# Patient Record
Sex: Female | Born: 1945 | Race: White | Hispanic: No | Marital: Married | Smoking: Former smoker
Health system: Southern US, Community
[De-identification: ages and names within clinical notes are randomized; demographics above are authoritative.]

## PROBLEM LIST (undated history)

## (undated) DIAGNOSIS — N83209 Unspecified ovarian cyst, unspecified side: Secondary | ICD-10-CM

## (undated) DIAGNOSIS — F419 Anxiety disorder, unspecified: Secondary | ICD-10-CM

## (undated) DIAGNOSIS — G919 Hydrocephalus, unspecified: Secondary | ICD-10-CM

## (undated) DIAGNOSIS — J45909 Unspecified asthma, uncomplicated: Secondary | ICD-10-CM

## (undated) DIAGNOSIS — F329 Major depressive disorder, single episode, unspecified: Secondary | ICD-10-CM

## (undated) DIAGNOSIS — K219 Gastro-esophageal reflux disease without esophagitis: Secondary | ICD-10-CM

## (undated) DIAGNOSIS — E079 Disorder of thyroid, unspecified: Secondary | ICD-10-CM

## (undated) DIAGNOSIS — M810 Age-related osteoporosis without current pathological fracture: Secondary | ICD-10-CM

## (undated) DIAGNOSIS — T7840XA Allergy, unspecified, initial encounter: Secondary | ICD-10-CM

## (undated) DIAGNOSIS — B009 Herpesviral infection, unspecified: Secondary | ICD-10-CM

## (undated) DIAGNOSIS — F32A Depression, unspecified: Secondary | ICD-10-CM

## (undated) DIAGNOSIS — R7989 Other specified abnormal findings of blood chemistry: Secondary | ICD-10-CM

## (undated) DIAGNOSIS — M1990 Unspecified osteoarthritis, unspecified site: Secondary | ICD-10-CM

## (undated) DIAGNOSIS — Z9889 Other specified postprocedural states: Secondary | ICD-10-CM

## (undated) DIAGNOSIS — L400 Psoriasis vulgaris: Secondary | ICD-10-CM

## (undated) DIAGNOSIS — B029 Zoster without complications: Secondary | ICD-10-CM

## (undated) DIAGNOSIS — D802 Selective deficiency of immunoglobulin A [IgA]: Secondary | ICD-10-CM

## (undated) DIAGNOSIS — E538 Deficiency of other specified B group vitamins: Secondary | ICD-10-CM

## (undated) HISTORY — DX: Herpesviral infection, unspecified: B00.9

## (undated) HISTORY — DX: Selective deficiency of immunoglobulin a (iga): D80.2

## (undated) HISTORY — DX: Other specified abnormal findings of blood chemistry: R79.89

## (undated) HISTORY — PX: BRAIN SURGERY: SHX531

## (undated) HISTORY — DX: Zoster without complications: B02.9

## (undated) HISTORY — DX: Unspecified osteoarthritis, unspecified site: M19.90

## (undated) HISTORY — DX: Anxiety disorder, unspecified: F41.9

## (undated) HISTORY — DX: Age-related osteoporosis without current pathological fracture: M81.0

## (undated) HISTORY — PX: OTHER SURGICAL HISTORY: SHX169

## (undated) HISTORY — PX: UPPER GASTROINTESTINAL ENDOSCOPY: SHX188

## (undated) HISTORY — DX: Depression, unspecified: F32.A

## (undated) HISTORY — PX: LUMBAR DISC SURGERY: SHX700

## (undated) HISTORY — DX: Gastro-esophageal reflux disease without esophagitis: K21.9

## (undated) HISTORY — DX: Hydrocephalus, unspecified: G91.9

## (undated) HISTORY — DX: Allergy, unspecified, initial encounter: T78.40XA

## (undated) HISTORY — DX: Disorder of thyroid, unspecified: E07.9

## (undated) HISTORY — PX: COLONOSCOPY: SHX174

## (undated) HISTORY — DX: Unspecified ovarian cyst, unspecified side: N83.209

## (undated) HISTORY — DX: Psoriasis vulgaris: L40.0

## (undated) HISTORY — DX: Other specified postprocedural states: Z98.890

## (undated) HISTORY — DX: Unspecified asthma, uncomplicated: J45.909

## (undated) HISTORY — DX: Major depressive disorder, single episode, unspecified: F32.9

## (undated) HISTORY — DX: Deficiency of other specified B group vitamins: E53.8

## (undated) MED FILL — Medication: Fill #0 | Status: CN

---

## 1997-08-24 HISTORY — PX: TOTAL ABDOMINAL HYSTERECTOMY W/ BILATERAL SALPINGOOPHORECTOMY: SHX83

## 1997-11-27 ENCOUNTER — Encounter: Admission: RE | Admit: 1997-11-27 | Discharge: 1997-11-27 | Payer: Self-pay | Admitting: Sports Medicine

## 1998-02-12 ENCOUNTER — Encounter: Admission: RE | Admit: 1998-02-12 | Discharge: 1998-02-12 | Payer: Self-pay | Admitting: Family Medicine

## 1998-04-24 ENCOUNTER — Encounter: Admission: RE | Admit: 1998-04-24 | Discharge: 1998-04-24 | Payer: Self-pay | Admitting: Family Medicine

## 1998-08-24 HISTORY — PX: THYROIDECTOMY: SHX17

## 1998-10-08 ENCOUNTER — Other Ambulatory Visit: Admission: RE | Admit: 1998-10-08 | Discharge: 1998-10-08 | Payer: Self-pay | Admitting: Obstetrics and Gynecology

## 1998-10-16 ENCOUNTER — Ambulatory Visit: Admission: RE | Admit: 1998-10-16 | Discharge: 1998-10-16 | Payer: Self-pay | Admitting: Gynecology

## 1999-04-03 ENCOUNTER — Encounter: Payer: Self-pay | Admitting: Gastroenterology

## 1999-05-02 ENCOUNTER — Encounter: Payer: Self-pay | Admitting: Gastroenterology

## 1999-05-02 ENCOUNTER — Ambulatory Visit (HOSPITAL_COMMUNITY): Admission: RE | Admit: 1999-05-02 | Discharge: 1999-05-02 | Payer: Self-pay | Admitting: Gastroenterology

## 1999-08-05 ENCOUNTER — Ambulatory Visit (HOSPITAL_COMMUNITY): Admission: RE | Admit: 1999-08-05 | Discharge: 1999-08-05 | Payer: Self-pay | Admitting: Surgery

## 1999-08-05 ENCOUNTER — Encounter: Payer: Self-pay | Admitting: Surgery

## 1999-08-05 ENCOUNTER — Encounter (INDEPENDENT_AMBULATORY_CARE_PROVIDER_SITE_OTHER): Payer: Self-pay | Admitting: Specialist

## 1999-08-19 ENCOUNTER — Encounter (INDEPENDENT_AMBULATORY_CARE_PROVIDER_SITE_OTHER): Payer: Self-pay | Admitting: *Deleted

## 1999-08-19 ENCOUNTER — Ambulatory Visit (HOSPITAL_COMMUNITY): Admission: RE | Admit: 1999-08-19 | Discharge: 1999-08-20 | Payer: Self-pay | Admitting: *Deleted

## 1999-09-15 ENCOUNTER — Encounter: Payer: Self-pay | Admitting: Family Medicine

## 1999-09-15 ENCOUNTER — Encounter: Admission: RE | Admit: 1999-09-15 | Discharge: 1999-09-15 | Payer: Self-pay | Admitting: Family Medicine

## 1999-11-10 ENCOUNTER — Other Ambulatory Visit: Admission: RE | Admit: 1999-11-10 | Discharge: 1999-11-10 | Payer: Self-pay | Admitting: Obstetrics and Gynecology

## 2000-02-18 ENCOUNTER — Encounter: Admission: RE | Admit: 2000-02-18 | Discharge: 2000-02-18 | Payer: Self-pay | Admitting: Family Medicine

## 2000-07-30 ENCOUNTER — Encounter: Admission: RE | Admit: 2000-07-30 | Discharge: 2000-07-30 | Payer: Self-pay | Admitting: Obstetrics and Gynecology

## 2000-07-30 ENCOUNTER — Encounter: Payer: Self-pay | Admitting: Obstetrics and Gynecology

## 2000-09-13 ENCOUNTER — Encounter: Admission: RE | Admit: 2000-09-13 | Discharge: 2000-09-13 | Payer: Self-pay | Admitting: Family Medicine

## 2000-09-20 ENCOUNTER — Encounter: Admission: RE | Admit: 2000-09-20 | Discharge: 2000-09-20 | Payer: Self-pay | Admitting: Family Medicine

## 2000-09-20 ENCOUNTER — Encounter: Payer: Self-pay | Admitting: Family Medicine

## 2000-10-21 ENCOUNTER — Encounter: Admission: RE | Admit: 2000-10-21 | Discharge: 2000-10-21 | Payer: Self-pay | Admitting: Family Medicine

## 2001-01-13 ENCOUNTER — Encounter: Admission: RE | Admit: 2001-01-13 | Discharge: 2001-01-13 | Payer: Self-pay | Admitting: Family Medicine

## 2001-04-20 ENCOUNTER — Ambulatory Visit (HOSPITAL_BASED_OUTPATIENT_CLINIC_OR_DEPARTMENT_OTHER): Admission: RE | Admit: 2001-04-20 | Discharge: 2001-04-21 | Payer: Self-pay | Admitting: *Deleted

## 2001-04-20 ENCOUNTER — Encounter: Payer: Self-pay | Admitting: *Deleted

## 2001-09-22 ENCOUNTER — Encounter: Payer: Self-pay | Admitting: Obstetrics and Gynecology

## 2001-09-22 ENCOUNTER — Encounter: Admission: RE | Admit: 2001-09-22 | Discharge: 2001-09-22 | Payer: Self-pay | Admitting: Obstetrics and Gynecology

## 2001-10-07 ENCOUNTER — Encounter: Payer: Self-pay | Admitting: Gastroenterology

## 2001-10-31 ENCOUNTER — Encounter: Payer: Self-pay | Admitting: Gastroenterology

## 2001-10-31 ENCOUNTER — Ambulatory Visit (HOSPITAL_COMMUNITY): Admission: RE | Admit: 2001-10-31 | Discharge: 2001-10-31 | Payer: Self-pay | Admitting: Gastroenterology

## 2002-02-10 ENCOUNTER — Encounter: Admission: RE | Admit: 2002-02-10 | Discharge: 2002-02-10 | Payer: Self-pay | Admitting: Family Medicine

## 2002-02-21 ENCOUNTER — Encounter: Payer: Self-pay | Admitting: Obstetrics and Gynecology

## 2002-02-21 ENCOUNTER — Encounter: Admission: RE | Admit: 2002-02-21 | Discharge: 2002-02-21 | Payer: Self-pay | Admitting: Obstetrics and Gynecology

## 2003-02-01 ENCOUNTER — Encounter: Admission: RE | Admit: 2003-02-01 | Discharge: 2003-02-01 | Payer: Self-pay | Admitting: Obstetrics and Gynecology

## 2003-02-01 ENCOUNTER — Encounter: Payer: Self-pay | Admitting: Obstetrics and Gynecology

## 2003-02-13 ENCOUNTER — Encounter: Admission: RE | Admit: 2003-02-13 | Discharge: 2003-02-13 | Payer: Self-pay | Admitting: Family Medicine

## 2004-02-28 ENCOUNTER — Encounter: Admission: RE | Admit: 2004-02-28 | Discharge: 2004-02-28 | Payer: Self-pay | Admitting: Family Medicine

## 2004-03-11 ENCOUNTER — Encounter: Admission: RE | Admit: 2004-03-11 | Discharge: 2004-03-11 | Payer: Self-pay | Admitting: Family Medicine

## 2004-03-12 ENCOUNTER — Encounter: Admission: RE | Admit: 2004-03-12 | Discharge: 2004-03-12 | Payer: Self-pay | Admitting: Obstetrics and Gynecology

## 2004-09-12 ENCOUNTER — Ambulatory Visit (HOSPITAL_COMMUNITY): Admission: RE | Admit: 2004-09-12 | Discharge: 2004-09-12 | Payer: Self-pay | Admitting: Gastroenterology

## 2004-09-12 ENCOUNTER — Encounter (INDEPENDENT_AMBULATORY_CARE_PROVIDER_SITE_OTHER): Payer: Self-pay | Admitting: *Deleted

## 2004-09-12 ENCOUNTER — Encounter: Payer: Self-pay | Admitting: Gastroenterology

## 2004-09-18 ENCOUNTER — Encounter: Admission: RE | Admit: 2004-09-18 | Discharge: 2004-09-18 | Payer: Self-pay | Admitting: Obstetrics and Gynecology

## 2005-04-03 ENCOUNTER — Encounter: Admission: RE | Admit: 2005-04-03 | Discharge: 2005-04-03 | Payer: Self-pay | Admitting: Obstetrics and Gynecology

## 2005-04-15 ENCOUNTER — Ambulatory Visit: Payer: Self-pay | Admitting: Family Medicine

## 2005-09-15 ENCOUNTER — Ambulatory Visit: Payer: Self-pay | Admitting: Family Medicine

## 2005-09-29 ENCOUNTER — Ambulatory Visit: Payer: Self-pay | Admitting: Family Medicine

## 2005-10-09 ENCOUNTER — Ambulatory Visit: Payer: Self-pay | Admitting: Internal Medicine

## 2005-10-28 ENCOUNTER — Ambulatory Visit: Payer: Self-pay | Admitting: Family Medicine

## 2005-12-21 ENCOUNTER — Ambulatory Visit: Payer: Self-pay | Admitting: Family Medicine

## 2006-03-02 ENCOUNTER — Ambulatory Visit: Payer: Self-pay | Admitting: Family Medicine

## 2006-03-25 ENCOUNTER — Ambulatory Visit: Payer: Self-pay | Admitting: Family Medicine

## 2006-03-31 ENCOUNTER — Ambulatory Visit: Payer: Self-pay | Admitting: Family Medicine

## 2006-04-05 ENCOUNTER — Encounter: Admission: RE | Admit: 2006-04-05 | Discharge: 2006-04-05 | Payer: Self-pay | Admitting: Family Medicine

## 2006-10-21 DIAGNOSIS — J45909 Unspecified asthma, uncomplicated: Secondary | ICD-10-CM | POA: Insufficient documentation

## 2006-10-21 DIAGNOSIS — M810 Age-related osteoporosis without current pathological fracture: Secondary | ICD-10-CM | POA: Insufficient documentation

## 2006-10-21 DIAGNOSIS — F32A Depression, unspecified: Secondary | ICD-10-CM | POA: Insufficient documentation

## 2006-10-21 DIAGNOSIS — M8580 Other specified disorders of bone density and structure, unspecified site: Secondary | ICD-10-CM | POA: Insufficient documentation

## 2006-10-21 DIAGNOSIS — K649 Unspecified hemorrhoids: Secondary | ICD-10-CM | POA: Insufficient documentation

## 2006-10-21 DIAGNOSIS — F329 Major depressive disorder, single episode, unspecified: Secondary | ICD-10-CM

## 2007-01-05 ENCOUNTER — Ambulatory Visit: Payer: Self-pay | Admitting: Internal Medicine

## 2007-01-24 ENCOUNTER — Ambulatory Visit: Payer: Self-pay | Admitting: Family Medicine

## 2007-02-04 ENCOUNTER — Ambulatory Visit: Payer: Self-pay | Admitting: Family Medicine

## 2007-03-18 ENCOUNTER — Ambulatory Visit: Payer: Self-pay | Admitting: Family Medicine

## 2007-03-18 LAB — CONVERTED CEMR LAB
ALT: 21 units/L (ref 0–35)
AST: 21 units/L (ref 0–37)
Albumin: 3.7 g/dL (ref 3.5–5.2)
Alkaline Phosphatase: 54 units/L (ref 39–117)
BUN: 10 mg/dL (ref 6–23)
Basophils Absolute: 0 10*3/uL (ref 0.0–0.1)
Basophils Relative: 0.7 % (ref 0.0–1.0)
Bilirubin Urine: NEGATIVE
Bilirubin, Direct: 0.1 mg/dL (ref 0.0–0.3)
Blood in Urine, dipstick: NEGATIVE
CO2: 33 meq/L — ABNORMAL HIGH (ref 19–32)
Calcium: 8.8 mg/dL (ref 8.4–10.5)
Chloride: 103 meq/L (ref 96–112)
Cholesterol: 172 mg/dL (ref 0–200)
Creatinine, Ser: 0.8 mg/dL (ref 0.4–1.2)
Eosinophils Absolute: 0.2 10*3/uL (ref 0.0–0.6)
Eosinophils Relative: 4.3 % (ref 0.0–5.0)
GFR calc Af Amer: 94 mL/min
GFR calc non Af Amer: 78 mL/min
Glucose, Bld: 89 mg/dL (ref 70–99)
Glucose, Urine, Semiquant: NEGATIVE
HCT: 38 % (ref 36.0–46.0)
HDL: 46.5 mg/dL (ref >39.0)
Hemoglobin: 13.3 g/dL (ref 12.0–15.0)
Ketones, urine, test strip: NEGATIVE
LDL Cholesterol: 95 mg/dL (ref 0–99)
Lymphocytes Relative: 26.6 % (ref 12.0–46.0)
MCHC: 35.1 g/dL (ref 30.0–36.0)
MCV: 95.3 fL (ref 78.0–100.0)
Monocytes Absolute: 0.8 10*3/uL — ABNORMAL HIGH (ref 0.2–0.7)
Monocytes Relative: 15.6 % — ABNORMAL HIGH (ref 3.0–11.0)
Neutro Abs: 2.7 10*3/uL (ref 1.4–7.7)
Neutrophils Relative %: 52.8 % (ref 43.0–77.0)
Nitrite: NEGATIVE
Platelets: 264 10*3/uL (ref 150–400)
Potassium: 4.2 meq/L (ref 3.5–5.1)
Protein, U semiquant: NEGATIVE
RBC: 3.98 M/uL (ref 3.87–5.11)
RDW: 12.7 % (ref 11.5–14.6)
Sodium: 140 meq/L (ref 135–145)
Specific Gravity, Urine: 1.015
TSH: 3.15 microintl units/mL (ref 0.35–5.50)
Total Bilirubin: 0.7 mg/dL (ref 0.3–1.2)
Total CHOL/HDL Ratio: 3.7
Total Protein: 6.2 g/dL (ref 6.0–8.3)
Triglycerides: 153 mg/dL — ABNORMAL HIGH (ref 0–149)
Urobilinogen, UA: NEGATIVE
VLDL: 31 mg/dL (ref 0–40)
WBC: 5.1 10*3/uL (ref 4.5–10.5)
pH: 7.5

## 2007-03-25 ENCOUNTER — Ambulatory Visit: Payer: Self-pay | Admitting: Family Medicine

## 2007-03-30 ENCOUNTER — Ambulatory Visit (HOSPITAL_BASED_OUTPATIENT_CLINIC_OR_DEPARTMENT_OTHER): Admission: RE | Admit: 2007-03-30 | Discharge: 2007-03-30 | Payer: Self-pay | Admitting: *Deleted

## 2007-04-11 ENCOUNTER — Encounter: Admission: RE | Admit: 2007-04-11 | Discharge: 2007-04-11 | Payer: Self-pay | Admitting: Obstetrics & Gynecology

## 2007-04-11 ENCOUNTER — Encounter: Payer: Self-pay | Admitting: Family Medicine

## 2007-08-24 ENCOUNTER — Ambulatory Visit: Payer: Self-pay | Admitting: Family Medicine

## 2007-09-05 ENCOUNTER — Ambulatory Visit: Payer: Self-pay | Admitting: Family Medicine

## 2007-09-05 DIAGNOSIS — J309 Allergic rhinitis, unspecified: Secondary | ICD-10-CM | POA: Insufficient documentation

## 2007-09-05 DIAGNOSIS — E039 Hypothyroidism, unspecified: Secondary | ICD-10-CM | POA: Insufficient documentation

## 2007-09-23 ENCOUNTER — Ambulatory Visit: Payer: Self-pay | Admitting: Family Medicine

## 2007-09-23 LAB — CONVERTED CEMR LAB
ALT: 29 units/L (ref 0–35)
AST: 24 units/L (ref 0–37)
Albumin: 3.7 g/dL (ref 3.5–5.2)
Alkaline Phosphatase: 53 units/L (ref 39–117)
Anti Nuclear Antibody(ANA): NEGATIVE
BUN: 12 mg/dL (ref 6–23)
Basophils Absolute: 0 10*3/uL (ref 0.0–0.1)
Basophils Relative: 0 % (ref 0.0–1.0)
Bilirubin, Direct: 0.2 mg/dL (ref 0.0–0.3)
CO2: 31 meq/L (ref 19–32)
Calcium: 9.2 mg/dL (ref 8.4–10.5)
Chloride: 101 meq/L (ref 96–112)
Creatinine, Ser: 0.7 mg/dL (ref 0.4–1.2)
Eosinophils Absolute: 0.3 10*3/uL (ref 0.0–0.6)
Eosinophils Relative: 2.3 % (ref 0.0–5.0)
GFR calc Af Amer: 110 mL/min
GFR calc non Af Amer: 91 mL/min
Glucose, Bld: 82 mg/dL (ref 70–99)
HCT: 37.7 % (ref 36.0–46.0)
Hemoglobin: 12.9 g/dL (ref 12.0–15.0)
Lymphocytes Relative: 8.3 % — ABNORMAL LOW (ref 12.0–46.0)
MCHC: 34.1 g/dL (ref 30.0–36.0)
MCV: 91.1 fL (ref 78.0–100.0)
Monocytes Absolute: 0.9 10*3/uL — ABNORMAL HIGH (ref 0.2–0.7)
Monocytes Relative: 7.9 % (ref 3.0–11.0)
Neutro Abs: 9.2 10*3/uL — ABNORMAL HIGH (ref 1.4–7.7)
Neutrophils Relative %: 81.5 % — ABNORMAL HIGH (ref 43.0–77.0)
Platelets: 261 10*3/uL (ref 150–400)
Potassium: 4.4 meq/L (ref 3.5–5.1)
RBC: 4.14 M/uL (ref 3.87–5.11)
RDW: 12.9 % (ref 11.5–14.6)
Rhuematoid fact SerPl-aCnc: <20 intl units/mL — ABNORMAL LOW (ref 0.0–20.0)
Sodium: 139 meq/L (ref 135–145)
TSH: 0.08 microintl units/mL — ABNORMAL LOW (ref 0.35–5.50)
Total Bilirubin: 0.8 mg/dL (ref 0.3–1.2)
Total CK: 79 units/L (ref 7–177)
Total Protein: 6 g/dL (ref 6.0–8.3)
WBC: 11.3 10*3/uL — ABNORMAL HIGH (ref 4.5–10.5)

## 2007-09-30 ENCOUNTER — Ambulatory Visit: Payer: Self-pay | Admitting: Family Medicine

## 2007-10-03 ENCOUNTER — Encounter: Payer: Self-pay | Admitting: Family Medicine

## 2007-11-21 ENCOUNTER — Ambulatory Visit: Payer: Self-pay | Admitting: Family Medicine

## 2007-11-28 ENCOUNTER — Ambulatory Visit: Payer: Self-pay | Admitting: Family Medicine

## 2007-11-30 LAB — CONVERTED CEMR LAB: TSH: 0.85 microintl units/mL (ref 0.35–5.50)

## 2008-03-26 ENCOUNTER — Ambulatory Visit: Payer: Self-pay | Admitting: Family Medicine

## 2008-03-26 LAB — CONVERTED CEMR LAB
ALT: 18 units/L (ref 0–35)
AST: 22 units/L (ref 0–37)
Albumin: 4.2 g/dL (ref 3.5–5.2)
Alkaline Phosphatase: 74 units/L (ref 39–117)
BUN: 11 mg/dL (ref 6–23)
Basophils Absolute: 0 10*3/uL (ref 0.0–0.1)
Basophils Relative: 0.9 % (ref 0.0–3.0)
Bilirubin Urine: NEGATIVE
Bilirubin, Direct: 0.1 mg/dL (ref 0.0–0.3)
CO2: 32 meq/L (ref 19–32)
Calcium: 9.3 mg/dL (ref 8.4–10.5)
Chloride: 102 meq/L (ref 96–112)
Cholesterol: 202 mg/dL (ref 0–200)
Creatinine, Ser: 0.9 mg/dL (ref 0.4–1.2)
Direct LDL: 110.8 mg/dL
Eosinophils Absolute: 0.3 10*3/uL (ref 0.0–0.7)
Eosinophils Relative: 6.2 % — ABNORMAL HIGH (ref 0.0–5.0)
GFR calc Af Amer: 82 mL/min
GFR calc non Af Amer: 68 mL/min
Glucose, Bld: 94 mg/dL (ref 70–99)
Glucose, Urine, Semiquant: NEGATIVE
HCT: 40.5 % (ref 36.0–46.0)
HDL: 71.2 mg/dL (ref >39.0)
Hemoglobin: 14.2 g/dL (ref 12.0–15.0)
Ketones, urine, test strip: NEGATIVE
Lymphocytes Relative: 20.4 % (ref 12.0–46.0)
MCHC: 35.2 g/dL (ref 30.0–36.0)
MCV: 93.5 fL (ref 78.0–100.0)
Monocytes Absolute: 0.5 10*3/uL (ref 0.1–1.0)
Monocytes Relative: 10.6 % (ref 3.0–12.0)
Neutro Abs: 2.9 10*3/uL (ref 1.4–7.7)
Neutrophils Relative %: 61.9 % (ref 43.0–77.0)
Nitrite: NEGATIVE
Platelets: 280 10*3/uL (ref 150–400)
Potassium: 4.2 meq/L (ref 3.5–5.1)
Protein, U semiquant: NEGATIVE
RBC: 4.33 M/uL (ref 3.87–5.11)
RDW: 12.7 % (ref 11.5–14.6)
Sodium: 138 meq/L (ref 135–145)
Specific Gravity, Urine: 1.015
TSH: 0.74 microintl units/mL (ref 0.35–5.50)
Total Bilirubin: 1 mg/dL (ref 0.3–1.2)
Total CHOL/HDL Ratio: 2.8
Total Protein: 7.1 g/dL (ref 6.0–8.3)
Triglycerides: 60 mg/dL (ref 0–149)
Urobilinogen, UA: 0.2
VLDL: 12 mg/dL (ref 0–40)
WBC: 4.6 10*3/uL (ref 4.5–10.5)
pH: 6.5

## 2008-04-02 ENCOUNTER — Encounter: Payer: Self-pay | Admitting: Family Medicine

## 2008-04-03 ENCOUNTER — Ambulatory Visit: Payer: Self-pay | Admitting: Family Medicine

## 2008-04-18 ENCOUNTER — Encounter: Admission: RE | Admit: 2008-04-18 | Discharge: 2008-04-18 | Payer: Self-pay | Admitting: Obstetrics and Gynecology

## 2008-08-13 ENCOUNTER — Ambulatory Visit: Payer: Self-pay | Admitting: Family Medicine

## 2008-10-26 ENCOUNTER — Telehealth: Payer: Self-pay | Admitting: Internal Medicine

## 2008-11-09 ENCOUNTER — Telehealth: Payer: Self-pay | Admitting: Family Medicine

## 2008-11-13 ENCOUNTER — Ambulatory Visit: Payer: Self-pay | Admitting: Family Medicine

## 2008-11-13 LAB — CONVERTED CEMR LAB
ALT: 16 units/L (ref 0–35)
ANA Titer 1: NEGATIVE
AST: 21 units/L (ref 0–37)
Albumin: 3.9 g/dL (ref 3.5–5.2)
Alkaline Phosphatase: 69 units/L (ref 39–117)
Anti Nuclear Antibody(ANA): POSITIVE — AB
BUN: 13 mg/dL (ref 6–23)
Basophils Absolute: 0 10*3/uL (ref 0.0–0.1)
Basophils Relative: 0.6 % (ref 0.0–3.0)
Bilirubin, Direct: 0 mg/dL (ref 0.0–0.3)
CO2: 30 meq/L (ref 19–32)
Calcium: 8.8 mg/dL (ref 8.4–10.5)
Chloride: 108 meq/L (ref 96–112)
Creatinine, Ser: 0.8 mg/dL (ref 0.4–1.2)
Cyclic Citrullin Peptide Ab: 0.5 units (ref <7)
Eosinophils Absolute: 0.3 10*3/uL (ref 0.0–0.7)
Eosinophils Relative: 4.1 % (ref 0.0–5.0)
GFR calc non Af Amer: 77.58 mL/min (ref >60)
Glucose, Bld: 76 mg/dL (ref 70–99)
HCT: 39.1 % (ref 36.0–46.0)
Hemoglobin: 13.5 g/dL (ref 12.0–15.0)
Lymphocytes Relative: 17 % (ref 12.0–46.0)
Lymphs Abs: 1.1 10*3/uL (ref 0.7–4.0)
MCHC: 34.6 g/dL (ref 30.0–36.0)
MCV: 94.4 fL (ref 78.0–100.0)
Monocytes Absolute: 0.6 10*3/uL (ref 0.1–1.0)
Monocytes Relative: 8.9 % (ref 3.0–12.0)
Neutro Abs: 4.6 10*3/uL (ref 1.4–7.7)
Neutrophils Relative %: 69.4 % (ref 43.0–77.0)
Platelets: 257 10*3/uL (ref 150.0–400.0)
Potassium: 4.1 meq/L (ref 3.5–5.1)
RBC: 4.14 M/uL (ref 3.87–5.11)
RDW: 12.2 % (ref 11.5–14.6)
Rhuematoid fact SerPl-aCnc: <20 intl units/mL (ref 0.0–20.0)
Sed Rate: 13 mm/hr (ref 0–22)
Sodium: 142 meq/L (ref 135–145)
TSH: 0.12 microintl units/mL — ABNORMAL LOW (ref 0.35–5.50)
Total Bilirubin: 0.6 mg/dL (ref 0.3–1.2)
Total Protein: 6.5 g/dL (ref 6.0–8.3)
Uric Acid, Serum: 3.6 mg/dL (ref 2.4–7.0)
WBC: 6.6 10*3/uL (ref 4.5–10.5)

## 2008-11-16 ENCOUNTER — Ambulatory Visit: Payer: Self-pay | Admitting: Family Medicine

## 2008-12-27 ENCOUNTER — Ambulatory Visit: Payer: Self-pay | Admitting: Family Medicine

## 2008-12-27 LAB — CONVERTED CEMR LAB: TSH: 1.92 microintl units/mL (ref 0.35–5.50)

## 2009-01-16 ENCOUNTER — Encounter: Payer: Self-pay | Admitting: Family Medicine

## 2009-03-28 ENCOUNTER — Ambulatory Visit: Payer: Self-pay | Admitting: Family Medicine

## 2009-03-28 ENCOUNTER — Encounter: Payer: Self-pay | Admitting: Family Medicine

## 2009-03-28 ENCOUNTER — Telehealth: Payer: Self-pay | Admitting: *Deleted

## 2009-03-28 DIAGNOSIS — E049 Nontoxic goiter, unspecified: Secondary | ICD-10-CM | POA: Insufficient documentation

## 2009-03-29 LAB — CONVERTED CEMR LAB
ALT: 14 units/L (ref 0–35)
AST: 19 units/L (ref 0–37)
Albumin: 4.1 g/dL (ref 3.5–5.2)
Alkaline Phosphatase: 58 units/L (ref 39–117)
BUN: 15 mg/dL (ref 6–23)
Basophils Absolute: 0 10*3/uL (ref 0.0–0.1)
Basophils Relative: 0.4 % (ref 0.0–3.0)
Bilirubin, Direct: 0 mg/dL (ref 0.0–0.3)
CO2: 30 meq/L (ref 19–32)
Calcium: 9.5 mg/dL (ref 8.4–10.5)
Chloride: 101 meq/L (ref 96–112)
Cholesterol: 190 mg/dL (ref 0–200)
Creatinine, Ser: 0.8 mg/dL (ref 0.4–1.2)
Eosinophils Absolute: 0.3 10*3/uL (ref 0.0–0.7)
Eosinophils Relative: 6.3 % — ABNORMAL HIGH (ref 0.0–5.0)
Free T4: 0.8 ng/dL (ref 0.6–1.6)
GFR calc non Af Amer: 77.48 mL/min (ref >60)
Glucose, Bld: 93 mg/dL (ref 70–99)
HCT: 39.2 % (ref 36.0–46.0)
HDL: 61.6 mg/dL (ref >39.00)
Hemoglobin: 14 g/dL (ref 12.0–15.0)
LDL Cholesterol: 113 mg/dL — ABNORMAL HIGH (ref 0–99)
Lymphocytes Relative: 23.9 % (ref 12.0–46.0)
Lymphs Abs: 1.1 10*3/uL (ref 0.7–4.0)
MCHC: 35.7 g/dL (ref 30.0–36.0)
MCV: 95.1 fL (ref 78.0–100.0)
Monocytes Absolute: 0.6 10*3/uL (ref 0.1–1.0)
Monocytes Relative: 12.7 % — ABNORMAL HIGH (ref 3.0–12.0)
Neutro Abs: 2.8 10*3/uL (ref 1.4–7.7)
Neutrophils Relative %: 56.7 % (ref 43.0–77.0)
Platelets: 236 10*3/uL (ref 150.0–400.0)
Potassium: 5.2 meq/L — ABNORMAL HIGH (ref 3.5–5.1)
RBC: 4.12 M/uL (ref 3.87–5.11)
RDW: 12.2 % (ref 11.5–14.6)
Sodium: 139 meq/L (ref 135–145)
T3, Free: 2.4 pg/mL (ref 2.3–4.2)
TSH: 1.25 microintl units/mL (ref 0.35–5.50)
Total Bilirubin: 0.8 mg/dL (ref 0.3–1.2)
Total CHOL/HDL Ratio: 3
Total Protein: 7.1 g/dL (ref 6.0–8.3)
Triglycerides: 77 mg/dL (ref 0.0–149.0)
VLDL: 15.4 mg/dL (ref 0.0–40.0)
WBC: 4.8 10*3/uL (ref 4.5–10.5)

## 2009-04-01 ENCOUNTER — Encounter: Admission: RE | Admit: 2009-04-01 | Discharge: 2009-04-01 | Payer: Self-pay | Admitting: Family Medicine

## 2009-04-02 ENCOUNTER — Telehealth: Payer: Self-pay | Admitting: Family Medicine

## 2009-04-18 ENCOUNTER — Ambulatory Visit: Payer: Self-pay | Admitting: Family Medicine

## 2009-05-10 ENCOUNTER — Encounter: Admission: RE | Admit: 2009-05-10 | Discharge: 2009-05-10 | Payer: Self-pay | Admitting: Family Medicine

## 2009-05-13 ENCOUNTER — Telehealth: Payer: Self-pay | Admitting: *Deleted

## 2009-06-20 ENCOUNTER — Ambulatory Visit: Payer: Self-pay | Admitting: Family Medicine

## 2009-06-20 LAB — CONVERTED CEMR LAB
Bilirubin Urine: NEGATIVE
Glucose, Urine, Semiquant: NEGATIVE
Ketones, urine, test strip: NEGATIVE
Nitrite: NEGATIVE
Protein, U semiquant: NEGATIVE
Specific Gravity, Urine: <1.005
Urobilinogen, UA: 0.2
pH: 5

## 2009-06-21 ENCOUNTER — Encounter: Payer: Self-pay | Admitting: Family Medicine

## 2009-10-02 ENCOUNTER — Encounter: Payer: Self-pay | Admitting: Gastroenterology

## 2009-10-18 ENCOUNTER — Ambulatory Visit: Payer: Self-pay | Admitting: Internal Medicine

## 2009-10-18 DIAGNOSIS — D802 Selective deficiency of immunoglobulin A [IgA]: Secondary | ICD-10-CM | POA: Insufficient documentation

## 2010-02-25 ENCOUNTER — Encounter (INDEPENDENT_AMBULATORY_CARE_PROVIDER_SITE_OTHER): Payer: Self-pay | Admitting: *Deleted

## 2010-03-04 ENCOUNTER — Ambulatory Visit: Payer: Self-pay | Admitting: Gastroenterology

## 2010-03-04 ENCOUNTER — Encounter (INDEPENDENT_AMBULATORY_CARE_PROVIDER_SITE_OTHER): Payer: Self-pay | Admitting: *Deleted

## 2010-03-04 DIAGNOSIS — K219 Gastro-esophageal reflux disease without esophagitis: Secondary | ICD-10-CM | POA: Insufficient documentation

## 2010-03-05 DIAGNOSIS — E538 Deficiency of other specified B group vitamins: Secondary | ICD-10-CM | POA: Insufficient documentation

## 2010-03-05 LAB — CONVERTED CEMR LAB
ALT: 14 units/L (ref 0–35)
AST: 20 units/L (ref 0–37)
Albumin: 4 g/dL (ref 3.5–5.2)
Alkaline Phosphatase: 62 units/L (ref 39–117)
BUN: 15 mg/dL (ref 6–23)
Basophils Absolute: 0 10*3/uL (ref 0.0–0.1)
Basophils Relative: 0.5 % (ref 0.0–3.0)
Bilirubin, Direct: 0.1 mg/dL (ref 0.0–0.3)
CO2: 30 meq/L (ref 19–32)
Calcium: 9.3 mg/dL (ref 8.4–10.5)
Chloride: 103 meq/L (ref 96–112)
Creatinine, Ser: 0.8 mg/dL (ref 0.4–1.2)
Eosinophils Absolute: 0.5 10*3/uL (ref 0.0–0.7)
Eosinophils Relative: 8.6 % — ABNORMAL HIGH (ref 0.0–5.0)
Ferritin: 48.1 ng/mL (ref 10.0–291.0)
Folate: 17.7 ng/mL
GFR calc non Af Amer: 74.03 mL/min (ref >60)
Glucose, Bld: 73 mg/dL (ref 70–99)
HCT: 41.9 % (ref 36.0–46.0)
Hemoglobin: 14.4 g/dL (ref 12.0–15.0)
Iron: 118 ug/dL (ref 42–145)
Lymphocytes Relative: 14.6 % (ref 12.0–46.0)
Lymphs Abs: 0.9 10*3/uL (ref 0.7–4.0)
MCHC: 34.3 g/dL (ref 30.0–36.0)
MCV: 95.1 fL (ref 78.0–100.0)
Monocytes Absolute: 0.7 10*3/uL (ref 0.1–1.0)
Monocytes Relative: 11 % (ref 3.0–12.0)
Neutro Abs: 3.9 10*3/uL (ref 1.4–7.7)
Neutrophils Relative %: 65.3 % (ref 43.0–77.0)
Platelets: 254 10*3/uL (ref 150.0–400.0)
Potassium: 4.7 meq/L (ref 3.5–5.1)
RBC: 4.41 M/uL (ref 3.87–5.11)
RDW: 13 % (ref 11.5–14.6)
Saturation Ratios: 29.9 % (ref 20.0–50.0)
Sodium: 138 meq/L (ref 135–145)
TSH: 1.03 microintl units/mL (ref 0.35–5.50)
Total Bilirubin: 0.6 mg/dL (ref 0.3–1.2)
Total Protein: 6.9 g/dL (ref 6.0–8.3)
Transferrin: 281.6 mg/dL (ref 212.0–360.0)
Vitamin B-12: 217 pg/mL (ref 211–911)
WBC: 6 10*3/uL (ref 4.5–10.5)

## 2010-03-06 ENCOUNTER — Telehealth: Payer: Self-pay | Admitting: Gastroenterology

## 2010-03-06 ENCOUNTER — Ambulatory Visit (HOSPITAL_COMMUNITY): Admission: RE | Admit: 2010-03-06 | Discharge: 2010-03-06 | Payer: Self-pay | Admitting: Gastroenterology

## 2010-03-06 ENCOUNTER — Ambulatory Visit: Payer: Self-pay | Admitting: Gastroenterology

## 2010-03-06 LAB — CONVERTED CEMR LAB
IgA: <7 mg/dL — ABNORMAL LOW (ref 68–378)
Tissue Transglutaminase Ab, IgA: 1 units (ref <20)

## 2010-03-07 ENCOUNTER — Ambulatory Visit: Payer: Self-pay | Admitting: Gastroenterology

## 2010-03-07 LAB — CONVERTED CEMR LAB: UREASE: NEGATIVE

## 2010-03-10 ENCOUNTER — Telehealth: Payer: Self-pay | Admitting: Gastroenterology

## 2010-03-12 ENCOUNTER — Encounter: Payer: Self-pay | Admitting: Gastroenterology

## 2010-03-13 ENCOUNTER — Ambulatory Visit: Payer: Self-pay | Admitting: Gastroenterology

## 2010-03-20 ENCOUNTER — Ambulatory Visit: Payer: Self-pay | Admitting: Gastroenterology

## 2010-03-24 ENCOUNTER — Ambulatory Visit (HOSPITAL_COMMUNITY): Admission: RE | Admit: 2010-03-24 | Discharge: 2010-03-24 | Payer: Self-pay | Admitting: Gastroenterology

## 2010-03-27 ENCOUNTER — Telehealth: Payer: Self-pay | Admitting: Gastroenterology

## 2010-04-01 ENCOUNTER — Ambulatory Visit: Payer: Self-pay | Admitting: Gastroenterology

## 2010-04-03 ENCOUNTER — Ambulatory Visit: Payer: Self-pay | Admitting: Family Medicine

## 2010-04-03 DIAGNOSIS — L821 Other seborrheic keratosis: Secondary | ICD-10-CM | POA: Insufficient documentation

## 2010-04-03 LAB — CONVERTED CEMR LAB
Cholesterol: 164 mg/dL (ref 0–200)
HDL: 62.1 mg/dL (ref >39.00)
LDL Cholesterol: 94 mg/dL (ref 0–99)
Total CHOL/HDL Ratio: 3
Triglycerides: 38 mg/dL (ref 0.0–149.0)
VLDL: 7.6 mg/dL (ref 0.0–40.0)

## 2010-04-04 ENCOUNTER — Telehealth: Payer: Self-pay | Admitting: Gastroenterology

## 2010-04-04 ENCOUNTER — Ambulatory Visit: Payer: Self-pay | Admitting: Gastroenterology

## 2010-04-21 ENCOUNTER — Ambulatory Visit: Payer: Self-pay | Admitting: Family Medicine

## 2010-05-21 ENCOUNTER — Telehealth: Payer: Self-pay | Admitting: Gastroenterology

## 2010-06-05 ENCOUNTER — Ambulatory Visit: Payer: Self-pay | Admitting: Family Medicine

## 2010-06-09 ENCOUNTER — Encounter: Payer: Self-pay | Admitting: Gastroenterology

## 2010-07-01 ENCOUNTER — Telehealth: Payer: Self-pay | Admitting: Gastroenterology

## 2010-07-03 ENCOUNTER — Ambulatory Visit: Payer: Self-pay | Admitting: Gastroenterology

## 2010-07-09 ENCOUNTER — Ambulatory Visit (HOSPITAL_COMMUNITY): Admission: RE | Admit: 2010-07-09 | Discharge: 2010-07-09 | Payer: Self-pay | Admitting: Gastroenterology

## 2010-07-09 DIAGNOSIS — K449 Diaphragmatic hernia without obstruction or gangrene: Secondary | ICD-10-CM | POA: Insufficient documentation

## 2010-07-25 ENCOUNTER — Encounter: Admission: RE | Admit: 2010-07-25 | Discharge: 2010-07-25 | Payer: Self-pay | Admitting: Obstetrics & Gynecology

## 2010-07-31 ENCOUNTER — Ambulatory Visit: Payer: Self-pay | Admitting: Family Medicine

## 2010-07-31 ENCOUNTER — Encounter: Payer: Self-pay | Admitting: Family Medicine

## 2010-08-05 ENCOUNTER — Encounter
Admission: RE | Admit: 2010-08-05 | Discharge: 2010-08-05 | Payer: Self-pay | Source: Home / Self Care | Attending: Obstetrics & Gynecology | Admitting: Obstetrics & Gynecology

## 2010-08-08 ENCOUNTER — Encounter: Payer: Self-pay | Admitting: Gastroenterology

## 2010-08-12 ENCOUNTER — Encounter
Admission: RE | Admit: 2010-08-12 | Discharge: 2010-08-12 | Payer: Self-pay | Source: Home / Self Care | Attending: Surgery | Admitting: Surgery

## 2010-08-27 ENCOUNTER — Encounter
Admission: RE | Admit: 2010-08-27 | Discharge: 2010-08-27 | Payer: Self-pay | Source: Home / Self Care | Attending: Surgery | Admitting: Surgery

## 2010-09-12 ENCOUNTER — Encounter: Payer: Self-pay | Admitting: Gastroenterology

## 2010-09-14 ENCOUNTER — Encounter: Payer: Self-pay | Admitting: Obstetrics & Gynecology

## 2010-09-23 NOTE — Progress Notes (Signed)
Summary: Wants results of U/S thyroid  Phone Note Call from Patient Call back at [phone]   Caller: live Call For: Kagan Mutchler Summary of Call: Patient requesting U/S results of her thyroid. Patient can be reached at [phone]. Called again for results.  Rudy Jew, RN  April 02, 2009 12:26 PM  Initial call taken by: Darra Lis RMA,  April 02, 2009 10:14 AM    I called Wadie Lessen explained.  I do not have an explanation for her symptoms based on her thyroid ultrasound referred to Dr. Narda Bonds.  ENT for further evaluation

## 2010-09-23 NOTE — Medication Information (Signed)
Summary: Nexium approved/[employer]  Nexium approved/[employer]   Imported By: Sherian Rein 06/16/2010 07:43:34  _____________________________________________________________________  External Attachment:    Type:   Image     Comment:   External Document

## 2010-09-23 NOTE — Progress Notes (Signed)
Summary: Ultrasound results  Phone Note Call from Patient Call back at Home Phone [phone]   Caller: Patient Call For: Dr. Jarold Motto Reason for Call: Lab or Test Results Summary of Call: Would like her Ultrasound Results Initial call taken by: Karna Christmas,  March 06, 2010 2:26 PM  Follow-up for Phone Call        Pt notified of [place] results. Follow-up by: Ashok Cordia RN,  March 06, 2010 2:59 PM

## 2010-09-23 NOTE — Procedures (Signed)
Summary: manometry   Esophageal Manometry  Procedure date:  03/24/2010  Findings:      normal:   Esophageal manometry report:  #1 upper esophageal sphincter-normal coordination between pharyngeal contraction and cricopharyngeal relaxation.  #2 lower esophageal sphincter-borderline lower esophageal sphincter incompetency with pressure of 15 mmHg.  #3 motility pattern-than normally propagated peristaltic waves of normal amplitude and duration throughout the length of the esophagus. Mean amplitude of distal esophageal waves is 86 mmHg.  24-hour pH probe test  24 pH probe today shows increased acid reflux in the recumbent position with total time the pH is less than 4 at  8.2% normal less than 1.2%. DeMeester score is elevated at 43.7 normal less than 22. There is good coordination between her postprandial symptoms and her complaints of belching. There appeared to be no significant proximal acid reflux or significant reflux in the upright position.  Assessment: The studies are consistent with chronic gastroesophageal reflux disease and associated nausea, coughing, and belching. She should respond well acid reflux suppressive therapy. If surgery is indicated, there is no evidence of an esophageal motility disorder that would contradict fundoplication.

## 2010-09-23 NOTE — Progress Notes (Signed)
Summary: Having stomach problems  Phone Note Call from Patient Call back at Home Phone [phone]   Call For: Dr Jarold Motto Summary of Call: Stomach Problems wants to make sure she is doing everything she is supposed to  Initial call taken by: Leanor Kail [facility],  March 27, 2010 12:42 PM  Follow-up for Phone Call        Pt is having alot of abd complaints.  Bloating, nausea, constipation.  She had manometry 24 hr ph done on 03/24/10.  Currently toking nexium and carafate.  Has rx for levsin but has not been using.  Pt asks if she could have a parasite.  No stool studies have been done.  Pt encouraged to use levsin as needed.  We will check with Dr. Jarold Motto re need for stool check for parasites.  Pt states she has traveled out of the country,. Follow-up by: Ashok Cordia RN,  March 27, 2010 2:13 PM  Additional Follow-up for Phone Call Additional follow up Details #1::        stoolO/P is fine.... Additional Follow-up by: Mardella Layman MD Clementeen Graham,  March 31, 2010 12:39 PM    Additional Follow-up for Phone Call Additional follow up Details #2::    LM for pt to call.  Lupita Leash Surface RN  March 31, 2010 1:57 PM  Pt notified.  Will stop by for instructions. Follow-up by: Ashok Cordia RN,  March 31, 2010 4:38 PM   Appended Document: Having stomach problems    Clinical Lists Changes  Problems: Added new problem of NAUSEA (ICD-787.02) Added new problem of CONSTIPATION (ICD-564.00)

## 2010-09-23 NOTE — Assessment & Plan Note (Signed)
Summary: sore throat - rv   Vital Signs:  Patient profile:   65 year old female Height:      60 inches Weight:      121.5 pounds BMI:     23.81 Temp:     98.5 degrees F oral  Vitals Entered By: Kern Reap CMA Duncan Dull) (March 28, 2009 12:26 PM)

## 2010-09-23 NOTE — Progress Notes (Signed)
Summary: abd discomfort  Phone Note Call from Patient Call back at Home Phone [phone]   Caller: Patient Call For: Dr. Jarold Motto Reason for Call: Talk to Nurse Summary of Call: abd discomfort has not improved... belching, nausea Initial call taken by: Vallarie Mare,  May 21, 2010 4:41 PM  Follow-up for Phone Call        Left message for patient to call back Darcey Nora RN, [facility]  May 22, 2010 9:19 AM   Patient is having episodic issues with belching and gas.  She says the levsin helps.  Patient instructed to maintain anti-reflux diet. Advised to avoid caffeine, mint, citrus foods/juices, tomatoes, chocolate. Instructed not to eat within 2 hours of exercise or bed, small meals are better than 3 large. Need to take PPI 30 minutes prior to 1st meal of the day. Continue to stay on Levsin.  She is asked to call back to schedule an appointment with Dr Jarold Motto if she continues to have problems. Follow-up by: Darcey Nora RN, CGRN,  May 22, 2010 11:10 AM

## 2010-09-23 NOTE — Assessment & Plan Note (Signed)
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  Mild depression, allergic rhinitis, sleep dysfunction.  All her chronic problems are stable except we recently increased her Zoloft to 100 mg a day.  Three weeks ago because she was having symptoms of decreased mood, crying easily, et Karie Soda.  Since we did increase the dose.  She feels back to normal.  She gets routine eye care.  Dental care does BSE monthly.  Continue mammography has had a colonoscopy in GI.  She's had her uterus removed.  Gets a pelvic exam by GYN.  Tetanus posterior 2004 pneumonia vaccine 2008.  Allergies: 1)  ! * Sulfa,teracycline,codeine 2)  ! * Clarithromycin 3)  ! Tetracycline 4)  ! Sulfa  Past History:  Past medical, surgical, family and social histories (including risk factors) reviewed, and no changes noted (except as noted below).  Past Medical History: Reviewed history from 08/13/2008 and no changes required. follicular adenoma R lobe thyroid, Herpes simplex labialis hypothyroidism osteo- arthritis depressionallergic rhinitis sleep dysfunction hysterectomy 15 years ago for nonmalignant reasons.  Ovaries were left intact IGg def  Past Surgical History: Reviewed history from 10/21/2006 and no changes required. 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 Breast CA - mother, colon cancer, IgA deficienccy-father, MI age 19 mother, MA age 65, osteoporosis - mother, RA - mother  pseudogout  Social History: Reviewed history from 09/05/2007 and no changes required. Occupation:  Runner, broadcasting/film/video Divorced Never Smoked Alcohol use-no Drug use-no Regular exercise-yes  Review of Systems      See HPI  Physical Exam  General:  Well-developed,well-nourished,in no acute distress; alert,appropriate and cooperative throughout examination Head:  Normocephalic and atraumatic without obvious abnormalities. No apparent alopecia or balding. Eyes:  No corneal or conjunctival inflammation noted. EOMI. Perrla. Funduscopic exam benign, without hemorrhages, exudates or papilledema. Vision grossly normal. Ears:  External ear exam shows no significant lesions or deformities.  Otoscopic examination reveals clear canals, tympanic membranes are intact bilaterally without bulging, retraction, inflammation or discharge. Hearing is grossly normal bilaterally. Nose:  External nasal examination shows no deformity or inflammation. Nasal mucosa are pink and moist without lesions or exudates. Mouth:  Oral mucosa and oropharynx without lesions or exudates.  Teeth in good repair. Neck:  No deformities, masses, or tenderness noted. Chest Wall:  No deformities, masses, or tenderness noted. Breasts:  No mass, nodules, thickening, tenderness, bulging, retraction, inflamation, nipple discharge or skin changes noted.   Lungs:  Normal respiratory effort, chest expands symmetrically. Lungs are clear to auscultation, no crackles or wheezes. Heart:  Normal rate and regular rhythm. S1 and S2 normal without gallop, murmur, click, rub or other extra sounds. Abdomen:  Bowel sounds positive,abdomen soft and non-tender without masses, organomegaly or hernias noted. Msk:  No deformity or scoliosis noted  of thoracic or lumbar spine.   Pulses:  R and L carotid,radial,femoral,dorsalis pedis and  posterior tibial pulses are full and equal bilaterally Extremities:  No clubbing, cyanosis, edema, or deformity noted with normal full range of motion of all joints.   Neurologic:  No cranial nerve deficits noted. Station and gait are normal. Plantar reflexes are down-going bilaterally. DTRs are symmetrical throughout. Sensory, motor and coordinative functions appear intact. Skin:  Intact without suspicious lesions or rashes Cervical Nodes:  No lymphadenopathy noted Axillary Nodes:  No palpable lymphadenopathy Inguinal Nodes:  No significant adenopathy Psych:  Cognition and judgment appear intact. Alert and cooperative with normal attention span and concentration. No apparent delusions, illusions, hallucinations   Impression & Recommendations:  Problem # 1:  GOITER, UNSPECIFIED (ICD-240.9) Assessment Unchanged  Problem # 2:  HEALTH MAINTENANCE EXAM (ICD-V70.0) Assessment: Comment Only  Problem # 3:  UNSPECIFIED HYPOTHYROIDISM (ICD-244.9) Assessment: Improved  The following medications were removed from the medication list:    Levothroid 100 Mcg Tabs (Levothyroxine sodium) ..... Once daily    Synthroid 100 Mcg Tabs (Levothyroxine sodium) .Marland Kitchen... Take one tab once daily Her updated medication list for this problem includes:    Synthroid 75 Mcg Tabs (Levothyroxine sodium) .Marland Kitchen... Take 1 tablet by mouth every morning  Problem # 4:  DEPRESSIVE DISORDER NOT ELSEWHERE CLASSIFIED (ICD-311) Assessment: Improved  Her updated medication list for this problem includes:    Trazodone Hcl 150 Mg Tabs (Trazodone hcl) .Marland Kitchen... 1/2 tab at bedtime as needed    Sertraline Hcl 100 Mg Tabs (Sertraline hcl) .Marland Kitchen... 1 tab @ bedtime  Complete Medication List: 1)  Trazodone Hcl 150 Mg Tabs (Trazodone hcl) .... 1/2 tab at bedtime as needed 2)  Sertraline Hcl 100 Mg Tabs (Sertraline hcl) .Marland Kitchen.. 1 tab @ bedtime 3)  Singulair 10 Mg Tabs (Montelukast sodium) .... Take 1 tablet by mouth once a day 4)  Baby Aspirin 81 Mg  Chew (Aspirin) .... Once daily 5)  Multivitamins Tabs (Multiple vitamin) .... Once daily 6)  Calcium 600 1500 Mg Tabs (Calcium carbonate) .... Two times a day 7)  Vitamin D 16109 Unit Caps (Ergocalciferol) .... Take one tab every 2 weeks 8)  Flonase 50 Mcg/act Susp (Fluticasone propionate) .... As needed 9)  Synthroid 75 Mcg Tabs (Levothyroxine sodium) .... Take 1 tablet by mouth every morning  Other Orders: Prescription Created Electronically [id number])  Patient Instructions: 1)  Please schedule a follow-up appointment in 1 year. 2)  It is important that you exercise regularly at least 20 minutes 5 times a week. If you develop chest pain, have severe difficulty breathing, or feel very tired , stop exercising immediately and seek medical attention. 3)  Schedule your mammogram. 4)  Schedule a colonoscopy/sigmoidoscopy to help detect colon cancer. 5)  Take calcium +Vitamin D daily. 6)  Take an Aspirin every day. Prescriptions: SYNTHROID 75 MCG TABS (LEVOTHYROXINE SODIUM) Take 1 tablet by mouth every morning  #100 x 3   Entered and Authorized by:   Roderick Pee MD   Signed by:   Roderick Pee MD on 04/18/2009   Method used:   Electronically to        [facility] [phone]* (retail)       [address]       [place], [place]  [address]       Ph: [phone]       Fax: [phone]   RxID:   [id number]  FLONASE 50 MCG/ACT SUSP (FLUTICASONE PROPIONATE) as needed  #3 x 3   Entered and Authorized by:   Roderick Pee MD   Signed by:   Roderick Pee MD on 04/18/2009   Method used:   Electronically to        [facility]. [phone]* (retail)       [address]       [place], [place]  [address]       Ph: [phone]       Fax: [phone]   RxID:   [id number] SINGULAIR 10 MG  TABS (MONTELUKAST SODIUM) Take 1 tablet by mouth once a day  #100 x 3   Entered and Authorized by:   Roderick Pee MD   Signed by:   Roderick Pee MD on 04/18/2009   Method used:   Electronically to        [facility] [phone]* (retail)       [address]       [place], [place]  [address]       Ph: [phone]       Fax: [phone]   RxID:   [id number] TRAZODONE HCL 150 MG  TABS (TRAZODONE HCL) 1/2 tab at bedtime as needed  #50 x 3   Entered and Authorized by:   Roderick Pee MD   Signed by:   Roderick Pee MD on 04/18/2009   Method used:   Electronically to        [facility] [phone]* (retail)       [address]       [place], [place]  [address]       Ph: [phone]       Fax: [phone]   RxID:   [id number]

## 2010-09-23 NOTE — Progress Notes (Signed)
Summary: wants to be worked in with Dr Tawanna Cooler today   Phone Note Call from Patient Call back at [phone]   Caller: pt vm triage Call For: Tawanna Cooler Summary of Call: wants to see Dr Tawanna Cooler today.  She has a fullness in her throat and she feels like maybe something is going on with her thyroid.  She had half of it removed at one time.  She does not feel well  Initial call taken by: Roselle Locus,  March 28, 2009 8:31 AM  Follow-up for Phone Call        patient coming in this morning Follow-up by: Kern Reap CMA Duncan Dull),  March 28, 2009 9:16 AM

## 2010-09-23 NOTE — Procedures (Signed)
Summary: Ledon Snare MD  Colon/James Cora Collum MD   Imported By: Lester Ehrenberg 03/10/2010 09:01:13  _____________________________________________________________________  External Attachment:    Type:   Image     Comment:   External Document

## 2010-09-23 NOTE — Assessment & Plan Note (Signed)
Summary: sore throat/jls   Vital Signs:  Patient Profile:   65 Years Old Female Height:     59.75 inches (151.77 cm) Weight:      118.5 pounds Temp:     98.0 degrees F BP sitting:   94 / 56  Vitals Entered By: Sindy Guadeloupe RN (November 21, 2007 9:22 AM)                 Chief Complaint:  sore throat x 4 days--green phlegm--check spot on back of throat.  History of Present Illness: is a 65 year old female, who comes in today for evaluation of congestion, runny nose, cough, and a severe sore throat x 10 days.  Her cold symptoms started about 10 days ago.  She has a history of underlying allergic rhinitis and asthma.  She is not been wheezing.  She's been using her cold treatment program without line before and is doing well.  Five days ago she developed a left-sided sore throat.  She has had a history of fever blisters on her lips.    Current Allergies (reviewed today): ! 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 No apparent alopecia or balding. Eyes:     No corneal or conjunctival inflammation noted. EOMI. Perrla. Funduscopic exam benign, without hemorrhages, exudates or papilledema. Vision grossly normal. Ears:     External ear exam shows no significant lesions or deformities.  Otoscopic examination reveals clear canals, tympanic membranes are intact bilaterally without bulging, retraction, inflammation or discharge. Hearing is grossly normal bilaterally. Nose:     External nasal examination shows no deformity or inflammation. Nasal mucosa are pink and moist without lesions or exudates. Mouth:     there is a 5  mm x 5 mm rounded, punctuated lesion, left anterior tonsillar pillar, consistent with a herpetic lesion Neck:     No deformities, masses, or tenderness noted. Chest Wall:     No deformities, masses, or tenderness noted. Breasts:     No mass, nodules, thickening, tenderness, bulging, retraction, inflamation, nipple discharge or skin changes noted.   Lungs:     Normal respiratory effort, chest expands symmetrically. Lungs are clear to auscultation, no crackles or wheezes.    Impression & Recommendations:  Problem # 1:  VIRAL URI (ICD-465.9) Assessment: New  Her updated medication list for this problem includes:    Baby Aspirin 81 Mg Chew (Aspirin) ..... Once daily    Hycodan 5-1.5 Mg/23ml Syrp (Hydrocodone-homatropine) .Marland Kitchen... 1 or 2 tsps at bedtime as needed   Complete Medication List: 1)  Trazodone Hcl 150 Mg Tabs (Trazodone hcl) .... 1/2 tab at bedtime as needed 2)  Sertraline Hcl 100 Mg Tabs (Sertraline hcl) .... 1/2 once daily 3)  Singulair 10 Mg Tabs (Montelukast sodium) .... Take 1 tablet by mouth once a day 4)  Baby Aspirin 81 Mg Chew (Aspirin) .... Once daily 5)  Multivitamins Tabs (Multiple vitamin) .... Once daily 6)  Calcium 600 1500 Mg Tabs (Calcium carbonate) .... Two times a day 7)  Vitamin D 1000 Unit Caps (Cholecalciferol) .... One every other week per gyn 8)  Levothroid 100 Mcg Tabs (  Levothyroxine sodium) .... Once daily 9)  Zovirax 200 Mg Caps (Acyclovir) .... 3 in am & 2 in pm 10)  Hycodan 5-1.5 Mg/35ml Syrp (Hydrocodone-homatropine) .Marland Kitchen.. 1 or 2 tsps at bedtime as needed   Patient Instructions: 1)  drinks 40 ounces of water a day. He may take hydrocodone cough syrup one or 2 teaspoons at bedtime as needed for the cough.  Also begin Zovirax 3 in the morning and two at bedtime until the fever blister heals.  Return p.r.n.    Prescriptions: HYCODAN 5-1.5 MG/5ML  SYRP (HYDROCODONE-HOMATROPINE) 1 or 2 tsps at bedtime as needed  #8 oz x 1   Entered and Authorized by:    Roderick Pee MD   Signed by:   Roderick Pee MD on 11/21/2007   Method used:   Print then Give to Patient   RxID:   [id number] ZOVIRAX 200 MG  CAPS (ACYCLOVIR) 3 in am & 2 in pm  #100 x 3   Entered and Authorized by:   Roderick Pee MD   Signed by:   Roderick Pee MD on 11/21/2007   Method used:   Print then Give to Patient   RxID:   [id number]  ]

## 2010-09-23 NOTE — Progress Notes (Signed)
Summary: rx levothroid  Phone Note From Pharmacy   Caller: [facility] [address] [place]. [phone]* Summary of Call: levothroid is on long term manufacturer back order until the middle of May.  can they switch to lovthyoxin 0.1 or synthroid 0.1? Initial call taken by: Kern Reap CMA,  November 09, 2008 12:24 PM  Follow-up for Phone Call        Synthroid .1, dispense 100 tablets directions one daily refills x 3 Follow-up by: Roderick Pee MD,  November 09, 2008 1:51 PM    New/Updated Medications: SYNTHROID 100 MCG TABS (LEVOTHYROXINE SODIUM) take one tab once daily   Prescriptions: SYNTHROID 100 MCG TABS (LEVOTHYROXINE SODIUM) take one tab once daily  #100 x 3   Entered by:   Kern Reap CMA   Authorized by:   Roderick Pee MD   Signed by:   Kern Reap CMA on 11/09/2008   Method used:   Electronically to        [facility] [phone]* (retail)       [address]       [place], [place]  [address]       Ph: [phone]       Fax: [phone]   RxID:   [id number]

## 2010-09-23 NOTE — Medication Information (Signed)
Summary: Prior Authorization Request and Approval for Singulair/[facility]  Prior Authorization Request and Approval for Singulair/[employer]   Imported By: Maryln Gottron 02/04/2009 13:03:41  _____________________________________________________________________  External Attachment:    Type:   Image     Comment:   External Document

## 2010-09-23 NOTE — Progress Notes (Signed)
Summary: Manometry and Ph study  Phone Note Outgoing Call   Call placed by: Ashok Cordia RN,  March 10, 2010 3:04 PM Summary of Call: Pt needs Manometry and 24 hr PH study per Dr. Jarold Motto.  Sch for 03/24/10 at 9:00, [facility].  Does pt need to stop PPI's? Initial call taken by: Ashok Cordia RN,  March 10, 2010 3:05 PM  Follow-up for Phone Call        off ppi for 72h Follow-up by: Mardella Layman MD Clementeen Graham,  March 10, 2010 3:14 PM     Appended Document: Manometry and Ph study LM for pt to call.   Appended Document: Manometry and Ph study Pt notified of appt.  Pt will stop by to pick up instructions.

## 2010-09-23 NOTE — Assessment & Plan Note (Signed)
Summary: upper abd. pain--ch.   History of Present Illness Visit Type: consult  Primary GI MD: Sheryn Bison MD FACP FAGA Primary Provider: Kelle Darting, MD  Requesting Provider: Kelle Darting, MD  Chief Complaint: Upper abd pain, loss of appetite, constipation, nausea, and belching  History of Present Illness:   65 year old Caucasian female referred by Dr. Kelle Darting for worsening acid reflux symptoms with regurgitation and burning substernal chest pain despite Nexium 20 mg twice a day.She also relates intermittent dysphasia and nocturnal symptomatology. She has a lot of belching and burping, but denies anorexia or weight loss. She has had multiple previous colonoscopies by Dr. Carman Ching, but has never had endoscopic exam.  She denies anorexia or weight loss. She has chronic depression treated with trazodone and sertraline. She's had previous partial thyroidectomy is on thyroid replacement medication. She denies specific A. Billig complaints at this time. She did not have previous ultrasound abdomen. She denies Raynaud's phenomenon or any history of collagen vascular disease or liver disease. She has never smoked and denies ethanol or NSAID abuse.   GI Review of Systems    Reports abdominal pain, acid reflux, belching, chest pain, heartburn, and  nausea.     Location of  Abdominal pain: upper abdomen.    Denies bloating, dysphagia with liquids, dysphagia with solids, loss of appetite, vomiting, vomiting blood, weight loss, and  weight gain.      Reports change in bowel habits and  constipation.     Denies anal fissure, black tarry stools, diarrhea, diverticulosis, fecal incontinence, heme positive stool, hemorrhoids, irritable bowel syndrome, jaundice, light color stool, liver problems, rectal bleeding, and  rectal pain.    Current Medications (verified): 1)  Trazodone Hcl 150 Mg  Tabs (Trazodone Hcl) .... 1/2 Tab At Bedtime As Needed 2)  Sertraline Hcl 100 Mg  Tabs (Sertraline Hcl)  .Marland Kitchen.. 1 Tab @ Bedtime 3)  Multivitamins   Tabs (Multiple Vitamin) .... Once Daily 4)  Calcium 600 1500 Mg  Tabs (Calcium Carbonate) .... Two Times A Day 5)  Synthroid 75 Mcg Tabs (Levothyroxine Sodium) .... Take 1 Tablet By Mouth Every Morning 6)  Acyclovir 200 Mg Caps (Acyclovir) .... Take 3 Caps in The Am and 2 Caps in The Pm As Needed 7)  Estratest 1.25 .... 1/3 Tablet By Mouth Once Daily  Allergies (verified): 1)  ! * Sulfa,teracycline,codeine 2)  ! * Clarithromycin 3)  ! Tetracycline 4)  ! Sulfa  Past History:  Past medical, surgical, family and social histories (including risk factors) reviewed for relevance to current acute and chronic problems.  Past Medical History: Reviewed history from 10/18/2009 and no changes required. follicular adenoma R lobe thyroid, Herpes simplex labialis hypothyroidism osteo- arthritis depression allergic rhinitis  ? asthma sleep dysfunction hysterectomy 15 years ago for nonmalignant reasons.  Ovaries were left intact IGA deficiency   Past Surgical History: Reviewed history from 10/21/2006 and no changes required. 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 Breast CA - mother,  IgA deficiency-father, MI age 99 mother, MA age 2, osteoporosis - mother, RA - mother  pseudogout Family History of Colon Polyps:Father  Family History of Colitis: Mother  No FH of Colon Cancer:  Social History: Reviewed history from 09/05/2007 and no changes required. Occupation: Retired Runner, broadcasting/film/video  Divorced Never  Smoked Alcohol use-yes: 2 cups daily  Drug use-no Regular exercise-yes Daily Caffeine Use: 4 cups of coffee daily   Review of Systems       The patient complains of allergy/sinus.  The patient denies  anemia, anxiety-new, arthritis/joint pain, back pain, blood in urine, breast changes/lumps, change in vision, confusion, cough, coughing up blood, depression-new, fainting, fatigue, fever, headaches-new, hearing problems, heart murmur, heart rhythm changes, itching, menstrual pain, muscle pains/cramps, night sweats, nosebleeds, pregnancy symptoms, shortness of breath, skin rash, sleeping problems, sore throat, swelling of feet/legs, swollen lymph glands, thirst - excessive , urination - excessive , urination changes/pain, urine leakage, vision changes, and voice change.    Vital Signs:  Patient profile:   65 year old female Menstrual status:  hysterectomy Height:      59.75 inches Weight:      113 pounds BMI:     22.33 BSA:     1.46 Pulse rate:   88 / minute Pulse rhythm:   regular BP sitting:   122 / 76  (left arm) Cuff size:   regular  Vitals Entered By: Ok Anis CMA (March 04, 2010 9:06 AM)  Physical Exam  General:  Well developed, well nourished, no acute distress.healthy appearing.   Head:  Normocephalic and atraumatic. Eyes:  PERRLA, no icterus. Neck:  Supple; no masses or thyromegaly. Lungs:  Clear throughout to auscultation. Heart:  Regular rate and rhythm; no murmurs, rubs,  or bruits. Abdomen:  Soft, nontender and nondistended. No masses, hepatosplenomegaly or hernias noted. Normal bowel sounds. Msk:  Symmetrical with no gross deformities. Normal posture. Pulses:  Normal pulses noted. Extremities:  No clubbing, cyanosis, edema or deformities noted. Neurologic:  Alert and  oriented x4;  grossly normal neurologically. Cervical Nodes:  No significant cervical adenopathy. Psych:  Alert and cooperative. Normal mood and affect.   Impression & Recommendations:  Problem # 1:  ESOPHAGEAL REFLUX (ICD-530.81) Assessment Improved Continue b.i.d. Nexium with p.r.n. liquid Carafate and p.r.n. Levsin for abdominal pain. Ultrasound and endoscopic exam and screening labs have been  ordered. Reflex regime has been reviewed with the patient. Orders: TLB-CBC Platelet - w/Differential (85025-CBCD) TLB-BMP (Basic Metabolic Panel-BMET) (80048-METABOL) TLB-Hepatic/Liver Function Pnl (80076-HEPATIC) TLB-TSH (Thyroid Stimulating Hormone) (84443-TSH) TLB-B12, Serum-Total ONLY (98119-J47) TLB-Ferritin (82728-FER) TLB-Folic Acid (Folate) (82746-FOL) TLB-IBC Pnl (Iron/FE;Transferrin) (83550-IBC) TLB-IgA (Immunoglobulin A) (82784-IGA) T-Sprue Panel (Celiac Disease Aby Eval) (83516x3/[address])  Problem # 2:  CHEST PAIN UNSPECIFIED (ICD-786.50) Assessment: Comment Only  Orders: TLB-CBC Platelet - w/Differential (85025-CBCD) TLB-BMP (Basic Metabolic Panel-BMET) (80048-METABOL) TLB-Hepatic/Liver Function Pnl (80076-HEPATIC) TLB-TSH (Thyroid Stimulating Hormone) (84443-TSH) TLB-B12, Serum-Total ONLY (82956-O13) TLB-Ferritin (82728-FER) TLB-Folic Acid (Folate) (82746-FOL) TLB-IBC Pnl (Iron/FE;Transferrin) (83550-IBC) TLB-IgA (Immunoglobulin A) (82784-IGA) T-Sprue Panel (Celiac Disease Aby Eval) (83516x3/[address])  Problem # 3:  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 She does take acyclovir 4 p.r.n. herpetic lesions.  Problem # 4:  GOITER, UNSPECIFIED (ICD-240.9) Assessment: Unchanged history of previous thyroid surgery and she is on Synthroid and calcium replacement.  Patient Instructions: 1)  Please go to the basement for lab work. 2)  Prescriptions will be sent to your pharmacy for Nexium two times a day before breakfast and before supper,  Carafate suspension four times a day after meals and at bedtime and Levsin to use as needed. 3)  You are scheduled for an ultrasound and upper endoscopy. 4)  The medication list was reviewed and reconciled.  All changed / newly prescribed medications were explained.  A complete  medication list was provided to the patient /  caregiver. 5)  Copy sent to : Dr. Kelle Darting 6)  Please continue current medications.  7)  Avoid foods high in acid content ( tomatoes, citrus juices, spicy foods) . Avoid eating within 3 to 4 hours of lying down or before exercising. Do not over eat; try smaller more frequent meals. Elevate head of bed four inches when sleeping.  8)  Upper Endoscopy with Dilatation brochure given.   Appended Document: upper abd. pain--ch.    Clinical Lists Changes  Medications: Added new medication of NEXIUM 40 MG  CPDR (ESOMEPRAZOLE MAGNESIUM) 1 capsule each day 30 minutes before meal - Signed Added new medication of CARAFATE 1 GM/10ML  SUSP (SUCRALFATE) 1 Tbsp pc and hs - Signed Added new medication of LEVSIN/SL 0.125 MG  SUBL (HYOSCYAMINE SULFATE) 1 SL 1 4-6 hrs as needed - Signed Rx of NEXIUM 40 MG  CPDR (ESOMEPRAZOLE MAGNESIUM) 1 capsule each day 30 minutes before meal;  #30 x 6;  Signed;  Entered by: Ashok Cordia RN;  Authorized by: Mardella Layman MD [facility];  Method used: Electronically to [facility] [phone]*, [address], [place], [place], [place]  [address], Ph: [phone], Fax: [phone] Rx of CARAFATE 1 GM/10ML  SUSP (SUCRALFATE) 1 Tbsp pc and hs;  #14 oz x 2;  Signed;  Entered by: Ashok Cordia RN;  Authorized by: Mardella Layman MD [facility];  Method used: Electronically to [facility]. [phone]*, [address], [place], [place], [place]  [address], Ph: [phone], Fax: [phone] Rx of LEVSIN/SL 0.125 MG  SUBL (HYOSCYAMINE SULFATE) 1 SL 1 4-6 hrs as needed;  #50 x 3;  Signed;  Entered by: Ashok Cordia RN;  Authorized by: Mardella Layman MD [facility];  Method used: Electronically to [facility]. [phone]*, [address] [place], [place], [place]  [address], Ph: [phone], Fax: [phone] Orders: Added new Test order of EGD (EGD) - Signed Added new Test order of Ultrasound Abdomen (UAS) - Signed    Prescriptions: LEVSIN/SL 0.125 MG   SUBL (HYOSCYAMINE SULFATE) 1 SL 1 4-6 hrs as needed  #50 x 3   Entered by:   Ashok Cordia RN   Authorized by:   Mardella Layman MD [facility]   Signed by:   Ashok Cordia RN on 03/04/2010   Method used:   Electronically to        [facility] [phone]* (retail)       [address]       [place], [place]  [address]       Ph: [phone]       Fax: [phone]   RxID:   [id number] CARAFATE 1 GM/10ML  SUSP (SUCRALFATE) 1 Tbsp pc and hs  #14 oz x 2   Entered by:   Ashok Cordia RN   Authorized by:   Mardella Layman MD [facility]   Signed by:   Ashok Cordia RN on 03/04/2010   Method used:   Electronically to        [facility] [phone]* (retail)       [address]       [place], [place]  [address]       Ph: [phone]       Fax: [phone]   RxID:   [id number] NEXIUM 40 MG  CPDR (  ESOMEPRAZOLE MAGNESIUM) 1 capsule each day 30 minutes before meal  #30 x 6   Entered by:   Ashok Cordia RN   Authorized by:   Mardella Layman MD [facility]   Signed by:   Ashok Cordia RN on 03/04/2010   Method used:   Electronically to        [facility] [phone]* (retail)       [address]       [place], [place]  [address]       Ph: [phone]       Fax: [phone]   RxID:   [id number]    Appended Document: Orders Update    Clinical Lists Changes  Orders: Added new Test order of T- * Misc. Laboratory test [phone]) - Signed

## 2010-09-23 NOTE — Procedures (Signed)
Summary: Ledon Snare MD  Colon/James Cora Collum MD   Imported By: Lester Nora 03/10/2010 08:58:44  _____________________________________________________________________  External Attachment:    Type:   Image     Comment:   External Document

## 2010-09-23 NOTE — Assessment & Plan Note (Signed)
Summary: cpx//ccm   Vital Signs:  Patient profile:   65 year old female Menstrual status:  hysterectomy Height:      59.75 inches Weight:      113 pounds Temp:     98.2 degrees F oral BP sitting:   110 / 70  (left arm) Cuff size:   regular  Vitals Entered By: Kern Reap CMA Duncan Dull) (April 21, 2010 9:38 AM) Flu Vaccine Consent Questions     Do you have a history of severe allergic reactions to this vaccine? no    Any prior history of allergic reactions to egg and/or gelatin? no    Do you have a sensitivity to the preservative Thimersol? no    Do you have a past history of Guillan-Barre Syndrome? no    Do you currently have an acute febrile illness? no    Have you ever had a severe reaction to latex? no    Vaccine information given and explained to patient? yes    Are you currently pregnant? no    Lot Number:[id number]   Exp Date:02/21/2011   Site Given  Left Deltoid IM    Primary Care Provider:  Kelle Darting, MD    History of Present Illness: Jo Gomez is a 65 year old female, who comes in today for evaluation of multiple issues.  She has history of underlying depression.  She is currently taking Zoloft 100 mg in the morning and trazodone 75 mg at bedtime.  However, she says her energy level has been down since we changed her medication a year ago.  She also has sleep dysfunction, where she wakes up at 5 o'clock in the morning and can't go back to sleep.  We discussed various options.  We will take her Zoloft and trazodone together at bedtime, and if in 3 weeks.  We don't see any improvement, then will discuss further medication changes.  Also, advised her to call and get a consult appointment with Dr. Rolly Pancake, [place].  She takes Synthroid 75 micrograms daily for hypothyroidism.  Will check TSH level.  She uses acyclovir p.r.n. for an outbreak of herpes.  Her GYN gives her Estratest, which he takes the third of a tablet daily.  She states she can't come off of this she gets too  bad hot flushes.  Advise and negative effect of long-term hormone therapy.  She should take a baby aspirin daily.  However, because of her reflux esophagitis.  She can't tolerate aspirin.  Four reflux.  She is now double her Nexium 40 mg b.i.d. this was done by her GI.  She also takes vitamin B12 1 cc monthly for B12 deficiency.  She also has an IgA deficiency.  Will give her a flu shot today.  Tetanus 2004, Pneumovax 2008 exam yearly, annual dental exam, BSE monthly, annual mammography, colonoscopy, normal.  She has had her uterus and ovaries removed.  Allergies: 1)  ! * Sulfa,teracycline,codeine 2)  ! * Clarithromycin 3)  ! Tetracycline 4)  ! Sulfa  Past History:  Past medical, surgical, family and social histories (including risk factors) reviewed, and no changes noted (except as noted below).  Past Medical History: Reviewed history from 10/18/2009 and no changes required. follicular adenoma R lobe thyroid, Herpes simplex labialis hypothyroidism osteo- arthritis depression allergic rhinitis  ? asthma sleep dysfunction hysterectomy 15 years ago for nonmalignant reasons.  Ovaries were left intact IGA deficiency   Past Surgical History: Reviewed history from 10/21/2006 and no changes required. 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 Breast CA - mother,  IgA deficiency-father, MI age 2 mother, MA age 74, osteoporosis - mother, RA - mother  pseudogout Family History of Colon Polyps:Father  Family History of Colitis: Mother  No FH of Colon Cancer:  Social History: Reviewed history from 03/04/2010 and no changes required. 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 No apparent alopecia or balding. Eyes:  No corneal or conjunctival inflammation noted. EOMI. Perrla. Funduscopic exam benign, without hemorrhages, exudates or papilledema. Vision grossly normal. Ears:  External ear exam shows no significant lesions or deformities.  Otoscopic examination reveals clear canals, tympanic membranes are intact bilaterally without bulging, retraction, inflammation or discharge. Hearing is grossly normal bilaterally. Nose:  External nasal examination shows no deformity or inflammation. Nasal mucosa are pink and moist without lesions or exudates. Mouth:  Oral mucosa and oropharynx without lesions or exudates.  Teeth in good repair. Neck:  No deformities, masses, or tenderness noted. Chest Wall:  No deformities, masses, or tenderness noted. Breasts:  No mass, nodules, thickening, tenderness, bulging, retraction, inflamation, nipple discharge or skin changes noted.   Lungs:  Normal respiratory effort, chest expands symmetrically. Lungs are clear to auscultation, no crackles or wheezes. Heart:  Normal rate and regular rhythm. S1 and S2 normal without gallop, murmur, click, rub or other extra sounds. Abdomen:  Bowel sounds positive,abdomen soft and non-tender without masses, organomegaly or hernias noted. Msk:  No deformity or scoliosis noted of thoracic or lumbar spine.   Pulses:  R and L carotid,radial,femoral,dorsalis pedis and posterior tibial pulses are full and equal bilaterally Extremities:  No clubbing, cyanosis, edema, or deformity noted with normal full range of motion of all joints.   Neurologic:  No cranial nerve deficits noted. Station and gait are normal. Plantar reflexes  are down-going bilaterally. DTRs are symmetrical throughout. Sensory, motor and coordinative functions appear intact. Skin:  Intact without suspicious lesions or rashes Cervical Nodes:  No lymphadenopathy noted Axillary Nodes:  No palpable lymphadenopathy Inguinal Nodes:  No significant adenopathy Psych:  Cognition and judgment appear intact. Alert and cooperative with normal attention span and concentration. No apparent delusions, illusions, hallucinations   Impression & Recommendations:  Problem # 1:  VITAMIN B12 DEFICIENCY (ICD-266.2) Assessment Improved  Orders: Prescription Created Electronically [phone])  Problem # 2:  HEALTH MAINTENANCE EXAM (ICD-V70.0) Assessment: Unchanged  Orders: Prescription Created Electronically [phone]) EKG w/ Interpretation (93000)  Problem # 3:  VIRAL URI (ICD-465.9) Assessment: Unchanged  Problem # 4:  DEPRESSIVE DISORDER NOT ELSEWHERE CLASSIFIED (ICD-311) Assessment: Deteriorated  Her updated medication list for this problem includes:    Trazodone Hcl 150 Mg Tabs (Trazodone hcl) .Marland Kitchen... 1/2 tab at bedtime as needed    Sertraline Hcl 100 Mg Tabs (Sertraline hcl) .Marland Kitchen... 1 tab @ bedtime  Orders: Prescription Created Electronically [phone])  Complete Medication List: 1)  Trazodone Hcl 150 Mg Tabs (Trazodone hcl) .... 1/2 tab at bedtime as needed 2)  Sertraline Hcl 100 Mg Tabs (Sertraline hcl) .Marland Kitchen.. 1 tab @ bedtime 3)  Multivitamins Tabs (Multiple vitamin) .... Once daily 4)  Calcium 600 1500 Mg Tabs (Calcium carbonate) .... Two times a day 5)  Synthroid 75 Mcg Tabs (Levothyroxine sodium) .... Take 1 tablet by mouth every morning 6)  Acyclovir 200 Mg Caps (Acyclovir) .... Take 3 caps in the am and 2 caps in the pm as needed 7)  Estratest 1.25  .... 1/3 tablet by mouth once daily 8)  Nexium 40 Mg Cpdr (Esomeprazole magnesium) .Marland Kitchen.. 1 caps two times a day. 9)  Cobal-1000 1000 Mcg/ml Soln (Cyanocobalamin) .Marland Kitchen.. 1 cc monthly 10)  Bd Insulin Syringe 25g  X 5/8" 1 Ml Misc (Insulin syringe-needle u-100) .... Uad for b12  Other Orders: Admin 1st Vaccine (16[address]) Flu Vaccine 53yrs + [phone])  Patient Instructions: 1)  take both the trazodone and sertraline at bedtime.  If you don't seem prudent in 3 weeks.  Call me and we discussed options. 2)  Call today and make an appointment to see Dr. Rolly Pancake, [place]. 3)  30 minutes of walking daily.  Also will help you feel better 4)  Please schedule a follow-up appointment in 1 year. 5)  Please schedule a follow-up appointment as needed. Prescriptions: BD INSULIN SYRINGE 25G X 5/8" 1 ML MISC (INSULIN SYRINGE-NEEDLE U-100) UAD for b12  #12 x 1   Entered and Authorized by:   Roderick Pee MD   Signed by:   Roderick Pee MD on 04/21/2010   Method used:   Electronically to        [facility] [phone]* (retail)       [address]       [place], [place]  [address]       Ph: [phone]       Fax: [phone]   RxID:   [id number] COBAL-1000 1000 MCG/ML SOLN (CYANOCOBALAMIN) 1 cc monthly  #30 cc x 1   Entered and Authorized by:   Roderick Pee MD   Signed by:   Roderick Pee MD on 04/21/2010   Method used:   Electronically to        [facility] [phone]* (retail)       [address]       [place], [place]  [address]       Ph: [phone]       Fax: [phone]   RxID:   [id number] ACYCLOVIR 200 MG CAPS (ACYCLOVIR) take 3 caps in the am and 2 caps in the pm as needed  #100 x 3   Entered and Authorized by:   Roderick Pee MD   Signed by:   Roderick Pee MD on 04/21/2010   Method used:   Electronically to        [facility] [phone]* (retail)       [address]       [place], [place]  [address]       Ph: [phone]       Fax: [phone]   RxID:   [id number] SYNTHROID 75 MCG TABS (LEVOTHYROXINE SODIUM) Take 1 tablet by mouth every morning  #100 x 3   Entered and Authorized  by:   Roderick Pee MD   Signed by:   Roderick Pee MD on 04/21/2010   Method used:   Electronically to  [facility] [address] [phone]* (retail)       [address]       [place], [place]  [address]       Ph: [phone]       Fax: [phone]   RxID:   [id number] SERTRALINE HCL 100 MG  TABS (SERTRALINE HCL) 1 tab @ bedtime  #100 x 3   Entered and Authorized by:   Roderick Pee MD   Signed by:   Roderick Pee MD on 04/21/2010   Method used:   Electronically to        [facility] [phone]* (retail)       [address]       [place], [place]  [address]       Ph: [phone]       Fax: [phone]   RxID:   [id number] TRAZODONE HCL 150 MG  TABS (TRAZODONE HCL) 1/2 tab at bedtime as needed  #50.0 Each x 3   Entered and Authorized by:   Roderick Pee MD   Signed by:   Roderick Pee MD on 04/21/2010   Method used:   Electronically to        [facility] [phone]* (retail)       [address]       [place], [place]  [address]       Ph: [phone]       Fax: [phone]   RxID:   [id number]

## 2010-09-23 NOTE — Progress Notes (Signed)
Summary: levothroid refill  Phone Note From Pharmacy   Caller: [facility] [address] [address]. [phone]* Details for Reason: refill Summary of Call: the rx for levothroid .1mg  is currently unavailable from the manufacturer.  please advise if there is something eles to switch for this patient . Initial call taken by: Kern Reap CMA,  October 26, 2008 1:43 PM    levothyroxine 0.1 mg #100 one daily

## 2010-09-23 NOTE — Letter (Signed)
Summary: Patient [facility] Biopsy Results   Gastroenterology  [address] [place], [place] [address]   Phone: [phone]  Fax: [phone]        March 12, 2010 MRN: [id number]    [name] [address] [place], [place]  [address]    Dear [name],  I am pleased to inform you that the biopsies taken during your recent endoscopic examination did not show any evidence of cancer upon pathologic examination.  Additional information/recommendations:  __No further action is needed at this time.  Please follow-up with      your primary care physician for your other healthcare needs.  __ Please call [phone] to schedule a return visit to review      your condition.  XX__ Continue with the treatment plan as outlined on the day of your      exam.NO HELICOBACTER INFECTION NOTED,AND THE SMALL BOWEL BIOPSY WAS NORMAL.  __ You should have a repeat endoscopic examination for this problem              in _ months/years.   Please call us if you are having persistent problems or have questions about your condition that have not been fully answered at this time.  Sincerely,  Mardella Layman MD [facility]  This letter has been electronically signed by your physician.  Appended Document: Patient Notice-Endo Biopsy Results letter mailed.

## 2010-09-23 NOTE — Procedures (Signed)
Summary: Ledon Snare MD  Colon/James Cora Collum MD   Imported By: [employer] 03/10/2010 08:59:59  _____________________________________________________________________  External Attachment:    Type:   Image     Comment:   External Document

## 2010-09-23 NOTE — Assessment & Plan Note (Signed)
Summary: uti/dm   Vital Signs:  Patient profile:   65 year old female Menstrual status:  hysterectomy Temp:     99.1 degrees F oral BP sitting:   130 / 60  (left arm) Cuff size:   regular  Vitals Entered By: Sid Falcon LPN (June 20, 2009 9:55 AM) CC: Foul smell to urine, urgency, burning, feverish X 2 days   History of Present Illness: Acute visit. Onset 2 days ago urine frequency and burning with urination. Some diffuse body aches. Felt feverish but has not taken temperature. Noted foul odor with urine. Similar symptoms of UTI in the past. Denies nausea, vomiting, or back pain. Allergic to sulfa and tetracycline. She is drinking plenty of fluids.  Allergies: 1)  ! * Sulfa,teracycline,codeine 2)  ! * Clarithromycin 3)  ! Tetracycline 4)  ! Sulfa  Past History:  Past Medical History: Last updated: 08/13/2008 follicular adenoma R lobe thyroid, Herpes simplex labialis hypothyroidism osteo- arthritis depressionallergic rhinitis sleep dysfunction hysterectomy 15 years ago for nonmalignant reasons.  Ovaries were left intact IGg def  Review of Systems      See HPI  Physical Exam  General:  Well-developed,well-nourished,in no acute distress; alert,appropriate and cooperative throughout examination Mouth:  Oral mucosa and oropharynx without lesions or exudates.  Teeth in good repair. Lungs:  Normal respiratory effort, chest expands symmetrically. Lungs are clear to auscultation, no crackles or wheezes. Heart:  regular rhythm and rate Msk:  no CVA tenderness   Impression & Recommendations:  Problem # 1:  ACUTE CYSTITIS (ICD-595.0)  Her updated medication list for this problem includes:    Ciprofloxacin Hcl 250 Mg Tabs (Ciprofloxacin hcl) ..... One by mouth two times a day for 5 days  Orders: UA Dipstick w/o Micro (manual) (62130) T-Culture, Urine ([id number])  Complete Medication List: 1)  Trazodone Hcl 150 Mg Tabs (Trazodone hcl) .... 1/2 tab at bedtime as  needed 2)  Sertraline Hcl 100 Mg Tabs (Sertraline hcl) .Marland Kitchen.. 1 tab @ bedtime 3)  Singulair 10 Mg Tabs (Montelukast sodium) .... Take 1 tablet by mouth once a day 4)  Baby Aspirin 81 Mg Chew (Aspirin) .... Once daily 5)  Multivitamins Tabs (Multiple vitamin) .... Once daily 6)  Calcium 600 1500 Mg Tabs (Calcium carbonate) .... Two times a day 7)  Vitamin D 95284 Unit Caps (Ergocalciferol) .... Take one tab every 2 weeks 8)  Flonase 50 Mcg/act Susp (Fluticasone propionate) .... As needed 9)  Synthroid 75 Mcg Tabs (Levothyroxine sodium) .... Take 1 tablet by mouth every morning 10)  Ciprofloxacin Hcl 250 Mg Tabs (Ciprofloxacin hcl) .... One by mouth two times a day for 5 days  Patient Instructions: 1)  Drink plenty of fluids up to 3-4 quarts a day. Cranberry juice is especially recommended in addition to large amounts of water. Avoid caffeine & carbonated drinks, they tend to irritate the bladder, Return in 3-5 days if you're not better: sooner if you're feeling worse.  Prescriptions: CIPROFLOXACIN HCL 250 MG TABS (CIPROFLOXACIN HCL) one by mouth two times a day for 5 days  #10 x 0   Entered and Authorized by:   Evelena Peat MD   Signed by:   Evelena Peat MD on 06/20/2009   Method used:   Electronically to        [facility] [phone]* (retail)       [address]       [place], [place]  [address]  Ph: [phone]       Fax: [phone]   RxID:   [id number]   Laboratory Results   Urine Tests    Routine Urinalysis   Color: yellow Appearance: Clear Glucose: negative   (Normal Range: Negative) Bilirubin: negative   (Normal Range: Negative) Ketone: negative   (Normal Range: Negative) Spec. Gravity: <1.005   (Normal Range: 1.003-1.035) Blood: moderate   (Normal Range: Negative) pH: 5.0   (Normal Range: 5.0-8.0) Protein: negative   (Normal Range: Negative) Urobilinogen: 0.2   (Normal Range: 0-1) Nitrite: negative   (Normal Range:  Negative) Leukocyte Esterace: large   (Normal Range: Negative)

## 2010-09-23 NOTE — Assessment & Plan Note (Signed)
Summary: sinus inf/chest congestion/cjr   Vital Signs:  Patient profile:   65 year old female Menstrual status:  hysterectomy Weight:      116 pounds Temp:     98.5 degrees F oral Pulse rate:   74 / minute Pulse rhythm:   regular BP sitting:   114 / 66  Vitals Entered By: Lynann Beaver CMA (June 05, 2010 10:20 AM) CC: sinus congestin with cough Is Patient Diabetic? No Pain Assessment Patient in pain? no        Primary Care Provider:  Kelle Darting, MD   CC:  sinus congestin with cough.  History of Present Illness: Jo Gomez is a 65 year old female, who comes in today for a 4 week history of coughing.  She has perennial allergic rhinitis, but is always worse in the fall.  She knows that she has a mold allergy.  Review of systems otherwise negative  Current Medications (verified): 1)  Trazodone Hcl 150 Mg  Tabs (Trazodone Hcl) .... 1/2 Tab At Bedtime As Needed 2)  Sertraline Hcl 100 Mg  Tabs (Sertraline Hcl) .Marland Kitchen.. 1 Tab @ Bedtime 3)  Multivitamins   Tabs (Multiple Vitamin) .... Once Daily 4)  Calcium 600 1500 Mg  Tabs (Calcium Carbonate) .... Two Times A Day 5)  Synthroid 75 Mcg Tabs (Levothyroxine Sodium) .... Take 1 Tablet By Mouth Every Morning 6)  Acyclovir 200 Mg Caps (Acyclovir) .... Take 3 Caps in The Am and 2 Caps in The Pm As Needed 7)  Estratest 1.25 .... 1/3 Tablet By Mouth Once Daily 8)  Nexium 40 Mg  Cpdr (Esomeprazole Magnesium) .Marland Kitchen.. 1 Caps Two Times A Day. 9)  Cobal-1000 1000 Mcg/ml Soln (Cyanocobalamin) .Marland Kitchen.. 1 Cc Monthly 10)  Bd Insulin Syringe 25g X 5/8" 1 Ml Misc (Insulin Syringe-Needle U-100) .... Uad For B12 11)  Mucinex 600 Mg Xr12h-Tab (Guaifenesin) .... As Directed 12)  Sudophed 30 Mg Tabs (Pseudoephedrine Hcl) .... As Directed  Allergies (verified): 1)  ! * Sulfa,teracycline,codeine 2)  ! * Clarithromycin 3)  ! Tetracycline 4)  ! Sulfa  Past History:  Past medical, surgical, family and social histories (including risk factors) reviewed for  relevance to current acute and chronic problems.  Past Medical History: Reviewed history from 10/18/2009 and no changes required. follicular adenoma R lobe thyroid, Herpes simplex labialis hypothyroidism osteo- arthritis depression allergic rhinitis  ? asthma sleep dysfunction hysterectomy 15 years ago for nonmalignant reasons.  Ovaries were left intact IGA deficiency   Past Surgical History: Reviewed history from 10/21/2006 and no changes required. 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 Breast CA - mother,  IgA deficiency-father, MI age 25 mother, MA age 108, osteoporosis - mother, RA - mother  pseudogout Family History of Colon Polyps:Father  Family History of Colitis: Mother  No FH of Colon Cancer:  Social History: Reviewed history from 03/04/2010 and no changes required. Occupation: Retired Runner, broadcasting/film/video  Divorced Never Smoked Alcohol use-yes: 2 cups daily  Drug use-no Regular exercise-yes Daily Caffeine Use: 4 cups of coffee daily   Review of Systems      See HPI  Physical Exam  General:  Well-developed,well-nourished,in no acute distress; alert,appropriate and cooperative throughout examination Head:  Normocephalic and atraumatic without obvious abnormalities. No apparent alopecia or balding. Eyes:  No corneal or conjunctival inflammation noted.  EOMI. Perrla. Funduscopic exam benign, without hemorrhages, exudates or papilledema. Vision grossly normal. Ears:  External ear exam shows no significant lesions or deformities.  Otoscopic examination reveals clear canals, tympanic membranes are intact bilaterally without bulging, retraction, inflammation or discharge. Hearing is grossly normal bilaterally. Nose:  External nasal  examination shows no deformity or inflammation. Nasal mucosa are pink and moist without lesions or exudates. Mouth:  Oral mucosa and oropharynx without lesions or exudates.  Teeth in good repair. Neck:  No deformities, masses, or tenderness noted. Chest Wall:  No deformities, masses, or tenderness noted. Lungs:  Normal respiratory effort, chest expands symmetrically. Lungs are clear to auscultation, no crackles or wheezes.   Impression & Recommendations:  Problem # 1:  ALLERGIC RHINITIS (ICD-477.9) Assessment Deteriorated  Her updated medication list for this problem includes:    Sudophed 30 Mg Tabs (Pseudoephedrine hcl) .Marland Kitchen... As directed  Complete Medication List: 1)  Trazodone Hcl 150 Mg Tabs (Trazodone hcl) .... 1/2 tab at bedtime as needed 2)  Sertraline Hcl 100 Mg Tabs (Sertraline hcl) .Marland Kitchen.. 1 tab @ bedtime 3)  Multivitamins Tabs (Multiple vitamin) .... Once daily 4)  Calcium 600 1500 Mg Tabs (Calcium carbonate) .... Two times a day 5)  Synthroid 75 Mcg Tabs (Levothyroxine sodium) .... Take 1 tablet by mouth every morning 6)  Acyclovir 200 Mg Caps (Acyclovir) .... Take 3 caps in the am and 2 caps in the pm as needed 7)  Estratest 1.25  .... 1/3 tablet by mouth once daily 8)  Nexium 40 Mg Cpdr (Esomeprazole magnesium) .Marland Kitchen.. 1 caps two times a day. 9)  Cobal-1000 1000 Mcg/ml Soln (Cyanocobalamin) .Marland Kitchen.. 1 cc monthly 10)  Bd Insulin Syringe 25g X 5/8" 1 Ml Misc (Insulin syringe-needle u-100) .... Uad for b12 11)  Mucinex 600 Mg Xr12h-tab (Guaifenesin) .... As directed 12)  Sudophed 30 Mg Tabs (Pseudoephedrine hcl) .... As directed 13)  Prednisone 20 Mg Tabs (Prednisone) .... Uad  Patient Instructions: 1)  begin prednisone 20 mg dose two tabs x 3 days, one x 3 days, a half x 3 days, then a half a tablet Monday, Wednesday, Friday, for a two week taper Prescriptions: PREDNISONE 20 MG TABS (PREDNISONE) UAD  #30 x 1   Entered and Authorized by:   Roderick Pee MD   Signed by:   Roderick Pee MD on 06/05/2010   Method used:   Electronically to        [facility]. [phone]* (retail)       [address]       [place], [place]  [address]       Ph: [phone]       Fax: [phone]   RxID:   [id number]

## 2010-09-23 NOTE — Assessment & Plan Note (Signed)
Summary: CPX//VN   Vital Signs:  Patient Profile:   65 Years Old Female Height:     59.75 inches (151.77 cm) Weight:      115 pounds (52.27 kg) Temp:     98.1 degrees F (36.72 degrees C) oral BP sitting:   90 / 58  (right arm)  Pt. in pain?   yes    Location:   sinus H/A    Intensity:   5    Type:       sharp  Vitals Entered By: Arcola Jansky, RN ([birth year] 10:34 AM)                Chief Complaint:  cpx labs done; o pap -gyn.  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  She has a history of IgA deficiency.  She is currently asymptomatic.  She was also like to talk about allergic rhinitis have arm moles checked.  Discuss hormone replacement therapy.  She's also had a lesion on her tongue which is Tax inspector every 6 months.  His been biopsied and was a precancerous lesion.  She's also on Estratest V. the GYN.  She says she takes a third of a tablet a day.  She's been on O. this for 10 years.  We discussed the negative impact of HRT.  On your heart and vascular system.  An anchor to taper off completely.  Her current medications are Singulair 10 mg daily an 81-mg baby aspirin Estratest .625 one third of a tablet a day, levothyroxine .75 mg daily , Zoloft to hundred milligrams half a tablet a day, trazodone 150 mg one half tablet nightly.  As, noted.  She's had a TAH and BSO for nonmalignant reasons.  In 1998.  She had a recent pelvic examination was normal.  She's also had a right partial thyroidectomy and fracture of her right wrist.  She should have the plate taken out soon.  Prior to that should the TNA, and one child.  She is up on all her health maintenance.  Activities.  He does need a Pneumovax.  Acute Visit History:      She denies abdominal pain, chest pain, constipation, cough, diarrhea, earache, eye symptoms, fever, genitourinary symptoms, headache, musculoskeletal  symptoms, nasal discharge, nausea, rash, sinus problems, sore throat, and vomiting.         Current Allergies: ! * SULFA,TERACYCLINE,CODEINE    Risk Factors:  Tobacco use:  never Passive smoke exposure:  no Drug use:  no HIV high-risk behavior:  no Alcohol use:  no Exercise:  yes    Times per week:  5    Type:  walk Seatbelt use:  100 % Sun Exposure:  occasionally  Family History Risk Factors:    Family History of MI in females < 70 years old:  no   Review of Systems      See HPI   Physical Exam  General:     Well-developed,well-nourished,in no acute distress; alert,appropriate and cooperative throughout examination Head:     Normocephalic and atraumatic without obvious abnormalities. No apparent alopecia or balding. Eyes:     No corneal or conjunctival inflammation noted. EOMI. Perrla. Funduscopic exam benign, without hemorrhages, exudates or papilledema. Vision grossly normal. Ears:     External ear exam shows no significant lesions or deformities.  Otoscopic examination reveals clear canals, tympanic membranes are intact bilaterally without bulging, retraction, inflammation or  discharge. Hearing is grossly normal bilaterally. Nose:     External nasal examination shows no deformity or inflammation. Nasal mucosa are pink and moist without lesions or exudates. Mouth:     Oral mucosa and oropharynx without lesions or exudates.  Teeth in good repair. Neck:     No deformities, masses, or tenderness noted.  scar on her neck from previous thyroid surgery Chest Wall:     No deformities, masses, or tenderness noted. Breasts:     No mass, nodules, thickening, tenderness, bulging, retraction, inflamation, nipple discharge or skin changes noted.   Lungs:     Normal respiratory effort, chest expands symmetrically. Lungs are clear to auscultation, no crackles or wheezes. Heart:     Normal rate and regular rhythm. S1 and S2 normal without gallop, murmur, click, rub or other  extra sounds. Abdomen:     Bowel sounds positive,abdomen soft and non-tender without masses, organomegaly or hernias noted. Msk:     No deformity or scoliosis noted of thoracic or lumbar spine.   Pulses:     R and L carotid,radial,femoral,dorsalis pedis and posterior tibial pulses are full and equal bilaterally Extremities:     No clubbing, cyanosis, edema, or deformity noted with normal full range of motion of all joints.   Neurologic:     No cranial nerve deficits noted. Station and gait are normal. Plantar reflexes are down-going bilaterally. DTRs are symmetrical throughout. Sensory, motor and coordinative functions appear intact. Skin:     Intact without suspicious lesions or rashes  she has a sebaceous cyst in her groin.  The plug was popped out manually. Cervical Nodes:     No lymphadenopathy noted Axillary Nodes:     No palpable lymphadenopathy Inguinal Nodes:     No significant adenopathy Psych:     Cognition and judgment appear intact. Alert and cooperative with normal attention span and concentration. No apparent delusions, illusions, hallucinations    Impression & Recommendations:  Problem # 1:  SINUSITIS, CHRONIC, NOS (ICD-473.9) Assessment: Unchanged  Problem # 2:  ASTHMA, UNSPECIFIED (ICD-493.90) Assessment: Improved  Her updated medication list for this problem includes:    Singulair 10 Mg Tabs (Montelukast sodium) .Marland Kitchen... Take 1 tablet by mouth once a day   Problem # 3:  DEPRESSIVE DISORDER, NOS (ICD-311) Assessment: Improved  Her updated medication list for this problem includes:    Trazodone Hcl 150 Mg Tabs (Trazodone hcl) .Marland Kitchen... 1/2 tab at bedtime    Sertraline Hcl 100 Mg Tabs (Sertraline hcl) .Marland Kitchen... 1/2 once daily   Problem # 4:  OSTEOPENIA (ICD-733.90) Assessment: Unchanged  Complete Medication List: 1)  Estratest H.s. 0.625-1.25 Mg Tabs (Est estrogens-methyltest) .Marland Kitchen.. 1 once daily 2)  Levothyroxine Sodium 175 Mcg Tabs (Levothyroxine sodium) .Marland Kitchen.. 1  once daily 3)  Trazodone Hcl 150 Mg Tabs (Trazodone hcl) .... 1/2 tab at bedtime 4)  Sertraline Hcl 100 Mg Tabs (Sertraline hcl) .... 1/2 once daily 5)  Singulair 10 Mg Tabs (Montelukast sodium) .... Take 1 tablet by mouth once a day  Other Orders: Pneumococcal Vaccine ([address]) Admin 1st Vaccine ([address])   Patient Instructions: 1)  Jo Gomez was advised to maintain her good health habits.  We discussed getting off the hormone replacement therapy and using that.  She tabs for vaginal dryness.  We also discussed allergic rhinitis.  No medication is really helped her so will hold off on that.  All her medications were renewed and sent N. electronically except the Estratest, which is given to her by her  gynecologist.  She was given a Pneumovax because of her underlying IgA deficiency and asked to come back and see [place] on a yearly basis for physical evaluation. 2)  Please schedule a follow-up appointment in 1 year. 3)  Take calcium +Vitamin D daily. 4)  Take an Aspirin every day.    Prescriptions: SINGULAIR 10 MG  TABS (MONTELUKAST SODIUM) Take 1 tablet by mouth once a day  #100 x 3   Entered and Authorized by:   Roderick Pee MD   Signed by:   Roderick Pee MD on 03/25/2007   Method used:   Electronically sent to ...       [facility] [phone] [address]*       [address]       [place], [place]  [address]       Ph: [phone]       Fax: [phone]   RxID:   [id number] SERTRALINE HCL 100 MG  TABS (SERTRALINE HCL) 1/2 once daily  #50 x 3   Entered and Authorized by:   Roderick Pee MD   Signed by:   Roderick Pee MD on 03/25/2007   Method used:   Electronically sent to ...       [facility] [phone] [address]*       [address]       [place], [place]  [address]       Ph: [phone]       Fax: [phone]   RxID:   [id number] TRAZODONE HCL 150 MG  TABS (TRAZODONE HCL) 1/2 tab at bedtime  #50 x 3   Entered and  Authorized by:   Roderick Pee MD   Signed by:   Roderick Pee MD on 03/25/2007   Method used:   Electronically sent to ...       [facility] [phone] [address]*       [address]       [place], [place]  [address]       Ph: [phone]       Fax: [phone]   RxID:   [id number] LEVOTHYROXINE SODIUM 175 MCG  TABS (LEVOTHYROXINE SODIUM) 1 once daily  #100 x 3   Entered and Authorized by:   Roderick Pee MD   Signed by:   Roderick Pee MD on 03/25/2007   Method used:   Electronically sent to ...       [facility] #[id number]       [address]       [place], [place]  [address]       Ph: [phone]       Fax: [phone]   RxID:   [id number]        Tetanus/Td Immunization History:    Tetanus/Td # 1:  Td (08/24/2002)  Pneumovax Immunization History:    Pneumovax # 1:  Pneumovax (03/25/2007)  Pneumovax Vaccine    Vaccine Type: Pneumovax    Site: right deltoid    Mfr: Merck    Dose: 0.5 ml    Route: IM    Given by: Arcola Jansky, RN    Exp. Date: 05/25/2008    Lot #: [id number]    VIS given: 03/21/96 version given March 25, 2007.  Laboratory  Results        Orders Added: 1)  Pneumococcal Vaccine [90732] 2)  Admin 1st Vaccine [90471] 3)  Est. Patient Level IV [[address]]

## 2010-09-23 NOTE — Assessment & Plan Note (Signed)
Summary: bronchitus/cjr   Vital Signs:  Patient profile:   65 year old female Menstrual status:  hysterectomy Weight:      111 pounds Temp:     98.9 degrees F oral BP sitting:   120 / 70  (left arm)  Vitals Entered By: Kern Reap CMA Duncan Dull) (July 31, 2010 1:49 PM) CC: continued cough   Primary Care Provider:  Kelle Darting, MD   CC:  continued cough.  History of Present Illness: Jo Gomez is a delightful, 65 year old female, who comes in today for evaluation of a cough x 6 months.  She has a history of allergic rhinitis and she was diagnosed to have some mild asthma as an adult.  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  Recently, she had a complete GI workup.  It was found to have a significant hiatal hernia.  She has an appointment to see Dr. Daphine Deutscher for surgical consultation.  She has no fever no sputum production, and she takes Nexium 40 mg b.i.d.  She wonders if this could be something else wrong with her lungs.  She also states that her father died of lung disease and had a similar IgA deficiency.  Pulmonary his systems otherwise negative.  Recent mammogram, screening she got a recall card.  She complains about some soreness in her left breast at the 6 o'clock position  Allergies: 1)  ! * Sulfa,teracycline,codeine 2)  ! * Clarithromycin 3)  ! Tetracycline 4)  ! Sulfa  Past History:  Past medical, surgical, family and social histories (including risk factors) reviewed for relevance to current acute and chronic problems.  Past Medical History: Reviewed history from 07/03/2010 and no changes required. follicular adenoma R lobe thyroid, Herpes simplex labialis hypothyroidism osteo- arthritis depression allergic rhinitis  ? asthma sleep dysfunction hysterectomy 15 years ago for nonmalignant reasons.  Ovaries were left intact IGA deficiency  GERD Hemorrhoids Hiatal Hernia   Past Surgical History: Reviewed history from 10/21/2006 and no changes required. 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 Breast CA - mother,  IgA deficiency-father, MI age 68 mother, MA age 88, osteoporosis - mother, RA - mother  pseudogout Family History of Colon Polyps:Father  Family History of Colitis: Mother  No FH of Colon Cancer:  Social History: Reviewed history from 03/04/2010 and no changes required. Occupation: Retired Runner, broadcasting/film/video  Divorced Never Smoked Alcohol use-yes: 2 cups daily  Drug use-no Regular exercise-yes Daily Caffeine Use: 4 cups of coffee daily   Review of Systems      See HPI  Physical Exam  General:  Well-developed,well-nourished,in no acute distress; alert,appropriate and cooperative throughout examination Neck:  No deformities, masses, or tenderness noted. Chest Wall:  No deformities, masses, or tenderness noted. Breasts:  right breast normal has some tenderness in the left breast at the 6 o'clock position Lungs:  Normal respiratory effort, chest expands symmetrically. Lungs are clear to auscultation, no crackles or wheezes. Heart:  Normal rate and regular rhythm. S1 and S2 normal without gallop, murmur, click, rub or other extra sounds.   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 1/2 tab at bedtime as needed 2)  Sertraline Hcl 100 Mg Tabs (Sertraline hcl) .Marland Kitchen.. 1 tab @ bedtime 3)   Multivitamins Tabs (Multiple vitamin) .... Once daily 4)  Calcium 600 1500 Mg Tabs (Calcium carbonate) .... Two times a day 5)  Synthroid 75 Mcg Tabs (Levothyroxine sodium) .... Take 1 tablet by mouth every morning 6)  Acyclovir 200 Mg Caps (Acyclovir) .... Take 3 caps in the am and 2 caps in the pm as needed 7)  Estratest 1.25  .... 1/3 tablet by mouth once daily 8)  Nexium 40 Mg Cpdr (Esomeprazole magnesium) .Marland Kitchen.. 1 caps two times a day. 9)  Cobal-1000 1000 Mcg/ml Soln (Cyanocobalamin) .Marland Kitchen.. 1 cc monthly 10)  Bd Disp Needles 25g X 5/8" Misc (Needle (disp)) .... Uad 11)  Tums Smoothies 750 Mg Chew (Calcium carbonate antacid) .... As needed  Patient Instructions: 1)  I think the cough is really related to the chronic reflux.  Let's go ahead and get a chest x-ray just to be sure everything else is okay.  Continue the antireflux program as outlined, including nothing to eat or drink for 3 hours prior to bedtime   Orders Added: 1)  T-2 View CXR [71020TC] 2)  Spirometry w/Graph [94010] 3)  Est. Patient Level IV [40102]

## 2010-09-23 NOTE — Procedures (Signed)
Summary: Colon   Colonoscopy  Procedure date:  09/12/2004  Findings:      Location:  [facility]   NAME:  Jo Gomez, Jo Gomez            ACCOUNT NO.:  [id number]   MEDICAL RECORD NO.:  [id number]          PATIENT TYPE:  AMB   LOCATION:  ENDO                         FACILITY:  [facility]   PHYSICIAN:  James L. Malon Kindle., M.D.DATE OF BIRTH:  1945/12/15   DATE OF PROCEDURE:  09/12/2004  DATE OF DISCHARGE:                                 OPERATIVE REPORT   PROCEDURE:  Colonoscopy.   MEDICATIONS:  Fentanyl 100 mcg, Versed 2 mg IV.   SCOPE:  Olympus pediatric adjustable colonoscope.   INDICATIONS:  Patient with intermittent rectal bleeding with a strong family  history of colon cancer.  Had a colonoscopy 3-4 years ago due to rectal  bleeding.  This is done somewhat sooner.   DESCRIPTION OF PROCEDURE:  The procedure had been explained to the patient  and consent obtained.  With the patient in the left lateral decubitus  position, the Olympus pediatric adjustable scope was inserted and advanced.  The prep was excellent.  She had a very long, tortuous colon.  Multiple  maneuvers, including abdominal pressure and positional changes were  required.  We were able to reach the cecum.  The ileocecal valve seen.  The  scope withdrawn.  The cecum, ascending colon, transverse colon, splenic  flexure, descending and sigmoid colon were seen well.  No polyps or other  lesions were seen.  The scope was withdrawn.  The patient tolerated the  procedure well.   ASSESSMENT:  1.  Strong family history of colon cancer.  V16.0.  2.  Rectal bleeding.  578.1.  Probably due to internal hemorrhoids.   PLAN:  We will recommend yearly Hemoccults and repeat procedure in five  years.      JLE/MEDQ  D:  09/12/2004  T:  09/12/2004  Job:  [id number]   cc:   Deniece Portela A. Sheffield Slider, M.D.  Fax: [phone]

## 2010-09-23 NOTE — Assessment & Plan Note (Signed)
Summary: 2nd weekly b-12 inj/all  Nurse Visit   Allergies: 1)  ! * Sulfa,teracycline,codeine 2)  ! * Clarithromycin 3)  ! Tetracycline 4)  ! Sulfa  Medication Administration  Injection # 1:    Medication: Vit B12 1000 mcg    Diagnosis: VITAMIN B12 DEFICIENCY (ICD-266.2)    Route: IM    Site: L deltoid    Exp Date: 09/25/2011    Lot #: [id number]    Mfr: [employer]    Patient tolerated injection without complications    Given by: Harlow Mares CMA (AAMA) (March 13, 2010 10:33 AM)  Orders Added: 1)  Vit B12 1000 mcg [J3420]

## 2010-09-23 NOTE — Progress Notes (Signed)
Summary: trazodone refill  Phone Note From Pharmacy   Summary of Call: patient is requesting a refill of trazodone.  last cpx 8/10 is this okay to fill? Initial call taken by: Kern Reap CMA Duncan Dull),  May 13, 2009 12:38 PM  Follow-up for Phone Call        okay for one year Follow-up by: Roderick Pee MD,  May 13, 2009 12:43 PM  Additional Follow-up for Phone Call Additional follow up Details #1::        rx sent Additional Follow-up by: Kern Reap CMA Duncan Dull),  May 13, 2009 1:54 PM

## 2010-09-23 NOTE — Miscellaneous (Signed)
Summary: clotest  Clinical Lists Changes  Orders: Added new Test order of TLB-H Pylori Screen Gastric Biopsy (83013-CLOTEST) - Signed 

## 2010-09-23 NOTE — Assessment & Plan Note (Signed)
Summary: 3 of 3 weekly b12/lk  Nurse Visit   Allergies: 1)  ! * Sulfa,teracycline,codeine 2)  ! * Clarithromycin 3)  ! Tetracycline 4)  ! Sulfa  Medication Administration  Injection # 1:    Medication: Vit B12 1000 mcg    Diagnosis: VITAMIN B12 DEFICIENCY (ICD-266.2)    Route: IM    Site: L deltoid    Exp Date: 12/23/2011    Lot #: [id number]    Mfr: [employer]    Comments: I have sent a rx for Nascobal and she will see how much it is with her insurance if the cost is too much she will get once a month injections here at our office she will call back to let us know if she chooses not to ise the nasal b12 due to cost. I gave her some patient information and a discount card today while she was in the office.     Patient tolerated injection without complications    Given by: Harlow Mares CMA (AAMA) (March 20, 2010 10:15 AM) Prescriptions: NASCOBAL 500 MCG/0.1ML SOLN (CYANOCOBALAMIN) one spray, one nostril, once a week  #1 bottle x 6   Entered by:   Harlow Mares CMA (AAMA)   Authorized by:   Mardella Layman MD [facility]   Signed by:   Harlow Mares CMA (AAMA) on 03/20/2010   Method used:   Electronically to        [facility]. [phone]* (retail)       [address]       [place], [place]  [address]       Ph: [phone]       Fax: [phone]   RxID:   [id number]

## 2010-09-23 NOTE — Letter (Signed)
Summary: [facility]   Imported By: Lester Bronson 03/10/2010 08:57:34  _____________________________________________________________________  External Attachment:    Type:   Image     Comment:   External Document

## 2010-09-23 NOTE — Assessment & Plan Note (Signed)
Summary: #1 of 3 weekly B12 inj/dfs/266.2  Nurse Visit   Allergies: 1)  ! * Sulfa,teracycline,codeine 2)  ! * Clarithromycin 3)  ! Tetracycline 4)  ! Sulfa  Medication Administration  Injection # 1:    Medication: Vit B12 1000 mcg    Diagnosis: VITAMIN B12 DEFICIENCY (ICD-266.2)    Route: IM    Site: L deltoid    Exp Date: 07/2012    Lot #: [id number]    Mfr: [employer]    Patient tolerated injection without complications    Given by: Christie Nottingham CMA (AAMA) (March 06, 2010 10:14 AM)  Orders Added: 1)  Vit B12 1000 mcg [J3420]

## 2010-09-23 NOTE — Letter (Signed)
Summary: [facility]  [facility]   Imported By: Lester Betterton 03/10/2010 08:56:24  _____________________________________________________________________  External Attachment:    Type:   Image     Comment:   External Document

## 2010-09-23 NOTE — Letter (Signed)
Summary: New Patient letter  [facility]  [address] [place], [place] [address]   Phone: [phone]  Fax: [phone]       02/25/2010 MRN: [id number]  Jo Gomez [address] [place], [place]  [address]  Dear [name],  Welcome to the Gastroenterology Division at [facility].    You are scheduled to see Dr.  Jarold Motto on 03-31-10 at 8:30a.m. on the 3rd floor at [facility], [address]  We ask that you try to arrive at our office 15 minutes prior to your appointment time to allow for check-in.  We would like you to complete the enclosed self-administered evaluation form prior to your visit and bring it with you on the day of your appointment.  We will review it with you.  Also, please bring a complete list of all your medications or, if you prefer, bring the medication bottles and we will list them.  Please bring your insurance card so that we may make a copy of it.  If your insurance requires a referral to see a specialist, please bring your referral form from your primary care physician.  Co-payments are due at the time of your visit and may be paid by cash, check or credit card.     Your office visit will consist of a consult with your physician (includes a physical exam), any laboratory testing he/she may order, scheduling of any necessary diagnostic testing (e.g. x-ray, ultrasound, CT-scan), and scheduling of a procedure (e.g. Endoscopy, Colonoscopy) if required.  Please allow enough time on your schedule to allow for any/all of these possibilities.    If you cannot keep your appointment, please call [phone] to cancel or reschedule prior to your appointment date.  This allows [place] the opportunity to schedule an appointment for another patient in need of care.  If you do not cancel or reschedule by 5 p.m. the business day prior to your appointment date, you will be charged a $50.00 late cancellation/no-show fee.    Thank you for choosing  [facility] for your medical needs.  We appreciate the opportunity to care for you.  Please visit [place] at our website  to learn more about our practice.                     Sincerely,                                                             The Gastroenterology Division

## 2010-09-23 NOTE — Procedures (Signed)
Summary: Ledon Snare MD  Colon/James Cora Collum MD   Imported By: Lester Rankin 03/10/2010 08:55:08  _____________________________________________________________________  External Attachment:    Type:   Image     Comment:   External Document

## 2010-09-23 NOTE — Assessment & Plan Note (Signed)
Summary: antibiotic makes her hurt all over/mhf   Vital Signs:  Patient Profile:   65 Years Old Female Weight:      117 pounds (53.18 kg) Temp:     97.9 degrees F (36.61 degrees C) oral Pulse rate:   78 / minute BP sitting:   136 / 72  (right arm)  Pt. in pain?   yes    Location:   muscles& joints    Intensity:   5-7    Type:       aching  Vitals Entered By: Arcola Jansky, RN (September 05, 2007 12:05 PM)                  Chief Complaint:  antibiotic levoquin 750 mg- severe muscle pain all over body, shoulders, mobility, and high b/p.  History of Present Illness: Jo Gomez is a 65 year old female, who comes in today for follow-up of sinusitis.  She was diagnosed as sinusitis based on history.  No x-rays or CT was done.  She was given Levaquin, but then developed muscle, joint and ligamentous pain after a days, and medication.  She stopped taking the medication and comes in for reevaluation.  She says that most of her facial pain is gone.  She has slight congestion.  Otherwise well.  No fever, et Karie Soda.     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  IgA deficiency       mother died 82 lungs cancer, MI, osteoporosis       one brother in good health  Social History:    Reviewed history and no changes required:       Occupation:  Runner, broadcasting/film/video       Divorced       Never Smoked       Alcohol use-no       Drug use-no       Regular exercise-yes   Risk Factors:  Tobacco use:  never Drug use:  no Alcohol use:  no Exercise:  yes   Review of Systems      See HPI   Physical Exam  General:     Well-developed,well-nourished,in no acute distress;  alert,appropriate and cooperative throughout examination Head:     Normocephalic and atraumatic without obvious abnormalities. No apparent alopecia or balding. Eyes:     No corneal or conjunctival inflammation noted. EOMI. Perrla. Funduscopic exam benign, without hemorrhages, exudates or papilledema. Vision grossly normal. Ears:     External ear exam shows no significant lesions or deformities.  Otoscopic examination reveals clear canals, tympanic membranes are intact bilaterally without bulging, retraction, inflammation or discharge. Hearing is grossly normal bilaterally. Nose:     External nasal examination shows no deformity or inflammation. Nasal mucosa are pink and moist without lesions or exudates. Mouth:     Oral mucosa and oropharynx without lesions or exudates.  Teeth in good repair.    Impression & Recommendations:  Problem #  1:  ALLERGIC RHINITIS (ICD-477.9) Assessment: Improved  Problem # 2:  DEPRESSIVE DISORDER NOT ELSEWHERE CLASSIFIED (ICD-311) Assessment: Improved  Problem # 3:  HYPOTHYROIDISM (ICD-244.9) Assessment: Improved   Patient Instructions: 1)  continue current medications return when due for her next exam    ]

## 2010-09-23 NOTE — Progress Notes (Signed)
Summary: Triage  Phone Note Call from Patient Call back at Home Phone [phone]   Caller: Patient Call For: Dr. Jarold Motto Reason for Call: Talk to Nurse Details for Reason: Triage Summary of Call: Pt. calls still complaining of chronic stomach pain, pain in esophagus, and reflux with symptoms no better at all. Please call. Initial call taken by: Schuyler Amor,  July 01, 2010 9:52 AM  Follow-up for Phone Call        patient reports meds are not helping continues to have the same symptoms.  Patient  is scheduled to see Dr Jarold Motto 07/03/10 10:00 Follow-up by: Darcey Nora RN, CGRN,  July 01, 2010 10:54 AM

## 2010-09-23 NOTE — Assessment & Plan Note (Signed)
Summary: consult re: suspicious mole and getting addtl labs added to c...   Vital Signs:  Patient profile:   65 year old female Menstrual status:  hysterectomy Height:      59.75 inches Weight:      113 pounds Temp:     98.1 degrees F oral BP sitting:   120 / 72  (left arm) Cuff size:   regular  Vitals Entered By: Kathrynn Speed CMA (April 03, 2010 11:24 AM) CC: supicious mole on left hand, discuss lab work that was done, src   Primary Care Provider:  Kelle Darting, MD   CC:  supicious mole on left hand, discuss lab work that was done, and src.  History of Present Illness: Dalaina is a 66 year old female, who comes in today for evaluation of 3 problems.  She has a mole on her left hand and her left eyelid.  She would like checked.  She's been evaluated by Dr. Jarold Motto for symptoms of bloating and indigestion and abdominal pain.  Diagnostic workup to date is negative except for B12 deficiency.  He has run a B12, nasal spray, she would like to discuss deep alternatives.  We discussed the injectable pain.  I showed her how to use it and gave her a CC in her left leg.  She would like to continue the injectable type of B12 supplementation.  All other labs were normal.  she'll need for her pre-physical lab would be a lipid panel.  Current Medications (verified): 1)  Trazodone Hcl 150 Mg  Tabs (Trazodone Hcl) .... 1/2 Tab At Bedtime As Needed 2)  Sertraline Hcl 100 Mg  Tabs (Sertraline Hcl) .Marland Kitchen.. 1 Tab @ Bedtime 3)  Multivitamins   Tabs (Multiple Vitamin) .... Once Daily 4)  Calcium 600 1500 Mg  Tabs (Calcium Carbonate) .... Two Times A Day 5)  Synthroid 75 Mcg Tabs (Levothyroxine Sodium) .... Take 1 Tablet By Mouth Every Morning 6)  Acyclovir 200 Mg Caps (Acyclovir) .... Take 3 Caps in The Am and 2 Caps in The Pm As Needed 7)  Estratest 1.25 .... 1/3 Tablet By Mouth Once Daily 8)  Nexium 40 Mg  Cpdr (Esomeprazole Magnesium) .Marland Kitchen.. 1 Capsule Each Day 30 Minutes Before Meal 9)  Carafate 1  Gm/77ml  Susp (Sucralfate) .Marland Kitchen.. 1 Tbsp Pc and Hs 10)  Levsin/sl 0.125 Mg  Subl (Hyoscyamine Sulfate) .Marland Kitchen.. 1 Sl 1 4-6 Hrs As Needed 11)  Nascobal 500 Mcg/0.42ml Soln (Cyanocobalamin) .... One Spray, One Nostril, Once A Week  Allergies (verified): 1)  ! * Sulfa,teracycline,codeine 2)  ! * Clarithromycin 3)  ! Tetracycline 4)  ! Sulfa  Past History:  Past medical, surgical, family and social histories (including risk factors) reviewed for relevance to current acute and chronic problems.  Past Medical History: Reviewed history from 10/18/2009 and no changes required. follicular adenoma R lobe thyroid, Herpes simplex labialis hypothyroidism osteo- arthritis depression allergic rhinitis  ? asthma sleep dysfunction hysterectomy 15 years ago for nonmalignant reasons.  Ovaries were left intact IGA deficiency   Past Surgical History: Reviewed history from 10/21/2006 and no changes required. 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 Breast CA - mother,  IgA deficiency-father, MI age 2 mother, MA age 52, osteoporosis - mother, RA - mother  pseudogout Family History of Colon Polyps:Father  Family History of Colitis: Mother  No FH of Colon Cancer:  Social History: Reviewed history from 03/04/2010 and no changes required. Occupation: Retired Runner, broadcasting/film/video  Divorced Never Smoked Alcohol use-yes: 2 cups daily  Drug use-no Regular exercise-yes Daily Caffeine Use: 4 cups of coffee daily   Review of Systems      See HPI  Physical Exam  General:  Well-developed,well-nourished,in no acute distress; alert,appropriate and cooperative throughout examination Skin:  the lesion on her left hand as a benign seborrheic keratosis, the lesion in her left  eyelid is a benign mole.   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 1/2 tab at bedtime as needed 2)  Sertraline Hcl 100 Mg Tabs (Sertraline hcl) .Marland Kitchen.. 1 tab @ bedtime 3)  Multivitamins Tabs (Multiple vitamin) .... Once daily 4)  Calcium 600 1500 Mg Tabs (Calcium carbonate) .... Two times a day 5)  Synthroid 75 Mcg Tabs (Levothyroxine sodium) .... Take 1 tablet by mouth every morning 6)  Acyclovir 200 Mg Caps (Acyclovir) .... Take 3 caps in the am and 2 caps in the pm as needed 7)  Estratest 1.25  .... 1/3 tablet by mouth once daily 8)  Nexium 40 Mg Cpdr (Esomeprazole magnesium) .Marland Kitchen.. 1 capsule each day 30 minutes before meal 9)  Carafate 1 Gm/69ml Susp (Sucralfate) .Marland Kitchen.. 1 tbsp pc and hs 10)  Levsin/sl 0.125 Mg Subl (Hyoscyamine sulfate) .Marland Kitchen.. 1 sl 1 4-6 hrs as needed 11)  Cobal-1000 1000 Mcg/ml Soln (Cyanocobalamin) .Marland Kitchen.. 1 cc monthly 12)  Bd Insulin Syringe 25g X 5/8" 1 Ml Misc (Insulin syringe-needle u-100) .... Uad for b12  Other Orders: Venipuncture (93235) TLB-Lipid Panel (80061-LIPID) Specimen Handling (57322) Venipuncture (02542)  Patient Instructions: 1)  1 cc of vitamin B12 subcutaneously monthly forever. 2)  All your labs have been done by Dr. Jarold Motto.  The only thing we need to get is a lipid panel Prescriptions: BD INSULIN SYRINGE 25G X 5/8" 1 ML MISC (INSULIN SYRINGE-NEEDLE U-100) UAD for b12  #12 x 1   Entered and Authorized by:   Roderick Pee MD   Signed by:   Roderick Pee MD on 04/03/2010   Method used:   Electronically to        [facility]. [phone]* (retail)       [address]       [place], [place]  [address]        Ph: [phone]       Fax: [phone]   RxID:   [id number] COBAL-1000 1000 MCG/ML SOLN (CYANOCOBALAMIN) 1 cc monthly  #30 cc x 1   Entered and Authorized by:   Roderick Pee MD   Signed by:   Roderick Pee MD on 04/03/2010   Method used:   Electronically to        [facility]. [phone]* (retail)       [address]       [place], [place]  [address]       Ph: [phone]       Fax: [phone]   RxID:   [id number]

## 2010-09-23 NOTE — Assessment & Plan Note (Signed)
Summary: reflux, chronic abdominal pain, pain in esophagus/sheri   History of Present Illness Visit Type: Follow-up Visit Primary GI MD: Sheryn Bison MD FACP FAGA Primary Provider: Kelle Darting, MD  Requesting Provider: na Chief Complaint: Severe epigastric pain, severe GERD,  loss of appetite, and nausea  History of Present Illness:   Very Pleasant 65 year old Caucasian female that has continued rather severe regurgitation and burning substernal chest pain despite Nexium 40 mg twice a day.  Endoscopy was fairly unremarkable. Esophageal manometry showed borderline lower esophageal sphincter incompetency with pressure at 15 mm of mercury. There was no evidence of a motility disorder. 24-hour pH probe testing showed rather marked acid reflux in the supine and recumbent position as per my report. There is good correlation with her coughing and reflux symptoms with acid regurgitation.  He continues with coughing, choking, typical reflux symptoms. She also describes early satiety with mild anorexia and nausea. There is no history of pancreatitis, hepatitis, or small bowel obstruction, and labs otherwise are unremarkable as is upper abdominal ultrasound. She does not have Raynaud's phenomenon or collagen vascular disease. She follows a very strict antireflux regime.   GI Review of Systems    Reports abdominal pain, acid reflux, belching, heartburn, loss of appetite, and  nausea.     Location of  Abdominal pain: epigastric area.    Denies bloating, chest pain, dysphagia with liquids, dysphagia with solids, vomiting, vomiting blood, weight loss, and  weight gain.        Denies anal fissure, black tarry stools, change in bowel habit, constipation, diarrhea, diverticulosis, fecal incontinence, heme positive stool, hemorrhoids, irritable bowel syndrome, jaundice, light color stool, liver problems, rectal bleeding, and  rectal pain.    Current Medications (verified): 1)  Trazodone Hcl 150 Mg  Tabs  (Trazodone Hcl) .... 1/2 Tab At Bedtime As Needed 2)  Sertraline Hcl 100 Mg  Tabs (Sertraline Hcl) .Marland Kitchen.. 1 Tab @ Bedtime 3)  Multivitamins   Tabs (Multiple Vitamin) .... Once Daily 4)  Calcium 600 1500 Mg  Tabs (Calcium Carbonate) .... Two Times A Day 5)  Synthroid 75 Mcg Tabs (Levothyroxine Sodium) .... Take 1 Tablet By Mouth Every Morning 6)  Acyclovir 200 Mg Caps (Acyclovir) .... Take 3 Caps in The Am and 2 Caps in The Pm As Needed 7)  Estratest 1.25 .... 1/3 Tablet By Mouth Once Daily 8)  Nexium 40 Mg  Cpdr (Esomeprazole Magnesium) .Marland Kitchen.. 1 Caps Two Times A Day. 9)  Cobal-1000 1000 Mcg/ml Soln (Cyanocobalamin) .Marland Kitchen.. 1 Cc Monthly 10)  Bd Disp Needles 25g X 5/8" Misc (Needle (Disp)) .... Uad 11)  Tums Smoothies 750 Mg Chew (Calcium Carbonate Antacid) .... As Needed  Allergies (verified): 1)  ! * Sulfa,teracycline,codeine 2)  ! * Clarithromycin 3)  ! Tetracycline 4)  ! Sulfa  Past History:  Past medical, surgical, family and social histories (including risk factors) reviewed for relevance to current acute and chronic problems.  Past Medical History: follicular adenoma R lobe thyroid, Herpes simplex labialis hypothyroidism osteo- arthritis depression allergic rhinitis  ? asthma sleep dysfunction hysterectomy 15 years ago for nonmalignant reasons.  Ovaries were left intact IGA deficiency  GERD Hemorrhoids Hiatal Hernia   Past Surgical History: Reviewed history from 10/21/2006 and no changes required. 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 Breast  CA - mother,  IgA deficiency-father, MI age 3 mother, MA age 63, osteoporosis - mother, RA - mother  pseudogout Family History of Colon Polyps:Father  Family History of Colitis: Mother    No FH of Colon Cancer:  Social History: Reviewed history from 03/04/2010 and no changes required. Occupation: Retired Runner, broadcasting/film/video  Divorced Never Smoked Alcohol use-yes: 2 cups daily  Drug use-no Regular exercise-yes Daily Caffeine Use: 4 cups of coffee daily   Review of Systems       The patient complains of allergy/sinus, cough, and voice change.  The patient denies anemia, anxiety-new, arthritis/joint pain, back pain, blood in urine, breast changes/lumps, change in vision, confusion, coughing up blood, depression-new, fainting, fatigue, fever, headaches-new, hearing problems, heart murmur, heart rhythm changes, itching, menstrual pain, muscle pains/cramps, night sweats, nosebleeds, pregnancy symptoms, shortness of breath, skin rash, sleeping problems, sore throat, swelling of feet/legs, swollen lymph glands, thirst - excessive , urination - excessive , urination changes/pain, urine leakage, and vision changes.   ENT:  Complains of nasal congestion and hoarseness.  Vital Signs:  Patient profile:   65 year old female Menstrual status:  hysterectomy Height:      59.75 inches Weight:      113 pounds BMI:     22.33 BSA:     1.46 Pulse rate:   76 / minute Pulse rhythm:   regular BP sitting:   124 / 68  (left arm) Cuff size:   regular  Vitals Entered By: Ok Anis CMA (July 03, 2010 10:08 AM)  Physical Exam  General:  Well developed, well nourished, no acute distress.healthy appearing.  healthy appearing.   Head:  Normocephalic and atraumatic. Eyes:  PERRLA, no icterus.exam deferred to patient's ophthalmologist.  exam deferred to patient's ophthalmologist.   Abdomen:  Soft, nontender and nondistended. No masses, hepatosplenomegaly or hernias noted. Normal bowel sounds.No succussion splash noted. Neurologic:  Alert and  oriented x4;  grossly normal neurologically. Psych:  Alert and cooperative. Normal mood and affect.   Impression & Recommendations:  Problem # 1:   ESOPHAGEAL REFLUX (ICD-530.81) Assessment Unchanged Despite b.i.d. Nexium, she continues to be markedly symptomatic with regurgitation and ENT problems. I suspect she has an element of gastroparesis and will need domperidone therapy. We will check gastric emptying scan and proceed accordingly. If her scan is normal, consideration of laparoscopic fundoplication has been suggested. In the interim, she is to continue her reflux regime and b.i.d. Nexium therapy. Orders: Gastric Emptying Scan (GES)  Problem # 2:  GASTROPARESIS (ICD-536.3) Assessment: Unchanged Emptying Scan Ordered. Orders: Gastric Emptying Scan (GES)  Problem # 3:  VITAMIN B12 DEFICIENCY (ICD-266.2) Assessment: Improved Continue parenteral replacement therapy.  Patient Instructions: 1)  You have been scheduled for a Gastric Empyting Scan.  2)  Please continue current medications.  3)  Copy sent to : Kelle Darting, MD and Dr. Luretha Murphy at [facility] 4)  The medication list was reviewed and reconciled.  All changed / newly prescribed medications were explained.  A complete medication list was provided to the patient / caregiver. 5)  Avoid foods high in acid content ( tomatoes, citrus juices, spicy foods) . Avoid eating within 3 to 4 hours of lying down or before exercising. Do not over eat; try smaller more frequent meals. Elevate head of bed four inches when sleeping.

## 2010-09-23 NOTE — Assessment & Plan Note (Signed)
Summary: fever, wheezing, productive green cough/dm   Vital Signs:  Patient profile:   65 year old female Menstrual status:  hysterectomy Weight:      117 pounds O2 Sat:      98 % on Room air Temp:     99.4 degrees F oral Pulse rate:   86 / minute BP sitting:   120 / 80  (left arm) Cuff size:   regular  Vitals Entered By: Romualdo Bolk, CMA (AAMA) (October 18, 2009 3:18 PM)  O2 Flow:  Room air CC: Coughing, congestion, wheezing, sob, fever last night. This has been going for 1 week but coughing for 1 month.   History of Present Illness: Jo Gomez comesin today for   acute sda because of worsening of resp status    Has Ig A deficiency and resp issues and  has  had a  Cough  off an on for months and then a week ago got a cold.  now green phlegm .   Too sick yesterday   to come in  Fever over 100  for a few days.  no cp or sob today . No using inhaler .  No hemoptysis. ? if has asthma  no recent rx for such .  Doesnt get flu shots  tole it may not help her.   Preventive Screening-Counseling & Management  Alcohol-Tobacco     Smoking Status: never     Passive Smoke Exposure: no  Caffeine-Diet-Exercise     Does Patient Exercise: yes     Type of exercise: walk     Times/week: 5  Current Medications (verified): 1)  Trazodone Hcl 150 Mg  Tabs (Trazodone Hcl) .... 1/2 Tab At Bedtime As Needed 2)  Sertraline Hcl 100 Mg  Tabs (Sertraline Hcl) .Marland Kitchen.. 1 Tab @ Bedtime 3)  Baby Aspirin 81 Mg  Chew (Aspirin) .... Once Daily 4)  Multivitamins   Tabs (Multiple Vitamin) .... Once Daily 5)  Calcium 600 1500 Mg  Tabs (Calcium Carbonate) .... Two Times A Day 6)  Synthroid 75 Mcg Tabs (Levothyroxine Sodium) .... Take 1 Tablet By Mouth Every Morning 7)  Acyclovir 200 Mg Caps (Acyclovir) .... Take 3 Caps in The Am and 2 Caps in The Pm As Needed  Allergies (verified): 1)  ! * Sulfa,teracycline,codeine 2)  ! * Clarithromycin 3)  ! Tetracycline 4)  ! Sulfa  Past  History:  Past Medical History: follicular adenoma R lobe thyroid, Herpes simplex labialis hypothyroidism osteo- arthritis depression allergic rhinitis  ? asthma sleep dysfunction hysterectomy 15 years ago for nonmalignant reasons.  Ovaries were left intact IGA deficiency   Family History: Breast CA - mother, colon cancer, IgA deficiency-father, MI age 48 mother, MA age 27, osteoporosis - mother, RA - mother  pseudogout  Review of Systems       The patient complains of anorexia, fever, and prolonged cough.  The patient denies vision loss, decreased hearing, chest pain, syncope, peripheral edema, hemoptysis, abdominal pain, difficulty walking, abnormal bleeding, enlarged lymph nodes, and angioedema.         uses saline washes to prevent  sinusitis   Physical Exam  General:  alert, well-developed, well-nourished, and well-hydrated.   Head:  normocephalic, atraumatic, and no abnormalities observed.   Eyes:  vision grossly intact, pupils equal, and pupils round.   Ears:  R ear normal, L ear normal, and no external deformities.   Nose:  no external deformity and no external erythema.  congested  Mouth:  pharynx  pink and moist.   Neck:  No deformities, masses, or tenderness noted. Lungs:  normal respiratory effort, no intercostal retractions, no accessory muscle use, no dullness, no fremitus, no crackles, and no wheezes.  ? few rhonchi at bases Heart:  normal rate, regular rhythm, no murmur, no gallop, and no lifts.   Pulses:  nl cap refill Extremities:  no clubbing cyanosis or edema  Neurologic:  non focal  Skin:  turgor normal, color normal, no ecchymoses, and no petechiae.   Cervical Nodes:  No lymphadenopathy noted Psych:  Oriented X3 and memory intact for recent and remote.     Impression & Recommendations:  Problem # 1:  ? of PNEUMONIA (ICD-486)  fever and worsening cough  rti   that is now worsening and with fever  and high risk because of underlying iga deficincy although  sounds like no hx of pneumonia  Will get a chest x ray  and empirically rx and follow up with Dr Tawanna Cooler her PCP.  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    apparently ok   Problem # 3:  ASTHMA, UNSPECIFIED (ICD-493.90)  ?  unclear dx   by hx   The following medications were removed from the medication list:    Singulair 10 Mg Tabs (Montelukast sodium) .Marland Kitchen... Take 1 tablet by mouth once a day  Complete Medication List: 1)  Trazodone Hcl 150 Mg Tabs (Trazodone hcl) .... 1/2 tab at bedtime as needed 2)  Sertraline Hcl 100 Mg Tabs (Sertraline hcl) .Marland Kitchen.. 1 tab @ bedtime 3)  Baby Aspirin 81 Mg Chew (Aspirin) .... Once daily 4)  Multivitamins Tabs (Multiple vitamin) .... Once daily 5)  Calcium 600 1500 Mg Tabs (Calcium carbonate) .... Two times a day 6)  Synthroid 75 Mcg Tabs (Levothyroxine sodium) .... Take 1 tablet by mouth every morning 7)  Acyclovir 200 Mg Caps (Acyclovir) .... Take 3 caps in the am and 2 caps in the pm as needed 8)  Zithromax 250 Mg Tabs (Azithromycin) .... 2 by mouth first day  then 1 by mouth once daily for 4 days  Patient Instructions: 1)  get chest  x ray and will call  2)  follow up with Dr Tawanna Cooler next week . 3)  To follow up if alarm signs  Prescriptions: ZITHROMAX 250 MG TABS (AZITHROMYCIN) 2 by mouth first day  then 1 by mouth once daily for 4 days  #6 x 0   Entered and Authorized by:   Madelin Headings MD   Signed by:   Madelin Headings MD on 10/18/2009   Method used:   Electronically to        [facility]. [phone]* (retail)       [address]       [place], [place]  [address]       Ph: [phone]       Fax: [phone]   RxID:   [id number]

## 2010-09-23 NOTE — Assessment & Plan Note (Signed)
Summary: follow up on labs/mhf   Vital Signs:  Patient Profile:   66 Years Old Female Height:     59.75 inches (151.77 cm) Weight:      118.5 pounds Temp:     98.1 degrees F BP sitting:   114 / 60  Vitals Entered By: Sindy Guadeloupe RN (September 30, 2007 11:51 AM)                 Chief Complaint:  follow up on labs.  History of Present Illness: Alyse is a 65 year old female, who comes in today for evaluation of two problems.  She was seen a couple days ago with diffuse joint aching.  She was started on Motrin 600 mg 3 times a day with food.  She says her symptoms have significantly decreased.  A number of blood studies were done to evaluate the etiology.  All the arthritis tests were negative.  Her laboratory data shows her TSH level to be .03 therefore, we will decrease her Synthroid from 175 micrograms daily to 100 micrograms daily.  Follow-up TSH level  Current Allergies (reviewed today): ! 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 1 once daily  Her updated medication list for this problem includes:    Synthroid 100 Mcg Tabs (Levothyroxine sodium) .Marland Kitchen... Take 1 tablet by mouth every morning   Complete Medication List: 1)  Trazodone  Hcl 150 Mg Tabs (Trazodone hcl) .... 1/2 tab at bedtime as needed 2)  Sertraline Hcl 100 Mg Tabs (Sertraline hcl) .... 1/2 once daily 3)  Singulair 10 Mg Tabs (Montelukast sodium) .... Take 1 tablet by mouth once a day 4)  Baby Aspirin 81 Mg Chew (Aspirin) .... Once daily 5)  Multivitamins Tabs (Multiple vitamin) .... Once daily 6)  Calcium 600 1500 Mg Tabs (Calcium carbonate) .... Two times a day 7)  Vitamin D 1000 Unit Caps (Cholecalciferol) .... One every other week per gyn 8)  Amoxicillin 500 Mg Caps (Amoxicillin) .... Take 1 tablet by mouth three times a day until bottle empty 9)  Synthroid 100 Mcg Tabs (Levothyroxine sodium) .... Take 1 tablet by mouth every morning   Patient Instructions: 1)  continue Motrin, 600 mg 3 times a day with food.  If this medication does not work then let us know.  We have other options. 2)  Decreased urine Synthroid to 100 micrograms daily.  Return in 8 weeks for a nonfasting TSH level.  Code number 244.9    Prescriptions: SYNTHROID 100 MCG  TABS (LEVOTHYROXINE SODIUM) Take 1 tablet by mouth every morning  #100 x 4   Entered and Authorized by:   Roderick Pee MD   Signed by:   Roderick Pee MD  on 09/30/2007   Method used:   Electronically sent to ...       [facility] [phone]*       [address]       [place], [place]  [address]       Ph: [phone]       Fax: [phone]   RxID:   [id number]  ]

## 2010-09-23 NOTE — Letter (Signed)
Summary: EGD Instructions  [facility]  [address] [place], [place] [address]   Phone: [phone]  Fax: [phone]       Jo Gomez    08/06/1946    MRN: [id number]       Procedure Day /Date: Friday 03/07/10     Arrival Time: 3:00     Procedure Time: 4:00     Location of Procedure:                    _X  _ [facility] (4th Floor)    PREPARATION FOR ENDOSCOPY   On 03/07/10 THE DAY OF THE PROCEDURE:  1.   No solid foods, milk or milk products are allowed after midnight the night before your procedure.  2.   Do not drink anything colored red or purple.  Avoid juices with pulp.  No orange juice.  3.  You may drink clear liquids until 2:00 which is 2 hours before your procedure.                                                                                                CLEAR LIQUIDS INCLUDE: Water Jello Ice Popsicles Tea (sugar ok, no milk/cream) Powdered fruit flavored drinks Coffee (sugar ok, no milk/cream) Gatorade Juice: apple, white grape, white cranberry  Lemonade Clear bullion, consomm, broth Carbonated beverages (any kind) Strained chicken noodle soup Hard Candy   MEDICATION INSTRUCTIONS  Unless otherwise instructed, you should take regular prescription medications with a small sip of water as early as possible the morning of your procedure.                      OTHER INSTRUCTIONS  You will need a responsible adult at least 63 years of age to accompany you and drive you home.   This person must remain in the waiting room during your procedure.  Wear loose fitting clothing that is easily removed.  Leave jewelry and other valuables at home.  However, you may wish to bring a book to read or an iPod/MP3 player to listen to music as you wait for your procedure to start.  Remove all body piercing jewelry and leave at home.  Total time from sign-in until discharge is approximately 2-3 hours.  You should  go home directly after your procedure and rest.  You can resume normal activities the day after your procedure.  The day of your procedure you should not:   Drive   Make legal decisions   Operate machinery   Drink alcohol   Return to work  You will receive specific instructions about eating, activities and medications before you leave.    The above instructions have been reviewed and explained to me by   _______________________    I fully understand and can verbalize these instructions _____________________________ Date _________

## 2010-09-23 NOTE — Assessment & Plan Note (Signed)
Summary: ?bladder inf/njr   Vital Signs:  Patient Profile:   65 Years Old Female Height:     59.75 inches (151.77 cm) Temp:     98 degrees F Pulse rate:   70 / minute Resp:     12 per minute  Pt. in pain?   yes    Location:   pelvis    Intensity:   4    Type:       stinging                  Chief Complaint:  dysuria x 3 days and gen. joint aching x 2 mo.  History of Present Illness: Jo Gomez is 65 y/o fem who comes in today for eval of dysuria x 3 days and arthraligias x 2 mo,  She began experiencing burning on urination 3 days ago.  She woke up at 5 a.m. with severe abdominal pain and passing blood clots.  She's had no fever, chills, or back pain.  Her last urine tract infection was 20 years ago.  She continues to have generalized arthralgias.  She has no redness, swelling, or heat.  Her mother has rheumatoid arthritis.  She originally attributed her symptoms to Biaxin, however, she's been off that for a couple months.  The symptoms have persisted.  She is concerned she might have rheumatoid arthritis like her mother  Current Allergies: ! 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 Msk:     No deformity or scoliosis noted of thoracic or lumbar spine.   Pulses:     R and L carotid,radial,femoral,dorsalis pedis and posterior tibial pulses are full and equal bilaterally Extremities:     No clubbing, cyanosis, edema, or deformity noted with normal full range of motion of all joints.   Neurologic:     No cranial nerve deficits noted. Station and gait are normal. Plantar reflexes are down-going bilaterally. DTRs are symmetrical throughout. Sensory, motor and coordinative functions appear intact. Skin:     Intact without suspicious lesions or rashes    Impression & Recommendations:  Problem # 1:  ACUTE CYSTITIS (ICD-595.0) Assessment: New  Her updated medication list for this problem includes:    Levaquin 750 Mg Tabs (Levofloxacin) ..... Once daily    Amoxicillin 500 Mg Caps (Amoxicillin) .Marland Kitchen... 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 1 once daily 2)  Trazodone Hcl 150 Mg Tabs (Trazodone hcl) .... 1/2 tab at bedtime as needed 3)  Sertraline Hcl 100 Mg Tabs (Sertraline hcl) .... 1/2 once daily 4)  Singulair 10 Mg Tabs (Montelukast sodium) .... Take 1 tablet by mouth once a day 5)  Baby Aspirin 81 Mg Chew (Aspirin) .... Once daily 6)  Multivitamins Tabs (Multiple vitamin) .... Once daily 7)  Calcium 600 1500 Mg Tabs (Calcium  carbonate) .... Two times a day 8)  Vitamin D 1000 Unit Caps (Cholecalciferol) .... One every other week per gyn 9)  Levaquin 750 Mg Tabs (Levofloxacin) .... Once daily 10)  Amoxicillin 500 Mg Caps (Amoxicillin) .... Take 1 tablet by mouth three times a day until bottle empty   Patient Instructions: 1)  began amoxicillin, 500 mg 3 times a day until the bottle is empty for the bladder infection. 2)  Start Motrin 600 mg 3 times a day with food.  We will draw laboratory studies today.  Return in one week for follow-up    Prescriptions: AMOXICILLIN 500 MG  CAPS (AMOXICILLIN) Take 1 tablet by mouth three times a day until bottle empty  #30 x 1   Entered and Authorized by:   Roderick Pee MD   Signed by:   Roderick Pee MD on 09/23/2007   Method used:   Electronically sent to ...       [facility] [address] [phone]*       [address]       [place], [place]  [address]       Ph: [phone]       Fax: [phone]   RxID:   [id number]  ]  Appended Document: Orders Update    Clinical Lists Changes  Observations: Added new observation of COMMENTS2: ..................................................................Marland KitchenWynona Canes, CMA  September 23, 2007 2:57 PM  (09/23/2007 14:57)       Laboratory Results   Blood Tests    Date/Time Reported: September 23, 2007 2:57 PM   SED rate: 19  Comments: ..................................................................Marland KitchenWynona Canes, CMA  September 23, 2007 2:57 PM

## 2010-09-23 NOTE — Procedures (Signed)
Summary: Upper Endoscopy  Patient: Jo Gomez Note: All result statuses are Final unless otherwise noted.  Tests: (1) Upper Endoscopy (EGD)   EGD Upper Endoscopy       DONE     [facility]     [address]     [place], [place]  [address]           ENDOSCOPY PROCEDURE REPORT           PATIENT:  [name], Jo Gomez  MR#:  [id number]     BIRTHDATE:  1946/05/04, 64 yrs. old  GENDER:  female           ENDOSCOPIST:  Jo Rea. Jarold Motto, MD, [facility]     Referred by:           PROCEDURE DATE:  03/07/2010     PROCEDURE:  EGD with biopsy     ASA CLASS:  Class II     INDICATIONS:  chest pain ++ CELIAC SEROLOGY.IGA DEFICIENCY.           MEDICATIONS:   Fentanyl 50 mcg IV, Versed 6 mg IV     TOPICAL ANESTHETIC:  Exactacain Spray           DESCRIPTION OF PROCEDURE:   After the risks benefits and     alternatives of the procedure were thoroughly explained, informed     consent was obtained.  The LB GIF-H180 [id number] endoscope was     introduced through the mouth and advanced to the second portion of     the duodenum, limited by Retained food in the stomach.   The     instrument was slowly withdrawn as the mucosa was fully examined.     <<PROCEDUREIMAGES>>           The upper, middle, and distal third of the esophagus were     carefully inspected and no abnormalities were noted. The z-line     was well seen at the GEJ. The endoscope was pushed into the fundus     which was normal including a retroflexed view. The antrum,gastric     body, first and second part of the duodenum were unremarkable.     SMALL BOWEL BIOPSY DONE.++ CELIAC SEROLOGY    Retroflexed views     revealed no abnormalities.    The scope was then withdrawn from     the patient and the procedure completed.           COMPLICATIONS:  None           ENDOSCOPIC IMPRESSION:     1) Normal EGD     ?? GERD.R/O CELIAC DISEASE.     RECOMMENDATIONS:     1) Await biopsy results     2) continue current meds     OP  MANOMETRY AND 24H PH STUDY TO BE SCHEDULED.           REPEAT EXAM:  No           ______________________________     Jo Rea. Jarold Motto, MD, [facility]           CC:  Roderick Pee, MD           n.     Rosalie Doctor:   Jo Rea. Jo Gomez at 03/07/2010 04:18 PM           Dimple Nanas, [id number]  Note: An exclamation mark (!) indicates a result that was not dispersed into the flowsheet. Document Creation Date: 03/07/2010 4:18 PM _______________________________________________________________________  Marland Kitchen  1) Order result status: Final Collection or observation date-time: 03/07/2010 16:10 Requested date-time:  Receipt date-time:  Reported date-time:  Referring Physician:   Ordering Physician: Sheryn Bison [phone]) Specimen Source:  Source: Launa Grill Order Number: [id number] Lab site:   Appended Document: Upper Endoscopy    Clinical Lists Changes  Orders: Added new Test order of Mano/24 hour pH Probe (Mano/24 pH) - Signed

## 2010-09-23 NOTE — Assessment & Plan Note (Signed)
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  She has a sensation of fullness in the left side of her throat.  10 years ago.  She had a  right thyroidectomy because of a mass.  It was benign.  At that time.  She had some cysts in the left thyroid gland and was put on suppression.  She takes Synthroid .75 daily.  She has no trouble swallowing.  No history of reflux, etc..  ENT review of systems negative.  She also complains of low energy.  She states sugar on the couch and later all day.  Her Zoloft dose is 50 daily let's increase it  Allergies: 1)  ! * Sulfa,teracycline,codeine 2)  ! * Clarithromycin  Past History:  Past medical, surgical, family and social histories (including risk factors) reviewed, and no changes noted (except as noted below).  Past Medical History: Reviewed history from 08/13/2008 and no changes required. follicular adenoma R lobe thyroid, Herpes simplex labialis hypothyroidism osteo- arthritis depressionallergic rhinitis sleep dysfunction hysterectomy 15 years ago for nonmalignant reasons.  Ovaries were left intact IGg def  Past Surgical History: Reviewed history from 10/21/2006 and no changes required. DEXA t-slight improvement in osreopenia - 02/23/2002, DEXA-ostoeopenia - 10/21/2000, PFT - 11/22/1997, R thyroid lobectomy cyst - 07/25/1999, s/p hysterectomy and bilat oopherectomy benign - 01/23/1999, TSH 0.762 - 03/14/2004  Family History: Reviewed history from 11/16/2008 and no changes required. Breast CA - mother, colon cancer, IgA deficienccy-father, MI age 21 mother, MA age  25, osteoporosis - mother, RA - mother  pseudogout  Social History: Reviewed history from 10/21/2006 and no changes required. 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 No apparent alopecia or balding. Eyes:  No corneal or conjunctival inflammation noted. EOMI. Perrla. Funduscopic exam benign, without hemorrhages, exudates or papilledema. Vision grossly normal. Ears:  External ear exam shows no significant lesions or deformities.  Otoscopic examination reveals clear canals, tympanic membranes are intact bilaterally without bulging, retraction, inflammation or discharge. Hearing is grossly normal bilaterally. Nose:  External nasal examination shows no deformity or inflammation. Nasal mucosa are pink and moist without lesions or exudates. Mouth:  Oral mucosa and oropharynx without lesions or exudates.  Teeth in good repair. Neck:  the scar in the midline from previous right thyroidectomy.  Left lobe shows some small 8 to 10 mm size cystic lesions.  Ultrasound will be ordered Chest Wall:  No deformities, masses, or tenderness noted. Heart:  Normal rate and regular rhythm. S1 and S2 normal without gallop, murmur, click, rub or other extra sounds.   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 1/2 tab at bedtime as needed    Sertraline Hcl 100  Mg Tabs (Sertraline hcl) .Marland Kitchen... 1 tab @ bedtime  Orders: Prescription Created Electronically [phone]) Venipuncture (84132) TLB-Lipid Panel (80061-LIPID) TLB-BMP (Basic Metabolic Panel-BMET) (80048-METABOL) TLB-CBC Platelet - w/Differential (85025-CBCD)  TLB-Hepatic/Liver Function Pnl (80076-HEPATIC) TLB-TSH (Thyroid Stimulating Hormone) (84443-TSH) TLB-T4 (Thyrox), Free [phone]) TLB-T3, Free (Triiodothyronine) (84481-T3FREE)  Problem # 2:  GOITER, UNSPECIFIED (ICD-240.9) Assessment: New  Orders: Prescription Created Electronically [phone]) Venipuncture (03474) TLB-Lipid Panel (80061-LIPID) TLB-BMP (Basic Metabolic Panel-BMET) (80048-METABOL) TLB-CBC Platelet - w/Differential (85025-CBCD) TLB-Hepatic/Liver Function Pnl (80076-HEPATIC) TLB-TSH (Thyroid Stimulating Hormone) (84443-TSH) TLB-T4 (Thyrox), Free [phone]) TLB-T3, Free (Triiodothyronine) (84481-T3FREE) Radiology Referral (Radiology)  Complete Medication List: 1)  Trazodone Hcl 150 Mg Tabs (Trazodone hcl) .... 1/2 tab at bedtime as needed 2)  Sertraline Hcl 100 Mg Tabs (Sertraline hcl) .Marland Kitchen.. 1 tab @ bedtime 3)  Singulair 10 Mg Tabs (Montelukast sodium) .... Take 1 tablet by mouth once a day 4)  Baby Aspirin 81 Mg Chew (Aspirin) .... Once daily 5)  Multivitamins Tabs (Multiple vitamin) .... Once daily 6)  Calcium 600 1500 Mg Tabs (Calcium carbonate) .... Two times a day 7)  Levothroid 100 Mcg Tabs (Levothyroxine sodium) .... Once daily 8)  Zovirax 200 Mg Caps (Acyclovir) .... 3 in am & 2 in pm 9)  Vitamin D 64332 Unit Caps (Ergocalciferol) .... Take one tab every 2 weeks 10)  Flonase 50 Mcg/act Susp (Fluticasone propionate) .... As needed 11)  Synthroid 100 Mcg Tabs (Levothyroxine sodium) .... Take one tab once daily 12)  Prednisone 20 Mg Tabs (Prednisone) .... Uad 13)  Synthroid 75 Mcg Tabs (Levothyroxine sodium) .... Take 1 tablet by mouth every morning  Patient Instructions: 1)  increase the sertraline to 100 mg nightly.  We will set you up for an ultrasound next week.  We will also get your blood work today and see me the fourth week in August for a 30 minute appointment. Prescriptions: SERTRALINE HCL 100 MG  TABS (SERTRALINE HCL) 1 tab @ bedtime  #100 x 3    Entered and Authorized by:   Roderick Pee MD   Signed by:   Roderick Pee MD on 03/28/2009   Method used:   Electronically to        [facility]. [phone]* (retail)       [address]       [place], [place]  [address]       Ph: [phone]       Fax: [phone]   RxID:   [id number]

## 2010-09-23 NOTE — Progress Notes (Signed)
Summary: Results  Phone Note Outgoing Call Call back at Work Phone [phone]   Call placed by: Ashok Cordia RN,  April 04, 2010 2:07 PM Summary of Call: LM for pt to call to discuss manometry/ ph results.    Initial call taken by: Ashok Cordia RN,  April 04, 2010 2:07 PM  Follow-up for Phone Call        Pt notified of results.  She is currently taking Nexium once daily and carafate 2-4 times daily.  Cont's to have some nausea, chest pain and belching.  Asks if there is another med she can try or increase nexium?  Does not want to consider surgery yet. Follow-up by: Ashok Cordia RN,  April 04, 2010 3:54 PM  Additional Follow-up for Phone Call Additional follow up Details #1::        INCREASE NEXIUM two times a day.... Additional Follow-up by: Mardella Layman MD FACG,  April 04, 2010 4:01 PM    Additional Follow-up for Phone Call Additional follow up Details #2::    Pt notified.  Rx sent.  Pt will report back. Follow-up by: Ashok Cordia RN,  April 04, 2010 4:22 PM  New/Updated Medications: NEXIUM 40 MG  CPDR (ESOMEPRAZOLE MAGNESIUM) 1 caps two times a day. Prescriptions: NEXIUM 40 MG  CPDR (ESOMEPRAZOLE MAGNESIUM) 1 caps two times a day.  #60 x 6   Entered by:   Ashok Cordia RN   Authorized by:   Mardella Layman MD [facility]   Signed by:   Ashok Cordia RN on 04/04/2010   Method used:   Electronically to        [facility]. [phone]* (retail)       [address]       [place], [place]  [address]       Ph: [phone]       Fax: [phone]   RxID:   [id number]

## 2010-09-23 NOTE — Assessment & Plan Note (Signed)
Summary: bronchitis/mhf   Vital Signs:  Patient Profile:   65 Years Old Female Height:     60.25 inches (151.77 cm) Weight:      123 pounds Temp:     98.3 degrees F oral BP sitting:   118 / 70  (left arm) Cuff size:   regular  Vitals Entered By: Kern Reap CMA (August 13, 2008 1:03 PM)                 Chief Complaint:  congestion and cold.  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  She's had no fever, chills, sore throat, or wheezing.  She is a nonsmoker    Prior Medication List:  TRAZODONE HCL 150 MG  TABS (TRAZODONE HCL) 1/2 tab at bedtime as needed SERTRALINE HCL 100 MG  TABS (SERTRALINE HCL) 1/2 once daily SINGULAIR 10 MG  TABS (MONTELUKAST SODIUM) Take 1 tablet by mouth once a day BABY ASPIRIN 81 MG  CHEW (ASPIRIN) once daily MULTIVITAMINS   TABS (MULTIPLE VITAMIN) once daily CALCIUM 600 1500 MG  TABS (CALCIUM CARBONATE) two times a day LEVOTHROID 100 MCG  TABS (LEVOTHYROXINE SODIUM) once daily ZOVIRAX 200 MG  CAPS (ACYCLOVIR) 3 in am & 2 in pm VITAMIN D 04540 UNIT  CAPS (ERGOCALCIFEROL) take one tab every 2 weeks   Updated Prior Medication List: TRAZODONE HCL 150 MG  TABS (TRAZODONE HCL) 1/2 tab at bedtime as needed SERTRALINE HCL 100 MG  TABS (SERTRALINE HCL) 1/2 once daily SINGULAIR 10 MG  TABS (MONTELUKAST SODIUM) Take 1 tablet by mouth once a day BABY ASPIRIN 81 MG  CHEW (ASPIRIN) once daily MULTIVITAMINS   TABS (MULTIPLE VITAMIN) once daily CALCIUM 600 1500 MG  TABS (CALCIUM CARBONATE) two times a day LEVOTHROID 100 MCG  TABS (LEVOTHYROXINE SODIUM) once daily ZOVIRAX 200 MG  CAPS (ACYCLOVIR) 3 in am & 2 in pm VITAMIN D 98119 UNIT  CAPS (ERGOCALCIFEROL) take one tab every 2 weeks FLONASE 50 MCG/ACT SUSP (FLUTICASONE PROPIONATE) as needed  Current Allergies (reviewed today): ! * SULFA,TERACYCLINE,CODEINE ! *  CLARITHROMYCIN  Past Medical History:    Reviewed history from 04/03/2008 and no changes required:       follicular adenoma R lobe thyroid, Herpes simplex labialis       hypothyroidism       osteo- arthritis       depressionallergic rhinitis       sleep dysfunction       hysterectomy 15 years ago for nonmalignant reasons.  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 No apparent alopecia or balding. Eyes:     No corneal or conjunctival inflammation noted. EOMI. Perrla. Funduscopic exam benign, without hemorrhages, exudates or papilledema. Vision grossly normal. Ears:     External ear exam shows no significant lesions or deformities.  Otoscopic examination reveals clear canals, tympanic membranes are intact bilaterally without bulging, retraction, inflammation or discharge. Hearing is grossly normal bilaterally. Nose:  External nasal examination shows no deformity or inflammation. Nasal mucosa are pink and moist without lesions or exudates. Mouth:     Oral mucosa and oropharynx without lesions or exudates.  Teeth in good repair. Neck:     No deformities, masses, or tenderness noted. Chest Wall:     No deformities, masses, or tenderness noted. Lungs:     Normal respiratory effort, chest expands symmetrically. Lungs are clear to auscultation, no crackles or wheezes. Cervical Nodes:     No lymphadenopathy noted    Impression & Recommendations:  Problem # 1:  VIRAL URI (ICD-465.9) Assessment: Deteriorated  Her updated medication list for this problem includes:    Baby Aspirin 81 Mg Chew (Aspirin) .....  Once daily    Hydromet 5-1.5 Mg/66ml Syrp (Hydrocodone-homatropine) .Marland Kitchen... 1 or 2 tsps three times a day as needed   Complete Medication List: 1)  Trazodone Hcl 150 Mg Tabs (Trazodone hcl) .... 1/2 tab at bedtime as needed 2)  Sertraline Hcl 100 Mg Tabs (Sertraline hcl) .... 1/2 once daily 3)  Singulair 10 Mg Tabs (Montelukast sodium) .... Take 1 tablet by mouth once a day 4)  Baby Aspirin 81 Mg Chew (Aspirin) .... Once daily 5)  Multivitamins Tabs (Multiple vitamin) .... Once daily 6)  Calcium 600 1500 Mg Tabs (Calcium carbonate) .... Two times a day 7)  Levothroid 100 Mcg Tabs (Levothyroxine sodium) .... Once daily 8)  Zovirax 200 Mg Caps (Acyclovir) .... 3 in am & 2 in pm 9)  Vitamin D 16109 Unit Caps (Ergocalciferol) .... Take one tab every 2 weeks 10)  Flonase 50 Mcg/act Susp (Fluticasone propionate) .... As needed 11)  Hydromet 5-1.5 Mg/41ml Syrp (Hydrocodone-homatropine) .Marland Kitchen.. 1 or 2 tsps three times a day as needed 12)  Amoxicillin 500 Mg Caps (Amoxicillin) .... Take 1 tablet by mouth three times a day   Patient Instructions: 1)  Get plenty of rest, drink lots of clear liquids, and use Tylenol or Ibuprofen for fever and comfort. Return in 7-10 days if you're not better:sooner if you're feeling worse. 2)  Take 650-1000mg  of Tylenol every 4-6 hours as needed for relief of pain or comfort of fever AVOID taking more than 4000mg   in a 24 hour period (can cause liver damage in higher doses). 3)  you can take one or 2 teaspoons of the Hydromet, up to 3 times a day as needed for cough   Prescriptions: AMOXICILLIN 500 MG CAPS (AMOXICILLIN) Take 1 tablet by mouth three times a day  #30 x 1   Entered and Authorized by:   Roderick Pee MD   Signed by:   Roderick Pee MD on 08/13/2008   Method used:   Print then Give to Patient   RxID:   [id number] HYDROMET 5-1.5 MG/5ML SYRP (HYDROCODONE-HOMATROPINE) 1 or 2 tsps three times a day as needed  #8oz x 1   Entered and Authorized by:    Roderick Pee MD   Signed by:   Roderick Pee MD on 08/13/2008   Method used:   Print then Give to Patient   RxID:   [id number]  ]

## 2010-09-25 NOTE — Letter (Signed)
Summary: [facility]   Imported By: Sherian Rein 08/22/2010 11:24:03  _____________________________________________________________________  External Attachment:    Type:   Image     Comment:   External Document

## 2010-10-09 NOTE — Letter (Signed)
Summary: [facility]   Imported By: Sherian Rein 10/01/2010 09:43:30  _____________________________________________________________________  External Attachment:    Type:   Image     Comment:   External Document

## 2011-01-06 NOTE — Assessment & Plan Note (Signed)
Jo Gomez                                 ON-CALL NOTE   CALLY, NYGARD                     MRN:          [id number]  DATE:04/02/2007                            DOB:          04-24-1946    Jo Gomez is a patient of Dr. Tawanna Cooler who reports that she has had a  sinus infection for the last 2 months.  She has been on several rounds  of antibiotics, with the most recent round being clarithromycin, which  she recently refilled.  She states that she continues to have sinus  pressure, and in the last couple of days had a low-grade fever.  She  denies any nausea or vomiting.   Given the above, I advised the patient that she needs to be seen for  further evaluation this weekend.  I advised her that she needs to be  seen at the emergency department, as most likely she will need a CT scan  of her sinuses given the poor response to 2 months of antibiotic  therapy.  I advised her that there needs to be further assessment to  rule out any other possibility for her symptoms.  Patient expressed  understanding, and will be seek medical attention at [facility].     Leanne Chang, M.D.  Electronically Signed    LA/MedQ  DD: 04/02/2007  DT: 04/02/2007  Job #: [id number]

## 2011-01-06 NOTE — Op Note (Signed)
Gomez, Jo            ACCOUNT NO.:  [id number]   MEDICAL RECORD NO.:  [id number]          PATIENT TYPE:  AMB   LOCATION:  DSC                          FACILITY:  [facility]   PHYSICIAN:  Tennis Must Meyerdierks, M.D.DATE OF BIRTH:  September 25, 1945   DATE OF PROCEDURE:  03/30/2007  DATE OF DISCHARGE:                               OPERATIVE REPORT   PREOPERATIVE DIAGNOSES:  Status post open reduction and internal  fixation right distal radius.   POSTOPERATIVE DIAGNOSES:  Status post open reduction and internal  fixation right distal radius.   PROCEDURE:  Removal of hardware right distal radius.   SURGEON:  Dr. Metro Kung.   ANESTHESIA:  General.   OPERATIVE FINDINGS:  The patient had a DVR titanium plate in place with  four pegs and four screws.  The plate had been in for 6 years and there  was bony overgrowth that encased the entire plate and screws.   DESCRIPTION OF PROCEDURE:  Under general anesthesia with a tourniquet on  the right arm, the right hand was prepped and draped in the usual  fashion and after exsanguinating the limb the tourniquet was inflated to  250 mmHg.  A longitudinal incision was made over the volar radial aspect  of the wrist in line with the old incision and carried down through the  subcutaneous tissues.  Bleeding points were coagulated.  Blunt  dissection was carried down to the FCR tendon sheath which was incised  and the tendon was retracted radially.  The floor of the sheath was  incised and a Therapist, nutritional was used to move the [employer] tendon.  The  pronator quadratus was identified and was incised sharply off of its  radial attachment.  The plate was then identified and a knife was used  to remove the top layer off of the plate and there was noted to be  significant bony overgrowth.  An osteotome was used to remove the bony  overgrowth from the 3.5-mm screws initially.  Three of the screws were  removed, the fourth screw was in the slotted hole  and was stripped.  At  this point the TPS was brought in from the main hospital and care was  taken to protect the muscle from the metal shavings and the 3.5-mm screw  head was removed with the TPS.  The pegs ulnarly were then removed, two  of the pegs ulnarly and then the two radial pegs were stripped.  The TPS  was then used to remove the two heads of the radial pegs.  An osteotome  was then used to elevate the plate from the bone and the plate was  removed.  Two pegs and one screw were left headless in the bone.  The  wound was copiously irrigated.  Metal shavings were debrided.  However,  there were still some left in the muscle.  The wound was irrigated and  closed with 4-0 Vicryl and subcutaneous tissues and 3-0 subcuticular  Prolene in the skin.  Steri-Strips were applied.  0.5% Marcaine was  placed in the skin edges for pain control.  A Vesseloop drain was left  in for drainage.  The patient was placed in a volar wrist splint.  A  tourniquet was released with good circulation of the hand.  The patient  went to the recovery room awake in stable and good condition.     Lowell Bouton, M.D.  Electronically Signed    EMM/MEDQ  D:  03/30/2007  T:  03/30/2007  Job:  [id number]

## 2011-01-09 NOTE — Op Note (Signed)
[facility]  Patient:    Jo Gomez, Jo Gomez Visit Number: [id number] MRN: [id number]          Service Type: DSU Location: [facility] Attending Physician:  Kendell Bane Dictated by:   Lowell Bouton, M.D. Adm. Date:  04/20/2001   CC:         Harvie Junior, M.D.   Operative Report  PREOPERATIVE DIAGNOSIS:  Malunion, right distal radius.  POSTOPERATIVE DIAGNOSIS:  Malunion, right distal radius.  PROCEDURE:  Takedown of malunion with realignment of distal radius using cadaver iliac crest bone graft and volar plate fixation with a DVR plate.  SURGEON:  Lowell Bouton, M.D.  ANESTHESIA:  Axillary block.  OPERATIVE FINDINGS:  The patient had a malunited distal radius with a 30 degree dorsal tilt and shortening, causing 2+ ulnar-positive variance.  The fracture was 2-1/2 months old.  There was callus formation, and the fracture was nearly healed.  DESCRIPTION OF PROCEDURE:  Under axillary block anesthesia with a tourniquet on the right arm, the right hand was prepped and draped in the usual fashion and after exsanguinating the limb, the tourniquet was inflated to 250 mmHg.  A longitudinal incision was made over the dorsum of the wrist overlying the third dorsal compartment.  Sharp dissection was carried through the subcutaneous tissues, and bleeding points were coagulated.  Blunt dissection was carried down to the EPL tendon, and the EPL sheath was opened longitudinally.  Subperiosteal dissection was done on the distal radius, elevating the third and fourth dorsal compartment radially and ulnarly.  The fracture site was identified, and a rongeur was used to remove callus.  A Freer elevator was used also along with a rongeur to open up the fracture site.  With difficulty this was opened from dorsal to volar, leaving the volar cortex intact.  A Freer elevator was then used to elevate the distal fragment, opening the  osteotomy site.  A lamina spreader was then inserted, and x-rays were obtained showing improvement in the dorsal tilt and radial length.  A freeze-dried iliac crest cadaver bone was then used, and it was shaped with a saw into a wedge.  The bone was then inserted dorsally and impacted with a tamp.  X-rays showed good position of the bone.  At this point the osteotomy and graft appeared to be stable, and so a volar approach was made to the right distal radius.  Sharp dissection was carried through the subcutaneous tissues, and bleeding points were coagulated.  Sharp dissection was carried through the flexor carpi radialis sheath, and it was retracted ulnarly.  The floor of the sheath was incised longitudinally, and the flexor tendons were retracted ulnarly.  The pronator quadratus was lifted sharply off of its radial attachment and elevated ulnarly.  This allowed good exposure to the volar radius and the fracture site.  The volar cortex had a fracture line, but it was aligned well.  A DVR plate was then applie to the volar aspect of the radius, and a 3.5 screw was placed in the sliding hole.  X-rays showed good position of the plate, and so the 2.0 mm pegs were inserted distally.  One threaded peg was inserted as it went through a portion of the dorsal graft. The other pegs were smooth.  The remaining screws were placed proximally. X-rays showed good position of the graft and good alignment and length of the distal radius.  Under fluoroscopy, the joint was examined and there was no evidence  of impingement of the pegs.  The forearm was rotated and allowed full rotation.  The wounds were then irrigated copiously with saline.  The dorsal wound had further iliac crest bone graft packed around the osteotomy site.  A vessel loop drain was left in for drainage, and subcutaneous tissues were closed with 4-0 Vicryl and the skin with a 3-0 subcuticular Prolene. Steri-Strips were applied dorsally.  The  volar wound was then irrigated.  The pronator quadratus was repaired with a 4-0 Vicryl suture, and a vessel loop drain was left in for drainage.  The subcutaneous tissue was closed with 4-0 Vicryl and the skin with a 3-0 subcuticular Prolene.  Steri-Strips were applied, followed by a sterile dressing.  The patient was placed in a sugar tong splint.  The tourniquet was released with good circulation of the hand, and she went to the recovery room awake and stable, in good condition. Dictated by:   Lowell Bouton, M.D. Attending Physician:  Kendell Bane DD:  04/20/01 TD:  04/21/01 Job: [id number]

## 2011-01-09 NOTE — Op Note (Signed)
Jo Gomez, Jo Gomez            ACCOUNT NO.:  [id number]   MEDICAL RECORD NO.:  [id number]          PATIENT TYPE:  AMB   LOCATION:  ENDO                         FACILITY:  [facility]   PHYSICIAN:  James L. Malon Kindle., M.D.DATE OF BIRTH:  1946/04/27   DATE OF PROCEDURE:  09/12/2004  DATE OF DISCHARGE:                                 OPERATIVE REPORT   PROCEDURE:  Colonoscopy.   MEDICATIONS:  Fentanyl 100 mcg, Versed 2 mg IV.   SCOPE:  Olympus pediatric adjustable colonoscope.   INDICATIONS:  Patient with intermittent rectal bleeding with a strong family  history of colon cancer.  Had a colonoscopy 3-4 years ago due to rectal  bleeding.  This is done somewhat sooner.   DESCRIPTION OF PROCEDURE:  The procedure had been explained to the patient  and consent obtained.  With the patient in the left lateral decubitus  position, the Olympus pediatric adjustable scope was inserted and advanced.  The prep was excellent.  She had a very long, tortuous colon.  Multiple  maneuvers, including abdominal pressure and positional changes were  required.  We were able to reach the cecum.  The ileocecal valve seen.  The  scope withdrawn.  The cecum, ascending colon, transverse colon, splenic  flexure, descending and sigmoid colon were seen well.  No polyps or other  lesions were seen.  The scope was withdrawn.  The patient tolerated the  procedure well.   ASSESSMENT:  1.  Strong family history of colon cancer.  V16.0.  2.  Rectal bleeding.  578.1.  Probably due to internal hemorrhoids.   PLAN:  We will recommend yearly Hemoccults and repeat procedure in five  years.      JLE/MEDQ  D:  09/12/2004  T:  09/12/2004  Job:  [id number]   cc:   Deniece Portela A. Sheffield Slider, M.D.  Fax: [phone]

## 2011-01-09 NOTE — Procedures (Signed)
[facility]  Patient:    Jo Gomez, Jo Gomez Visit Number: [id number] MRN: [id number]          Service Type: END Location: ENDO Attending Physician:  Orland Mustard Dictated by:   Llana Aliment. Randa Evens, M.D. Proc. Date: 10/31/01 Admit Date:  10/31/2001   CC:         Wayne A. Sheffield Slider, M.D.  Beather Arbour Thomasena Edis, M.D.   Procedure Report  DATE OF BIRTH:  [birth year]  PROCEDURE:  Colonoscopy.  MEDICATIONS:  Fentanyl 125 mcg, Versed 10 mg IV.  INDICATIONS FOR PROCEDURE:  The patient had a colonoscopy a few years ago, developed heme positive stool and some rectal bleeding. She does have a strong family history of colon cancer.  DESCRIPTION OF PROCEDURE:  The procedure had been explained to the patient and consent obtained. With the patient in the left lateral decubitus position, a digital exam was performed. The Olympus pediatric video colonoscope was inserted and advanced under direct visualization. The prep was excellent, the patient had an extraordinarily long tortuous colon. Multiple maneuvers were required and abdominal pressure and position changes and eventually we were able to reach the cecum. The ileocecal valve and appendiceal orifice were seen. The scope was withdrawn and the cecum, ascending colon, hepatic flexure, transverse colon, splenic flexure, descending and sigmoid colon were seen well upon withdrawal. No polyps were seen throughout and no significant diverticular disease. The scope was withdrawn down in the rectum, the rectum was free of polyps, internal hemorrhoids were seen in the anal canal. The scope withdrawn. The patient tolerated the procedure well and was maintained on low flow oxygen and pulse oximeter throughout the procedure.  ASSESSMENT:  1. Heme positive stools and rectal bleeding possibly due to the internal     hemorrhoids.  2. Strong family history of colonic neoplasia.  PLAN:  Will give the hemorrhoid sheet. Will  recommend repeating procedure in five years and will her back in the office in two months and check her stool for blood. Dictated by:   Llana Aliment. Randa Evens, M.D. Attending Physician:  Orland Mustard DD:  10/31/01 TD:  11/01/01 Job: [id number]

## 2011-05-09 ENCOUNTER — Other Ambulatory Visit: Payer: Self-pay | Admitting: Family Medicine

## 2011-05-19 ENCOUNTER — Other Ambulatory Visit: Payer: Self-pay | Admitting: Family Medicine

## 2011-06-08 LAB — POCT HEMOGLOBIN-HEMACUE
Hemoglobin: 15.1 — ABNORMAL HIGH
Operator id: 208731

## 2011-06-18 ENCOUNTER — Other Ambulatory Visit: Payer: Self-pay | Admitting: Family Medicine

## 2011-07-01 ENCOUNTER — Other Ambulatory Visit: Payer: Self-pay | Admitting: Family Medicine

## 2011-07-04 ENCOUNTER — Other Ambulatory Visit: Payer: Self-pay | Admitting: Family Medicine

## 2011-07-06 ENCOUNTER — Other Ambulatory Visit (INDEPENDENT_AMBULATORY_CARE_PROVIDER_SITE_OTHER): Payer: BC Managed Care – PPO

## 2011-07-06 DIAGNOSIS — Z0000 Encounter for general adult medical examination without abnormal findings: Secondary | ICD-10-CM

## 2011-07-06 LAB — POCT URINALYSIS DIPSTICK
Bilirubin, UA: NEGATIVE
Blood, UA: NEGATIVE
Glucose, UA: NEGATIVE
Ketones, UA: NEGATIVE
Nitrite, UA: NEGATIVE
Protein, UA: NEGATIVE
Spec Grav, UA: 1.015
Urobilinogen, UA: 0.2
pH, UA: 7

## 2011-07-07 LAB — CBC WITH DIFFERENTIAL/PLATELET
Basophils Absolute: 0 10*3/uL (ref 0.0–0.1)
Basophils Relative: 0.3 % (ref 0.0–3.0)
Eosinophils Absolute: 0.8 10*3/uL — ABNORMAL HIGH (ref 0.0–0.7)
Eosinophils Relative: 13.2 % — ABNORMAL HIGH (ref 0.0–5.0)
HCT: 39.9 % (ref 36.0–46.0)
Hemoglobin: 13.4 g/dL (ref 12.0–15.0)
Lymphocytes Relative: 17 % (ref 12.0–46.0)
Lymphs Abs: 1 10*3/uL (ref 0.7–4.0)
MCHC: 33.5 g/dL (ref 30.0–36.0)
MCV: 97.1 fl (ref 78.0–100.0)
Monocytes Absolute: 0.6 10*3/uL (ref 0.1–1.0)
Monocytes Relative: 10.2 % (ref 3.0–12.0)
Neutro Abs: 3.6 10*3/uL (ref 1.4–7.7)
Neutrophils Relative %: 59.3 % (ref 43.0–77.0)
Platelets: 305 10*3/uL (ref 150.0–400.0)
RBC: 4.11 Mil/uL (ref 3.87–5.11)
RDW: 14.7 % — ABNORMAL HIGH (ref 11.5–14.6)
WBC: 6.1 10*3/uL (ref 4.5–10.5)

## 2011-07-07 LAB — BASIC METABOLIC PANEL
BUN: 12 mg/dL (ref 6–23)
CO2: 28 mEq/L (ref 19–32)
Calcium: 8.7 mg/dL (ref 8.4–10.5)
Chloride: 103 mEq/L (ref 96–112)
Creatinine, Ser: 0.8 mg/dL (ref 0.4–1.2)
GFR: 78.03 mL/min (ref >60.00)
Glucose, Bld: 63 mg/dL — ABNORMAL LOW (ref 70–99)
Potassium: 4.6 mEq/L (ref 3.5–5.1)
Sodium: 139 mEq/L (ref 135–145)

## 2011-07-07 LAB — LIPID PANEL
Cholesterol: 198 mg/dL (ref 0–200)
HDL: 72.1 mg/dL (ref >39.00)
LDL Cholesterol: 104 mg/dL — ABNORMAL HIGH (ref 0–99)
Total CHOL/HDL Ratio: 3
Triglycerides: 108 mg/dL (ref 0.0–149.0)
VLDL: 21.6 mg/dL (ref 0.0–40.0)

## 2011-07-07 LAB — HEPATIC FUNCTION PANEL
ALT: 18 U/L (ref 0–35)
AST: 24 U/L (ref 0–37)
Albumin: 3.9 g/dL (ref 3.5–5.2)
Alkaline Phosphatase: 63 U/L (ref 39–117)
Bilirubin, Direct: 0.1 mg/dL (ref 0.0–0.3)
Total Bilirubin: 0.6 mg/dL (ref 0.3–1.2)
Total Protein: 6.7 g/dL (ref 6.0–8.3)

## 2011-07-07 LAB — TSH: TSH: 1.33 u[IU]/mL (ref 0.35–5.50)

## 2011-07-13 ENCOUNTER — Encounter: Payer: Self-pay | Admitting: Family Medicine

## 2011-07-13 ENCOUNTER — Ambulatory Visit (INDEPENDENT_AMBULATORY_CARE_PROVIDER_SITE_OTHER): Payer: BC Managed Care – PPO | Admitting: Family Medicine

## 2011-07-13 DIAGNOSIS — M19079 Primary osteoarthritis, unspecified ankle and foot: Secondary | ICD-10-CM

## 2011-07-13 DIAGNOSIS — M129 Arthropathy, unspecified: Secondary | ICD-10-CM

## 2011-07-13 DIAGNOSIS — E039 Hypothyroidism, unspecified: Secondary | ICD-10-CM

## 2011-07-13 DIAGNOSIS — B009 Herpesviral infection, unspecified: Secondary | ICD-10-CM

## 2011-07-13 DIAGNOSIS — F329 Major depressive disorder, single episode, unspecified: Secondary | ICD-10-CM

## 2011-07-13 DIAGNOSIS — K449 Diaphragmatic hernia without obstruction or gangrene: Secondary | ICD-10-CM

## 2011-07-13 DIAGNOSIS — E538 Deficiency of other specified B group vitamins: Secondary | ICD-10-CM

## 2011-07-13 DIAGNOSIS — Z23 Encounter for immunization: Secondary | ICD-10-CM

## 2011-07-13 DIAGNOSIS — J309 Allergic rhinitis, unspecified: Secondary | ICD-10-CM

## 2011-07-13 MED ORDER — SERTRALINE HCL 100 MG PO TABS: 100.0000 mg | ORAL_TABLET | Freq: Every day | ORAL | Status: DC

## 2011-07-13 MED ORDER — LEVOTHYROXINE SODIUM 75 MCG PO TABS: 75.0000 ug | ORAL_TABLET | Freq: Every day | ORAL | Status: DC

## 2011-07-13 MED ORDER — ACYCLOVIR 200 MG PO CAPS: 200.0000 mg | ORAL_CAPSULE | ORAL | Status: DC

## 2011-07-13 MED ORDER — CYANOCOBALAMIN 1000 MCG/ML IJ SOLN: 1000.0000 ug | INTRAMUSCULAR | Status: DC

## 2011-07-13 MED ORDER — TRAZODONE HCL 150 MG PO TABS: 150.0000 mg | ORAL_TABLET | Freq: Every day | ORAL | Status: DC

## 2011-07-13 NOTE — Progress Notes (Signed)
  Subjective:    Patient ID: Jo Gomez, female    DOB: 20-Apr-1946, 65 y.o.   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  Medications reviewed.  There been no changes except she is continuing to take Estratest one half tab 3 times weekly from GYN.  She's been on this for 15 years.  Recommend she consider coming off the hormone replacement therapy.  She has trouble with her hips.  She has some soreness.  It seems to get worse in the morning, when it is cold.  She does her exercises on a regular basis and keep her weight down.  Current weight 109 pounds.  Tetanus booster 2004, Pneumovax, x 2, seasonal flu shot today.  Review of Systems  Constitutional: Negative.   HENT: Negative.   Eyes: Negative.   Respiratory: Negative.   Cardiovascular: Negative.   Gastrointestinal: Negative.   Genitourinary: Negative.   Musculoskeletal: Positive for back pain and arthralgias.  Neurological: Negative.   Hematological: Negative.   Psychiatric/Behavioral: Negative.        Objective:   Physical Exam  Constitutional: She appears well-developed and well-nourished.  HENT:  Head: Normocephalic and atraumatic.  Right Ear: External ear normal.  Left Ear: External ear normal.  Nose: Nose normal.  Mouth/Throat: Oropharynx is clear and moist.  Eyes: EOM are normal. Pupils are equal, round, and reactive to light.  Neck: Normal range of motion. Neck supple. No thyromegaly present.  Cardiovascular: Normal rate, regular rhythm, normal heart sounds and intact distal pulses.  Exam reveals no gallop and no friction rub.   No murmur heard. Pulmonary/Chest: Effort normal and breath sounds normal.  Abdominal: Soft. Bowel sounds are normal. She exhibits no distension and  no mass. There is no tenderness. There is no rebound.  Genitourinary:       Bilateral breast exam normal  Musculoskeletal: Normal range of motion.  Lymphadenopathy:    She has no cervical adenopathy.  Neurological: She is alert. She has normal reflexes. No cranial nerve deficit. She exhibits normal muscle tone. Coordination normal.  Skin: Skin is warm and dry.  Psychiatric: She has a normal mood and affect. Her behavior is normal. Judgment and thought content normal.          Assessment & Plan:  Healthy female.  Recurrent HSV, continue acyclovir p.r.n.  B12 deficiency continue vitamin B12 shots 1 cc monthly check labs.  Hypothyroidism.  Continue Synthroid 75 mcg daily.  History of depression.  Continue Zoloft 100 mg nightly and trazodone 75 mg nightly  Osteoarthritis.  Motrin, 600 b.i.d., continue exercise.  On HRT x 15 years recommend she stop discuss with GYN

## 2011-07-13 NOTE — Patient Instructions (Signed)
Continue your current medications.  You can try 400 mg of Motrin twice daily with food for the joint pain.  Continue your exercise program.  Return in one year, sooner if any problems

## 2011-07-29 ENCOUNTER — Other Ambulatory Visit: Payer: Self-pay | Admitting: Obstetrics & Gynecology

## 2011-07-29 DIAGNOSIS — Z1231 Encounter for screening mammogram for malignant neoplasm of breast: Secondary | ICD-10-CM

## 2011-08-26 ENCOUNTER — Ambulatory Visit
Admission: RE | Admit: 2011-08-26 | Discharge: 2011-08-26 | Disposition: A | Discharge status: Home / Self Care | Payer: BC Managed Care – PPO | Source: Ambulatory Visit | Attending: Obstetrics & Gynecology | Admitting: Obstetrics & Gynecology

## 2011-08-26 DIAGNOSIS — Z1231 Encounter for screening mammogram for malignant neoplasm of breast: Secondary | ICD-10-CM

## 2011-10-28 ENCOUNTER — Other Ambulatory Visit: Payer: Self-pay | Admitting: Family Medicine

## 2011-12-04 ENCOUNTER — Ambulatory Visit: Payer: BC Managed Care – PPO

## 2011-12-04 ENCOUNTER — Other Ambulatory Visit: Payer: Self-pay | Admitting: Family Medicine

## 2011-12-04 ENCOUNTER — Ambulatory Visit (INDEPENDENT_AMBULATORY_CARE_PROVIDER_SITE_OTHER): Payer: BC Managed Care – PPO | Admitting: Family Medicine

## 2011-12-04 VITALS — BP 113/72 | HR 66 | Temp 98.0°F | Resp 16 | Ht 60.25 in | Wt 108.2 lb

## 2011-12-04 DIAGNOSIS — M545 Low back pain, unspecified: Secondary | ICD-10-CM

## 2011-12-04 DIAGNOSIS — M25551 Pain in right hip: Secondary | ICD-10-CM

## 2011-12-04 DIAGNOSIS — M79604 Pain in right leg: Secondary | ICD-10-CM

## 2011-12-04 DIAGNOSIS — M25559 Pain in unspecified hip: Secondary | ICD-10-CM

## 2011-12-04 DIAGNOSIS — M810 Age-related osteoporosis without current pathological fracture: Secondary | ICD-10-CM

## 2011-12-04 MED ORDER — TRAMADOL HCL 50 MG PO TABS: 50.0000 mg | ORAL_TABLET | Freq: Two times a day (BID) | ORAL | Status: AC | PRN

## 2011-12-04 MED ORDER — DICLOFENAC SODIUM 75 MG PO TBEC: 75.0000 mg | DELAYED_RELEASE_TABLET | Freq: Two times a day (BID) | ORAL | Status: DC

## 2011-12-04 NOTE — Patient Instructions (Signed)
Use Ultram only as needed OK to use Glucosamine for joint symptoms  Our office will be in touch regarding your referrals to PT and orthopedics.  Please schedule a follow up appointment with Dr. Tawanna Cooler regarding your fatigue

## 2011-12-04 NOTE — Progress Notes (Signed)
  Subjective:    Patient ID: Jo Gomez, female    DOB: November 21, 1945, 66 y.o.   MRN: [id number]  HPI  Patient complains of (R) hip pain that radiates to the groin and knee.  Does also mention she has symptoms on the left though less painful.   Traumatic fall 8 days ago with significant increase in symptoms   ROS/ Fatigue and malaise X 1 month; has had symptoms in the past; primary MD is Dr. Tawanna Cooler; though she has not seen him recently  SH/ retired [employer]film[employer]  Review of Systems     Objective:   Physical Exam  Constitutional: She appears well-developed.  Neck: Neck supple.  Cardiovascular: Normal rate, regular rhythm and normal heart sounds.   Pulmonary/Chest: Effort normal and breath sounds normal.  Musculoskeletal:       Right hip: She exhibits tenderness (discomfort with internal and external ROM).  Neurological: She is alert. No sensory deficit.  Reflex Scores:      Patellar reflexes are 2+ on the right side and 2+ on the left side.      Achilles reflexes are 1+ on the right side and 1+ on the left side.      Strength (R) hip flexor 4/5     [facility] reading (PRIMARY) by  Dr. Hal Hope  Degenerative changes (R) hip Degenerative change L4-L5      Assessment & Plan:   1. Hip pain, right  DG Lumbar Spine Complete, diclofenac (VOLTAREN) 75 MG EC tablet, traMADol (ULTRAM) 50 MG tablet, Ambulatory referral to Physical Therapy, Ambulatory referral to Orthopedic Surgery  2. LBP   DG Hip Complete Right, diclofenac (VOLTAREN) 75 MG EC tablet, traMADol (ULTRAM) 50 MG tablet, Ambulatory referral to Physical Therapy, Ambulatory referral to Orthopedic Surgery  3. Osteoporosis

## 2011-12-07 ENCOUNTER — Ambulatory Visit: Payer: BC Managed Care – PPO | Admitting: Family Medicine

## 2011-12-07 ENCOUNTER — Ambulatory Visit (INDEPENDENT_AMBULATORY_CARE_PROVIDER_SITE_OTHER): Payer: BC Managed Care – PPO | Admitting: Internal Medicine

## 2011-12-07 VITALS — BP 107/62 | HR 66 | Temp 98.2°F | Resp 16 | Ht 60.0 in | Wt 109.0 lb

## 2011-12-07 DIAGNOSIS — B029 Zoster without complications: Secondary | ICD-10-CM

## 2011-12-07 MED ORDER — HYDROCODONE-ACETAMINOPHEN 5-500 MG PO TABS: 1.0000 | ORAL_TABLET | Freq: Four times a day (QID) | ORAL | Status: DC | PRN

## 2011-12-07 MED ORDER — VALACYCLOVIR HCL 1 G PO TABS: 1000.0000 mg | ORAL_TABLET | Freq: Three times a day (TID) | ORAL | Status: DC

## 2011-12-07 NOTE — Progress Notes (Signed)
  Subjective:    Patient ID: Jo Gomez, female    DOB: 1946/03/09, 67 y.o.   MRN: [id number]  HPIsee Recent visit for Right hip pain 2 days ago, the day after her visit, she developed a tender rash around the anterior right hip. Over the past 48 hours this has spread to the right labial area and down the right anterior thigh to the knee. There is no itching but marked sensitivity. No fever or night sweats. She describes this hip pain is growing in intensity over 2-3 weeks during which time she also felt very fatigued and listless. She has not lost weight or seemed to be ill in any other way. She is on no chronic medications and has no ongoing diagnosis Other than hypothyroidism treated for many years with the same dose of medication. Her last normal labs were in the fall of 2012 with her primary care physician Dr. Tawanna Cooler.    Review of SystemsHistory of hereditary IgA deficiency Her problem list reviews other issues but all are stable Patient Active Problem List  Diagnoses  . Unspecified Hypothyroidism  . VITAMIN B12 DEFICIENCY  . SELECTIVE IGA IMMUNODEFICIENCY  . Depressive Disorder, not Elsewhere Classified  . SINUSITIS, CHRONIC, NOS  . ALLERGIC RHINITIS  . ESOPHAGEAL REFLUX  . HIATAL HERNIA WITH REFLUX  . UNSPECIFIED ARTHROPATHY ANKLE AND FOOT  . ARTHRITIS, GENERALIZED  . OSTEOPENIA       Objective:   Physical ExamVital signs are stable The right hip area has multiple erythematous tender papules in clusters that extend into the groin and labia area and extending down to the anterior thigh toward the knee The skin is hypersensitive even in uninvolved areas There are no vesicles or scabs or pustules There is no regional adenopathy        Assessment & Plan:  Problem #1 herpes zoster  Valtrex 1 g 3 times a day #30 Vicodin 5 500 4 times a day as needed #30 If not totally resolved in 10 days followup to begin Neurontin Followup here or with Dr. Tawanna Cooler if any complications

## 2011-12-08 ENCOUNTER — Encounter: Payer: Self-pay | Admitting: Family Medicine

## 2011-12-14 ENCOUNTER — Telehealth: Payer: Self-pay

## 2011-12-14 NOTE — Telephone Encounter (Signed)
PT REQUESTING COPY OF X-RAY REPORT TO PICK UP FOR HER FAMILY DOCTOR,  PLEASE CALL [phone] WHEN READY.

## 2011-12-14 NOTE — Telephone Encounter (Signed)
X-ray report ready for pick up. Patient notified.

## 2011-12-16 ENCOUNTER — Ambulatory Visit (INDEPENDENT_AMBULATORY_CARE_PROVIDER_SITE_OTHER): Payer: BC Managed Care – PPO | Admitting: Family Medicine

## 2011-12-16 ENCOUNTER — Encounter: Payer: Self-pay | Admitting: Family Medicine

## 2011-12-16 VITALS — BP 114/70 | HR 76 | Temp 98.0°F | Wt 110.0 lb

## 2011-12-16 DIAGNOSIS — B029 Zoster without complications: Secondary | ICD-10-CM

## 2011-12-16 MED ORDER — GABAPENTIN 300 MG PO CAPS: 300.0000 mg | ORAL_CAPSULE | Freq: Two times a day (BID) | ORAL | Status: DC

## 2011-12-16 MED ORDER — HYDROCODONE-ACETAMINOPHEN 10-325 MG PO TABS: 1.0000 | ORAL_TABLET | Freq: Four times a day (QID) | ORAL | Status: AC | PRN

## 2011-12-16 NOTE — Progress Notes (Signed)
  Subjective:    Patient ID: Jo Gomez, female    DOB: [birth year], 66 y.o.   MRN: [id number]  HPI Here for follow up on shingles which involves the right anterior thigh and groin region. She saw Urgent Care for this and was started on Valtrex. Given some Vicodin for pain. The rash is already clearing up but she is having a lot of pain in the area.    Review of Systems  Constitutional: Negative.   Skin: Positive for rash.       Objective:   Physical Exam  Constitutional: She appears well-developed and well-nourished.  Skin:       Macular red scabbed areas as above           Assessment & Plan:  Increase the Vicodin to 10/325 to use prn. Add Gabapentin. Recheck prn

## 2011-12-30 ENCOUNTER — Encounter: Payer: Self-pay | Admitting: Family Medicine

## 2011-12-30 ENCOUNTER — Telehealth: Payer: Self-pay | Admitting: Family Medicine

## 2011-12-30 ENCOUNTER — Ambulatory Visit (INDEPENDENT_AMBULATORY_CARE_PROVIDER_SITE_OTHER): Payer: BC Managed Care – PPO | Admitting: Family Medicine

## 2011-12-30 VITALS — BP 120/70 | Temp 98.0°F | Wt 114.0 lb

## 2011-12-30 DIAGNOSIS — J309 Allergic rhinitis, unspecified: Secondary | ICD-10-CM

## 2011-12-30 MED ORDER — AMOXICILLIN 500 MG PO CAPS: 500.0000 mg | ORAL_CAPSULE | Freq: Three times a day (TID) | ORAL | Status: AC

## 2011-12-30 MED ORDER — PREDNISONE 20 MG PO TABS: ORAL_TABLET | ORAL | Status: DC

## 2011-12-30 NOTE — Telephone Encounter (Signed)
Appt made/ pt aware  

## 2011-12-30 NOTE — Progress Notes (Signed)
  Subjective:    Patient ID: Jo Gomez, female    DOB: [birth year], 66 y.o.   MRN: [id number]  HPI [name] is a 66 year old female nonsmoker with a history of allergic rhinitis usually related to mold who comes in today with a week's history of head congestion postnasal drip and sinus congestion. She has no fever chills earache sore throat mild cough no wheezing.   Review of Systems    general and ENT review of systems otherwise negative no history of sinus surgery Objective:   Physical Exam  Well-developed well-nourished female in no acute distress HEENT negative specifically the septum was in the midline and 3+ nasal edema neck was supple no adenopathy lungs are clear      Assessment & Plan:  Allergic rhinitis plan prednisone burst and taper return when necessary

## 2011-12-30 NOTE — Telephone Encounter (Signed)
Ok to work in.

## 2011-12-30 NOTE — Telephone Encounter (Signed)
Pt called and has sinus inf. Req  appt with Dr Tawanna Cooler today if possible or pt said that she can see anyone.

## 2011-12-30 NOTE — Patient Instructions (Signed)
Take the prednisone as directed  Premedicate you nose at bedtime with afrin nasal spray x5 nights prior to using the saline irrigation  If you don't see any improvement by Monday then I would add an antibiotic,,,,,,,, amoxicillin 500 mg 3 times daily for 10 days

## 2012-06-09 ENCOUNTER — Other Ambulatory Visit: Payer: Self-pay | Admitting: *Deleted

## 2012-06-09 DIAGNOSIS — F329 Major depressive disorder, single episode, unspecified: Secondary | ICD-10-CM

## 2012-06-09 MED ORDER — SERTRALINE HCL 100 MG PO TABS: 100.0000 mg | ORAL_TABLET | Freq: Every day | ORAL | Status: DC

## 2012-06-09 MED ORDER — TRAZODONE HCL 150 MG PO TABS: 150.0000 mg | ORAL_TABLET | Freq: Every day | ORAL | Status: DC

## 2012-07-13 DIAGNOSIS — H57819 Brow ptosis, unspecified: Secondary | ICD-10-CM | POA: Insufficient documentation

## 2012-07-14 ENCOUNTER — Other Ambulatory Visit: Payer: BC Managed Care – PPO

## 2012-07-15 ENCOUNTER — Other Ambulatory Visit (INDEPENDENT_AMBULATORY_CARE_PROVIDER_SITE_OTHER): Payer: BC Managed Care – PPO

## 2012-07-15 DIAGNOSIS — Z0000 Encounter for general adult medical examination without abnormal findings: Secondary | ICD-10-CM

## 2012-07-15 LAB — POCT URINALYSIS DIPSTICK
Bilirubin, UA: NEGATIVE
Glucose, UA: NEGATIVE
Ketones, UA: NEGATIVE
Nitrite, UA: NEGATIVE
Protein, UA: NEGATIVE
Spec Grav, UA: 1.015
Urobilinogen, UA: 0.2
pH, UA: 8.5

## 2012-07-15 LAB — BASIC METABOLIC PANEL
BUN: 13 mg/dL (ref 6–23)
CO2: 28 mEq/L (ref 19–32)
Calcium: 8.8 mg/dL (ref 8.4–10.5)
Chloride: 101 mEq/L (ref 96–112)
Creatinine, Ser: 0.8 mg/dL (ref 0.4–1.2)
GFR: 74.51 mL/min (ref >60.00)
Glucose, Bld: 107 mg/dL — ABNORMAL HIGH (ref 70–99)
Potassium: 4 mEq/L (ref 3.5–5.1)
Sodium: 137 mEq/L (ref 135–145)

## 2012-07-15 LAB — CBC WITH DIFFERENTIAL/PLATELET
Basophils Absolute: 0 10*3/uL (ref 0.0–0.1)
Basophils Relative: 0.5 % (ref 0.0–3.0)
Eosinophils Absolute: 0.5 10*3/uL (ref 0.0–0.7)
Eosinophils Relative: 8.3 % — ABNORMAL HIGH (ref 0.0–5.0)
HCT: 40.6 % (ref 36.0–46.0)
Hemoglobin: 13.5 g/dL (ref 12.0–15.0)
Lymphocytes Relative: 14.7 % (ref 12.0–46.0)
Lymphs Abs: 1 10*3/uL (ref 0.7–4.0)
MCHC: 33.1 g/dL (ref 30.0–36.0)
MCV: 96.5 fl (ref 78.0–100.0)
Monocytes Absolute: 0.8 10*3/uL (ref 0.1–1.0)
Monocytes Relative: 11.8 % (ref 3.0–12.0)
Neutro Abs: 4.2 10*3/uL (ref 1.4–7.7)
Neutrophils Relative %: 64.7 % (ref 43.0–77.0)
Platelets: 289 10*3/uL (ref 150.0–400.0)
RBC: 4.21 Mil/uL (ref 3.87–5.11)
RDW: 14.2 % (ref 11.5–14.6)
WBC: 6.5 10*3/uL (ref 4.5–10.5)

## 2012-07-15 LAB — LIPID PANEL
Cholesterol: 206 mg/dL — ABNORMAL HIGH (ref 0–200)
HDL: 85.7 mg/dL (ref >39.00)
Total CHOL/HDL Ratio: 2
Triglycerides: 86 mg/dL (ref 0.0–149.0)
VLDL: 17.2 mg/dL (ref 0.0–40.0)

## 2012-07-15 LAB — HEPATIC FUNCTION PANEL
ALT: 15 U/L (ref 0–35)
AST: 21 U/L (ref 0–37)
Albumin: 3.9 g/dL (ref 3.5–5.2)
Alkaline Phosphatase: 66 U/L (ref 39–117)
Bilirubin, Direct: 0 mg/dL (ref 0.0–0.3)
Total Bilirubin: 0.5 mg/dL (ref 0.3–1.2)
Total Protein: 7.2 g/dL (ref 6.0–8.3)

## 2012-07-15 LAB — LDL CHOLESTEROL, DIRECT: Direct LDL: 103.1 mg/dL

## 2012-07-15 LAB — TSH: TSH: 1.4 u[IU]/mL (ref 0.35–5.50)

## 2012-08-01 ENCOUNTER — Other Ambulatory Visit: Payer: Self-pay | Admitting: *Deleted

## 2012-08-01 DIAGNOSIS — F329 Major depressive disorder, single episode, unspecified: Secondary | ICD-10-CM

## 2012-08-01 MED ORDER — SERTRALINE HCL 100 MG PO TABS: 100.0000 mg | ORAL_TABLET | Freq: Every day | ORAL | Status: DC

## 2012-08-02 ENCOUNTER — Ambulatory Visit (INDEPENDENT_AMBULATORY_CARE_PROVIDER_SITE_OTHER): Payer: BC Managed Care – PPO | Admitting: Family Medicine

## 2012-08-02 ENCOUNTER — Encounter: Payer: Self-pay | Admitting: Family Medicine

## 2012-08-02 VITALS — BP 120/74 | Temp 97.9°F | Ht 60.75 in | Wt 112.0 lb

## 2012-08-02 DIAGNOSIS — Z0000 Encounter for general adult medical examination without abnormal findings: Secondary | ICD-10-CM

## 2012-08-02 DIAGNOSIS — E039 Hypothyroidism, unspecified: Secondary | ICD-10-CM

## 2012-08-02 DIAGNOSIS — F329 Major depressive disorder, single episode, unspecified: Secondary | ICD-10-CM

## 2012-08-02 DIAGNOSIS — E538 Deficiency of other specified B group vitamins: Secondary | ICD-10-CM

## 2012-08-02 DIAGNOSIS — B0089 Other herpesviral infection: Secondary | ICD-10-CM | POA: Insufficient documentation

## 2012-08-02 DIAGNOSIS — M899 Disorder of bone, unspecified: Secondary | ICD-10-CM

## 2012-08-02 DIAGNOSIS — D802 Selective deficiency of immunoglobulin A [IgA]: Secondary | ICD-10-CM

## 2012-08-02 DIAGNOSIS — M949 Disorder of cartilage, unspecified: Secondary | ICD-10-CM

## 2012-08-02 MED ORDER — SERTRALINE HCL 100 MG PO TABS: 100.0000 mg | ORAL_TABLET | Freq: Every day | ORAL | Status: DC

## 2012-08-02 MED ORDER — TRAZODONE HCL 150 MG PO TABS: 150.0000 mg | ORAL_TABLET | Freq: Every day | ORAL | Status: DC

## 2012-08-02 MED ORDER — CYANOCOBALAMIN 1000 MCG/ML IJ SOLN: 1000.0000 ug | INTRAMUSCULAR | Status: DC

## 2012-08-02 MED ORDER — ACYCLOVIR 400 MG PO TABS: ORAL_TABLET | ORAL | Status: DC

## 2012-08-02 MED ORDER — LEVOTHYROXINE SODIUM 75 MCG PO TABS: 75.0000 ug | ORAL_TABLET | Freq: Every day | ORAL | Status: DC

## 2012-08-02 NOTE — Patient Instructions (Signed)
Continue the Desyrel Zoloft and Synthroid,,,,,,,, take all 3 at bedtime  Continue the vitamin B12 shots 1 cc monthly  Increase acyclovir to 4 mg 4 times daily when necessary  Return in one year for general physical examination sooner if any problems  Remember to do a thorough breast exam monthly and gets her annual mammogram

## 2012-08-02 NOTE — Progress Notes (Signed)
  Subjective:    Patient ID: Jo Gomez, female    DOB: 1946-03-12, 67 y.o.   MRN: [id number]  HPI Jo Gomez is a 66 year old female nonsmoker who comes in today for general physical examination because of a history of recurrent HSV 1 usually in the right hip area, vitamin B12 deficiency, post menopausal hot flashes, hypothyroidism, and history of mild depression his sleep dysfunction  Her Estratest at bedtime as prescribed by her gynecologist. She takes a half a tab 3 times a week. We discussed the potential negative impact of long-term hormones. She is aware that but wishes to continue  She gets routine eye care, dental care, BSE monthly, and you mammography, colonoscopy normal, tetanus 2004, Pneumovax x2, seasonal flu shot next week. She states she had an episode of shingles last spring and declines the shingles vaccine.   Review of Systems  Constitutional: Negative.   HENT: Negative.   Eyes: Negative.   Respiratory: Negative.   Cardiovascular: Negative.   Gastrointestinal: Negative.   Genitourinary: Negative.   Musculoskeletal: Negative.   Neurological: Negative.   Hematological: Negative.   Psychiatric/Behavioral: Negative.        Objective:   Physical Exam  Constitutional: She appears well-developed and well-nourished.  HENT:  Head: Normocephalic and atraumatic.  Right Ear: External ear normal.  Left Ear: External ear normal.  Nose: Nose normal.  Mouth/Throat: Oropharynx is clear and moist.  Eyes: EOM are normal. Pupils are equal, round, and reactive to light.  Neck: Normal range of motion. Neck supple. No thyromegaly present.  Cardiovascular: Normal rate, regular rhythm, normal heart sounds and intact distal pulses.  Exam reveals no gallop and no friction rub.   No murmur heard. Pulmonary/Chest: Effort normal and breath sounds normal.  Abdominal: Soft. Bowel sounds are normal. She exhibits no distension and no mass. There is no tenderness. There is no rebound.   Genitourinary:       Bilateral breast exam normal  Musculoskeletal: Normal range of motion.  Lymphadenopathy:    She has no cervical adenopathy.  Neurological: She is alert. She has normal reflexes. No cranial nerve deficit. She exhibits normal muscle tone. Coordination normal.  Skin: Skin is warm and dry.       Total body skin exam normal  Psychiatric: She has a normal mood and affect. Her behavior is normal. Judgment and thought content normal.          Assessment & Plan:  Healthy female  History of recurrent HSV cutaneous refill acyclovir  Vitamin B12 deficiency continue B12 shots 1 cc monthly  Hypothyroidism continue Synthroid 75 mcg daily  History of mild depression continue Zoloft and Desyrel combination  Postmenopausal symptoms HRT via GYN

## 2012-08-04 ENCOUNTER — Other Ambulatory Visit: Payer: Self-pay | Admitting: Obstetrics & Gynecology

## 2012-08-04 DIAGNOSIS — Z1231 Encounter for screening mammogram for malignant neoplasm of breast: Secondary | ICD-10-CM

## 2012-08-08 ENCOUNTER — Ambulatory Visit (INDEPENDENT_AMBULATORY_CARE_PROVIDER_SITE_OTHER): Payer: BC Managed Care – PPO | Admitting: *Deleted

## 2012-08-08 DIAGNOSIS — Z23 Encounter for immunization: Secondary | ICD-10-CM

## 2012-09-07 ENCOUNTER — Other Ambulatory Visit: Payer: Self-pay | Admitting: Obstetrics and Gynecology

## 2012-09-07 ENCOUNTER — Other Ambulatory Visit: Payer: Self-pay | Admitting: Obstetrics & Gynecology

## 2012-09-07 DIAGNOSIS — M8580 Other specified disorders of bone density and structure, unspecified site: Secondary | ICD-10-CM

## 2012-09-14 ENCOUNTER — Ambulatory Visit
Admission: RE | Admit: 2012-09-14 | Discharge: 2012-09-14 | Disposition: A | Discharge status: Home / Self Care | Payer: BC Managed Care – PPO | Source: Ambulatory Visit | Attending: Obstetrics & Gynecology | Admitting: Obstetrics & Gynecology

## 2012-09-14 ENCOUNTER — Ambulatory Visit
Admission: RE | Admit: 2012-09-14 | Discharge: 2012-09-14 | Disposition: A | Discharge status: Home / Self Care | Payer: BC Managed Care – PPO | Source: Ambulatory Visit | Attending: Obstetrics and Gynecology | Admitting: Obstetrics and Gynecology

## 2012-09-14 DIAGNOSIS — M8580 Other specified disorders of bone density and structure, unspecified site: Secondary | ICD-10-CM

## 2012-09-14 DIAGNOSIS — Z1231 Encounter for screening mammogram for malignant neoplasm of breast: Secondary | ICD-10-CM

## 2012-11-09 ENCOUNTER — Ambulatory Visit (INDEPENDENT_AMBULATORY_CARE_PROVIDER_SITE_OTHER): Payer: BC Managed Care – PPO | Admitting: Family Medicine

## 2012-11-09 ENCOUNTER — Encounter: Payer: Self-pay | Admitting: Family Medicine

## 2012-11-09 VITALS — BP 112/80 | HR 78 | Temp 99.8°F | Wt 113.0 lb

## 2012-11-09 DIAGNOSIS — J329 Chronic sinusitis, unspecified: Secondary | ICD-10-CM

## 2012-11-09 MED ORDER — AMOXICILLIN-POT CLAVULANATE 875-125 MG PO TABS: 1.0000 | ORAL_TABLET | Freq: Two times a day (BID) | ORAL | Status: DC

## 2012-11-09 NOTE — Patient Instructions (Signed)
INSTRUCTIONS FOR UPPER RESPIRATORY INFECTION:  --As we discussed, we have prescribed a new medication (AUGMENTIN) for you at this appointment. We discussed the common and serious potential adverse effects of this medication and you can review these and more with the pharmacist when you pick up your medication.  Please follow the instructions for use carefully and notify us immediately if you have any problems taking this medication.  -plenty of rest and fluids  -nasal saline wash 2-3 times daily (use prepackaged nasal saline or bottled/distilled water if making your own)   -can use tylenol or ibuprofen as directed for aches and sorethroat  -in the winter time, using a humidifier at night is helpful (please follow cleaning instructions)  -if you are taking a cough medication - use only as directed, may also try a teaspoon of honey to coat the throat and throat lozenges  -for sore throat, salt water gargles can help  -follow up if you have fevers, facial pain, tooth pain, difficulty breathing or are worsening or not getting better in 5-7 days

## 2012-11-09 NOTE — Progress Notes (Signed)
Chief Complaint  Patient presents with  . Sinusitis    HPI:  Sinus congestion and cough: -started about 1 month ago -symptoms: nasal congestion, drainage in throat, cough, a little L maxillary sinus pain and tooth pain -denies: fevers, SOB, NVD -has tried nasal sinus rinse -sick contacts: partner had a cold but got better after amoxicillin -hx of sinusitis in the past -allergic to sulfa and erythramycin, she has tolerated augmentin fine  ROS: See pertinent positives and negatives per HPI.  No past medical history on file.  No family history on file.  History   Social History  . Marital Status: Single    Spouse Name: N/A    Number of Children: N/A  . Years of Education: N/A   Social History Main Topics  . Smoking status: Former Games developer  . Smokeless tobacco: Never Used  . Alcohol Use: 1.0 oz/week    2 drink(s) per week  . Drug Use: No  . Sexually Active: None   Other Topics Concern  . None   Social History Narrative  . None    Current outpatient prescriptions:acyclovir (ZOVIRAX) 400 MG tablet, One tablet 4 times daily when necessary, Disp: 50 tablet, Rfl: 3;  B-D TB SYRINGE 1CC/25GX5/8" 25G X 5/8" 1 ML MISC, USE AS DIRECTED FOR (B-12) INJECTIONS, Disp: 100 each, Rfl: 0;  cyanocobalamin (,VITAMIN B-12,) 1000 MCG/ML injection, Inject 1 mL (1,000 mcg total) into the skin every 30 (thirty) days., Disp: 10 mL, Rfl: 1 estrogen-methylTESTOSTERone (ESTRATEST H.S.) 0.625-1.25 MG per tablet, Take 1 tablet by mouth. Half tab three times weekly , Disp: , Rfl: ;  levothyroxine (SYNTHROID, LEVOTHROID) 75 MCG tablet, Take 1 tablet (75 mcg total) by mouth daily., Disp: 100 tablet, Rfl: 3;  sertraline (ZOLOFT) 100 MG tablet, Take 1 tablet (100 mg total) by mouth daily., Disp: 100 tablet, Rfl: 3 traZODone (DESYREL) 150 MG tablet, Take 1 tablet (150 mg total) by mouth at bedtime., Disp: 100 tablet, Rfl: 3;  amoxicillin-clavulanate (AUGMENTIN) 875-125 MG per tablet, Take 1 tablet by mouth 2  (two) times daily., Disp: 20 tablet, Rfl: 0  EXAM:  Filed Vitals:   11/09/12 0832  BP: 112/80  Pulse: 78  Temp: 99.8 F (37.7 C)    Body mass index is 21.53 kg/(m^2).  GENERAL: vitals reviewed and listed above, alert, oriented, appears well hydrated and in no acute distress  HEENT: atraumatic, conjunttiva clear, no obvious abnormalities on inspection of external nose and ears, normal appearance of ear canals and TMs, yellow nasal congestion, mild post oropharyngeal erythema with PND, no tonsillar edema or exudate, L max sinus TTP  NECK: no obvious masses on inspection  LUNGS: clear to auscultation bilaterally, no wheezes, rales or rhonchi, good air movement  CV: HRRR, no peripheral edema  MS: moves all extremities without noticeable abnormality  PSYCH: pleasant and cooperative, no obvious depression or anxiety  ASSESSMENT AND PLAN:  Discussed the following assessment and plan:  Sinusitis - Plan: amoxicillin-clavulanate (AUGMENTIN) 875-125 MG per tablet  -4 weeks of symptoms with with sinus pain, no fevers - will tx with augmentin - risks and return precuations discussed -Patient advised to return or notify a doctor immediately if symptoms worsen or persist or new concerns arise.  Patient Instructions  INSTRUCTIONS FOR UPPER RESPIRATORY INFECTION:  --As we discussed, we have prescribed a new medication (AUGMENTIN) for you at this appointment. We discussed the common and serious potential adverse effects of this medication and you can review these and more with the pharmacist when you  pick up your medication.  Please follow the instructions for use carefully and notify us immediately if you have any problems taking this medication.  -plenty of rest and fluids  -nasal saline wash 2-3 times daily (use prepackaged nasal saline or bottled/distilled water if making your own)   -can use tylenol or ibuprofen as directed for aches and sorethroat  -in the winter time, using a  humidifier at night is helpful (please follow cleaning instructions)  -if you are taking a cough medication - use only as directed, may also try a teaspoon of honey to coat the throat and throat lozenges  -for sore throat, salt water gargles can help  -follow up if you have fevers, facial pain, tooth pain, difficulty breathing or are worsening or not getting better in 5-7 days      Aidon Klemens R.

## 2012-11-24 ENCOUNTER — Other Ambulatory Visit: Payer: Self-pay | Admitting: Obstetrics and Gynecology

## 2012-11-24 ENCOUNTER — Telehealth: Payer: Self-pay | Admitting: Obstetrics and Gynecology

## 2012-11-24 MED ORDER — ALENDRONATE SODIUM 10 MG PO TABS: 10.0000 mg | ORAL_TABLET | Freq: Every day | ORAL | Status: DC

## 2012-11-24 NOTE — Telephone Encounter (Signed)
Pt wants to switch her medication. She is currently taking actonel and wants to either switch to the generic (if there is one) or Fosamax. Aconel is too expensive. Please advise.

## 2012-11-24 NOTE — Telephone Encounter (Signed)
At [facility] in Jan 2014 patient was started on Actonel to be "gentle" on her stomach.  States she has done well with this but it is too expensive and she wants to at least try the generic for Fosamax.  Requests 3 month supply with refills tl [employer].

## 2012-11-25 NOTE — Progress Notes (Signed)
Patient notified at 1018 11-25-12/sy

## 2013-03-29 ENCOUNTER — Other Ambulatory Visit: Payer: Self-pay

## 2013-05-26 ENCOUNTER — Ambulatory Visit (INDEPENDENT_AMBULATORY_CARE_PROVIDER_SITE_OTHER): Payer: Medicare Other

## 2013-05-26 DIAGNOSIS — Z23 Encounter for immunization: Secondary | ICD-10-CM

## 2013-05-31 ENCOUNTER — Encounter: Payer: Self-pay | Admitting: Family

## 2013-05-31 ENCOUNTER — Ambulatory Visit (INDEPENDENT_AMBULATORY_CARE_PROVIDER_SITE_OTHER): Payer: Medicare Other | Admitting: Family

## 2013-05-31 VITALS — BP 110/80 | HR 62 | Temp 98.3°F | Wt 115.0 lb

## 2013-05-31 DIAGNOSIS — J329 Chronic sinusitis, unspecified: Secondary | ICD-10-CM

## 2013-05-31 DIAGNOSIS — J309 Allergic rhinitis, unspecified: Secondary | ICD-10-CM

## 2013-05-31 MED ORDER — AMOXICILLIN 500 MG PO TABS: 1000.0000 mg | ORAL_TABLET | Freq: Two times a day (BID) | ORAL | Status: AC

## 2013-05-31 MED ORDER — FLUTICASONE PROPIONATE 50 MCG/ACT NA SUSP: 2.0000 | Freq: Every day | NASAL | Status: DC

## 2013-05-31 NOTE — Progress Notes (Signed)
Subjective:    Patient ID: Jo Gomez, female    DOB: July 28, 1946, 67 y.o.   MRN: [id number]  HPI 67 year old white female, patient of Dr. Tawanna Cooler is in today with complaints of cough, congestion, postnasal drip and green to gray sputum production has been ongoing x1 month but worsening over the last week. She's been taking hydrocodone for facial pain, and he is in a sinus ranch.   Review of Systems  Constitutional: Negative.   HENT: Positive for postnasal drip, rhinorrhea and sinus pressure.   Respiratory: Negative.   Cardiovascular: Negative.   Endocrine: Negative.   Musculoskeletal: Negative.   Allergic/Immunologic: Negative.   Psychiatric/Behavioral: Negative.    No past medical history on file.  History   Social History  . Marital Status: Single    Spouse Name: N/A    Number of Children: N/A  . Years of Education: N/A   Occupational History  . Not on file.   Social History Main Topics  . Smoking status: Former Games developer  . Smokeless tobacco: Never Used  . Alcohol Use: 1.0 oz/week    2 drink(s) per week  . Drug Use: No  . Sexual Activity: Not on file   Other Topics Concern  . Not on file   Social History Narrative  . No narrative on file    No past surgical history on file.  No family history on file.  Allergies  Allergen Reactions  . Clarithromycin   . Sulfonamide Derivatives   . Tetracycline     Current Outpatient Prescriptions on File Prior to Visit  Medication Sig Dispense Refill  . acyclovir (ZOVIRAX) 400 MG tablet One tablet 4 times daily when necessary  50 tablet  3  . alendronate (FOSAMAX) 10 MG tablet Take 1 tablet (10 mg total) by mouth daily before breakfast. Take with a full glass of water on an empty stomach.  90 tablet  3  . B-D TB SYRINGE 1CC/25GX5/8" 25G X 5/8" 1 ML MISC USE AS DIRECTED FOR (B-12) INJECTIONS  100 each  0  . cyanocobalamin (,VITAMIN B-12,) 1000 MCG/ML injection Inject 1 mL (1,000 mcg total) into the skin every 30  (thirty) days.  10 mL  1  . levothyroxine (SYNTHROID, LEVOTHROID) 75 MCG tablet Take 1 tablet (75 mcg total) by mouth daily.  100 tablet  3  . sertraline (ZOLOFT) 100 MG tablet Take 1 tablet (100 mg total) by mouth daily.  100 tablet  3  . traZODone (DESYREL) 150 MG tablet Take 1 tablet (150 mg total) by mouth at bedtime.  100 tablet  3  . amoxicillin-clavulanate (AUGMENTIN) 875-125 MG per tablet Take 1 tablet by mouth 2 (two) times daily.  20 tablet  0  . estrogen-methylTESTOSTERone (ESTRATEST H.S.) 0.625-1.25 MG per tablet Take 1 tablet by mouth. Half tab three times weekly        No current facility-administered medications on file prior to visit.    BP 110/80  Pulse 62  Temp(Src) 98.3 F (36.8 C) (Oral)  Wt 115 lb (52.164 kg)  BMI 21.91 kg/m2chart    Objective:   Physical Exam  Constitutional: She is oriented to person, place, and time. She appears well-developed and well-nourished.  HENT:  Right Ear: External ear normal.  Left Ear: External ear normal.  Nose: Nose normal.  Mouth/Throat: Oropharynx is clear and moist.  Sinus tenderness to palpation of the maxillary sinuses bilaterally  Neck: Normal range of motion. Neck supple.  Cardiovascular: Normal rate, regular rhythm and normal heart  sounds.   Pulmonary/Chest: Effort normal and breath sounds normal.  Neurological: She is alert and oriented to person, place, and time.  Skin: Skin is warm and dry.  Psychiatric: She has a normal mood and affect.          Assessment & Plan:  Assessment: 1. Acute sinusitis 2. Allergic rhinitis  Plan: Amoxicillin 500 mg 2 caps by mouth twice a day x10 days. Flonase 2 sprays in each nostril once a day. All the office if symptoms worsen or persist. His schedule, and as needed.

## 2013-06-11 ENCOUNTER — Other Ambulatory Visit: Payer: Self-pay | Admitting: Family

## 2013-06-12 ENCOUNTER — Other Ambulatory Visit: Payer: Self-pay | Admitting: Family

## 2013-06-12 ENCOUNTER — Telehealth: Payer: Self-pay | Admitting: Family Medicine

## 2013-06-12 MED ORDER — AMOXICILLIN 500 MG PO CAPS: 500.0000 mg | ORAL_CAPSULE | Freq: Three times a day (TID) | ORAL | Status: DC

## 2013-06-12 NOTE — Telephone Encounter (Signed)
Rx sent and patient is aware. 

## 2013-06-12 NOTE — Telephone Encounter (Signed)
Patient Information:  Caller Name: Cortez  Phone: [phone]  Patient: Jo Gomez  Gender: Female  DOB: 05/20/1946  Age: 67 Years  PCP: Kelle Darting [facility])  Office Follow Up:  Does the office need to follow up with this patient?: Yes  Instructions For The Office: Request another round of antibiotics.  [facility] on [address] (she states she did call in a refill).  PATIENT REQUESTING SECOND ROUDND OF AMOXICILLEN FOR SINUS INFECTION.  RN Note:  Request another round of antibiotics.  [facility] on [address] (she states she did call in a refill).  PATIENT REQUESTING SECOND ROUDND OF AMOXICILLEN FOR SINUS INFECTION.  Symptoms  Reason For Call & Symptoms: Patient was seen by Dr. Selena Batten for a sinus infection on 05/31/13. Marland Kitchen She was given Amoxicillen and completed the medication last night but still has some lingering sinus complaints. SHE REPORTS 10 DAYS IS NEVER ENOUGH. she always requires two rounds.  She would like a refill of the medication. She is Afebrile. +headache pain, sinus drainage, coughing green production.  Reviewed Health History In EMR: Yes  Reviewed Medications In EMR: Yes  Reviewed Allergies In EMR: Yes  Reviewed Surgeries / Procedures: Yes  Date of Onset of Symptoms: 05/31/2013  Treatments Tried: Amoxicillen, Flonase, occasional saliene  Treatments Tried Worked: No  Guideline(s) Used:  Sinus Pain and Congestion  Disposition Per Guideline:   See Today or Tomorrow in Office  Reason For Disposition Reached:   Sinus congestion (pressure, fullness) present > 10 days  Advice Given:  For a Runny Nose With Profuse Discharge:  Nasal mucus and discharge helps to wash viruses and bacteria out of the nose and sinuses.  For a Stuffy Nose - Use Nasal Washes:  Introduction: Saline (salt water) nasal irrigation (nasal wash) is an effective and simple home remedy for treating stuffy nose and sinus congestion. The nose can be irrigated by pouring, spraying, or  squirting salt water into the nose and then letting it run back out.  How it Helps: The salt water rinses out excess mucus, washes out any irritants (dust, allergens) that might be present, and moistens the nasal cavity.  Pain and Fever Medicines:  For pain or fever relief, take either acetaminophen or ibuprofen.  They are over-the-counter (OTC) drugs that help treat both fever and pain. You can buy them at the drugstore.  Ibuprofen (e.g., Motrin, Advil):  Take 400 mg (two 200 mg pills) by mouth every 6 hours.  Another choice is to take 600 mg (three 200 mg pills) by mouth every 8 hours.  Hydration:  Drink plenty of liquids (6-8 glasses of water daily). If the air in your home is dry, use a cool mist humidifier  Call Back If:   Fever lasts longer than 3 days  You become worse.  RN Overrode Recommendation:  Patient Requests Prescription  Request another round of antibiotics.  [facility] on [address] (she states she did call in a refill).  PATIENT REQUESTING SECOND ROUDND OF AMOXICILLEN FOR SINUS INFECTION.

## 2013-06-12 NOTE — Telephone Encounter (Signed)
Jo Gomez give her amoxicillin 500 mg..........Marland Kitchen 1 pill 3 times daily for 10 days dispense 30 refills x1

## 2013-06-12 NOTE — Telephone Encounter (Signed)
Ok to fill 

## 2013-06-20 ENCOUNTER — Ambulatory Visit (INDEPENDENT_AMBULATORY_CARE_PROVIDER_SITE_OTHER): Payer: Medicare Other | Admitting: Family Medicine

## 2013-06-20 ENCOUNTER — Encounter: Payer: Self-pay | Admitting: Family Medicine

## 2013-06-20 VITALS — BP 110/80 | Temp 98.4°F | Wt 114.0 lb

## 2013-06-20 DIAGNOSIS — J322 Chronic ethmoidal sinusitis: Secondary | ICD-10-CM

## 2013-06-20 DIAGNOSIS — J309 Allergic rhinitis, unspecified: Secondary | ICD-10-CM

## 2013-06-20 DIAGNOSIS — D802 Selective deficiency of immunoglobulin A [IgA]: Secondary | ICD-10-CM

## 2013-06-20 MED ORDER — PREDNISONE 20 MG PO TABS: ORAL_TABLET | ORAL | Status: DC

## 2013-06-20 MED ORDER — AMOXICILLIN-POT CLAVULANATE 250-125 MG PO TABS: ORAL_TABLET | ORAL | Status: DC

## 2013-06-20 NOTE — Patient Instructions (Signed)
Stop the amoxicillin  Start Augmentin one twice daily  If at any time you develop diarrhea stop it immediately  Add Pro biotics that we discussed also yogurt  Prednisone 20 mg............ 2 tabs x3 days, 1 tab x3 days, a half a tab x3 days, then a half a tablet Monday Wednesday Friday for a two-week taper

## 2013-06-20 NOTE — Progress Notes (Signed)
  Subjective:    Patient ID: Jo Gomez, female    DOB: Sep 19, 1945, 67 y.o.   MRN: [id number]  HPI Jo Gomez is a 67 year old female nonsmoker who comes in today for evaluation of sinusitis  For the past month she's had head congestion postnasal drip and cough. 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 She took one round of amoxicillin 500 3 times a day for 10 days and then started dissecting because she was still symptomatic with head congestion bringing up a lot of discolored sputum. No fever chills or facial pain. She's also on 40 mg of prednisone daily   Review of Systems    review of systems otherwise negative Objective:   Physical Exam  Well-developed well-nourished female no acute distress vital signs stable she is afebrile HEENT were negative neck was supple no adenopathy lungs are clear      Assessment & Plan:  Sinusitis switch to Augmentin

## 2013-08-07 ENCOUNTER — Other Ambulatory Visit (INDEPENDENT_AMBULATORY_CARE_PROVIDER_SITE_OTHER): Payer: Medicare Other

## 2013-08-07 DIAGNOSIS — Z0000 Encounter for general adult medical examination without abnormal findings: Secondary | ICD-10-CM

## 2013-08-07 DIAGNOSIS — E039 Hypothyroidism, unspecified: Secondary | ICD-10-CM

## 2013-08-07 LAB — CBC WITH DIFFERENTIAL/PLATELET
Basophils Absolute: 0 10*3/uL (ref 0.0–0.1)
Basophils Relative: 0.5 % (ref 0.0–3.0)
Eosinophils Absolute: 0.5 10*3/uL (ref 0.0–0.7)
Eosinophils Relative: 7.9 % — ABNORMAL HIGH (ref 0.0–5.0)
HCT: 37.6 % (ref 36.0–46.0)
Hemoglobin: 12.8 g/dL (ref 12.0–15.0)
Lymphocytes Relative: 23.9 % (ref 12.0–46.0)
Lymphs Abs: 1.5 10*3/uL (ref 0.7–4.0)
MCHC: 34 g/dL (ref 30.0–36.0)
MCV: 92.4 fl (ref 78.0–100.0)
Monocytes Absolute: 0.8 10*3/uL (ref 0.1–1.0)
Monocytes Relative: 12.5 % — ABNORMAL HIGH (ref 3.0–12.0)
Neutro Abs: 3.5 10*3/uL (ref 1.4–7.7)
Neutrophils Relative %: 55.2 % (ref 43.0–77.0)
Platelets: 234 10*3/uL (ref 150.0–400.0)
RBC: 4.07 Mil/uL (ref 3.87–5.11)
RDW: 13.9 % (ref 11.5–14.6)
WBC: 6.4 10*3/uL (ref 4.5–10.5)

## 2013-08-07 LAB — LIPID PANEL
Cholesterol: 213 mg/dL — ABNORMAL HIGH (ref 0–200)
HDL: 82.4 mg/dL (ref >39.00)
Total CHOL/HDL Ratio: 3
Triglycerides: 56 mg/dL (ref 0.0–149.0)
VLDL: 11.2 mg/dL (ref 0.0–40.0)

## 2013-08-07 LAB — POCT URINALYSIS DIPSTICK
Bilirubin, UA: NEGATIVE
Glucose, UA: NEGATIVE
Ketones, UA: NEGATIVE
Nitrite, UA: NEGATIVE
Protein, UA: NEGATIVE
Spec Grav, UA: 1.02
Urobilinogen, UA: 0.2
pH, UA: 5.5

## 2013-08-07 LAB — HEPATIC FUNCTION PANEL
ALT: 25 U/L (ref 0–35)
AST: 25 U/L (ref 0–37)
Albumin: 3.8 g/dL (ref 3.5–5.2)
Alkaline Phosphatase: 80 U/L (ref 39–117)
Bilirubin, Direct: 0.1 mg/dL (ref 0.0–0.3)
Total Bilirubin: 0.5 mg/dL (ref 0.3–1.2)
Total Protein: 6.2 g/dL (ref 6.0–8.3)

## 2013-08-07 LAB — BASIC METABOLIC PANEL
BUN: 14 mg/dL (ref 6–23)
CO2: 28 mEq/L (ref 19–32)
Calcium: 8.7 mg/dL (ref 8.4–10.5)
Chloride: 104 mEq/L (ref 96–112)
Creatinine, Ser: 0.7 mg/dL (ref 0.4–1.2)
GFR: 86.28 mL/min (ref >60.00)
Glucose, Bld: 78 mg/dL (ref 70–99)
Potassium: 4.1 mEq/L (ref 3.5–5.1)
Sodium: 137 mEq/L (ref 135–145)

## 2013-08-07 LAB — LDL CHOLESTEROL, DIRECT: Direct LDL: 114.9 mg/dL

## 2013-08-07 LAB — TSH: TSH: 1.71 u[IU]/mL (ref 0.35–5.50)

## 2013-08-14 ENCOUNTER — Encounter: Payer: BC Managed Care – PPO | Admitting: Family Medicine

## 2013-08-15 ENCOUNTER — Other Ambulatory Visit: Payer: Self-pay

## 2013-08-15 DIAGNOSIS — Z1231 Encounter for screening mammogram for malignant neoplasm of breast: Secondary | ICD-10-CM

## 2013-08-22 ENCOUNTER — Ambulatory Visit (INDEPENDENT_AMBULATORY_CARE_PROVIDER_SITE_OTHER): Payer: Medicare Other | Admitting: Family Medicine

## 2013-08-22 ENCOUNTER — Encounter: Payer: Self-pay | Admitting: Family Medicine

## 2013-08-22 VITALS — BP 120/78 | Temp 98.7°F | Ht 60.75 in | Wt 115.0 lb

## 2013-08-22 DIAGNOSIS — K649 Unspecified hemorrhoids: Secondary | ICD-10-CM

## 2013-08-22 DIAGNOSIS — E039 Hypothyroidism, unspecified: Secondary | ICD-10-CM

## 2013-08-22 DIAGNOSIS — Z0000 Encounter for general adult medical examination without abnormal findings: Secondary | ICD-10-CM

## 2013-08-22 DIAGNOSIS — K219 Gastro-esophageal reflux disease without esophagitis: Secondary | ICD-10-CM

## 2013-08-22 DIAGNOSIS — J309 Allergic rhinitis, unspecified: Secondary | ICD-10-CM

## 2013-08-22 DIAGNOSIS — M899 Disorder of bone, unspecified: Secondary | ICD-10-CM

## 2013-08-22 DIAGNOSIS — K449 Diaphragmatic hernia without obstruction or gangrene: Secondary | ICD-10-CM

## 2013-08-22 DIAGNOSIS — B0089 Other herpesviral infection: Secondary | ICD-10-CM

## 2013-08-22 DIAGNOSIS — E538 Deficiency of other specified B group vitamins: Secondary | ICD-10-CM

## 2013-08-22 DIAGNOSIS — F329 Major depressive disorder, single episode, unspecified: Secondary | ICD-10-CM

## 2013-08-22 MED ORDER — CYANOCOBALAMIN 1000 MCG/ML IJ SOLN: 1000.0000 ug | INTRAMUSCULAR | Status: DC

## 2013-08-22 MED ORDER — SERTRALINE HCL 100 MG PO TABS: 100.0000 mg | ORAL_TABLET | Freq: Every day | ORAL | Status: DC

## 2013-08-22 MED ORDER — TRAZODONE HCL 150 MG PO TABS: 150.0000 mg | ORAL_TABLET | Freq: Every day | ORAL | Status: DC

## 2013-08-22 MED ORDER — LEVOTHYROXINE SODIUM 75 MCG PO TABS: 75.0000 ug | ORAL_TABLET | Freq: Every day | ORAL | Status: DC

## 2013-08-22 MED ORDER — "TUBERCULIN SYRINGE 25G X 5/8"" 1 ML MISC": Status: DC

## 2013-08-22 MED ORDER — ACYCLOVIR 400 MG PO TABS: ORAL_TABLET | ORAL | Status: DC

## 2013-08-22 NOTE — Patient Instructions (Signed)
Continue current medications  Takes Zyrtec plain and 1 shot of steroid nasal spray up each nostril bedtime to see if that will help with the postnasal drip and cough  If after 4-6 weeks it does not help then I would consider an anti-reflex program as outlined by her GI doctor  Return in one year for general physical exam sooner if any problems

## 2013-08-22 NOTE — Progress Notes (Signed)
Pre visit review using our clinic review tool, if applicable. No additional management support is needed unless otherwise documented below in the visit note. 

## 2013-08-22 NOTE — Progress Notes (Signed)
   Subjective:    Patient ID: Jo Gomez, female    DOB: 11-24-1945, 67 y.o.   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 She wonders if it might be better reflux.  Cognitive function normal she walks on a regular basis home health safety reviewed no issues identified, no guns in the house, she does not have a health care power of attorney nor living well. She was referred to [facility]   Review of Systems  Constitutional: Negative.   HENT: Negative.   Eyes: Negative.   Respiratory: Negative.   Cardiovascular: Negative.   Gastrointestinal: Negative.   Endocrine: Negative.   Genitourinary: Negative.   Musculoskeletal: Negative.   Allergic/Immunologic: Negative.   Neurological: Negative.   Hematological: Negative.   Psychiatric/Behavioral: Negative.        Objective:   Physical Exam  Constitutional: She is oriented to person, place, and time. She appears well-developed and well-nourished.  HENT:  Head: Normocephalic and atraumatic.  Right Ear: External ear normal.  Left Ear: External ear normal.  Nose: Nose normal.  Mouth/Throat: Oropharynx is clear and moist.  Eyes: EOM are normal. Pupils are equal, round, and reactive to light.  Neck: Normal range of motion. Neck supple. No thyromegaly present.  Cardiovascular: Normal rate,  regular rhythm, normal heart sounds and intact distal pulses.  Exam reveals no gallop and no friction rub.   No murmur heard. Pulmonary/Chest: Effort normal and breath sounds normal.  Abdominal: Soft. Bowel sounds are normal. She exhibits no distension and no mass. There is no tenderness. There is no rebound.  Genitourinary:  Bilateral breast exam normal  She's had her uterus and ovaries removed pelvic exam not indicated asymptomatic  Musculoskeletal: Normal range of motion.  Lymphadenopathy:    She has no cervical adenopathy.  Neurological: She is alert and oriented to person, place, and time. She has normal reflexes. No cranial nerve deficit. She exhibits normal muscle tone. Coordination normal.  Skin: Skin is warm and dry.  Psychiatric: She has a normal mood and affect. Her behavior is normal. Judgment and thought content normal.          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

## 2013-08-24 HISTORY — PX: BROW LIFT: SHX178

## 2013-09-22 ENCOUNTER — Ambulatory Visit
Admission: RE | Admit: 2013-09-22 | Discharge: 2013-09-22 | Disposition: A | Discharge status: Home / Self Care | Payer: Medicare Other | Source: Ambulatory Visit

## 2013-09-22 DIAGNOSIS — Z1231 Encounter for screening mammogram for malignant neoplasm of breast: Secondary | ICD-10-CM

## 2013-10-13 ENCOUNTER — Other Ambulatory Visit: Payer: Self-pay | Admitting: Family

## 2013-11-23 ENCOUNTER — Encounter: Payer: Self-pay | Admitting: Obstetrics & Gynecology

## 2013-11-23 ENCOUNTER — Ambulatory Visit (INDEPENDENT_AMBULATORY_CARE_PROVIDER_SITE_OTHER): Payer: 59 | Admitting: Obstetrics & Gynecology

## 2013-11-23 VITALS — BP 100/58 | HR 68 | Resp 16 | Ht 59.75 in | Wt 115.6 lb

## 2013-11-23 DIAGNOSIS — Z01419 Encounter for gynecological examination (general) (routine) without abnormal findings: Secondary | ICD-10-CM

## 2013-11-23 MED ORDER — ALENDRONATE SODIUM 10 MG PO TABS: 10.0000 mg | ORAL_TABLET | Freq: Every day | ORAL | Status: DC

## 2013-11-23 MED ORDER — VITAMIN D (ERGOCALCIFEROL) 1.25 MG (50000 UNIT) PO CAPS: ORAL_CAPSULE | ORAL | Status: DC

## 2013-11-23 NOTE — Progress Notes (Signed)
68 y.o. G1P1 Partnered CaucasianF here for annual exam.  Doing well.  No vaginal bleeding.  Had a brow lift in January.  Did wean off HRT.  Had a big change in libido.    Patient's last menstrual period was 08/24/1997.          Sexually active: yes  The current method of family planning is status post hysterectomy.    Exercising: yes  hiking and yoga Smoker:  Former smoker in [place] Maintenance: Pap:  ?2004 History of abnormal Pap:  no MMG:  09/22/13 3D-normal Colonoscopy:  2011-repeat in 5 years BMD:   09/14/12-restarted Fosamax TDaP:  PCP Screening Labs: PCP, Hb today: PCP, Urine today: PCP   reports that she has quit smoking. She has never used smokeless tobacco. She reports that she drinks about 3.0 ounces of alcohol per week. She reports that she does not use illicit drugs.  Past Medical History  Diagnosis Date  . Osteoporosis   . Asthma   . Hiatal hernia   . Low vitamin B12 level     Past Surgical History  Procedure Laterality Date  . Broken arm      surgery for this  . Total abdominal hysterectomy w/ bilateral salpingoophorectomy    . Thyroidectomy      Current Outpatient Prescriptions  Medication Sig Dispense Refill  . acyclovir (ZOVIRAX) 400 MG tablet One tablet 4 times daily when necessary  50 tablet  3  . alendronate (FOSAMAX) 10 MG tablet Take 1 tablet (10 mg total) by mouth daily before breakfast. Take with a full glass of water on an empty stomach.  90 tablet  3  . cyanocobalamin (,VITAMIN B-12,) 1000 MCG/ML injection Inject 1 mL (1,000 mcg total) into the skin every 30 (thirty) days.  30 mL  1  . levothyroxine (SYNTHROID, LEVOTHROID) 75 MCG tablet Take 1 tablet (75 mcg total) by mouth daily.  100 tablet  3  . sertraline (ZOLOFT) 100 MG tablet Take 1 tablet (100 mg total) by mouth daily.  100 tablet  3  . traZODone (DESYREL) 150 MG tablet Take 1 tablet (150 mg total) by mouth at bedtime.  100 tablet  3  . TUBERCULIN SYR 1CC/25GX5/8" (B-D TB SYRINGE  1CC/25GX5/8") 25G X 5/8" 1 ML MISC USE AS DIRECTED FOR (B-12) INJECTIONS  20 each  1  . Vitamin D, Ergocalciferol, (DRISDOL) 50000 UNITS CAPS capsule TAKE 1 CAPSULE ONCE A MONTH  30 capsule  0  . fluticasone (FLONASE) 50 MCG/ACT nasal spray        No current facility-administered medications for this visit.    Family History  Problem Relation Age of Onset  . COPD Father     chronic lung infections  . Heart attack Mother   . Osteoporosis Mother     ROS:  Pertinent items are noted in HPI.  Otherwise, a comprehensive ROS was negative.  Exam:   BP 100/58  Pulse 68  Resp 16  Ht 4' 11.75" (1.518 m)  Wt 115 lb 9.6 oz (52.436 kg)  BMI 22.76 kg/m2  LMP 08/24/1997  Weight change:   Height: 4' 11.75" (151.8 cm)  Ht Readings from Last 3 Encounters:  11/23/13 4' 11.75" (1.518 m)  08/22/13 5' 0.75" (1.543 m)  08/02/12 5' 0.75" (1.543 m)    General appearance: alert, cooperative and appears stated age Head: Normocephalic, without obvious abnormality, atraumatic Neck: no adenopathy, supple, symmetrical, trachea midline and thyroid normal to inspection and palpation Lungs: clear to auscultation bilaterally  Breasts: normal appearance, no masses or tenderness Heart: regular rate and rhythm Abdomen: soft, non-tender; bowel sounds normal; no masses,  no organomegaly Extremities: extremities normal, atraumatic, no cyanosis or edema Skin: Skin color, texture, turgor normal. No rashes or lesions Lymph nodes: Cervical, supraclavicular, and axillary nodes normal. No abnormal inguinal nodes palpated Neurologic: Grossly normal   Pelvic: External genitalia:  no lesions              Urethra:  normal appearing urethra with no masses, tenderness or lesions              Bartholins and Skenes: normal                 Vagina: normal appearing vagina with normal color and discharge, no lesions              Cervix: absent              Pap taken: no Bimanual Exam:  Uterus:  uterus absent               Adnexa: no mass, fullness, tenderness               Rectovaginal: Confirms               Anus:  normal sphincter tone, no lesions  A:  Well Woman with normal exam H/O TAH/BSO Osteopenia Decreased libido  P:   Mammogram yearly BMD next year. On Fosamax 10mg  daily.  #90/4RF pap smear not indicated Trial of topical testosterone.   Return for total testosterone level three months return annually or prn  An After Visit Summary was printed and given to the patient.

## 2013-11-23 NOTE — Patient Instructions (Signed)

## 2013-11-28 ENCOUNTER — Telehealth: Payer: Self-pay | Admitting: Family Medicine

## 2013-11-28 NOTE — Telephone Encounter (Signed)
Pt called about vaccines that Dr Sherren Mocha wanted her to have but r not sure what they were pt would like to schedule //

## 2013-11-29 NOTE — Telephone Encounter (Signed)
Spoke with patient and vaccines scheduled

## 2013-11-30 ENCOUNTER — Ambulatory Visit (INDEPENDENT_AMBULATORY_CARE_PROVIDER_SITE_OTHER): Payer: Medicare Other | Admitting: *Deleted

## 2013-11-30 DIAGNOSIS — Z23 Encounter for immunization: Secondary | ICD-10-CM

## 2013-12-15 ENCOUNTER — Encounter: Payer: Self-pay | Admitting: Physician Assistant

## 2013-12-15 ENCOUNTER — Ambulatory Visit (INDEPENDENT_AMBULATORY_CARE_PROVIDER_SITE_OTHER): Payer: Medicare Other | Admitting: Physician Assistant

## 2013-12-15 VITALS — BP 110/70 | HR 65 | Temp 98.3°F | Resp 16 | Wt 114.0 lb

## 2013-12-15 DIAGNOSIS — J309 Allergic rhinitis, unspecified: Secondary | ICD-10-CM

## 2013-12-15 DIAGNOSIS — J019 Acute sinusitis, unspecified: Secondary | ICD-10-CM

## 2013-12-15 DIAGNOSIS — J329 Chronic sinusitis, unspecified: Secondary | ICD-10-CM

## 2013-12-15 MED ORDER — AMOXICILLIN-POT CLAVULANATE 500-125 MG PO TABS: 1.0000 | ORAL_TABLET | Freq: Three times a day (TID) | ORAL | Status: DC

## 2013-12-15 NOTE — Patient Instructions (Signed)
Amoxicillin clavulanic acid 500 mg by mouth 3 times a day.  Follow up with your PCP regarding refills for chronic sinusitis.  Plain OTC Mucinex (NOT D) for thick secretions ;force NON dairy fluids .    OTC Flonase OR Nasacort AQ 1 spray in each nostril twice a day as needed. Use the "crossover" technique into opposite nostril spraying toward opposite ear @ 45 degree angle, not straight up into nostril.   Plain OTC Allegra (NOT D )  160 daily , OTC Loratidine 10 mg , OR OTC Zyrtec 10 mg @ bedtime  as needed for itchy eyes & sneezing.  Followup if symptoms fail to improve or worsen despite treatment.  Sinusitis Sinusitis is redness, soreness, and puffiness (inflammation) of the air pockets in the bones of your face (sinuses). The redness, soreness, and puffiness can cause air and mucus to get trapped in your sinuses. This can allow germs to grow and cause an infection.  HOME CARE   Drink enough fluids to keep your pee (urine) clear or pale yellow.  Use a humidifier in your home.  Run a hot shower to create steam in the bathroom. Sit in the bathroom with the door closed. Breathe in the steam 3 4 times a day.  Put a warm, moist washcloth on your face 3 4 times a day, or as told by your doctor.  Use salt water sprays (saline sprays) to wet the thick fluid in your nose. This can help the sinuses drain.  Only take medicine as told by your doctor. GET HELP RIGHT AWAY IF:   Your pain gets worse.  You have very bad headaches.  You are sick to your stomach (nauseous).  You throw up (vomit).  You are very sleepy (drowsy) all the time.  Your face is puffy (swollen).  Your vision changes.  You have a stiff neck.  You have trouble breathing. MAKE SURE YOU:   Understand these instructions.  Will watch your condition.  Will get help right away if you are not doing well or get worse. Document Released: 01/27/2008 Document Revised: 05/04/2012 Document Reviewed: 03/15/2012 [facility]  Patient Information 2014 [place].

## 2013-12-15 NOTE — Progress Notes (Signed)
Subjective:    Patient ID: Jo Gomez, female    DOB: 1946-04-02, 68 y.o.   MRN: [id number]  Sinusitis   Patient's a 68 year old Caucasian female presenting to the clinic today for an episode of sinusitis. Patient states that last week she began feeling symptoms of sinus pressure, headache, postnasal drip with thick yellow mucus, ear pressure, nonproductive cough, and fever and chills. Patient states that she has taken Flonase and sinus rinses that seemed to offer mild relief. 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 She states that she is normally written for amoxicillin 500 mg 3 times a day with 1 refill, and was prescribed this a year ago. She states that she started taking her refill of amoxicillin last week and today is on a day 9 of the ten-day course. She states that she is not feeling any better, and would like another prescription of her amoxicillin to carry her course out to 20 days. She would also like a refill because she is going out of the country in 2 weeks time. She denies nausea, vomiting, diarrhea, and shortness of breath.   Review of Systems As per history of present illness and are otherwise negative.  Past Medical History  Diagnosis Date  . Osteoporosis   . Asthma   . Hiatal hernia   . Low vitamin B12 level    Past Surgical History  Procedure Laterality Date  . Broken arm      surgery for this  . Total abdominal hysterectomy w/ bilateral salpingoophorectomy  1999  . Thyroidectomy  2000  . Brow lift  1/15    reports that she has quit smoking. She has never used smokeless tobacco. She reports that she drinks about 3 ounces of alcohol per week. She reports that she does not use illicit drugs. family history includes COPD in her father; Heart attack in her mother; Osteoporosis in her mother. Allergies  Allergen Reactions  . Clarithromycin   . Codeine   . Sulfonamide  Derivatives   . Tetracycline         Objective:   Physical Exam  Nursing note and vitals reviewed. Constitutional: She is oriented to person, place, and time. She appears well-developed and well-nourished. No distress.  HENT:  Head: Normocephalic and atraumatic.  Right Ear: External ear normal.  Left Ear: External ear normal.  Nose: Nose normal.  Mouth/Throat: No oropharyngeal exudate.  Oropharynx slightly erythematous with cobblestoning, no exudate.  Bilateral TMs are normal.  Right maxillary sinus exquisitely tender to palpation. Left maxillary sinus nontender to palpation. Bilateral frontal sinuses vaguely tender to palpation.  Eyes: Conjunctivae and EOM are normal. Pupils are equal, round, and reactive to light.  Neck: Normal range of motion. Neck supple. No JVD present. No tracheal deviation present. No thyromegaly present.  Cardiovascular: Normal rate, regular rhythm, normal heart sounds and intact distal pulses.  Exam reveals no gallop and no friction rub.   No murmur heard. Pulmonary/Chest: Effort normal and breath sounds normal. No stridor. No respiratory distress. She has no wheezes. She has no rales. She exhibits no tenderness.  Abdominal: Soft. Bowel sounds are normal. There is no tenderness.  Musculoskeletal: Normal range of motion. She exhibits no edema.  Lymphadenopathy:    She has no cervical adenopathy.  Neurological: She is alert and oriented to person, place, and time. She has normal reflexes. No cranial nerve deficit. Coordination normal.  Skin: Skin is warm  and dry. No rash noted. She is not diaphoretic. No erythema. No pallor.  Psychiatric: She has a normal mood and affect. Her behavior is normal. Judgment and thought content normal.   Filed Vitals:   12/15/13 0821  BP: 110/70  Pulse: 65  Temp: 98.3 F (36.8 C)  Resp: 16   Lab Results  Component Value Date   WBC 6.4 08/07/2013   HGB 12.8 08/07/2013   HCT 37.6 08/07/2013   PLT 234.0 08/07/2013    GLUCOSE 78 08/07/2013   CHOL 213* 08/07/2013   TRIG 56.0 08/07/2013   HDL 82.40 08/07/2013   LDLDIRECT 114.9 08/07/2013   LDLCALC 104* 07/06/2011   ALT 25 08/07/2013   AST 25 08/07/2013   NA 137 08/07/2013   K 4.1 08/07/2013   CL 104 08/07/2013   CREATININE 0.7 08/07/2013   BUN 14 08/07/2013   CO2 28 08/07/2013   TSH 1.71 08/07/2013      Assessment & Plan:  Jo Gomez was seen today for sinusitis.  Diagnoses and associated orders for this visit:  Sinusitis, chronic - amoxicillin-clavulanate (AUGMENTIN) 500-125 MG per tablet; Take 1 tablet (500 mg total) by mouth 3 (three) times daily.  Allergic rhinitis  Acute sinusitis - amoxicillin-clavulanate (AUGMENTIN) 500-125 MG per tablet; Take 1 tablet (500 mg total) by mouth 3 (three) times daily.    Patient Instructions  Amoxicillin clavulanic acid 500 mg by mouth 3 times a day.  Follow up with your PCP regarding refills for chronic sinusitis.  Plain OTC Mucinex (NOT D) for thick secretions ;force NON dairy fluids .    OTC Flonase OR Nasacort AQ 1 spray in each nostril twice a day as needed. Use the "crossover" technique into opposite nostril spraying toward opposite ear @ 45 degree angle, not straight up into nostril.   Plain OTC Allegra (NOT D )  160 daily , OTC Loratidine 10 mg , OR OTC Zyrtec 10 mg @ bedtime  as needed for itchy eyes & sneezing.  Followup if symptoms fail to improve or worsen despite treatment.

## 2013-12-15 NOTE — Progress Notes (Signed)
Pre visit review using our clinic review tool, if applicable. No additional management support is needed unless otherwise documented below in the visit note. 

## 2014-01-05 ENCOUNTER — Encounter: Payer: Self-pay | Admitting: Family Medicine

## 2014-01-05 ENCOUNTER — Ambulatory Visit (INDEPENDENT_AMBULATORY_CARE_PROVIDER_SITE_OTHER): Payer: Medicare Other | Admitting: Family Medicine

## 2014-01-05 VITALS — BP 127/99 | HR 67 | Temp 98.5°F | Wt 116.0 lb

## 2014-01-05 DIAGNOSIS — J019 Acute sinusitis, unspecified: Secondary | ICD-10-CM

## 2014-01-05 MED ORDER — AMOXICILLIN-POT CLAVULANATE 875-125 MG PO TABS: 1.0000 | ORAL_TABLET | Freq: Two times a day (BID) | ORAL | Status: DC

## 2014-01-05 NOTE — Progress Notes (Signed)
Pre visit review using our clinic review tool, if applicable. No additional management support is needed unless otherwise documented below in the visit note. 

## 2014-01-08 ENCOUNTER — Encounter: Payer: Self-pay | Admitting: Family Medicine

## 2014-01-08 NOTE — Progress Notes (Signed)
   Subjective:    Patient ID: Jo Gomez, female    DOB: 06/20/1946, 68 y.o.   MRN: [id number]  HPI Here for recurrent sinus sx with pressure, PND, ST and a dry cough. She was seen on 12-15-13 and was given 7 days of Augmentin. She improved but then relapsed after the antibiotic ran out.    Review of Systems  Constitutional: Negative.   HENT: Positive for congestion, postnasal drip and sinus pressure.   Eyes: Negative.   Respiratory: Positive for cough.        Objective:   Physical Exam  Constitutional: She appears well-developed and well-nourished.  HENT:  Right Ear: External ear normal.  Left Ear: External ear normal.  Nose: Nose normal.  Mouth/Throat: Oropharynx is clear and moist.  Eyes: Conjunctivae are normal.  Pulmonary/Chest: Effort normal and breath sounds normal.  Lymphadenopathy:    She has no cervical adenopathy.          Assessment & Plan:  Recurrent sinusitis. Get back on Augmentin for a longer course.

## 2014-02-20 ENCOUNTER — Telehealth: Payer: Self-pay | Admitting: Obstetrics & Gynecology

## 2014-02-20 NOTE — Telephone Encounter (Signed)
Patient canceled her upcoming lab appointment 02/22/14. Patient says she will call to reschedule.

## 2014-02-22 ENCOUNTER — Other Ambulatory Visit: Payer: 59

## 2014-03-29 ENCOUNTER — Telehealth: Payer: Self-pay | Admitting: Obstetrics & Gynecology

## 2014-03-29 NOTE — Telephone Encounter (Signed)
Patient calling to speak with nurse or schedule an appointment with Dr. Sabra Heck for a consult about medication management. The patient had an RX for a cream (? Name) but wants to go back to Estratest low dose. Please advise?  [facility]

## 2014-03-29 NOTE — Telephone Encounter (Signed)
Spoke with patient. Patient states that she was taken off Estratest about one year ago and "I haven't been the same since." Patient was started on trial of testosterone but states that it gave her acne and did not help. Patient has been having hot flashes and would like to go back on Estratest. " Maybe even for a month or so to see if it helps." Advised patient would send message to [facility] with request and give patient a call back. Patient is agreeable.

## 2014-03-30 NOTE — Telephone Encounter (Signed)
Call to patient and advised of Dr Ammie Ferrier recommendation for consult to discuss pros/cons. Patient agreeable. Consult scheduled for 04-03-14 at 1030.   Encounter closed.

## 2014-03-30 NOTE — Telephone Encounter (Signed)
She has risks for stroke and heart attack as well as breast cancer.  I really don't think it is a good idea even for a month but I am open to discussing this with her and discussing other options.  I think we should talk in person first.  Please see if she is willing to come in for a consult.

## 2014-04-02 ENCOUNTER — Encounter: Payer: Self-pay | Admitting: Obstetrics & Gynecology

## 2014-04-03 ENCOUNTER — Institutional Professional Consult (permissible substitution): Payer: 59 | Admitting: Obstetrics & Gynecology

## 2014-04-03 ENCOUNTER — Telehealth: Payer: Self-pay | Admitting: Obstetrics & Gynecology

## 2014-04-03 NOTE — Telephone Encounter (Signed)
Spoke with patient re: Dr. Sabra Heck delayed in surgery today so HRT consult appointment will need rescheduled.  Dr. Sabra Heck, Can we offer the patient 11:45 Friday 04/06/14?  Lavella Hammock

## 2014-04-03 NOTE — Telephone Encounter (Signed)
Yes.  Thanks.  I'm not going to that proficiency training.  Don't think I have enough time this week for that.

## 2014-04-03 NOTE — Telephone Encounter (Signed)
Patient accepted the appointment

## 2014-04-06 ENCOUNTER — Ambulatory Visit (INDEPENDENT_AMBULATORY_CARE_PROVIDER_SITE_OTHER): Payer: Medicare Other | Admitting: Obstetrics & Gynecology

## 2014-04-06 VITALS — BP 116/60 | HR 60 | Resp 16 | Ht 59.75 in | Wt 118.2 lb

## 2014-04-06 DIAGNOSIS — N951 Menopausal and female climacteric states: Secondary | ICD-10-CM

## 2014-04-06 MED ORDER — EST ESTROGENS-METHYLTEST 0.625-1.25 MG PO TABS: 1.0000 | ORAL_TABLET | Freq: Every day | ORAL | Status: DC

## 2014-04-06 NOTE — Progress Notes (Signed)
Subjective:     Patient ID: Jo Gomez, female   DOB: [birth year], 68 y.o.   MRN: [id number]  HPI Very nice 68 year old MWF here to discuss symptoms since stopping HRT.  She saw Dr. Quincy Simmonds 4/14 and was advised to stop then.  She still had several pills left and she stretched them out as long as she could so she reports she has been off HRT around six months.  Was only taking about twice weekly and still felt good with this dosage.  Once fully off for several weeks, she began to have hot flashes again, which she feels she could tolerate, but she is having increased issues with feeling mentally unclear as well as fatigue that she contributes to the absence of HRT.  She would really like to restart it.  D/w pt risks including DVT/PE, MI and stroke, as well as increased risk for breast cancer.  Pt states she is absolutely willing to accept this risks if she will start to feel better.  Reviewed her hx since I last saw her and she has not had any new cardiac or neuro symptoms. EMR records fully reviewed. Understands how to take them.  Recognizes that I will want to taper her down to the lowest dose possible and will want her to wean off in her 28s.    Did a 3D MMG 09/12/13 which was normal.    Review of Systems     Objective:   Physical Exam  Constitutional: She is oriented to person, place, and time. She appears well-developed and well-nourished.  Neurological: She is alert and oriented to person, place, and time.       Assessment:     Menopausal symptoms due to absence of HRT     Plan:     Estratest, generic is fine.  1/2 tab two to three times weekly.  Pt will give me an update in 1 month.  ~15 minutes spent with patient >50% of time was in face to face discussion of above.

## 2014-04-11 ENCOUNTER — Encounter: Payer: Self-pay | Admitting: Obstetrics & Gynecology

## 2014-05-22 ENCOUNTER — Telehealth: Payer: Self-pay

## 2014-05-22 NOTE — Telephone Encounter (Signed)
Message copied by Robley Fries on Tue May 22, 2014  3:31 PM ------      Message from: Megan Salon      Created: Fri May 18, 2014  5:33 AM      Regarding: check on pt after restarting HRT       Claiborne Billings,      Can you check on pt.  She restarted HRT about six weeks ago.  Just wanting to see how she is doing .  Thanks.            MSM ------

## 2014-05-22 NOTE — Telephone Encounter (Signed)
Lmtcb//kn 

## 2014-05-25 NOTE — Telephone Encounter (Signed)
Pt states she is doing fine. She states she actually can feel a difference. Routed to provider for review

## 2014-06-25 ENCOUNTER — Encounter: Payer: Self-pay | Admitting: Family Medicine

## 2014-06-25 ENCOUNTER — Ambulatory Visit (INDEPENDENT_AMBULATORY_CARE_PROVIDER_SITE_OTHER)
Admission: RE | Admit: 2014-06-25 | Discharge: 2014-06-25 | Disposition: A | Discharge status: Home / Self Care | Payer: Medicare Other | Source: Ambulatory Visit | Attending: Family Medicine | Admitting: Family Medicine

## 2014-06-25 ENCOUNTER — Ambulatory Visit (INDEPENDENT_AMBULATORY_CARE_PROVIDER_SITE_OTHER): Payer: Medicare Other | Admitting: Family Medicine

## 2014-06-25 VITALS — BP 110/78 | Temp 98.5°F | Ht 60.5 in | Wt 116.0 lb

## 2014-06-25 DIAGNOSIS — Z23 Encounter for immunization: Secondary | ICD-10-CM

## 2014-06-25 DIAGNOSIS — M79605 Pain in left leg: Secondary | ICD-10-CM | POA: Insufficient documentation

## 2014-06-25 DIAGNOSIS — M545 Low back pain, unspecified: Secondary | ICD-10-CM | POA: Insufficient documentation

## 2014-06-25 DIAGNOSIS — F32A Depression, unspecified: Secondary | ICD-10-CM

## 2014-06-25 DIAGNOSIS — D510 Vitamin B12 deficiency anemia due to intrinsic factor deficiency: Secondary | ICD-10-CM

## 2014-06-25 DIAGNOSIS — G4700 Insomnia, unspecified: Secondary | ICD-10-CM

## 2014-06-25 DIAGNOSIS — B0089 Other herpesviral infection: Secondary | ICD-10-CM

## 2014-06-25 DIAGNOSIS — G8929 Other chronic pain: Secondary | ICD-10-CM | POA: Insufficient documentation

## 2014-06-25 DIAGNOSIS — E038 Other specified hypothyroidism: Secondary | ICD-10-CM

## 2014-06-25 DIAGNOSIS — F329 Major depressive disorder, single episode, unspecified: Secondary | ICD-10-CM

## 2014-06-25 LAB — CBC WITH DIFFERENTIAL/PLATELET
Basophils Absolute: 0 10*3/uL (ref 0.0–0.1)
Basophils Relative: 0.3 % (ref 0.0–3.0)
Eosinophils Absolute: 0.3 10*3/uL (ref 0.0–0.7)
Eosinophils Relative: 3.9 % (ref 0.0–5.0)
HCT: 41.1 % (ref 36.0–46.0)
Hemoglobin: 13.9 g/dL (ref 12.0–15.0)
Lymphocytes Relative: 15.7 % (ref 12.0–46.0)
Lymphs Abs: 1.3 10*3/uL (ref 0.7–4.0)
MCHC: 33.8 g/dL (ref 30.0–36.0)
MCV: 92.9 fl (ref 78.0–100.0)
Monocytes Absolute: 0.7 10*3/uL (ref 0.1–1.0)
Monocytes Relative: 8.6 % (ref 3.0–12.0)
Neutro Abs: 5.7 10*3/uL (ref 1.4–7.7)
Neutrophils Relative %: 71.5 % (ref 43.0–77.0)
Platelets: 274 10*3/uL (ref 150.0–400.0)
RBC: 4.42 Mil/uL (ref 3.87–5.11)
RDW: 13.1 % (ref 11.5–15.5)
WBC: 8 10*3/uL (ref 4.0–10.5)

## 2014-06-25 LAB — LIPID PANEL
Cholesterol: 184 mg/dL (ref 0–200)
HDL: 78.8 mg/dL (ref >39.00)
LDL Cholesterol: 98 mg/dL (ref 0–99)
NonHDL: 105.2
Total CHOL/HDL Ratio: 2
Triglycerides: 38 mg/dL (ref 0.0–149.0)
VLDL: 7.6 mg/dL (ref 0.0–40.0)

## 2014-06-25 LAB — HEPATIC FUNCTION PANEL
ALT: 16 U/L (ref 0–35)
AST: 26 U/L (ref 0–37)
Albumin: 3.7 g/dL (ref 3.5–5.2)
Alkaline Phosphatase: 67 U/L (ref 39–117)
Bilirubin, Direct: 0 mg/dL (ref 0.0–0.3)
Total Bilirubin: 0.6 mg/dL (ref 0.2–1.2)
Total Protein: 7.1 g/dL (ref 6.0–8.3)

## 2014-06-25 LAB — BASIC METABOLIC PANEL
BUN: 14 mg/dL (ref 6–23)
CO2: 22 mEq/L (ref 19–32)
Calcium: 9 mg/dL (ref 8.4–10.5)
Chloride: 102 mEq/L (ref 96–112)
Creatinine, Ser: 0.9 mg/dL (ref 0.4–1.2)
GFR: 71.05 mL/min (ref >60.00)
Glucose, Bld: 75 mg/dL (ref 70–99)
Potassium: 4.6 mEq/L (ref 3.5–5.1)
Sodium: 136 mEq/L (ref 135–145)

## 2014-06-25 LAB — TSH: TSH: 0.4 u[IU]/mL (ref 0.35–4.50)

## 2014-06-25 MED ORDER — SERTRALINE HCL 100 MG PO TABS: 100.0000 mg | ORAL_TABLET | Freq: Every day | ORAL | Status: DC

## 2014-06-25 MED ORDER — TRAMADOL HCL 50 MG PO TABS: ORAL_TABLET | ORAL | Status: DC

## 2014-06-25 MED ORDER — ACYCLOVIR 400 MG PO TABS: ORAL_TABLET | ORAL | Status: DC

## 2014-06-25 MED ORDER — TRAZODONE HCL 150 MG PO TABS: 150.0000 mg | ORAL_TABLET | Freq: Every day | ORAL | Status: DC

## 2014-06-25 MED ORDER — LEVOTHYROXINE SODIUM 75 MCG PO TABS: 75.0000 ug | ORAL_TABLET | Freq: Every day | ORAL | Status: DC

## 2014-06-25 MED ORDER — CYANOCOBALAMIN 1000 MCG/ML IJ SOLN: 1000.0000 ug | INTRAMUSCULAR | Status: DC

## 2014-06-25 NOTE — Patient Instructions (Signed)
Motrin 400 mg twice daily with food  Tramadol 50 mg......... One half tab twice daily  X-rays of your spine  We will get you set up for a neurosurgical consult for further evaluation of your chronic and worsening back pain  Continue other medications  Follow-up in 1 year sooner if any problems

## 2014-06-25 NOTE — Progress Notes (Signed)
Pre visit review using our clinic review tool, if applicable. No additional management support is needed unless otherwise documented below in the visit note. 

## 2014-06-25 NOTE — Progress Notes (Signed)
Subjective:    Patient ID: Jo Gomez, female    DOB: 11-16-1945, 68 y.o.   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 She was given Fosamax by her GYN. She had a uterus and ovaries removed many years ago for nonmalignant reasons.  She gets routine eye care, dental care, BSE monthly, and you mammography, colonoscopy every 5 years  Vaccinations updated by Apolonio Schneiders  A new problem is low back pain. She says is been bothering her for many years just gotten worse recently. She does aerobics 3 times a week and she walks a couple miles a day. She also does a lot of hiking. No history of trauma. She describes the pain is mid low back shooting down her left leg posteriorly to the calf. It's worse in the morning. She's not taking any medication for it. She's not seen anybody about it.   Review of Systems  Constitutional: Negative.   HENT: Negative.   Eyes: Negative.   Respiratory: Negative.   Cardiovascular: Negative.   Gastrointestinal: Negative.   Endocrine: Negative.   Genitourinary: Negative.   Musculoskeletal: Negative.   Skin: Negative.   Allergic/Immunologic: Negative.   Neurological: Negative.   Hematological: Negative.   Psychiatric/Behavioral: Negative.        Objective:   Physical Exam  Constitutional: She is oriented to person, place, and time. She appears well-developed and well-nourished.  HENT:  Head: Normocephalic and atraumatic.  Right Ear: External ear normal.  Left Ear: External ear normal.  Nose: Nose normal.  Mouth/Throat: Oropharynx is clear and moist.  Eyes: EOM are normal. Pupils are equal, round, and reactive to light.  Neck: Normal range of motion. Neck supple. No JVD present. No tracheal deviation present. No thyromegaly present.    Cardiovascular: Normal rate, regular rhythm, normal heart sounds and intact distal pulses.  Exam reveals no gallop and no friction rub.   No murmur heard. No carotid nor aortic bruits peripheral pulses 1+ and symmetrical  Pulmonary/Chest: Effort normal and breath sounds normal. No stridor. No respiratory distress. She has no wheezes. She has no rales. She exhibits no tenderness.  Abdominal: Soft. Bowel sounds are normal. She exhibits no distension and no mass. There is no tenderness. There is no rebound and no guarding.  Genitourinary:  Bilateral breast exam normal  Musculoskeletal: Normal range of motion. She exhibits no edema or tenderness.  Back exam shows no palpable tenderness. In the supine position both legs cervical length. Sensation strength and reflexes all normal  Lymphadenopathy:    She has no cervical adenopathy.  Neurological: She is alert and oriented to person, place, and time. She has normal reflexes. No cranial nerve deficit. She exhibits normal muscle tone. Coordination normal.  Skin: Skin is warm and dry. No rash noted. No erythema. No pallor.  Total body skin exam normal  Psychiatric: She has a normal mood and affect. Her behavior is normal. Judgment and thought content normal.  Nursing note and vitals reviewed.         Assessment & Plan:  Occasional HSV-1........Marland Kitchen Acyclovir when necessary  Vitamin B12 deficiency........ Continue vitamin B12 1 mL monthly Hypothyroidism continue Synthroid 75 g daily  History of mild depression continue Zoloft 100 mg daily  History of sleep dysfunction continue trazodone 150 mg daily  Osteoporosis treated by GYN with Fosamax and vitamin D  New problem of low back pain........... Sounds like degenerative disc disease L4-L5..........Marland Kitchen Motrin..... Tramadol daily at bedtime small doses...........Marland Kitchen X-ray lumbar spine........Marland Kitchen Refer to neurosurgery for evaluation

## 2014-06-26 ENCOUNTER — Other Ambulatory Visit: Payer: Self-pay | Admitting: Family Medicine

## 2014-06-26 DIAGNOSIS — M5440 Lumbago with sciatica, unspecified side: Secondary | ICD-10-CM

## 2014-06-28 ENCOUNTER — Ambulatory Visit: Payer: Medicare Other

## 2014-06-29 ENCOUNTER — Ambulatory Visit: Payer: Medicare Other | Attending: Family Medicine | Admitting: Physical Therapy

## 2014-06-29 DIAGNOSIS — M79669 Pain in unspecified lower leg: Secondary | ICD-10-CM | POA: Insufficient documentation

## 2014-06-29 DIAGNOSIS — M545 Low back pain: Secondary | ICD-10-CM | POA: Diagnosis not present

## 2014-06-29 DIAGNOSIS — M810 Age-related osteoporosis without current pathological fracture: Secondary | ICD-10-CM | POA: Insufficient documentation

## 2014-06-29 DIAGNOSIS — Z5189 Encounter for other specified aftercare: Secondary | ICD-10-CM | POA: Diagnosis not present

## 2014-06-29 DIAGNOSIS — E079 Disorder of thyroid, unspecified: Secondary | ICD-10-CM | POA: Diagnosis not present

## 2014-06-29 DIAGNOSIS — E538 Deficiency of other specified B group vitamins: Secondary | ICD-10-CM | POA: Insufficient documentation

## 2014-07-03 ENCOUNTER — Ambulatory Visit: Payer: Medicare Other | Admitting: Physical Therapy

## 2014-07-03 DIAGNOSIS — Z5189 Encounter for other specified aftercare: Secondary | ICD-10-CM | POA: Diagnosis not present

## 2014-07-06 ENCOUNTER — Ambulatory Visit: Payer: Medicare Other | Admitting: Physical Therapy

## 2014-07-06 DIAGNOSIS — Z5189 Encounter for other specified aftercare: Secondary | ICD-10-CM | POA: Diagnosis not present

## 2014-07-09 ENCOUNTER — Ambulatory Visit: Payer: Medicare Other | Admitting: Physical Therapy

## 2014-07-11 ENCOUNTER — Ambulatory Visit: Payer: Medicare Other | Admitting: Physical Therapy

## 2014-07-16 ENCOUNTER — Encounter: Payer: Medicare Other | Admitting: Physical Therapy

## 2014-07-23 ENCOUNTER — Encounter: Payer: Medicare Other | Admitting: Physical Therapy

## 2014-07-25 ENCOUNTER — Encounter: Payer: Medicare Other | Admitting: Physical Therapy

## 2014-07-30 ENCOUNTER — Ambulatory Visit: Payer: Medicare Other | Admitting: Physical Therapy

## 2014-08-01 ENCOUNTER — Ambulatory Visit: Payer: Medicare Other | Admitting: Physical Therapy

## 2014-08-21 ENCOUNTER — Encounter: Payer: Self-pay | Admitting: Family Medicine

## 2014-08-27 ENCOUNTER — Other Ambulatory Visit: Payer: Self-pay | Admitting: Family Medicine

## 2014-10-03 ENCOUNTER — Other Ambulatory Visit: Payer: Self-pay

## 2014-10-03 DIAGNOSIS — Z1231 Encounter for screening mammogram for malignant neoplasm of breast: Secondary | ICD-10-CM

## 2014-10-24 ENCOUNTER — Telehealth: Payer: Self-pay | Admitting: Gastroenterology

## 2014-10-24 ENCOUNTER — Ambulatory Visit: Payer: Self-pay

## 2014-10-24 NOTE — Telephone Encounter (Signed)
Patient was seeing Dr. Sharlett Iles.  Had previous COLON with Dr. Oletta Lamas at [facility] 09/2009 and was on a 5 year schedule.  As doc of day, can Dr. Fuller Plan review the records to approve direct colon?

## 2014-10-25 ENCOUNTER — Encounter: Payer: Self-pay | Admitting: Gastroenterology

## 2014-10-25 NOTE — Telephone Encounter (Signed)
I spoke with the patient and she does not have a family history of colon cancer, but both of her parents had polyps She is scheduled for a colon on 11/14/14 and pre-visit for 11/01/14 Her history is updated

## 2014-10-25 NOTE — Telephone Encounter (Signed)
Dr. Oletta Lamas 2011 colonoscopy indication: both parents with colon polyps  Dr. Oletta Lamas 2006 colonoscopy indication: strong family history of colon cancer  Please clarify her family history. If either is correct then she is due for a 5 year recall colonoscopy. This was Dr. Oletta Lamas recommendation as well.

## 2014-10-25 NOTE — Telephone Encounter (Signed)
Dr. Fuller Plan please review the colonoscopy report under procedure tab 10/02/2009 and advise if patient needs to have a colonoscopy.  Her last colon was normal, but she has a family history of colon polyps and per the [facility] report was for a 5 year recall.  She is a former patient of Dr. Sharlett Iles.

## 2014-11-01 ENCOUNTER — Ambulatory Visit
Admission: RE | Admit: 2014-11-01 | Discharge: 2014-11-01 | Disposition: A | Discharge status: Home / Self Care | Payer: Medicare Other | Source: Ambulatory Visit

## 2014-11-01 ENCOUNTER — Ambulatory Visit (AMBULATORY_SURGERY_CENTER): Payer: Self-pay | Admitting: *Deleted

## 2014-11-01 VITALS — Ht 60.0 in | Wt 113.6 lb

## 2014-11-01 DIAGNOSIS — Z1231 Encounter for screening mammogram for malignant neoplasm of breast: Secondary | ICD-10-CM

## 2014-11-01 DIAGNOSIS — Z8371 Family history of colonic polyps: Secondary | ICD-10-CM

## 2014-11-01 MED ORDER — MOVIPREP 100 G PO SOLR: ORAL | Status: DC

## 2014-11-01 NOTE — Progress Notes (Signed)
No egg or soy allergy  No anesthesia or intubation problems per pt  No diet medications taken  Registered in EMMI   

## 2014-11-14 ENCOUNTER — Encounter: Payer: Self-pay | Admitting: Gastroenterology

## 2014-11-20 ENCOUNTER — Other Ambulatory Visit: Payer: Self-pay | Admitting: Family Medicine

## 2014-11-28 ENCOUNTER — Encounter: Payer: Self-pay | Admitting: Gastroenterology

## 2014-11-29 ENCOUNTER — Ambulatory Visit (INDEPENDENT_AMBULATORY_CARE_PROVIDER_SITE_OTHER): Payer: Medicare Other | Admitting: Obstetrics & Gynecology

## 2014-11-29 ENCOUNTER — Encounter: Payer: Self-pay | Admitting: Obstetrics & Gynecology

## 2014-11-29 VITALS — BP 120/62 | HR 60 | Resp 20 | Ht 59.5 in | Wt 110.4 lb

## 2014-11-29 DIAGNOSIS — Z01419 Encounter for gynecological examination (general) (routine) without abnormal findings: Secondary | ICD-10-CM | POA: Diagnosis not present

## 2014-11-29 DIAGNOSIS — M810 Age-related osteoporosis without current pathological fracture: Secondary | ICD-10-CM

## 2014-11-29 DIAGNOSIS — E038 Other specified hypothyroidism: Secondary | ICD-10-CM | POA: Diagnosis not present

## 2014-11-29 MED ORDER — VITAMIN D (ERGOCALCIFEROL) 1.25 MG (50000 UNIT) PO CAPS: 50000.0000 [IU] | ORAL_CAPSULE | ORAL | Status: DC

## 2014-11-29 MED ORDER — EST ESTROGENS-METHYLTEST HS 0.625-1.25 MG PO TABS: 1.0000 | ORAL_TABLET | Freq: Every day | ORAL | Status: DC

## 2014-11-29 MED ORDER — ALENDRONATE SODIUM 10 MG PO TABS: 10.0000 mg | ORAL_TABLET | Freq: Every day | ORAL | Status: DC

## 2014-11-29 NOTE — Progress Notes (Signed)
69 y.o. G1P1 Significant OtherCaucasianF here for annual exam. Pt reports she is feeling really well.  Very happy to be back on HRT.  Feeling so much more "normal".  Very aware of risks and just does not want to be off HRT unless she "has to be".  No vaginal bleeding.   PCP:  Dr. Sherren Mocha.  Last appt was in the fall.  Labs were all normal.  Patient's last menstrual period was 08/24/1997.          Sexually active: Yes.    The current method of family planning is status post hysterectomy.    Exercising: Yes.    yoga, pilates, and hiking Smoker:  Former smoker in [place] Maintenance: Pap:  ? 2004 History of abnormal Pap:  no MMG:  11/01/14 3D-normal Colonoscopy:  2011-repeat in 5 years.  Has app next month. BMD:   09/14/12-on Fosamax TDaP:  2014 Screening Labs: PCP, Hb today: PCP, Urine today: PCP   reports that she has quit smoking. She has never used smokeless tobacco. She reports that she drinks about 6.0 oz of alcohol per week. She reports that she does not use illicit drugs.  Past Medical History  Diagnosis Date  . Osteoporosis   . Asthma   . Hiatal hernia   . Low vitamin B12 level   . Ovarian cyst     TAH/BSO  . Thyroid dysfunction   . Allergy   . Depression   . 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 IgA deficiency     Past Surgical History  Procedure Laterality Date  . Broken arm      surgery for this  . Total abdominal hysterectomy w/ bilateral salpingoophorectomy  1999  . Thyroidectomy  2000  . Brow lift  1/15  . Colonoscopy      Current Outpatient Prescriptions  Medication Sig Dispense Refill  . acyclovir (ZOVIRAX) 400 MG tablet One tablet 4 times daily when necessary 50 tablet 3  . Alendronate Sodium (FOSAMAX PO) Take by mouth daily.    . cyanocobalamin (,VITAMIN B-12,) 1000 MCG/ML injection Inject 1 mL (1,000 mcg total) into the skin every 30 (thirty) days. 30 mL 1  . EST ESTROGENS-METHYLTEST HS 0.625-1.25 MG per tablet Using 1/3-1/2 Mon, Weds,  Friday    . levothyroxine (SYNTHROID, LEVOTHROID) 75 MCG tablet Take 1 tablet (75 mcg total) by mouth daily. 100 tablet 3  . MOVIPREP 100 G SOLR Moviprep as directed, no substitutions 1 kit 0  . sertraline (ZOLOFT) 100 MG tablet Take 1 tablet (100 mg total) by mouth daily. 100 tablet 3  . traZODone (DESYREL) 150 MG tablet Take 1 tablet (150 mg total) by mouth at bedtime. 100 tablet 3  . TUBERCULIN SYR 1CC/25GX5/8" (B-D TB SYRINGE 1CC/25GX5/8") 25G X 5/8" 1 ML MISC USE AS DIRECTED FOR (B-12) INJECTIONS 20 each 1  . Vitamin D, Ergocalciferol, (DRISDOL) 50000 UNITS CAPS capsule TAKE ONE CAPSULE BY MOUTH ONCE A MONTH 30 capsule 5   No current facility-administered medications for this visit.    Family History  Problem Relation Age of Onset  . COPD Father     chronic lung infections  . Colon polyps Father   . Heart attack Mother   . Osteoporosis Mother   . Breast cancer Mother 39  . Lung cancer Mother   . Colon polyps Mother   . Colon cancer Neg Hx   . Esophageal cancer Neg Hx   . Stomach cancer Neg  Hx   . Rectal cancer Neg Hx     ROS:  Pertinent items are noted in HPI.  Otherwise, a comprehensive ROS was negative.  Exam:   General appearance: alert, cooperative and appears stated age Head: Normocephalic, without obvious abnormality, atraumatic Neck: no adenopathy, supple, symmetrical, trachea midline and thyroid normal to inspection and palpation Lungs: clear to auscultation bilaterally Breasts: normal appearance, no masses or tenderness Heart: regular rate and rhythm Abdomen: soft, non-tender; bowel sounds normal; no masses,  no organomegaly Extremities: extremities normal, atraumatic, no cyanosis or edema Skin: Skin color, texture, turgor normal. No rashes or lesions Lymph nodes: Cervical, supraclavicular, and axillary nodes normal. No abnormal inguinal nodes palpated Neurologic: Grossly normal   Pelvic: External genitalia:  no lesions              Urethra:  normal  appearing urethra with no masses, tenderness or lesions              Bartholins and Skenes: normal                 Vagina: normal appearing vagina with normal color and discharge, no lesions              Cervix: absent              Pap taken: No. Bimanual Exam:  Uterus:  uterus absent              Adnexa: no mass, fullness, tenderness               Rectovaginal: Confirms               Anus:  normal sphincter tone, no lesions  Chaperone was present for exam.  A:  Well Woman with normal exam H/O TAH/BSO Osteopenia Decreased libido  P: Mammogram yearly BMD with MMG 2016.  Will consider if can come off Fosamax On Fosamax 62m daily. #90/4RF pap smear not indicated Estratest HS.  Pt is taking a very small amount and is trying to take 1/2 to 1/3 tabs every two to three days.  Doing very well with this.  Risks of breast cancer, stroke, MI, DVT/PE reviewed.  Still desires to continue. return annually or prn

## 2015-01-01 ENCOUNTER — Telehealth: Payer: Self-pay | Admitting: Gastroenterology

## 2015-01-01 NOTE — Telephone Encounter (Signed)
Patient reports sinus infection and fever.  T max 101.1 last night.  I have cancelled her procedure.  She needs to find a ride, so wants to call back to reschedule when she can speak with her ride.

## 2015-01-02 ENCOUNTER — Encounter: Payer: Self-pay | Admitting: Gastroenterology

## 2015-01-07 ENCOUNTER — Other Ambulatory Visit: Payer: Self-pay | Admitting: Obstetrics & Gynecology

## 2015-01-14 ENCOUNTER — Encounter: Payer: Self-pay | Admitting: Family Medicine

## 2015-01-14 ENCOUNTER — Ambulatory Visit (INDEPENDENT_AMBULATORY_CARE_PROVIDER_SITE_OTHER): Payer: Medicare Other | Admitting: Family Medicine

## 2015-01-14 VITALS — BP 120/80 | Temp 98.7°F | Wt 109.0 lb

## 2015-01-14 DIAGNOSIS — H6123 Impacted cerumen, bilateral: Secondary | ICD-10-CM

## 2015-01-14 DIAGNOSIS — H6120 Impacted cerumen, unspecified ear: Secondary | ICD-10-CM | POA: Diagnosis not present

## 2015-01-14 DIAGNOSIS — H918X9 Other specified hearing loss, unspecified ear: Secondary | ICD-10-CM | POA: Diagnosis not present

## 2015-01-14 NOTE — Progress Notes (Signed)
   Subjective:    Patient ID: Jo Gomez, female    DOB: [birth year], 69 y.o.   MRN: [id number]  HPI Jo Gomez is a 69 year old single female nonsmoker who comes in today for evaluation of hearing loss  She's notices the last couple weeks her hearing is diminished. She's had trouble with earwax in the past. She therefore tried earwax kit but it didn't work.   Review of Systems    review of systems otherwise negative Objective:   Physical Exam  Well-developed well-nourished female no acute distress vital signs stable she's afebrile HEENT were negative except for bilateral cerumen impactions which relieved by irrigation      Assessment & Plan:  Bilateral show impactions relieved by irrigation

## 2015-01-14 NOTE — Patient Instructions (Signed)
Set up a time the first week in November to see Jo Gomez for your annual physical examination  Rachel's extension is [phone]

## 2015-01-14 NOTE — Progress Notes (Signed)
Pre visit review using our clinic review tool, if applicable. No additional management support is needed unless otherwise documented below in the visit note. 

## 2015-01-16 ENCOUNTER — Encounter: Payer: Self-pay | Admitting: Family Medicine

## 2015-01-22 MED ORDER — CIPROFLOXACIN HCL 500 MG PO TABS: 500.0000 mg | ORAL_TABLET | Freq: Two times a day (BID) | ORAL | Status: DC

## 2015-04-15 ENCOUNTER — Ambulatory Visit (INDEPENDENT_AMBULATORY_CARE_PROVIDER_SITE_OTHER): Payer: Medicare Other | Admitting: Family Medicine

## 2015-04-15 VITALS — BP 122/80 | HR 71 | Temp 98.6°F | Resp 16 | Ht 60.0 in | Wt 109.6 lb

## 2015-04-15 DIAGNOSIS — S61209A Unspecified open wound of unspecified finger without damage to nail, initial encounter: Secondary | ICD-10-CM

## 2015-04-15 DIAGNOSIS — S61201A Unspecified open wound of left index finger without damage to nail, initial encounter: Secondary | ICD-10-CM

## 2015-04-15 DIAGNOSIS — S81801A Unspecified open wound, right lower leg, initial encounter: Secondary | ICD-10-CM

## 2015-04-15 DIAGNOSIS — S91001A Unspecified open wound, right ankle, initial encounter: Secondary | ICD-10-CM

## 2015-04-15 DIAGNOSIS — S81001A Unspecified open wound, right knee, initial encounter: Secondary | ICD-10-CM

## 2015-04-15 NOTE — Progress Notes (Signed)
Verbal consent obtained from patient.  Local anesthesia with 2cc 1% plain lido right knee, 2cc 1% plain lido digital block left ring finger.  Wound scrubbed with soap and water and rinsed.  Wound closed with #8 5-0 simple interrupted sutures right knee and #2 5-0 simple interrupted sutures left ring finger.  Wound cleansed and dressed.

## 2015-04-15 NOTE — Patient Instructions (Addendum)
Keep wounds clean and dry  Return in 7-10 days for sutures to be removed  Laceration Care, Adult A laceration is a cut or lesion that goes through all layers of the skin and into the tissue just beneath the skin. TREATMENT  Some lacerations may not require closure. Some lacerations may not be able to be closed due to an increased risk of infection. It is important to see your caregiver as soon as possible after an injury to minimize the risk of infection and maximize the opportunity for successful closure. If closure is appropriate, pain medicines may be given, if needed. The wound will be cleaned to help prevent infection. Your caregiver will use stitches (sutures), staples, wound glue (adhesive), or skin adhesive strips to repair the laceration. These tools bring the skin edges together to allow for faster healing and a better cosmetic outcome. However, all wounds will heal with a scar. Once the wound has healed, scarring can be minimized by covering the wound with sunscreen during the day for 1 full year. HOME CARE INSTRUCTIONS  For sutures or staples:  Keep the wound clean and dry.  If you were given a bandage (dressing), you should change it at least once a day. Also, change the dressing if it becomes wet or dirty, or as directed by your caregiver.  Wash the wound with soap and water 2 times a day. Rinse the wound off with water to remove all soap. Pat the wound dry with a clean towel.  After cleaning, apply a thin layer of the antibiotic ointment as recommended by your caregiver. This will help prevent infection and keep the dressing from sticking.  You may shower as usual after the first 24 hours. Do not soak the wound in water until the sutures are removed.  Only take over-the-counter or prescription medicines for pain, discomfort, or fever as directed by your caregiver.  Get your sutures or staples removed as directed by your caregiver. For skin adhesive strips:  Keep the wound  clean and dry.  Do not get the skin adhesive strips wet. You may bathe carefully, using caution to keep the wound dry.  If the wound gets wet, pat it dry with a clean towel.  Skin adhesive strips will fall off on their own. You may trim the strips as the wound heals. Do not remove skin adhesive strips that are still stuck to the wound. They will fall off in time. For wound adhesive:  You may briefly wet your wound in the shower or bath. Do not soak or scrub the wound. Do not swim. Avoid periods of heavy perspiration until the skin adhesive has fallen off on its own. After showering or bathing, gently pat the wound dry with a clean towel.  Do not apply liquid medicine, cream medicine, or ointment medicine to your wound while the skin adhesive is in place. This may loosen the film before your wound is healed.  If a dressing is placed over the wound, be careful not to apply tape directly over the skin adhesive. This may cause the adhesive to be pulled off before the wound is healed.  Avoid prolonged exposure to sunlight or tanning lamps while the skin adhesive is in place. Exposure to ultraviolet light in the first year will darken the scar.  The skin adhesive will usually remain in place for 5 to 10 days, then naturally fall off the skin. Do not pick at the adhesive film. You may need a tetanus shot if:  You  cannot remember when you had your last tetanus shot.  You have never had a tetanus shot. If you get a tetanus shot, your arm may swell, get red, and feel warm to the touch. This is common and not a problem. If you need a tetanus shot and you choose not to have one, there is a rare chance of getting tetanus. Sickness from tetanus can be serious. SEEK MEDICAL CARE IF:   You have redness, swelling, or increasing pain in the wound.  You see a red line that goes away from the wound.  You have yellowish-white fluid (pus) coming from the wound.  You have a fever.  You notice a bad smell  coming from the wound or dressing.  Your wound breaks open before or after sutures have been removed.  You notice something coming out of the wound such as wood or glass.  Your wound is on your hand or foot and you cannot move a finger or toe. SEEK IMMEDIATE MEDICAL CARE IF:   Your pain is not controlled with prescribed medicine.  You have severe swelling around the wound causing pain and numbness or a change in color in your arm, hand, leg, or foot.  Your wound splits open and starts bleeding.  You have worsening numbness, weakness, or loss of function of any joint around or beyond the wound.  You develop painful lumps near the wound or on the skin anywhere on your body. MAKE SURE YOU:   Understand these instructions.  Will watch your condition.  Will get help right away if you are not doing well or get worse. Document Released: 08/10/2005 Document Revised: 11/02/2011 Document Reviewed: 02/03/2011 [facility] Patient Information 2015 [place], [place]. This information is not intended to replace advice given to you by your health care provider. Make sure you discuss any questions you have with your health care provider.

## 2015-04-15 NOTE — Progress Notes (Signed)
  Subjective:  Patient ID: Jo Gomez, female    DOB: 1946-01-24  Age: 69 y.o. MRN: [id number]  69 year old healthy lady who was working in her yard, tripped over a pipe and injured her right knee and left fourth finger. Tetanus immunization is up-to-date. She doesn't think she is ever had stitches. Knows of no lidocaine allergy.   Objective:   2.2 cm laceration just below the right knee, slightly lateral to the tibial tuberosity area. It is through the skin, but does not appear to go very deep. Her left ring finger has a small semicircle flap, less than 1 cm, which opens up easily.  Assessment & Plan:   Assessment:  Laceration right knee Laceration left index finger  Plan:  Suture repair. No tetanus vaccine is needed.  There are no Patient Instructions on file for this visit.   Arah Aro, MD 04/15/2015

## 2015-06-22 ENCOUNTER — Other Ambulatory Visit: Payer: Self-pay | Admitting: Family Medicine

## 2015-07-14 ENCOUNTER — Other Ambulatory Visit: Payer: Self-pay | Admitting: Family Medicine

## 2015-08-03 ENCOUNTER — Ambulatory Visit (INDEPENDENT_AMBULATORY_CARE_PROVIDER_SITE_OTHER): Payer: Medicare Other | Admitting: Family Medicine

## 2015-08-03 ENCOUNTER — Encounter: Payer: Self-pay | Admitting: Family Medicine

## 2015-08-03 VITALS — BP 128/64 | HR 68 | Temp 98.0°F | Wt 110.0 lb

## 2015-08-03 DIAGNOSIS — J209 Acute bronchitis, unspecified: Secondary | ICD-10-CM

## 2015-08-03 MED ORDER — AMOXICILLIN-POT CLAVULANATE 875-125 MG PO TABS: 1.0000 | ORAL_TABLET | Freq: Two times a day (BID) | ORAL | Status: DC

## 2015-08-03 NOTE — Assessment & Plan Note (Signed)
Patient's symptoms most consistent with bronchitis. Doubt pneumonia given no focal lung exam findings. Patient's oxygenation and vital signs are stable today. Given her prior history of this and response to Augmentin or amoxicillin we will treat with Augmentin twice daily for 14 days. Did discuss obtaining a chest x-ray to evaluate her lungs, though she decided to hold off on this at this time. She will monitor her symptoms. She's given return precautions.

## 2015-08-03 NOTE — Progress Notes (Signed)
Pre visit review using our clinic review tool, if applicable. No additional management support is needed unless otherwise documented below in the visit note. 

## 2015-08-03 NOTE — Patient Instructions (Signed)
Nice to meet you. You likely have bronchitis. We will treat you with augmentin. If you develop chest pain, shortness of breath, fever, chills, or any new or change in symptoms please seek medical attention.

## 2015-08-03 NOTE — Progress Notes (Signed)
  Tommi Rumps, MD Phone: [phone]  Jo Gomez is a 69 y.o. female who presents today for a same day visit.  Patient notes 1 week of productive cough. She notes greenish mucus. She states she feels bronchial irritation. She denies chest pain/pressure and shortness of breath. She notes this started out with throat irritation, then changed to some sinus fullness and then now chest drainage and production. She does note a temperature of 100.71F several days ago. She has history of recurrent issue similar to this in the past. 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 She usually responds well to these medicines. She notes her partner was sick with similar symptoms.  PMH: Former smoker.   ROS see history of present illness  Objective  Physical Exam Filed Vitals:   08/03/15 0951  BP: 128/64  Pulse: 68  Temp: 98 F (36.7 C)   Physical Exam  Constitutional: She is well-developed, well-nourished, and in no distress.  HENT:  Head: Normocephalic and atraumatic.  Right Ear: External ear normal.  Mouth/Throat: Oropharynx is clear and moist. No oropharyngeal exudate.  Eyes: Conjunctivae are normal. Pupils are equal, round, and reactive to light.  Neck: Neck supple.  Cardiovascular: Normal rate, regular rhythm and normal heart sounds.   Pulmonary/Chest: Effort normal. No respiratory distress. She has no wheezes. She has no rales.  Mildly coarse breath sounds throughout  Lymphadenopathy:    She has no cervical adenopathy.  Neurological: She is alert. Gait normal.  Skin: Skin is warm and dry. She is not diaphoretic.     Assessment/Plan: Please see individual problem list.  Acute bronchitis Patient's symptoms most consistent with bronchitis. Doubt pneumonia given no focal lung exam findings. Patient's oxygenation and vital signs are stable today. Given her prior history of this and response to Augmentin or amoxicillin we will  treat with Augmentin twice daily for 14 days. Did discuss obtaining a chest x-ray to evaluate her lungs, though she decided to hold off on this at this time. She will monitor her symptoms. She's given return precautions.    Meds ordered this encounter  Medications  . amoxicillin-clavulanate (AUGMENTIN) 875-125 MG tablet    Sig: Take 1 tablet by mouth 2 (two) times daily.    Dispense:  28 tablet    Refill:  0   Dragon voice recognition software was used during the dictation process of this note. If any phrases or words seem inappropriate it is likely secondary to the translation process being inefficient.  Tommi Rumps

## 2015-08-18 ENCOUNTER — Other Ambulatory Visit: Payer: Self-pay | Admitting: Obstetrics & Gynecology

## 2015-08-18 ENCOUNTER — Other Ambulatory Visit: Payer: Self-pay | Admitting: Family Medicine

## 2015-08-20 NOTE — Telephone Encounter (Deleted)
Medication refill request: Est Estrogens-Methyltest HS Last AEX:  11/29/2014 SM Next AEX: 02/14/2015 SM Last MMG (if hormonal medication request): 11/01/2014 BIRADS Category 1 Negative Refill authorized: 11/29/2014 Est Estrogens-Meythyltest HS #30 tabs 12 Refills  Today:

## 2015-08-21 ENCOUNTER — Telehealth: Payer: Self-pay | Admitting: Obstetrics & Gynecology

## 2015-08-22 NOTE — Telephone Encounter (Signed)
Refill request denied: Refill request too soon.  Last refill written on 11/29/2014 for 30 tabs and 12 refills.  Should still have enough until 04/17.

## 2015-08-24 ENCOUNTER — Ambulatory Visit (INDEPENDENT_AMBULATORY_CARE_PROVIDER_SITE_OTHER): Payer: Medicare Other | Admitting: Internal Medicine

## 2015-08-24 ENCOUNTER — Other Ambulatory Visit: Payer: Self-pay | Admitting: Obstetrics & Gynecology

## 2015-08-24 ENCOUNTER — Encounter: Payer: Self-pay | Admitting: Internal Medicine

## 2015-08-24 VITALS — BP 114/70 | HR 76 | Temp 98.9°F | Ht 60.0 in | Wt 110.0 lb

## 2015-08-24 DIAGNOSIS — J019 Acute sinusitis, unspecified: Secondary | ICD-10-CM

## 2015-08-24 MED ORDER — AMOXICILLIN 875 MG PO TABS: 875.0000 mg | ORAL_TABLET | Freq: Two times a day (BID) | ORAL | Status: DC

## 2015-08-24 NOTE — Progress Notes (Signed)
HPI  Pt presents to the clinic today with c/o headache, fatigue, nasal congestion, cough and chest congestion. This started 1 week ago. She is blowing bloody mucous out of her nose. The cough is non productive. She does feel like she has been minimally short of breath. She denies fever but has felt fatigue and had body aches. She does have a history of seasonal allergies and asthma. She was seen 3 weeks ago for the same. She was on Augmentin for 2 weeks. She did feel like she got better but then her symptoms came back. She has had sick contacts.  Review of Systems    Past Medical History  Diagnosis Date  . Osteoporosis   . Asthma   . Hiatal hernia   . Low vitamin B12 level   . Ovarian cyst     TAH/BSO  . Thyroid dysfunction   . Allergy   . Depression   . 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 IgA deficiency ([place])     Family History  Problem Relation Age of Onset  . COPD Father     chronic lung infections  . Colon polyps Father   . Heart attack Mother   . Osteoporosis Mother   . Breast cancer Mother 35  . Lung cancer Mother   . Colon polyps Mother   . Colon cancer Neg Hx   . Esophageal cancer Neg Hx   . Stomach cancer Neg Hx   . Rectal cancer Neg Hx     Social History   Social History  . Marital Status: Significant Other    Spouse Name: N/A  . Number of Children: N/A  . Years of Education: N/A   Occupational History  . Not on file.   Social History Main Topics  . Smoking status: Former Research scientist (life sciences)  . Smokeless tobacco: Never Used     Comment: in college  . Alcohol Use: 6.0 oz/week    10 Standard drinks or equivalent per week     Comment: glass of wine  . Drug Use: No  . Sexual Activity:    Partners: Female    Patent examiner Protection: Surgical     Comment: TAH/BSO   Other Topics Concern  . Not on file   Social History Narrative    Allergies  Allergen Reactions  . Clarithromycin     Severe leg and muscle cramps  . Codeine Rash  .  Sulfonamide Derivatives Rash  . Tetracycline Rash     Constitutional: Positive headache, fatigue. Denies fever or abrupt weight changes.  HEENT:  Positive eye pain, facial pain, nasal congestion and sore throat. Denies eye redness, ear pain, ringing in the ears, wax buildup, runny nose or bloody nose. Respiratory: Positive cough or shortness of breath. Denies difficulty breathing.  Cardiovascular: Denies chest pain, chest tightness, palpitations or swelling in the hands or feet.   No other specific complaints in a complete review of systems (except as listed in HPI above).  Objective:  BP 114/70 mmHg  Pulse 76  Temp(Src) 98.9 F (37.2 C) (Oral)  Ht 5' (1.524 m)  Wt 110 lb (49.896 kg)  BMI 21.48 kg/m2  SpO2 97%  LMP 08/24/1997   General: Appears her stated age, well developed, well nourished in NAD. HEENT: Head: normal shape and size, maxillary sinus tenderness noted; Eyes: sclera white, no icterus, conjunctiva pink; Ears: Tm's gray and intact, normal light reflex; Nose: mucosa boggy and moist, septum midline; Throat/Mouth: + PND. Teeth  present, mucosa erythematous and moist, no exudate noted, no lesions or ulcerations noted.  Neck:  No adenopathy noted.  Cardiovascular: Normal rate and rhythm. S1,S2 noted.  No murmur, rubs or gallops noted.  Pulmonary/Chest: Normal effort and positive vesicular breath sounds. No respiratory distress. No wheezes, rales or ronchi noted.      Assessment & Plan:   Acute bacterial sinusitis  Can use a Neti Pot which can be purchased from your local drug store. Flonase 2 sprays each nostril for 3 days and then as needed. Amoxil BID for 14 days (she refused Doxycycline) She has Prednisone at home and asks me if I think she should take it, I advised her not to take it.  RTC as needed or if symptoms persist.

## 2015-08-24 NOTE — Patient Instructions (Signed)

## 2015-08-24 NOTE — Progress Notes (Signed)
Pre visit review using our clinic review tool, if applicable. No additional management support is needed unless otherwise documented below in the visit note. 

## 2015-08-27 NOTE — Telephone Encounter (Signed)
Yes it is ok to let pharmacy know they can dispense this early for the pt.

## 2015-08-27 NOTE — Telephone Encounter (Signed)
Refused: Rx rf too soon.  Last rx on 11/29/14 for 30 tabs 12 refills.  Patient should have enough refills.  Pharmacy still the same

## 2015-08-27 NOTE — Telephone Encounter (Signed)
Medication refill request: estrogen methyltest  Last AEX:  11/29/14 SM Next AEX: 02/14/16 SM Last MMG (if hormonal medication request): 11/02/14 [id number] Refill authorized: 11/29/14 #30tabs/ 12R.   Patient requesting early refill. Ok to approve early refill to pharmacy?

## 2015-08-27 NOTE — Telephone Encounter (Signed)
Patient said she misplaced her bottle while cleaning and probably threw it away. Her pharmacy is not letting her get a refill. Patient says she will need a new prescription sent in. [phone]

## 2015-08-27 NOTE — Telephone Encounter (Signed)
Called pharmacy. They did not have any more refills left due to being a controlled substance, last Rx from 11/2014 only allowed 5 refills, not 12.  Called in Rx same dose and directions. #30tabs/4 refills to last until 11/2015. Patient is not early for refill  Routed to Dr. Sabra Heck for review Encounter closed.

## 2015-09-11 ENCOUNTER — Other Ambulatory Visit: Payer: Self-pay | Admitting: Orthopedic Surgery

## 2015-09-13 ENCOUNTER — Other Ambulatory Visit: Payer: Self-pay | Admitting: Family Medicine

## 2015-09-16 ENCOUNTER — Ambulatory Visit (INDEPENDENT_AMBULATORY_CARE_PROVIDER_SITE_OTHER): Payer: Medicare Other | Admitting: Internal Medicine

## 2015-09-16 ENCOUNTER — Encounter: Payer: Self-pay | Admitting: Internal Medicine

## 2015-09-16 VITALS — BP 148/78 | HR 62 | Temp 97.8°F | Resp 16 | Ht 60.0 in | Wt 111.0 lb

## 2015-09-16 DIAGNOSIS — F32A Depression, unspecified: Secondary | ICD-10-CM

## 2015-09-16 DIAGNOSIS — E538 Deficiency of other specified B group vitamins: Secondary | ICD-10-CM

## 2015-09-16 DIAGNOSIS — G4700 Insomnia, unspecified: Secondary | ICD-10-CM

## 2015-09-16 DIAGNOSIS — E890 Postprocedural hypothyroidism: Secondary | ICD-10-CM

## 2015-09-16 DIAGNOSIS — Z0000 Encounter for general adult medical examination without abnormal findings: Secondary | ICD-10-CM

## 2015-09-16 DIAGNOSIS — M8580 Other specified disorders of bone density and structure, unspecified site: Secondary | ICD-10-CM

## 2015-09-16 DIAGNOSIS — F329 Major depressive disorder, single episode, unspecified: Secondary | ICD-10-CM

## 2015-09-16 DIAGNOSIS — D802 Selective deficiency of immunoglobulin A [IgA]: Secondary | ICD-10-CM

## 2015-09-16 MED ORDER — "TUBERCULIN SYRINGE 25G X 5/8"" 1 ML MISC": Status: DC

## 2015-09-16 MED ORDER — TRAZODONE HCL 150 MG PO TABS: 150.0000 mg | ORAL_TABLET | Freq: Every day | ORAL | Status: DC

## 2015-09-16 MED ORDER — VITAMIN D (ERGOCALCIFEROL) 1.25 MG (50000 UNIT) PO CAPS: 50000.0000 [IU] | ORAL_CAPSULE | ORAL | Status: DC

## 2015-09-16 MED ORDER — CYANOCOBALAMIN 1000 MCG/ML IJ SOLN: INTRAMUSCULAR | Status: DC

## 2015-09-16 MED ORDER — SERTRALINE HCL 100 MG PO TABS: 100.0000 mg | ORAL_TABLET | Freq: Every day | ORAL | Status: DC

## 2015-09-16 MED ORDER — LEVOTHYROXINE SODIUM 75 MCG PO TABS: 75.0000 ug | ORAL_TABLET | Freq: Every day | ORAL | Status: DC

## 2015-09-16 NOTE — Assessment & Plan Note (Addendum)
Controlled with trazodone 150 mg nightly

## 2015-09-16 NOTE — Patient Instructions (Addendum)
We have reviewed your prior records including labs and tests today.  Test(s) ordered today. Your results will be released to [place] (or called to you) after review, usually within 72hours after test completion. If any changes need to be made, you will be notified at that same time.  All other Health Maintenance issues reviewed.   All recommended immunizations and age-appropriate screenings are up-to-date.  No immunizations administered today. EKG was done today.  Medications reviewed and updated.  No changes recommended at this time.  Your prescription(s) have been submitted to your pharmacy. Please take as directed and contact our office if you believe you are having problem(s) with the medication(s).   Please followup annually  Health Maintenance, Female Adopting a healthy lifestyle and getting preventive care can go a long way to promote health and wellness. Talk with your health care provider about what schedule of regular examinations is right for you. This is a good chance for you to check in with your provider about disease prevention and staying healthy. In between checkups, there are plenty of things you can do on your own. Experts have done a lot of research about which lifestyle changes and preventive measures are most likely to keep you healthy. Ask your health care provider for more information. WEIGHT AND DIET  Eat a healthy diet  Be sure to include plenty of vegetables, fruits, low-fat dairy products, and lean protein.  Do not eat a lot of foods high in solid fats, added sugars, or salt.  Get regular exercise. This is one of the most important things you can do for your health.  Most adults should exercise for at least 150 minutes each week. The exercise should increase your heart rate and make you sweat (moderate-intensity exercise).  Most adults should also do strengthening exercises at least twice a week. This is in addition to the moderate-intensity exercise.   Maintain a healthy weight  Body mass index (BMI) is a measurement that can be used to identify possible weight problems. It estimates body fat based on height and weight. Your health care provider can help determine your BMI and help you achieve or maintain a healthy weight.  For females 6 years of age and older:   A BMI below 18.5 is considered underweight.  A BMI of 18.5 to 24.9 is normal.  A BMI of 25 to 29.9 is considered overweight.  A BMI of 30 and above is considered obese.  Watch levels of cholesterol and blood lipids  You should start having your blood tested for lipids and cholesterol at 68 years of age, then have this test every 5 years.  You may need to have your cholesterol levels checked more often if:  Your lipid or cholesterol levels are high.  You are older than 68 years of age.  You are at high risk for heart disease.  CANCER SCREENING   Lung Cancer  Lung cancer screening is recommended for adults 78-72 years old who are at high risk for lung cancer because of a history of smoking.  A yearly low-dose CT scan of the lungs is recommended for people who:  Currently smoke.  Have quit within the past 15 years.  Have at least a 30-pack-year history of smoking. A pack year is smoking an average of one pack of cigarettes a day for 1 year.  Yearly screening should continue until it has been 15 years since you quit.  Yearly screening should stop if you develop a health problem that would  prevent you from having lung cancer treatment.  Breast Cancer  Practice breast self-awareness. This means understanding how your breasts normally appear and feel.  It also means doing regular breast self-exams. Let your health care provider know about any changes, no matter how small.  If you are in your 20s or 30s, you should have a clinical breast exam (CBE) by a health care provider every 1-3 years as part of a regular health exam.  If you are 3 or older, have a  CBE every year. Also consider having a breast X-ray (mammogram) every year.  If you have a family history of breast cancer, talk to your health care provider about genetic screening.  If you are at high risk for breast cancer, talk to your health care provider about having an MRI and a mammogram every year.  Breast cancer gene (BRCA) assessment is recommended for women who have family members with BRCA-related cancers. BRCA-related cancers include:  Breast.  Ovarian.  Tubal.  Peritoneal cancers.  Results of the assessment will determine the need for genetic counseling and BRCA1 and BRCA2 testing. Cervical Cancer Your health care provider may recommend that you be screened regularly for cancer of the pelvic organs (ovaries, uterus, and vagina). This screening involves a pelvic examination, including checking for microscopic changes to the surface of your cervix (Pap test). You may be encouraged to have this screening done every 3 years, beginning at age 34.  For women ages 60-65, health care providers may recommend pelvic exams and Pap testing every 3 years, or they may recommend the Pap and pelvic exam, combined with testing for human papilloma virus (HPV), every 5 years. Some types of HPV increase your risk of cervical cancer. Testing for HPV may also be done on women of any age with unclear Pap test results.  Other health care providers may not recommend any screening for nonpregnant women who are considered low risk for pelvic cancer and who do not have symptoms. Ask your health care provider if a screening pelvic exam is right for you.  If you have had past treatment for cervical cancer or a condition that could lead to cancer, you need Pap tests and screening for cancer for at least 20 years after your treatment. If Pap tests have been discontinued, your risk factors (such as having a new sexual partner) need to be reassessed to determine if screening should resume. Some women have  medical problems that increase the chance of getting cervical cancer. In these cases, your health care provider may recommend more frequent screening and Pap tests. Colorectal Cancer  This type of cancer can be detected and often prevented.  Routine colorectal cancer screening usually begins at 68 years of age and continues through 68 years of age.  Your health care provider may recommend screening at an earlier age if you have risk factors for colon cancer.  Your health care provider may also recommend using home test kits to check for hidden blood in the stool.  A small camera at the end of a tube can be used to examine your colon directly (sigmoidoscopy or colonoscopy). This is done to check for the earliest forms of colorectal cancer.  Routine screening usually begins at age 5.  Direct examination of the colon should be repeated every 5-10 years through 68 years of age. However, you may need to be screened more often if early forms of precancerous polyps or small growths are found. Skin Cancer  Check your skin from head  to toe regularly.  Tell your health care provider about any new moles or changes in moles, especially if there is a change in a mole's shape or color.  Also tell your health care provider if you have a mole that is larger than the size of a pencil eraser.  Always use sunscreen. Apply sunscreen liberally and repeatedly throughout the day.  Protect yourself by wearing long sleeves, pants, a wide-brimmed hat, and sunglasses whenever you are outside. HEART DISEASE, DIABETES, AND HIGH BLOOD PRESSURE   High blood pressure causes heart disease and increases the risk of stroke. High blood pressure is more likely to develop in:  People who have blood pressure in the high end of the normal range (130-139/85-89 mm Hg).  People who are overweight or obese.  People who are African American.  If you are 84-23 years of age, have your blood pressure checked every 3-5 years.  If you are 67 years of age or older, have your blood pressure checked every year. You should have your blood pressure measured twice--once when you are at a hospital or clinic, and once when you are not at a hospital or clinic. Record the average of the two measurements. To check your blood pressure when you are not at a hospital or clinic, you can use:  An automated blood pressure machine at a pharmacy.  A home blood pressure monitor.  If you are between 81 years and 72 years old, ask your health care provider if you should take aspirin to prevent strokes.  Have regular diabetes screenings. This involves taking a blood sample to check your fasting blood sugar level.  If you are at a normal weight and have a low risk for diabetes, have this test once every three years after 68 years of age.  If you are overweight and have a high risk for diabetes, consider being tested at a younger age or more often. PREVENTING INFECTION  Hepatitis B  If you have a higher risk for hepatitis B, you should be screened for this virus. You are considered at high risk for hepatitis B if:  You were born in a country where hepatitis B is common. Ask your health care provider which countries are considered high risk.  Your parents were born in a high-risk country, and you have not been immunized against hepatitis B (hepatitis B vaccine).  You have HIV or AIDS.  You use needles to inject street drugs.  You live with someone who has hepatitis B.  You have had sex with someone who has hepatitis B.  You get hemodialysis treatment.  You take certain medicines for conditions, including cancer, organ transplantation, and autoimmune conditions. Hepatitis C  Blood testing is recommended for:  Everyone born from [birth year] through 1963.  Anyone with known risk factors for hepatitis C. Sexually transmitted infections (STIs)  You should be screened for sexually transmitted infections (STIs) including gonorrhea and  chlamydia if:  You are sexually active and are younger than 68 years of age.  You are older than 68 years of age and your health care provider tells you that you are at risk for this type of infection.  Your sexual activity has changed since you were last screened and you are at an increased risk for chlamydia or gonorrhea. Ask your health care provider if you are at risk.  If you do not have HIV, but are at risk, it may be recommended that you take a prescription medicine daily to prevent HIV infection.  This is called pre-exposure prophylaxis (PrEP). You are considered at risk if:  You are sexually active and do not regularly use condoms or know the HIV status of your partner(s).  You take drugs by injection.  You are sexually active with a partner who has HIV. Talk with your health care provider about whether you are at high risk of being infected with HIV. If you choose to begin PrEP, you should first be tested for HIV. You should then be tested every 3 months for as long as you are taking PrEP.  PREGNANCY   If you are premenopausal and you may become pregnant, ask your health care provider about preconception counseling.  If you may become pregnant, take 400 to 800 micrograms (mcg) of folic acid every day.  If you want to prevent pregnancy, talk to your health care provider about birth control (contraception). OSTEOPOROSIS AND MENOPAUSE   Osteoporosis is a disease in which the bones lose minerals and strength with aging. This can result in serious bone fractures. Your risk for osteoporosis can be identified using a bone density scan.  If you are 61 years of age or older, or if you are at risk for osteoporosis and fractures, ask your health care provider if you should be screened.  Ask your health care provider whether you should take a calcium or vitamin D supplement to lower your risk for osteoporosis.  Menopause may have certain physical symptoms and risks.  Hormone replacement  therapy may reduce some of these symptoms and risks. Talk to your health care provider about whether hormone replacement therapy is right for you.  HOME CARE INSTRUCTIONS   Schedule regular health, dental, and eye exams.  Stay current with your immunizations.   Do not use any tobacco products including cigarettes, chewing tobacco, or electronic cigarettes.  If you are pregnant, do not drink alcohol.  If you are breastfeeding, limit how much and how often you drink alcohol.  Limit alcohol intake to no more than 1 drink per day for nonpregnant women. One drink equals 12 ounces of beer, 5 ounces of wine, or 1 ounces of hard liquor.  Do not use street drugs.  Do not share needles.  Ask your health care provider for help if you need support or information about quitting drugs.  Tell your health care provider if you often feel depressed.  Tell your health care provider if you have ever been abused or do not feel safe at home.   This information is not intended to replace advice given to you by your health care provider. Make sure you discuss any questions you have with your health care provider.   Document Released: 02/23/2011 Document Revised: 08/31/2014 Document Reviewed: 07/12/2013 Elsevier Interactive Patient Education [employer].

## 2015-09-16 NOTE — Progress Notes (Signed)
Subjective:    Patient ID: Jo Gomez, female    DOB: 03/12/1946, 70 y.o.   MRN: [id number]  HPI She is here to establish with a new pcp.   She is here for a pe.    She has been experiencing a lot of stress recently. Her partner is currently receiving chemo for breast cancer and then will have radiation and surgery.  Her son has mental problems and is having a lot of issues.  She is currently taking care of her 25 year old granddaughter who has autism and her daughter visits on the weekends.  She is a retired Education officer, museum.  She is exercising regularly and knows this helps. She feels that she has a good support system.  Depression: She has a history of depression and is currently taking sertraline 100 mg daily. She does feel that her depression is fairly controlled.  Insomnia: She is taking trazodone nightly and this works well.  Elevated blood pressure: She states her blood pressure is typically well controlled and she thinks the stress may be causing the elevated here today.  B-12 deficiency: She gives herself monthly B12 injections.  Hypothyroidism: She is taking her thyroid medication daily and her thyroid level has been stable.    Medications and allergies reviewed with patient and updated if appropriate.  Patient Active Problem List   Diagnosis Date Noted  . Insomnia 09/16/2015  . Low back pain radiating to left leg 06/25/2014  . 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 HIATAL HERNIA WITH REFLUX 07/09/2010  . Vitamin B12 deficiency 03/05/2010  . ESOPHAGEAL REFLUX 03/04/2010  . 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 Hypothyroidism 09/05/2007  . ALLERGIC RHINITIS 09/05/2007  . Depression 10/21/2006  . Osteopenia 10/21/2006    Current Outpatient Prescriptions on File Prior to Visit  Medication Sig Dispense Refill  . acyclovir (ZOVIRAX) 400 MG tablet One tablet 4 times daily when necessary 50 tablet 3  . alendronate (FOSAMAX) 10 MG  tablet Take 1 tablet (10 mg total) by mouth daily before breakfast. Take with a full glass of water on an empty stomach. 90 tablet 4  . EST ESTROGENS-METHYLTEST HS 0.625-1.25 MG per tablet Take 1 tablet by mouth daily. Using 1/3-1/2 Mon, Weds, Friday 30 tablet 12  . MOVIPREP 100 G SOLR Moviprep as directed, no substitutions 1 kit 0   No current facility-administered medications on file prior to visit.    Past Medical History  Diagnosis Date  . Osteoporosis   . Asthma   . Hiatal hernia   . Low vitamin B12 level   . Ovarian cyst     TAH/BSO  . Thyroid dysfunction   . Allergy   . Depression   . 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 IgA deficiency [facility])     Past Surgical History  Procedure Laterality Date  . Broken arm      surgery for this  . Total abdominal hysterectomy w/ bilateral salpingoophorectomy  1999  . Thyroidectomy  2000  . Brow lift  1/15  . Colonoscopy      Social History   Social History  . Marital Status: Significant Other    Spouse Name: N/A  . Number of Children: N/A  . Years of Education: N/A   Social History Main Topics  . Smoking status: Former Research scientist (life sciences)  . Smokeless tobacco: Never Used     Comment: in college  . Alcohol Use: 6.0  oz/week    10 Standard drinks or equivalent per week     Comment: glass of wine  . Drug Use: No  . Sexual Activity:    Partners: Female    Patent examiner Protection: Surgical     Comment: TAH/BSO   Other Topics Concern  . Not on file   Social History Narrative    Family History  Problem Relation Age of Onset  . COPD Father     chronic lung infections  . Colon polyps Father   . Heart attack Mother   . Osteoporosis Mother   . Breast cancer Mother 67  . Lung cancer Mother   . Colon polyps Mother   . Colon cancer Neg Hx   . Esophageal cancer Neg Hx   . Stomach cancer Neg Hx   . Rectal cancer Neg Hx     Review of Systems  Constitutional: Negative for fever, chills, appetite change, fatigue and  unexpected weight change.  HENT: Negative for hearing loss.   Eyes: Negative for visual disturbance.  Respiratory: Negative for cough, shortness of breath and wheezing.   Cardiovascular: Positive for palpitations (anxiety). Negative for chest pain and leg swelling.  Gastrointestinal: Negative for nausea, abdominal pain, diarrhea, constipation and blood in stool.       Occ GERD  Genitourinary: Negative for dysuria and hematuria.  Musculoskeletal: Positive for back pain and arthralgias.  Skin: Negative for rash.  Neurological: Negative for dizziness, weakness, light-headedness, numbness and headaches.  Psychiatric/Behavioral: Positive for dysphoric mood. The patient is nervous/anxious.        Objective:   Filed Vitals:   09/16/15 1457 09/16/15 1533  BP: 140/84 148/78  Pulse: 62   Temp: 97.8 F (36.6 C)   Resp: 16    Filed Weights   09/16/15 1457  Weight: 111 lb (50.349 kg)   Body mass index is 21.68 kg/(m^2).   Physical Exam Constitutional: She appears well-developed and well-nourished. No distress.  HENT:  Head: Normocephalic and atraumatic.  Right Ear: External ear normal. Normal ear canal and TM Left Ear: External ear normal.  Normal ear canal and TM Mouth/Throat: Oropharynx is clear and moist.  Normal bilateral ear canals and tympanic membranes  Eyes: Conjunctivae and EOM are normal.  Neck: Neck supple. No tracheal deviation present. No thyromegaly present.  No carotid bruit  Cardiovascular: Normal rate, regular rhythm and normal heart sounds.   No murmur heard.  No edema. Pulmonary/Chest: Effort normal and breath sounds normal. No respiratory distress. She has no wheezes. She has no rales.  Breast: deferred to Gyn Abdominal: Soft. She exhibits no distension. There is no tenderness.  Lymphadenopathy: She has no cervical adenopathy.  Skin: Skin is warm and dry. She is not diaphoretic.  Psychiatric: She has a normal mood and affect. Her behavior is normal.          Assessment & Plan:   Physical exam: Screening blood work ordered Immunizations   up-to-date, discussed shingles vaccine and she declined since she is alert and shingles twice. We'll give her a pneumonia booster sometime in the next couple of years, but she has already had 3 pneumococcal 23 vaccines so I think it is okay to wait a year or 2 Colonoscopy she is in the process of scheduling, but delayed  colonoscopy because of everything that was going on Mammogram  up-to-date  Gyn Up-to-date  Dexa  up-to-date  Eye exams up to date EKG today Exercise - exercises regularly Weight  normal BMI  Skin  -  saw derm for skin check within the past years Substance abuse  no evidence of substance abuse   increased stress and anxiety related to her partner undergoing breast cancer treatment, son with mental illness and taking care of her 18 year old granddaughter with autism. Discussed possibly increasing the Zoloft, but she declined at this time. She will let me know if she changes her mind. She is a good support system. She is exercising regularly. Discussed possibly seeing a therapist She will start monitoring her blood pressure regularly since her blood pressure was elevated here today   See Problem List for Assessment and Plan of chronic medical problems.

## 2015-09-16 NOTE — Progress Notes (Signed)
Pre visit review using our clinic review tool, if applicable. No additional management support is needed unless otherwise documented below in the visit note. 

## 2015-09-16 NOTE — Assessment & Plan Note (Signed)
Given herself monthly B12 injections

## 2015-09-16 NOTE — Assessment & Plan Note (Signed)
Taking Fosamax Prescribed by GYN

## 2015-09-16 NOTE — Assessment & Plan Note (Signed)
Check TSH Titrate medication if needed

## 2015-09-17 ENCOUNTER — Telehealth: Payer: Self-pay

## 2015-09-17 NOTE — Telephone Encounter (Signed)
Call to patient to educate regarding AWV; LEFT VM for call back if interested/ no [phone] or [phone] but Dr. Quay Burow did address preventive screens; Invited to schedule to set wellness goals if interested

## 2015-09-18 ENCOUNTER — Other Ambulatory Visit (INDEPENDENT_AMBULATORY_CARE_PROVIDER_SITE_OTHER): Payer: Medicare Other

## 2015-09-18 DIAGNOSIS — E039 Hypothyroidism, unspecified: Secondary | ICD-10-CM

## 2015-09-18 DIAGNOSIS — Z0000 Encounter for general adult medical examination without abnormal findings: Secondary | ICD-10-CM | POA: Diagnosis not present

## 2015-09-18 DIAGNOSIS — M810 Age-related osteoporosis without current pathological fracture: Secondary | ICD-10-CM

## 2015-09-18 LAB — CBC WITH DIFFERENTIAL/PLATELET
Basophils Absolute: 0 10*3/uL (ref 0.0–0.1)
Basophils Relative: 0.3 % (ref 0.0–3.0)
Eosinophils Absolute: 0.2 10*3/uL (ref 0.0–0.7)
Eosinophils Relative: 4.5 % (ref 0.0–5.0)
HCT: 39.9 % (ref 36.0–46.0)
Hemoglobin: 13.5 g/dL (ref 12.0–15.0)
Lymphocytes Relative: 19.7 % (ref 12.0–46.0)
Lymphs Abs: 1.1 10*3/uL (ref 0.7–4.0)
MCHC: 33.7 g/dL (ref 30.0–36.0)
MCV: 92.3 fl (ref 78.0–100.0)
Monocytes Absolute: 0.6 10*3/uL (ref 0.1–1.0)
Monocytes Relative: 11.4 % (ref 3.0–12.0)
Neutro Abs: 3.6 10*3/uL (ref 1.4–7.7)
Neutrophils Relative %: 64.1 % (ref 43.0–77.0)
Platelets: 257 10*3/uL (ref 150.0–400.0)
RBC: 4.32 Mil/uL (ref 3.87–5.11)
RDW: 13.7 % (ref 11.5–15.5)
WBC: 5.5 10*3/uL (ref 4.0–10.5)

## 2015-09-18 LAB — LIPID PANEL
Cholesterol: 180 mg/dL (ref 0–200)
HDL: 61.2 mg/dL (ref >39.00)
LDL Cholesterol: 101 mg/dL — ABNORMAL HIGH (ref 0–99)
NonHDL: 118.68
Total CHOL/HDL Ratio: 3
Triglycerides: 86 mg/dL (ref 0.0–149.0)
VLDL: 17.2 mg/dL (ref 0.0–40.0)

## 2015-09-18 LAB — COMPREHENSIVE METABOLIC PANEL
ALT: 28 U/L (ref 0–35)
AST: 24 U/L (ref 0–37)
Albumin: 4 g/dL (ref 3.5–5.2)
Alkaline Phosphatase: 54 U/L (ref 39–117)
BUN: 11 mg/dL (ref 6–23)
CO2: 29 mEq/L (ref 19–32)
Calcium: 8.9 mg/dL (ref 8.4–10.5)
Chloride: 101 mEq/L (ref 96–112)
Creatinine, Ser: 0.77 mg/dL (ref 0.40–1.20)
GFR: 79.34 mL/min (ref >60.00)
Glucose, Bld: 98 mg/dL (ref 70–99)
Potassium: 4.4 mEq/L (ref 3.5–5.1)
Sodium: 136 mEq/L (ref 135–145)
Total Bilirubin: 0.5 mg/dL (ref 0.2–1.2)
Total Protein: 6.6 g/dL (ref 6.0–8.3)

## 2015-09-18 LAB — TSH: TSH: 1.07 u[IU]/mL (ref 0.35–4.50)

## 2015-09-19 ENCOUNTER — Encounter: Payer: Self-pay | Admitting: Internal Medicine

## 2015-09-19 LAB — HEPATITIS C ANTIBODY: HCV Ab: NEGATIVE

## 2015-09-30 ENCOUNTER — Encounter: Payer: Self-pay | Admitting: Internal Medicine

## 2015-09-30 DIAGNOSIS — F32A Depression, unspecified: Secondary | ICD-10-CM

## 2015-09-30 DIAGNOSIS — F329 Major depressive disorder, single episode, unspecified: Secondary | ICD-10-CM

## 2015-10-01 MED ORDER — SERTRALINE HCL 100 MG PO TABS: 150.0000 mg | ORAL_TABLET | Freq: Every day | ORAL | Status: DC

## 2015-10-04 ENCOUNTER — Other Ambulatory Visit (HOSPITAL_COMMUNITY): Payer: Medicare Other

## 2015-10-15 ENCOUNTER — Inpatient Hospital Stay: Admit: 2015-10-15 | Payer: Medicare Other | Admitting: Orthopedic Surgery

## 2015-10-15 SURGERY — ARTHROPLASTY, SHOULDER, TOTAL
Anesthesia: General | Laterality: Right

## 2015-10-28 ENCOUNTER — Ambulatory Visit: Payer: Medicare Other | Admitting: Family Medicine

## 2015-10-28 ENCOUNTER — Telehealth: Payer: Self-pay | Admitting: Family Medicine

## 2015-10-29 ENCOUNTER — Ambulatory Visit (INDEPENDENT_AMBULATORY_CARE_PROVIDER_SITE_OTHER): Payer: Medicare Other | Admitting: Family Medicine

## 2015-10-29 ENCOUNTER — Encounter: Payer: Self-pay | Admitting: Family Medicine

## 2015-10-29 ENCOUNTER — Ambulatory Visit (INDEPENDENT_AMBULATORY_CARE_PROVIDER_SITE_OTHER)
Admission: RE | Admit: 2015-10-29 | Discharge: 2015-10-29 | Disposition: A | Discharge status: Home / Self Care | Payer: Medicare Other | Source: Ambulatory Visit | Attending: Family Medicine | Admitting: Family Medicine

## 2015-10-29 VITALS — BP 118/72 | HR 83 | Temp 98.7°F | Ht 60.0 in | Wt 112.5 lb

## 2015-10-29 DIAGNOSIS — R05 Cough: Secondary | ICD-10-CM | POA: Diagnosis not present

## 2015-10-29 DIAGNOSIS — J0100 Acute maxillary sinusitis, unspecified: Secondary | ICD-10-CM

## 2015-10-29 DIAGNOSIS — R059 Cough, unspecified: Secondary | ICD-10-CM

## 2015-10-29 DIAGNOSIS — J4521 Mild intermittent asthma with (acute) exacerbation: Secondary | ICD-10-CM | POA: Diagnosis not present

## 2015-10-29 MED ORDER — PREDNISONE 20 MG PO TABS: 40.0000 mg | ORAL_TABLET | Freq: Every day | ORAL | Status: DC

## 2015-10-29 MED ORDER — AMOXICILLIN-POT CLAVULANATE 875-125 MG PO TABS
1.0000 | ORAL_TABLET | Freq: Two times a day (BID) | ORAL | Status: DC
Start: 2015-10-29 — End: 2015-11-09

## 2015-10-29 NOTE — Progress Notes (Signed)
HPI:  Jo Gomez is a pleasant 70 year old with a past medical history significant for allergic rhinitis and  Asthma here for an acute visit for:  Cough and sinus congestion: -started:3 weeks ago and is worsening - associated symptoms:nasal congestion - now thick and yellow, postnasal drip, productive cough, chest tightness and wheezy feeling in chest with coughing, sinus pain -denies:fever, SOB, NVD, tooth pain -has tried: Nothing -sick contacts/travel/risks: no reported flu, strep or tick exposure -Hx of: allergies and asthma, reports she hasn't needed albuterol on numerous occasions for her asthma when she gets a cold or sinus infection, but does not take albuterol and does not wish to as she feels it makes symptoms worse ROS: See pertinent positives and negatives per HPI.  Past Medical History  Diagnosis Date  . Osteoporosis   . Asthma   . Hiatal hernia   . Low vitamin B12 level   . Ovarian cyst     TAH/BSO  . Thyroid dysfunction   . Allergy   . Depression   . 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 Broken arm      surgery for this  . Total abdominal hysterectomy w/ bilateral salpingoophorectomy  1999  . Thyroidectomy  2000  . Brow lift  1/15  . Colonoscopy      Family History  Problem Relation Age of Onset  . COPD Father     chronic lung infections  . Colon polyps Father   . Heart attack Mother   . Osteoporosis Mother   . Breast cancer Mother 47  . Lung cancer Mother   . Colon polyps Mother   . Colon cancer Neg Hx   . Esophageal cancer Neg Hx   . Stomach cancer Neg Hx   . Rectal cancer Neg Hx     Social History   Social History  . Marital Status: Significant Other    Spouse Name: N/A  . Number of Children: N/A  . Years of Education: N/A   Social History Main Topics  . Smoking status: Former Research scientist (life sciences)  . Smokeless tobacco: Never Used     Comment: in college  .  Alcohol Use: 6.0 oz/week    10 Standard drinks or equivalent per week     Comment: glass of wine  . Drug Use: No  . Sexual Activity:    Partners: Female    Patent examiner Protection: Surgical     Comment: TAH/BSO   Other Topics Concern  . None   Social History Narrative     Current outpatient prescriptions:  .  acyclovir (ZOVIRAX) 400 MG tablet, One tablet 4 times daily when necessary, Disp: 50 tablet, Rfl: 3 .  alendronate (FOSAMAX) 10 MG tablet, Take 1 tablet (10 mg total) by mouth daily before breakfast. Take with a full glass of water on an empty stomach., Disp: 90 tablet, Rfl: 4 .  cyanocobalamin (,VITAMIN B-12,) 1000 MCG/ML injection, INJECT 1 ML( 1,000 MCG) UNDER THE SKIN EVERY 30 DAYS, Disp: 30 mL, Rfl: 0 .  EST ESTROGENS-METHYLTEST HS 0.625-1.25 MG per tablet, Take 1 tablet by mouth daily. Using 1/3-1/2 Mon, Weds, Friday, Disp: 30 tablet, Rfl: 12 .  levothyroxine (SYNTHROID, LEVOTHROID) 75 MCG tablet, Take 1 tablet (75 mcg total) by mouth daily., Disp: 90 tablet, Rfl: 3 .  MOVIPREP 100 G SOLR, Moviprep as directed, no substitutions, Disp: 1 kit, Rfl: 0 .  sertraline (ZOLOFT) 100 MG tablet, Take 1.5 tablets (150 mg total) by mouth daily., Disp: 135 tablet, Rfl: 3 .  traZODone (DESYREL) 150 MG tablet, Take 1 tablet (150 mg total) by mouth at bedtime., Disp: 90 tablet, Rfl: 3 .  TUBERCULIN SYR 1CC/25GX5/8" (B-D TB SYRINGE 1CC/25GX5/8") 25G X 5/8" 1 ML MISC, USE AS DIRECTED FOR (B-12) INJECTIONS, Disp: 20 each, Rfl: 1 .  Vitamin D, Ergocalciferol, (DRISDOL) 50000 units CAPS capsule, Take 1 capsule (50,000 Units total) by mouth every 30 (thirty) days., Disp: 12 capsule, Rfl: 0 .  amoxicillin-clavulanate (AUGMENTIN) 875-125 MG tablet, Take 1 tablet by mouth 2 (two) times daily., Disp: 20 tablet, Rfl: 0 .  predniSONE (DELTASONE) 20 MG tablet, Take 2 tablets (40 mg total) by mouth daily with breakfast., Disp: 8 tablet, Rfl: 0  EXAM:  Filed Vitals:   10/29/15 1126  BP: 118/72   Pulse: 83  Temp: 98.7 F (37.1 C)    Body mass index is 21.97 kg/(m^2).  GENERAL: vitals reviewed and listed above, alert, oriented, appears well hydrated and in no acute distress  HEENT: atraumatic, conjunttiva clear, no obvious abnormalities on inspection of external nose and ears, normal appearance of ear canals and TMs, thick  nasal congestion, mild post oropharyngeal erythema with PND, no tonsillar edema or exudate, no sinus TTP  NECK: no obvious masses on inspection  LUNGS: clear to auscultation bilaterally, no wheezes, rales or rhonchi, good air movement  CV: HRRR, no peripheral edema  MS: moves all extremities without noticeable abnormality  PSYCH: pleasant and cooperative, no obvious depression or anxiety  ASSESSMENT AND PLAN:  Discussed the following assessment and plan:  Asthma with acute exacerbation, mild intermittent  Acute maxillary sinusitis, recurrence not specified  Cough - Plan: DG Chest 2 View  -Suspect sinusitis with asthma exacerbation or bronchitis -Opted for treatment with Augmentin and short course of prednisone -She would like a chest x-ray as well and orders placed -of course, we advised to return or notify a doctor immediately if symptoms worsen or persist or new concerns arise.    Patient Instructions  Before you leave: -X-ray sheet  Take the antibiotic, Augmentin, and the prednisone as instructed.  Please follow up with your doctor if symptoms worsen, new symptoms develop or your symptoms persist despite treatment.     Colin Benton R.

## 2015-10-29 NOTE — Telephone Encounter (Signed)
Pt was no show 10/28/15 4:00pm for acute appt, pt scheduled for [place] office today, charge or no charge?

## 2015-10-29 NOTE — Patient Instructions (Signed)
Before you leave: -X-ray sheet  Take the antibiotic, Augmentin, and the prednisone as instructed.  Please follow up with your doctor if symptoms worsen, new symptoms develop or your symptoms persist despite treatment.

## 2015-10-29 NOTE — Telephone Encounter (Signed)
-----   Message from Darreld Mclean, MD sent at 10/29/2015  2:13 PM EST ----- Yes, charge please

## 2015-10-29 NOTE — Telephone Encounter (Signed)
Marked to charge and mailing no show letter °

## 2015-10-29 NOTE — Progress Notes (Signed)
Pre visit review using our clinic review tool, if applicable. No additional management support is needed unless otherwise documented below in the visit note. 

## 2015-10-30 ENCOUNTER — Encounter: Payer: Self-pay | Admitting: Family Medicine

## 2015-10-30 ENCOUNTER — Other Ambulatory Visit: Payer: Self-pay

## 2015-10-30 DIAGNOSIS — Z1231 Encounter for screening mammogram for malignant neoplasm of breast: Secondary | ICD-10-CM

## 2015-10-31 ENCOUNTER — Encounter: Payer: Self-pay | Admitting: Family Medicine

## 2015-10-31 MED ORDER — ALBUTEROL SULFATE HFA 108 (90 BASE) MCG/ACT IN AERS: 2.0000 | INHALATION_SPRAY | Freq: Four times a day (QID) | RESPIRATORY_TRACT | Status: DC | PRN

## 2015-11-04 NOTE — Telephone Encounter (Signed)
° °-----   Message -----    From: Darreld Mclean, MD    Sent: 11/01/2015   8:51 AM      To: Margot Ables  Can we please waive her recent no- show fee?  She had a family emergency and emailed me today Thanks!!

## 2015-11-08 ENCOUNTER — Encounter: Payer: Self-pay | Admitting: Gastroenterology

## 2015-11-08 ENCOUNTER — Encounter: Payer: Self-pay | Admitting: Family Medicine

## 2015-11-08 ENCOUNTER — Other Ambulatory Visit: Payer: Self-pay | Admitting: Family Medicine

## 2015-11-08 ENCOUNTER — Other Ambulatory Visit: Payer: Self-pay | Admitting: Internal Medicine

## 2015-11-08 NOTE — Telephone Encounter (Signed)
I called the pt and informed her of the message below and she stated she has an appt for tomorrow.

## 2015-11-08 NOTE — Telephone Encounter (Signed)
Why is she requesting a refill of an antibiotic?

## 2015-11-08 NOTE — Telephone Encounter (Signed)
Advise reevaluation. Please let her know about Saturday [facility]. I just received this message now or we would have seen her today. Should follow-up with her regular provider if possible or go to [facility] over the weekend if she cannot wait until next week. Thanks.

## 2015-11-08 NOTE — Telephone Encounter (Signed)
Spoke with Jo Gomez. She stated that she spoke with nurse at Dr Julianne Rice office and was told to call the RX in and they would fill this. Jo Gomez took last tablet today. Jo Gomez was seen by Dr Maudie Mercury on 10/29/15. Stated that she sent email on 11/07/15 but I do not see in chart. Please advise, Jo Gomez would need to be seen by Dr Quay Burow to have a antibiotic sent in.

## 2015-11-09 ENCOUNTER — Encounter: Payer: Self-pay | Admitting: Adult Health

## 2015-11-09 ENCOUNTER — Ambulatory Visit (INDEPENDENT_AMBULATORY_CARE_PROVIDER_SITE_OTHER): Payer: Medicare Other | Admitting: Adult Health

## 2015-11-09 VITALS — BP 130/76 | HR 74 | Temp 98.0°F | Resp 18 | Ht 60.0 in | Wt 113.5 lb

## 2015-11-09 DIAGNOSIS — J069 Acute upper respiratory infection, unspecified: Secondary | ICD-10-CM | POA: Diagnosis not present

## 2015-11-09 MED ORDER — METHYLPREDNISOLONE 4 MG PO TBPK: ORAL_TABLET | ORAL | Status: DC

## 2015-11-09 MED ORDER — AMOXICILLIN-POT CLAVULANATE 875-125 MG PO TABS: 1.0000 | ORAL_TABLET | Freq: Two times a day (BID) | ORAL | Status: DC

## 2015-11-09 NOTE — Patient Instructions (Signed)
It was great meeting you today!  I am sorry you are still feeling bad.   I have sent in another 5 days of Augmentin and a Prednisone Dose pack.   Follow up with PCP if no improvement.

## 2015-11-09 NOTE — Progress Notes (Signed)
   Subjective:    Patient ID: Jo Gomez, female    DOB: Apr 21, 1946, 70 y.o.   MRN: [id number]  HPI  70 year old female who [facility] for persistent cough and URI type symptoms. She was seen by Dr. Maudie Mercury earlier last week and was prescribed a 10 day course of Augmentin and prednisone.   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   She continues to have a semi productive cough and sinus pain and pressure. Denies having a fever.  Review of Systems  Constitutional: Positive for activity change and fatigue. Negative for fever.  HENT: Positive for congestion and sinus pressure. Negative for ear discharge and ear pain.   Respiratory: Positive for cough, shortness of breath and wheezing.   Cardiovascular: Negative.        Objective:   Physical Exam  Constitutional: She is oriented to person, place, and time. She appears well-developed and well-nourished. No distress.  HENT:  Head: Normocephalic and atraumatic.  Right Ear: External ear normal.  Left Ear: External ear normal.  Nose: Nose normal.  Mouth/Throat: Oropharynx is clear and moist. No oropharyngeal exudate.  Cardiovascular: Normal rate, regular rhythm, normal heart sounds and intact distal pulses.  Exam reveals no gallop and no friction rub.   No murmur heard. Pulmonary/Chest: Effort normal. No respiratory distress. She has wheezes (cleared with cough). She has no rales. She exhibits no tenderness.  Neurological: She is alert and oriented to person, place, and time.  Skin: Skin is warm and dry. No rash noted. She is not diaphoretic. No erythema. No pallor.  Psychiatric: She has a normal mood and affect. Her behavior is normal. Judgment and thought content normal.  Nursing note and vitals reviewed.     Assessment & Plan:  1. Acute upper respiratory infection - methylPREDNISolone (MEDROL DOSEPAK) 4 MG TBPK tablet; Take as directed  Dispense: 21 tablet; Refill: 0 -  amoxicillin-clavulanate (AUGMENTIN) 875-125 MG tablet; Take 1 tablet by mouth 2 (two) times daily.  Dispense: 10 tablet; Refill: 0

## 2015-11-09 NOTE — Progress Notes (Signed)
Pre visit review using our clinic review tool, if applicable. No additional management support is needed unless otherwise documented below in the visit note. 

## 2015-11-11 NOTE — Telephone Encounter (Signed)
See prior note-pt was informed the Rx was denied as she needs an appt.

## 2015-11-18 ENCOUNTER — Ambulatory Visit: Payer: Medicare Other | Admitting: Internal Medicine

## 2015-11-19 ENCOUNTER — Ambulatory Visit
Admission: RE | Admit: 2015-11-19 | Discharge: 2015-11-19 | Disposition: A | Discharge status: Home / Self Care | Payer: Medicare Other | Source: Ambulatory Visit

## 2015-11-19 DIAGNOSIS — Z1231 Encounter for screening mammogram for malignant neoplasm of breast: Secondary | ICD-10-CM

## 2015-12-01 ENCOUNTER — Other Ambulatory Visit: Payer: Self-pay | Admitting: Family Medicine

## 2015-12-16 ENCOUNTER — Other Ambulatory Visit: Payer: Self-pay | Admitting: Obstetrics & Gynecology

## 2015-12-16 NOTE — Telephone Encounter (Signed)
Medication refill request: Fosamax Last AEX:  11-29-14 Next AEX: 02-14-16  Last MMG (if hormonal medication request): 11-19-15 WNL Refill authorized: please advise   Sending to JJ since SM is out of the office

## 2016-01-07 ENCOUNTER — Ambulatory Visit: Payer: Medicare Other | Admitting: Adult Health

## 2016-01-13 ENCOUNTER — Ambulatory Visit (INDEPENDENT_AMBULATORY_CARE_PROVIDER_SITE_OTHER): Payer: Medicare Other | Admitting: Family

## 2016-01-13 VITALS — BP 102/60 | HR 65 | Temp 98.0°F | Ht 60.0 in | Wt 114.0 lb

## 2016-01-13 DIAGNOSIS — J019 Acute sinusitis, unspecified: Secondary | ICD-10-CM

## 2016-01-13 MED ORDER — AMOXICILLIN 500 MG PO CAPS: 500.0000 mg | ORAL_CAPSULE | Freq: Two times a day (BID) | ORAL | Status: DC

## 2016-01-13 NOTE — Progress Notes (Signed)
Subjective:    Patient ID: Jo Gomez, female    DOB: 08-Feb-1946, 70 y.o.   MRN: [id number]   Jo Gomez is a 70 y.o. female who presents today for an acute visit.    Sinus Problem This is a new problem. The current episode started 1 to 4 weeks ago. The problem has been waxing and waning since onset. There has been no fever. The pain is mild. Associated symptoms include chills, congestion, coughing, headaches ('sinus headache and pressure') and sinus pressure. Pertinent negatives include no sore throat. Treatments tried: nasal spray. The treatment provided mild relief.   Past Medical History  Diagnosis Date  . Osteoporosis   . Asthma   . Hiatal hernia   . Low vitamin B12 level   . Ovarian cyst     TAH/BSO  . Thyroid dysfunction   . Allergy   . Depression   . 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 IgA deficiency (HCC)    Allergies: Clarithromycin; Erythromycin base; Codeine; Sulfonamide derivatives; and Tetracycline Current Outpatient Prescriptions on File Prior to Visit  Medication Sig Dispense Refill  . alendronate (FOSAMAX) 10 MG tablet TAKE 1 TABLET(10 MG) BY MOUTH DAILY BEFORE BREAKFAST WITH A FULL GLASS OF WATER AND ON AN EMPTY STOMACH 90 tablet 0  . cyanocobalamin (,VITAMIN B-12,) 1000 MCG/ML injection INJECT 1 ML( 1,000 MCG) UNDER THE SKIN EVERY 30 DAYS 30 mL 0  . EST ESTROGENS-METHYLTEST HS 0.625-1.25 MG per tablet Take 1 tablet by mouth daily. Using 1/3-1/2 Mon, Weds, Friday 30 tablet 12  . levothyroxine (SYNTHROID, LEVOTHROID) 75 MCG tablet Take 1 tablet (75 mcg total) by mouth daily. 90 tablet 3  . sertraline (ZOLOFT) 100 MG tablet Take 1.5 tablets (150 mg total) by mouth daily. 135 tablet 3  . traZODone (DESYREL) 150 MG tablet Take 1 tablet (150 mg total) by mouth at bedtime. 90 tablet 3  . TUBERCULIN SYR 1CC/25GX5/8" (B-D TB SYRINGE 1CC/25GX5/8") 25G X 5/8" 1 ML MISC USE AS DIRECTED FOR (B-12) INJECTIONS 20 each 1  . Vitamin D,  Ergocalciferol, (DRISDOL) 50000 units CAPS capsule Take 1 capsule (50,000 Units total) by mouth every 30 (thirty) days. 12 capsule 0  . acyclovir (ZOVIRAX) 400 MG tablet One tablet 4 times daily when necessary (Patient not taking: Reported on 01/13/2016) 50 tablet 3  . albuterol (PROVENTIL HFA;VENTOLIN HFA) 108 (90 Base) MCG/ACT inhaler Inhale 2 puffs into the lungs every 6 (six) hours as needed. (Patient not taking: Reported on 01/13/2016) 1 Inhaler 0  . MOVIPREP 100 G SOLR Moviprep as directed, no substitutions (Patient not taking: Reported on 01/13/2016) 1 kit 0   No current facility-administered medications on file prior to visit.    Social History  Substance Use Topics  . Smoking status: Former Research scientist (life sciences)  . Smokeless tobacco: Never Used     Comment: in college  . Alcohol Use: 6.0 oz/week    10 Standard drinks or equivalent per week     Comment: glass of wine    Review of Systems  Constitutional: Positive for chills. Negative for fever.  HENT: Positive for congestion, rhinorrhea and sinus pressure. Negative for sore throat.   Respiratory: Positive for cough.   Cardiovascular: Negative for chest pain and palpitations.  Gastrointestinal: Negative for nausea and vomiting.  Neurological: Positive for headaches ('sinus headache and pressure').      Objective:    BP 102/60 mmHg  Pulse 65  Temp(Src) 98 F (36.7 C) (Oral)  Ht 5' (1.524 m)  Wt 114 lb (51.71 kg)  BMI 22.26 kg/m2  SpO2 97%  LMP 08/24/1997   Physical Exam  Constitutional: She appears well-developed and well-nourished.  HENT:  Head: Normocephalic and atraumatic.  Right Ear: Hearing, tympanic membrane, external ear and ear canal normal. No drainage, swelling or tenderness. No foreign bodies. Tympanic membrane is not erythematous and not bulging. No middle ear effusion. No decreased hearing is noted.  Left Ear: Hearing, tympanic membrane, external ear and ear canal normal. No drainage, swelling or tenderness. No foreign  bodies. Tympanic membrane is not erythematous and not bulging.  No middle ear effusion. No decreased hearing is noted.  Nose: Rhinorrhea present. Right sinus exhibits frontal sinus tenderness. Right sinus exhibits no maxillary sinus tenderness. Left sinus exhibits frontal sinus tenderness. Left sinus exhibits no maxillary sinus tenderness.  Mouth/Throat: Uvula is midline, oropharynx is clear and moist and mucous membranes are normal. No oropharyngeal exudate, posterior oropharyngeal edema, posterior oropharyngeal erythema or tonsillar abscesses.  Eyes: Conjunctivae are normal.  Cardiovascular: Regular rhythm, normal heart sounds and normal pulses.   Pulmonary/Chest: Effort normal and breath sounds normal. She has no wheezes. She has no rhonchi. She has no rales.  Lymphadenopathy:       Head (right side): No submental, no submandibular, no tonsillar, no preauricular, no posterior auricular and no occipital adenopathy present.       Head (left side): No submental, no submandibular, no tonsillar, no preauricular, no posterior auricular and no occipital adenopathy present.    She has no cervical adenopathy.  Neurological: She is alert.  Skin: Skin is warm and dry.  Psychiatric: She has a normal mood and affect. Her speech is normal and behavior is normal. Thought content normal.  Vitals reviewed.      Assessment & Plan:   1. Acute sinusitis, recurrence not specified, unspecified location Frontal sinus tenderness. Due to duration of symptoms, patient and I agreed to treat with antibiotics. I treated with a 14 day course of amoxicillin as this patient has history of IgG deficiency and states that usually she requires 2 weeks of treatment.  - amoxicillin (AMOXIL) 500 MG capsule; Take 1 capsule (500 mg total) by mouth 2 (two) times daily.  Dispense: 28 capsule; Refill: 0    I have discontinued [name] methylPREDNISolone and amoxicillin-clavulanate. I am also having her maintain her  acyclovir, MOVIPREP, EST ESTROGENS-METHYLTEST HS, cyanocobalamin, TUBERCULIN SYR 1CC/25GX5/8", traZODone, levothyroxine, Vitamin D (Ergocalciferol), sertraline, albuterol, and alendronate.   No orders of the defined types were placed in this encounter.     Start medications as prescribed and explained to patient on After Visit Summary ( AVS). Risks, benefits, and alternatives of the medications and treatment plan prescribed today were discussed, and patient expressed understanding.   Education regarding symptom management and diagnosis given to patient.   Follow-up:Plan follow-up as discussed or as needed if any worsening symptoms or change in condition.   Continue to follow with Binnie Rail, MD for routine health maintenance.   Jo Gomez and I agreed with plan.   Mable Paris, FNP

## 2016-01-13 NOTE — Progress Notes (Signed)
Pre visit review using our clinic review tool, if applicable. No additional management support is needed unless otherwise documented below in the visit note. 

## 2016-01-13 NOTE — Patient Instructions (Signed)

## 2016-01-22 ENCOUNTER — Encounter: Payer: Self-pay | Admitting: Internal Medicine

## 2016-01-22 ENCOUNTER — Ambulatory Visit (INDEPENDENT_AMBULATORY_CARE_PROVIDER_SITE_OTHER): Payer: Medicare Other | Admitting: Internal Medicine

## 2016-01-22 VITALS — BP 112/62 | HR 70 | Temp 98.9°F | Resp 20 | Wt 113.0 lb

## 2016-01-22 DIAGNOSIS — J309 Allergic rhinitis, unspecified: Secondary | ICD-10-CM | POA: Diagnosis not present

## 2016-01-22 DIAGNOSIS — D802 Selective deficiency of immunoglobulin A [IgA]: Secondary | ICD-10-CM

## 2016-01-22 DIAGNOSIS — J019 Acute sinusitis, unspecified: Secondary | ICD-10-CM

## 2016-01-22 DIAGNOSIS — J329 Chronic sinusitis, unspecified: Secondary | ICD-10-CM | POA: Insufficient documentation

## 2016-01-22 MED ORDER — CLINDAMYCIN HCL 300 MG PO CAPS: 300.0000 mg | ORAL_CAPSULE | Freq: Three times a day (TID) | ORAL | Status: DC

## 2016-01-22 NOTE — Assessment & Plan Note (Signed)
stable overall by history and exam, recent data reviewed with pt, and pt to continue medical treatment as before,  to f/u any worsening symptoms or concerns Lab Results  Component Value Date   WBC 5.5 09/18/2015   HGB 13.5 09/18/2015   HCT 39.9 09/18/2015   PLT 257.0 09/18/2015   GLUCOSE 98 09/18/2015   CHOL 180 09/18/2015   TRIG 86.0 09/18/2015   HDL 61.20 09/18/2015   LDLDIRECT 114.9 08/07/2013   LDLCALC 101* 09/18/2015   ALT 28 09/18/2015   AST 24 09/18/2015   NA 136 09/18/2015   K 4.4 09/18/2015   CL 101 09/18/2015   CREATININE 0.77 09/18/2015   BUN 11 09/18/2015   CO2 29 09/18/2015   TSH 1.07 09/18/2015

## 2016-01-22 NOTE — Patient Instructions (Signed)
Please take all new medication as prescribed - the antibiotic  You can also take Delsym OTC for cough, and/or Mucinex (or it's generic off brand) for congestion, and tylenol as needed for pain.  Please continue all other medications as before, and refills have been done if requested.  Please have the pharmacy call with any other refills you may need.  Please keep your appointments with your specialists as you may have planned    

## 2016-01-22 NOTE — Assessment & Plan Note (Signed)
Mild to mod, for otc zyrtec prn,  to f/u any worsening symptoms or concerns ?

## 2016-01-22 NOTE — Progress Notes (Signed)
Subjective:    Patient ID: Jo Gomez, female    DOB: 1945-11-20, 70 y.o.   MRN: [id number]  HPI  Here with 4 wks ongoing upper resp symptoms, seemed to start as simple URI with nasal stuffiness and clearish congestion at the outset after exposure to a sick grandchild.  Has Hx of Iga deficiency, long hx of infections. Has numerous allergies/intol to mult classes of antibx, and now appears to have failed amoxil course as this was rx per NP here 5/22, but unfortunately with worsening facial and forehead pain, fever, malaise, headache, general weakness, with mild ST and minor non prod cough, but pt denies chest pain, wheezing, increased sob or doe, orthopnea, PND, increased LE swelling, palpitations, dizziness or syncope.  Does have several months ongoing nasal allergy symptoms with clearish congestion as well, with itch and sneezing, without fever, pain, ST, cough, swelling or wheezing. Past Medical History  Diagnosis Date  . Osteoporosis   . Asthma   . Hiatal hernia   . Low vitamin B12 level   . Ovarian cyst     TAH/BSO  . Thyroid dysfunction   . Allergy   . Depression   . 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 Broken arm      surgery for this  . Total abdominal hysterectomy w/ bilateral salpingoophorectomy  1999  . Thyroidectomy  2000  . Brow lift  1/15  . Colonoscopy      reports that she has quit smoking. She has never used smokeless tobacco. She reports that she drinks about 6.0 oz of alcohol per week. She reports that she does not use illicit drugs. family history includes Breast cancer (age of onset: 81) in her mother; COPD in her father; Colon polyps in her father and mother; Heart attack in her mother; Lung cancer in her mother; Osteoporosis in her mother. There is no history of Colon cancer, Esophageal cancer, Stomach cancer, or Rectal cancer. Allergies  Allergen Reactions  .  Clarithromycin     Severe leg and muscle cramps  . Erythromycin Base Nausea And Vomiting  . Codeine Rash  . Sulfonamide Derivatives Rash  . Tetracycline Rash   Current Outpatient Prescriptions on File Prior to Visit  Medication Sig Dispense Refill  . acyclovir (ZOVIRAX) 400 MG tablet One tablet 4 times daily when necessary 50 tablet 3  . albuterol (PROVENTIL HFA;VENTOLIN HFA) 108 (90 Base) MCG/ACT inhaler Inhale 2 puffs into the lungs every 6 (six) hours as needed. 1 Inhaler 0  . alendronate (FOSAMAX) 10 MG tablet TAKE 1 TABLET(10 MG) BY MOUTH DAILY BEFORE BREAKFAST WITH A FULL GLASS OF WATER AND ON AN EMPTY STOMACH 90 tablet 0  . cyanocobalamin (,VITAMIN B-12,) 1000 MCG/ML injection INJECT 1 ML( 1,000 MCG) UNDER THE SKIN EVERY 30 DAYS 30 mL 0  . EST ESTROGENS-METHYLTEST HS 0.625-1.25 MG per tablet Take 1 tablet by mouth daily. Using 1/3-1/2 Mon, Weds, Friday 30 tablet 12  . levothyroxine (SYNTHROID, LEVOTHROID) 75 MCG tablet Take 1 tablet (75 mcg total) by mouth daily. 90 tablet 3  . MOVIPREP 100 G SOLR Moviprep as directed, no substitutions 1 kit 0  . sertraline (ZOLOFT) 100 MG tablet Take 1.5 tablets (150 mg total) by mouth daily. 135 tablet 3  . traZODone (DESYREL) 150 MG tablet Take 1 tablet (150 mg total) by mouth at bedtime. 90 tablet 3  .  TUBERCULIN SYR 1CC/25GX5/8" (B-D TB SYRINGE 1CC/25GX5/8") 25G X 5/8" 1 ML MISC USE AS DIRECTED FOR (B-12) INJECTIONS 20 each 1  . Vitamin D, Ergocalciferol, (DRISDOL) 50000 units CAPS capsule Take 1 capsule (50,000 Units total) by mouth every 30 (thirty) days. 12 capsule 0   No current facility-administered medications on file prior to visit.    Review of Systems  Constitutional: Negative for unusual diaphoresis or night sweats HENT: Negative for ear swelling or discharge Eyes: Negative for worsening visual haziness  Respiratory: Negative for choking and stridor.   Gastrointestinal: Negative for distension or worsening eructation Genitourinary:  Negative for retention or change in urine volume.  Musculoskeletal: Negative for other MSK pain or swelling Skin: Negative for color change and worsening wound Neurological: Negative for tremors and numbness other than noted  Psychiatric/Behavioral: Negative for decreased concentration or agitation other than above       Objective:   Physical Exam BP 112/62 mmHg  Pulse 70  Temp(Src) 98.9 F (37.2 C) (Oral)  Resp 20  Wt 113 lb (51.256 kg)  SpO2 97%  LMP 08/24/1997 VS noted, mild to mod ill, appears in pain Constitutional: Pt appears in no apparent distress HENT: Head: NCAT.  Right Ear: External ear normal.  Left Ear: External ear normal.  Bilat tm's with mild erythema.  Max sinus areas mild to mod tender left > right, but worse above ethmoid sinus/glabellar area without visible swelling,.  Pharynx with mild erythema, no exudate, no Neck LA Eyes: . Pupils are equal, round, and reactive to light. Conjunctivae and EOM are normal Neck: Normal range of motion. Neck supple.  Cardiovascular: Normal rate and regular rhythm.   Pulmonary/Chest: Effort normal and breath sounds without rales or wheezing.  Neurological: Pt is alert. Not confused , motor grossly intact Skin: Skin is warm. No rash, no LE edema Psychiatric: Pt behavior is normal. No agitation.     Assessment & Plan:

## 2016-01-22 NOTE — Progress Notes (Signed)
Pre visit review using our clinic review tool, if applicable. No additional management support is needed unless otherwise documented below in the visit note. 

## 2016-01-28 ENCOUNTER — Other Ambulatory Visit: Payer: Self-pay | Admitting: Internal Medicine

## 2016-02-06 ENCOUNTER — Ambulatory Visit (INDEPENDENT_AMBULATORY_CARE_PROVIDER_SITE_OTHER): Payer: Medicare Other | Admitting: Internal Medicine

## 2016-02-06 ENCOUNTER — Encounter: Payer: Self-pay | Admitting: Internal Medicine

## 2016-02-06 VITALS — BP 114/64 | HR 68 | Temp 98.1°F | Resp 16 | Wt 110.0 lb

## 2016-02-06 DIAGNOSIS — J321 Chronic frontal sinusitis: Secondary | ICD-10-CM | POA: Insufficient documentation

## 2016-02-06 MED ORDER — AMOXICILLIN-POT CLAVULANATE 875-125 MG PO TABS: 1.0000 | ORAL_TABLET | Freq: Two times a day (BID) | ORAL | Status: DC

## 2016-02-06 MED ORDER — METHYLPREDNISOLONE ACETATE 80 MG/ML IJ SUSP
80.0000 mg | Freq: Once | INTRAMUSCULAR | Status: AC
  Administered 2016-02-06: 80 mg via INTRAMUSCULAR

## 2016-02-06 NOTE — Progress Notes (Signed)
Subjective:    Patient ID: Jo Gomez, female    DOB: February 12, 1946, 70 y.o.   MRN: [id number]  HPI She is here for an acute visit.   She has had sinus symptoms/infection for the past 6 months. She has been seen by many people and has been on 6 antibiotics in the past 6 months.  She did see an ENT in the past and they advised considering sinus surgery to open up her frontal sinuses.  She did not want to have surgery.  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   In the past she has taken antibiotics with high doses of prednisone.  She did have leftover prednisone from two years ago and took some - 40 mg x 3 days, 20 mg x 3 days and is now on 10 mg.  She has two days left.    She has a chronic headache.  She feels tired and week.  She has a lot of nasal congestion, post nasal drip, and a bad taste in her mouth.  She is coughing from the PND.  Her frontal sinuses feel plugged.  Just recently she felt the sinuses were starting to unplug and she has had increased drainage, but still has an infection.  She has an appt tomorrow with ENT.     Medications and allergies reviewed with patient and updated if appropriate.  Patient Active Problem List   Diagnosis Date Noted  . Acute sinus infection 01/22/2016  . Insomnia 09/16/2015  . Low back pain radiating to left leg 06/25/2014  . 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 HIATAL HERNIA WITH REFLUX 07/09/2010  . Vitamin B12 deficiency 03/05/2010  . Esophageal reflux 03/04/2010  . 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 Hypothyroidism 09/05/2007  . Allergic rhinitis 09/05/2007  . Depression 10/21/2006  . Osteopenia 10/21/2006    Current Outpatient Prescriptions on File Prior to Visit  Medication Sig Dispense Refill  . acyclovir (ZOVIRAX) 400 MG tablet One tablet 4 times daily when necessary 50 tablet 3  . albuterol (PROVENTIL HFA;VENTOLIN HFA) 108 (90 Base) MCG/ACT inhaler Inhale 2 puffs into  the lungs every 6 (six) hours as needed. 1 Inhaler 0  . alendronate (FOSAMAX) 10 MG tablet TAKE 1 TABLET(10 MG) BY MOUTH DAILY BEFORE BREAKFAST WITH A FULL GLASS OF WATER AND ON AN EMPTY STOMACH 90 tablet 0  . cyanocobalamin (,VITAMIN B-12,) 1000 MCG/ML injection INJECT 1 ML( 1,000 MCG) UNDER THE SKIN EVERY 30 DAYS 30 mL 0  . EST ESTROGENS-METHYLTEST HS 0.625-1.25 MG per tablet Take 1 tablet by mouth daily. Using 1/3-1/2 Mon, Weds, Friday 30 tablet 12  . levothyroxine (SYNTHROID, LEVOTHROID) 75 MCG tablet Take 1 tablet (75 mcg total) by mouth daily. 90 tablet 3  . MOVIPREP 100 G SOLR Moviprep as directed, no substitutions 1 kit 0  . SAFETY-LOK TB SYR 1CC/25GX5/8" 25G X 5/8" 1 ML MISC USE AS DIRECTED FOR B-12 INJECTIONS. 20 each 3  . sertraline (ZOLOFT) 100 MG tablet Take 1.5 tablets (150 mg total) by mouth daily. 135 tablet 3  . traZODone (DESYREL) 150 MG tablet Take 1 tablet (150 mg total) by mouth at bedtime. 90 tablet 3  . Vitamin D, Ergocalciferol, (DRISDOL) 50000 units CAPS capsule Take 1 capsule (50,000 Units total) by mouth every 30 (thirty) days. 12 capsule 0   No current facility-administered medications on file prior to visit.    Past Medical History  Diagnosis Date  . Osteoporosis   .  Asthma   . Hiatal hernia   . Low vitamin B12 level   . Ovarian cyst     TAH/BSO  . Thyroid dysfunction   . Allergy   . Depression   . 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 IgA deficiency [facility])     Past Surgical History  Procedure Laterality Date  . Broken arm      surgery for this  . Total abdominal hysterectomy w/ bilateral salpingoophorectomy  1999  . Thyroidectomy  2000  . Brow lift  1/15  . Colonoscopy      Social History   Social History  . Marital Status: Significant Other    Spouse Name: N/A  . Number of Children: N/A  . Years of Education: N/A   Social History Main Topics  . Smoking status: Former Research scientist (life sciences)  . Smokeless tobacco: Never Used     Comment: in  college  . Alcohol Use: 6.0 oz/week    10 Standard drinks or equivalent per week     Comment: glass of wine  . Drug Use: No  . Sexual Activity:    Partners: Female    Patent examiner Protection: Surgical     Comment: TAH/BSO   Other Topics Concern  . None   Social History Narrative    Family History  Problem Relation Age of Onset  . COPD Father     chronic lung infections  . Colon polyps Father   . Heart attack Mother   . Osteoporosis Mother   . Breast cancer Mother 4  . Lung cancer Mother   . Colon polyps Mother   . Colon cancer Neg Hx   . Esophageal cancer Neg Hx   . Stomach cancer Neg Hx   . Rectal cancer Neg Hx     Review of Systems See HPI    Objective:   Filed Vitals:   02/06/16 1118  BP: 114/64  Pulse: 68  Temp: 98.1 F (36.7 C)  Resp: 16   Filed Weights   02/06/16 1118  Weight: 110 lb (49.896 kg)   Body mass index is 21.48 kg/(m^2).   Physical Exam GENERAL APPEARANCE: Appears stated age, well appearing, NAD EYES: conjunctiva clear, no icterus HEENT: bilateral tympanic membranes and ear canals normal, oropharynx with mild erythema,  no thyromegaly, trachea midline, no cervical or supraclavicular lymphadenopathy LUNGS: Clear to auscultation without wheeze or crackles, unlabored breathing, good air entry bilaterally HEART: Normal S1,S2 without murmurs EXTREMITIES: Without clubbing, cyanosis, or edema        Assessment & Plan:   See Problem List for Assessment and Plan of chronic medical problems.

## 2016-02-06 NOTE — Progress Notes (Signed)
Pre visit review using our clinic review tool, if applicable. No additional management support is needed unless otherwise documented below in the visit note. 

## 2016-02-06 NOTE — Patient Instructions (Addendum)
  A steroid injection was given. Augmentin was sent to your pharmacy.     Your prescription(s) have been submitted to your pharmacy. Please take as directed and contact our office if you believe you are having problem(s) with the medication(s).

## 2016-02-06 NOTE — Assessment & Plan Note (Signed)
Chronic for 6 months IgA def and in the past was advised surgery may help to open up the sinuses  Has been on 6 antibiotics in the past 6 months Started steroid she had at home and it has helped [facility] tomorrow Will give depo- medrol 80 mg IM x 1 today augmentin prescribed - but she will not take until after she sees ENT to get his opinion F/u as needed

## 2016-02-07 ENCOUNTER — Other Ambulatory Visit (HOSPITAL_COMMUNITY): Payer: Medicare Other

## 2016-02-14 ENCOUNTER — Ambulatory Visit (INDEPENDENT_AMBULATORY_CARE_PROVIDER_SITE_OTHER): Payer: Medicare Other | Admitting: Obstetrics & Gynecology

## 2016-02-14 ENCOUNTER — Encounter: Payer: Self-pay | Admitting: Obstetrics & Gynecology

## 2016-02-14 VITALS — BP 100/56 | HR 68 | Resp 14 | Ht 60.0 in | Wt 110.0 lb

## 2016-02-14 DIAGNOSIS — Z01419 Encounter for gynecological examination (general) (routine) without abnormal findings: Secondary | ICD-10-CM | POA: Diagnosis not present

## 2016-02-14 DIAGNOSIS — M810 Age-related osteoporosis without current pathological fracture: Secondary | ICD-10-CM

## 2016-02-14 MED ORDER — EST ESTROGENS-METHYLTEST 0.625-1.25 MG PO TABS: 1.0000 | ORAL_TABLET | Freq: Every day | ORAL | Status: DC

## 2016-02-14 MED ORDER — ALENDRONATE SODIUM 10 MG PO TABS: 10.0000 mg | ORAL_TABLET | Freq: Every day | ORAL | Status: DC

## 2016-02-14 MED ORDER — VITAMIN D (ERGOCALCIFEROL) 1.25 MG (50000 UNIT) PO CAPS: ORAL_CAPSULE | ORAL | Status: DC

## 2016-02-14 NOTE — Progress Notes (Signed)
70 y.o. G1P1 Significant OtherCaucasianF here for annual exam.  Having shoulder issues.  Has seen Dr. Mardelle Matte who recommended a shoulder replacement.  Yoga has really helped.    Pt had recurrent sinus infections this past year.  H/O IgA deficiency.  She finally got over that but it took several OVs.  Has seen an ENT, Dr. Lucia Gaskins.    Partner was diagnosed with breast cancer in October.  She is doing well.    PCP:  Dr. Quay Burow.  Lab work done in January.   Patient's last menstrual period was 08/24/1997.          Sexually active: Yes.    The current method of family planning is status post hysterectomy.    Exercising: Yes.    yoga; walking Smoker:  no  Health Maintenance: Pap:  ?2004 History of abnormal Pap:  no MMG:  11/19/15 BIRADS1 negative Colonoscopy:  2011-repeat in 5 years; due for colonoscopy.  Pt IS going to have this done before the end of the year BMD:   09/14/12 -on Fosamax TDaP:  2014 Hep C testing: today  Pneumonia: completed both of them Screening Labs: PCP, Hb today: PCP, Urine today: normal   reports that she has quit smoking. She has never used smokeless tobacco. She reports that she drinks about 6.0 oz of alcohol per week. She reports that she does not use illicit drugs.  Past Medical History  Diagnosis Date  . Osteoporosis   . Asthma   . Hiatal hernia   . Low vitamin B12 level   . Ovarian cyst     TAH/BSO  . Thyroid dysfunction   . Allergy   . Depression   . 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 IgA deficiency [facility])     Past Surgical History  Procedure Laterality Date  . Broken arm      surgery for this  . Total abdominal hysterectomy w/ bilateral salpingoophorectomy  1999  . Thyroidectomy  2000  . Brow lift  1/15  . Colonoscopy      Current Outpatient Prescriptions  Medication Sig Dispense Refill  . acyclovir (ZOVIRAX) 400 MG tablet One tablet 4 times daily when necessary 50 tablet 3  . albuterol (PROVENTIL HFA;VENTOLIN HFA) 108 (90 Base)  MCG/ACT inhaler Inhale 2 puffs into the lungs every 6 (six) hours as needed. 1 Inhaler 0  . alendronate (FOSAMAX) 10 MG tablet TAKE 1 TABLET(10 MG) BY MOUTH DAILY BEFORE BREAKFAST WITH A FULL GLASS OF WATER AND ON AN EMPTY STOMACH 90 tablet 0  . amoxicillin-clavulanate (AUGMENTIN) 875-125 MG tablet Take 1 tablet by mouth 2 (two) times daily. 28 tablet 0  . cyanocobalamin (,VITAMIN B-12,) 1000 MCG/ML injection INJECT 1 ML( 1,000 MCG) UNDER THE SKIN EVERY 30 DAYS 30 mL 0  . EST ESTROGENS-METHYLTEST HS 0.625-1.25 MG per tablet Take 1 tablet by mouth daily. Using 1/3-1/2 Mon, Weds, Friday 30 tablet 12  . levothyroxine (SYNTHROID, LEVOTHROID) 75 MCG tablet Take 1 tablet (75 mcg total) by mouth daily. 90 tablet 3  . MOVIPREP 100 G SOLR Moviprep as directed, no substitutions 1 kit 0  . SAFETY-LOK TB SYR 1CC/25GX5/8" 25G X 5/8" 1 ML MISC USE AS DIRECTED FOR B-12 INJECTIONS. 20 each 3  . sertraline (ZOLOFT) 100 MG tablet Take 1.5 tablets (150 mg total) by mouth daily. 135 tablet 3  . traZODone (DESYREL) 150 MG tablet Take 1 tablet (150 mg total) by mouth at bedtime. 90 tablet 3  .  Vitamin D, Ergocalciferol, (DRISDOL) 50000 units CAPS capsule Take 1 capsule (50,000 Units total) by mouth every 30 (thirty) days. 12 capsule 0   No current facility-administered medications for this visit.    Family History  Problem Relation Age of Onset  . COPD Father     chronic lung infections  . Colon polyps Father   . Heart attack Mother   . Osteoporosis Mother   . Breast cancer Mother 60  . Lung cancer Mother   . Colon polyps Mother   . Colon cancer Neg Hx   . Esophageal cancer Neg Hx   . Stomach cancer Neg Hx   . Rectal cancer Neg Hx     ROS:  Pertinent items are noted in HPI.  Otherwise, a comprehensive ROS was negative.  Exam:   BP 100/56 mmHg  Pulse 68  Resp 14  Ht 5' (1.524 m)  Wt 110 lb (49.896 kg)  BMI 21.48 kg/m2  LMP 08/24/1997   Height: 5' (152.4 cm)  Ht Readings from Last 3 Encounters:   02/14/16 5' (1.524 m)  01/13/16 5' (1.524 m)  11/09/15 5' (1.524 m)   General appearance: alert, cooperative and appears stated age Head: Normocephalic, without obvious abnormality, atraumatic Neck: no adenopathy, supple, symmetrical, trachea midline and thyroid normal to inspection and palpation Lungs: clear to auscultation bilaterally Breasts: normal appearance, no masses or tenderness Heart: regular rate and rhythm Abdomen: soft, non-tender; bowel sounds normal; no masses,  no organomegaly Extremities: extremities normal, atraumatic, no cyanosis or edema Skin: Skin color, texture, turgor normal. No rashes or lesions Lymph nodes: Cervical, supraclavicular, and axillary nodes normal. No abnormal inguinal nodes palpated Neurologic: Grossly normal   Pelvic: External genitalia:  no lesions              Urethra:  normal appearing urethra with no masses, tenderness or lesions              Bartholins and Skenes: normal                 Vagina: normal appearing vagina with normal color and discharge, no lesions              Cervix: absent              Pap taken: No. Bimanual Exam:  Uterus:  uterus absent              Adnexa: no mass, fullness, tenderness               Rectovaginal: Confirms               Anus:  normal sphincter tone, no lesions  Chaperone was present for exam.  A:  Well Woman with normal exam H/O TAH/BSO, on low dosed HRT Osteopenia Decreased libido  P: Mammogram yearly On Fosamax 73m daily. #90/4RF.  Pt reminded to do a BMD with her MMG.  Order placed. Vit D 50K monthly.  Rx to pharmacy. pap smear not indicated Estratest HS. Pt is taking 1/3 tab on Mon/Wed/Fri.  #30/12 RF.  Desires to continue.  Risks of breast cancer, stroke, MI, DVT/PE reviewed. Still desires to continue. Return annually or prn

## 2016-02-14 NOTE — Patient Instructions (Addendum)
Dr. Durward Fortes  Acyclovir 400mg  three times daily for 5 days  Please scheduled your bone density when you do your MMG next year.  Order has been placed.

## 2016-02-18 ENCOUNTER — Inpatient Hospital Stay: Admit: 2016-02-18 | Payer: Medicare Other | Admitting: Orthopedic Surgery

## 2016-02-18 ENCOUNTER — Other Ambulatory Visit: Payer: Self-pay | Admitting: Otolaryngology

## 2016-02-18 DIAGNOSIS — R51 Headache: Secondary | ICD-10-CM

## 2016-02-18 DIAGNOSIS — R519 Headache, unspecified: Secondary | ICD-10-CM

## 2016-02-18 DIAGNOSIS — R0981 Nasal congestion: Secondary | ICD-10-CM

## 2016-02-18 SURGERY — ARTHROPLASTY, SHOULDER, TOTAL
Anesthesia: General | Laterality: Right

## 2016-02-26 ENCOUNTER — Other Ambulatory Visit: Payer: Medicare Other

## 2016-02-26 ENCOUNTER — Encounter: Payer: Self-pay | Admitting: Internal Medicine

## 2016-02-26 ENCOUNTER — Other Ambulatory Visit: Payer: Self-pay | Admitting: Internal Medicine

## 2016-02-26 ENCOUNTER — Ambulatory Visit
Admission: RE | Admit: 2016-02-26 | Discharge: 2016-02-26 | Disposition: A | Discharge status: Home / Self Care | Payer: Medicare Other | Source: Ambulatory Visit | Attending: Otolaryngology | Admitting: Otolaryngology

## 2016-02-26 DIAGNOSIS — R519 Headache, unspecified: Secondary | ICD-10-CM

## 2016-02-26 DIAGNOSIS — R0981 Nasal congestion: Secondary | ICD-10-CM

## 2016-02-26 DIAGNOSIS — R51 Headache: Secondary | ICD-10-CM

## 2016-02-26 DIAGNOSIS — R930 Abnormal findings on diagnostic imaging of skull and head, not elsewhere classified: Secondary | ICD-10-CM

## 2016-02-27 NOTE — Telephone Encounter (Signed)
Patient called about this email she sent. Can you please follow up with her. Thank you.

## 2016-02-27 NOTE — Telephone Encounter (Signed)
PLease advise

## 2016-03-03 ENCOUNTER — Telehealth: Payer: Self-pay | Admitting: Internal Medicine

## 2016-03-03 DIAGNOSIS — R930 Abnormal findings on diagnostic imaging of skull and head, not elsewhere classified: Secondary | ICD-10-CM

## 2016-03-03 NOTE — Telephone Encounter (Signed)
Is requesting referral to Dr. Angelene Giovanni with [facility] for tumor found on CT that Dr. Quay Burow ordered.  Dr. Salomon Fick is requesting notes for last year along with labs and CT.  Please mark as urgent.

## 2016-03-03 NOTE — Telephone Encounter (Signed)
Referral ordered. Please send info

## 2016-03-04 ENCOUNTER — Ambulatory Visit (HOSPITAL_COMMUNITY)
Admission: RE | Admit: 2016-03-04 | Discharge: 2016-03-04 | Disposition: A | Discharge status: Home / Self Care | Payer: Medicare Other | Source: Ambulatory Visit | Attending: Internal Medicine | Admitting: Internal Medicine

## 2016-03-04 ENCOUNTER — Encounter: Payer: Self-pay | Admitting: Internal Medicine

## 2016-03-04 DIAGNOSIS — G930 Cerebral cysts: Secondary | ICD-10-CM | POA: Diagnosis not present

## 2016-03-04 DIAGNOSIS — R930 Abnormal findings on diagnostic imaging of skull and head, not elsewhere classified: Secondary | ICD-10-CM | POA: Diagnosis present

## 2016-03-04 NOTE — Telephone Encounter (Signed)
Patient states she had an MRI at [facility] this morning.  Once results are back she would like faxed as well.

## 2016-03-04 NOTE — Telephone Encounter (Signed)
Referral and CT were faxed this morning to [facility]. They will contact pt.

## 2016-03-04 NOTE — Telephone Encounter (Signed)
Printed and given the [facility].

## 2016-03-05 NOTE — Telephone Encounter (Signed)
MRI and CT results faxed to [facility] and [facility]. Pt aware.

## 2016-03-18 ENCOUNTER — Other Ambulatory Visit: Payer: Self-pay | Admitting: Obstetrics and Gynecology

## 2016-04-01 ENCOUNTER — Encounter: Payer: Self-pay | Admitting: Gastroenterology

## 2016-04-29 ENCOUNTER — Ambulatory Visit (INDEPENDENT_AMBULATORY_CARE_PROVIDER_SITE_OTHER): Payer: Medicare Other | Admitting: Internal Medicine

## 2016-04-29 ENCOUNTER — Encounter: Payer: Self-pay | Admitting: Internal Medicine

## 2016-04-29 VITALS — BP 112/62 | HR 72 | Temp 98.2°F | Resp 16 | Wt 112.0 lb

## 2016-04-29 DIAGNOSIS — B372 Candidiasis of skin and nail: Secondary | ICD-10-CM | POA: Insufficient documentation

## 2016-04-29 DIAGNOSIS — Q046 Congenital cerebral cysts: Secondary | ICD-10-CM | POA: Insufficient documentation

## 2016-04-29 NOTE — Assessment & Plan Note (Signed)
Improved - rash in perineum region has resolved - continue loose clothing and trying to avoid keeping that area moist otc anti-fungal cream if needed Lesion in vulvar region looks like a slight scratch - non-tender, no discharge or erythema - continue tea tree oil which seems to be helping Call with questions or concerns.

## 2016-04-29 NOTE — Patient Instructions (Addendum)
Continue your current over-the-counter treatments.  If your symptoms do not continue to improve please call.

## 2016-04-29 NOTE — Progress Notes (Signed)
Pre visit review using our clinic review tool, if applicable. No additional management support is needed unless otherwise documented below in the visit note. 

## 2016-04-29 NOTE — Progress Notes (Signed)
Subjective:    Patient ID: Jo Gomez, female    DOB: 1946-02-20, 70 y.o.   MRN: [id number]  HPI She is here for an acute visit.   She has a sore in her genital region.  It has been there about one month.  She has also had a rash in her perineum region that she felt was secondary to increased moisture.  She exercises regularly and will often exercise for a couple of hours and then shower.  The rash was itching and burning.  She had slightly bleeding from the sore in her genital region from irritation, but no discharge.  She applied teat tree oil, desitin and neosporin.  She thinks the tea tree oil has helped and it is getting better.  The sore barely hurts now and the rash has resolved.  After showering she wears loose clothing and that helps.   She has had rectal bleeding recently which she thinks is from loose stools.  The blood was on the tissue, not in the stool.  She has a colonoscopy scheduled for 9/27.  Her bowels have been loose on and off.  She has decreased her fruit intake and it has helped a little.    Medications and allergies reviewed with patient and updated if appropriate.  Patient Active Problem List   Diagnosis Date Noted  . Chronic frontal sinusitis 02/06/2016  . Acute sinus infection 01/22/2016  . Insomnia 09/16/2015  . Low back pain radiating to left leg 06/25/2014  . 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 HIATAL HERNIA WITH REFLUX 07/09/2010  . Vitamin B12 deficiency 03/05/2010  . Esophageal reflux 03/04/2010  . 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 Hypothyroidism 09/05/2007  . Allergic rhinitis 09/05/2007  . Depression 10/21/2006  . Osteopenia 10/21/2006    Current Outpatient Prescriptions on File Prior to Visit  Medication Sig Dispense Refill  . acyclovir (ZOVIRAX) 400 MG tablet One tablet 4 times daily when necessary 50 tablet 3  . albuterol (PROVENTIL HFA;VENTOLIN HFA) 108 (90 Base) MCG/ACT inhaler Inhale 2 puffs  into the lungs every 6 (six) hours as needed. 1 Inhaler 0  . alendronate (FOSAMAX) 10 MG tablet Take 1 tablet (10 mg total) by mouth daily before breakfast. Take with a full glass of water on an empty stomach. 90 tablet 4  . cyanocobalamin (,VITAMIN B-12,) 1000 MCG/ML injection INJECT 1 ML( 1,000 MCG) UNDER THE SKIN EVERY 30 DAYS 30 mL 0  . estrogen-methylTESTOSTERone (EST ESTROGENS-METHYLTEST HS) 0.625-1.25 MG tablet Take 1 tablet by mouth daily. 30 tablet 5  . levothyroxine (SYNTHROID, LEVOTHROID) 75 MCG tablet Take 1 tablet (75 mcg total) by mouth daily. 90 tablet 3  . MOVIPREP 100 G SOLR Moviprep as directed, no substitutions 1 kit 0  . SAFETY-LOK TB SYR 1CC/25GX5/8" 25G X 5/8" 1 ML MISC USE AS DIRECTED FOR B-12 INJECTIONS. 20 each 3  . sertraline (ZOLOFT) 100 MG tablet Take 1.5 tablets (150 mg total) by mouth daily. 135 tablet 3  . traZODone (DESYREL) 150 MG tablet Take 1 tablet (150 mg total) by mouth at bedtime. 90 tablet 3  . Vitamin D, Ergocalciferol, (DRISDOL) 50000 units CAPS capsule Take 1 tab as directed 12 capsule 0   No current facility-administered medications on file prior to visit.     Past Medical History:  Diagnosis Date  . Allergy   . Arthritis    arthritic cysts in back per pat  . Asthma   . Depression   .  Hiatal hernia   . IgA deficiency ([place])   . Low vitamin B12 level   . Osteoporosis   . Ovarian cyst    TAH/BSO  . Thyroid dysfunction     Past Surgical History:  Procedure Laterality Date  . broken arm     surgery for this  . BROW LIFT  1/15  . COLONOSCOPY    . THYROIDECTOMY  2000  . TOTAL ABDOMINAL HYSTERECTOMY W/ BILATERAL SALPINGOOPHORECTOMY  1999    Social History   Social History  . Marital status: Significant Other    Spouse name: N/A  . Number of children: N/A  . Years of education: N/A   Social History Main Topics  . Smoking status: Former Research scientist (life sciences)  . Smokeless tobacco: Never Used     Comment: in college  . Alcohol use 6.0 oz/week     10 Standard drinks or equivalent per week     Comment: glass of wine  . Drug use: No  . Sexual activity: Yes    Partners: Female    Birth control/ protection: Surgical     Comment: TAH/BSO   Other Topics Concern  . Not on file   Social History Narrative  . No narrative on file    Family History  Problem Relation Age of Onset  . COPD Father     chronic lung infections  . Colon polyps Father   . Heart attack Mother   . Osteoporosis Mother   . Breast cancer Mother 47  . Lung cancer Mother   . Colon polyps Mother   . Colon cancer Neg Hx   . Esophageal cancer Neg Hx   . Stomach cancer Neg Hx   . Rectal cancer Neg Hx     Review of Systems  Constitutional: Negative for fever.  Gastrointestinal: Positive for anal bleeding. Negative for abdominal pain, nausea and rectal pain.       Loose stools  Genitourinary: Negative for dysuria and hematuria.  Skin: Positive for rash.       Objective:   Vitals:   04/29/16 0940  BP: 112/62  Pulse: 72  Resp: 16  Temp: 98.2 F (36.8 C)   Filed Weights   04/29/16 0940  Weight: 112 lb (50.8 kg)   Body mass index is 21.87 kg/m.   Physical Exam  Constitutional: She appears well-developed and well-nourished. No distress.  Genitourinary:  Genitourinary Comments: No erythema or rash in vulvar area, minor skin break left inner vulva region without surrounding erythema, swelling or discharge, nontender, no ulcers or blisters  Skin: Skin is warm and dry. No rash noted. She is not diaphoretic. No erythema.          Assessment & Plan:   See Problem List for Assessment and Plan of chronic medical problems.

## 2016-05-13 ENCOUNTER — Ambulatory Visit: Payer: Medicare Other | Admitting: *Deleted

## 2016-05-13 VITALS — Ht 60.0 in | Wt 108.4 lb

## 2016-05-13 DIAGNOSIS — Z8371 Family history of colonic polyps: Secondary | ICD-10-CM

## 2016-05-13 MED ORDER — SUPREP BOWEL PREP KIT 17.5-3.13-1.6 GM/177ML PO SOLN: 1.0000 | Freq: Once | ORAL | 0 refills | Status: AC

## 2016-05-13 NOTE — Progress Notes (Signed)
Patient denies any allergies to egg or soy products. Patient denies complications with anesthesia/sedation.  Patient denies oxygen use at home and denies diet medications. Emmi instructions for colonoscopy but patient denied.   

## 2016-05-14 ENCOUNTER — Encounter: Payer: Self-pay | Admitting: Gastroenterology

## 2016-05-17 ENCOUNTER — Other Ambulatory Visit: Payer: Self-pay | Admitting: Obstetrics & Gynecology

## 2016-05-18 ENCOUNTER — Telehealth: Payer: Self-pay | Admitting: Gastroenterology

## 2016-05-18 NOTE — Telephone Encounter (Signed)
Pt calling states she has an arthritic cyst in her back that is causing increased pain, sciatic pain down her leg- she is on oxycodone and muscle relaxants and now having constipation due to these meds and she doesn't think she can do her colon wed. 9-27 due to this and wants to RS.  Rs her for 10-16 Monday at 9 am with dr stark.  Will print and mail new instructions, confirmed address. Instructed her to call and RS if she has continued issues   Lelan Pons PV

## 2016-05-18 NOTE — Telephone Encounter (Signed)
Medication refill request: Vitamin D  Last AEX:  11-29-14  Last OV: 02-14-16  Next AEX: 05-21-17 Last MMG (if hormonal medication request): 11-19-15 WNL  Refill authorized: please advise

## 2016-05-20 ENCOUNTER — Encounter: Payer: Medicare Other | Admitting: Gastroenterology

## 2016-06-08 ENCOUNTER — Encounter: Payer: Medicare Other | Admitting: Gastroenterology

## 2016-06-19 ENCOUNTER — Encounter: Payer: Self-pay | Admitting: Family

## 2016-06-19 ENCOUNTER — Ambulatory Visit (INDEPENDENT_AMBULATORY_CARE_PROVIDER_SITE_OTHER): Payer: Medicare Other | Admitting: Family

## 2016-06-19 DIAGNOSIS — M7520 Bicipital tendinitis, unspecified shoulder: Secondary | ICD-10-CM | POA: Insufficient documentation

## 2016-06-19 DIAGNOSIS — M7522 Bicipital tendinitis, left shoulder: Secondary | ICD-10-CM

## 2016-06-19 NOTE — Assessment & Plan Note (Signed)
Symptoms and exam consistent with biceps tendinitis although there is concern for possible subluxating biceps tendon. Treat conservatively with ice, home exercise therapy, and stretching. In office injection of cortisone provided. Refer to orthopedics for further assessment and treatment if indicated. Follow-up if symptoms worsen or fail to improve.

## 2016-06-19 NOTE — Progress Notes (Signed)
Subjective:    Patient ID: Jo Gomez, female    DOB: Apr 13, 1946, 70 y.o.   MRN: [id number]  Chief Complaint  Patient presents with  . Shoulder Pain    left shoulder pain, x3 weeks, when turning it a certain way she can hear it pop and then a shooting pain down her left arm, does not remember doing anything to cause it    HPI:  Jo Gomez is a 70 y.o. 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 and presents today for an acute office visit.  This is a new problem. Associated symptom of pain located in her left shoulder has been going on for proximally 3 weeks. Denies any trauma. She is right-hand dominant. Pain is described as sharp pains that go throughout the shoulder. Aggrevated by external rotation. Modifying factors include Motrin and Aleve which have somewhat helped. She is able to function and has restriction in motion. No neck pain.   Allergies  Allergen Reactions  . Clarithromycin     Severe leg and muscle cramps  . Erythromycin Base Nausea And Vomiting  . Levaquin [Levofloxacin In D5w]     tendonitis  . Codeine Rash    Syrup only, can take hydrocodone, oxycodone  . Sulfonamide Derivatives Rash  . Tetracycline Rash      Outpatient Medications Prior to Visit  Medication Sig Dispense Refill  . acyclovir (ZOVIRAX) 400 MG tablet One tablet 4 times daily when necessary 50 tablet 3  . albuterol (PROVENTIL HFA;VENTOLIN HFA) 108 (90 Base) MCG/ACT inhaler Inhale 2 puffs into the lungs every 6 (six) hours as needed. 1 Inhaler 0  . alendronate (FOSAMAX) 10 MG tablet Take 1 tablet (10 mg total) by mouth daily before breakfast. Take with a full glass of water on an empty stomach. 90 tablet 4  . cyanocobalamin (,VITAMIN B-12,) 1000 MCG/ML  injection INJECT 1 ML( 1,000 MCG) UNDER THE SKIN EVERY 30 DAYS 30 mL 0  . estrogen-methylTESTOSTERone (EST ESTROGENS-METHYLTEST HS) 0.625-1.25 MG tablet Take 1 tablet by mouth daily. 30 tablet 5  . fluticasone (FLONASE) 50 MCG/ACT nasal spray USE 2 SPRAYS IN EACH NOSTRIL QD AT NIGHT  5  . levothyroxine (SYNTHROID, LEVOTHROID) 75 MCG tablet Take 1 tablet (75 mcg total) by mouth daily. 90 tablet 3  . SAFETY-LOK TB SYR 1CC/25GX5/8" 25G X 5/8" 1 ML MISC USE AS DIRECTED FOR B-12 INJECTIONS. 20 each 3  . sertraline (ZOLOFT) 100 MG tablet Take 1.5 tablets (150 mg total) by mouth daily. 135 tablet 3  . traZODone (DESYREL) 150 MG tablet Take 1 tablet (150 mg total) by mouth at bedtime. 90 tablet 3  . Vitamin D, Ergocalciferol, (DRISDOL) 50000 units CAPS capsule TAKE 1 CAPSULE BY MOUTH AS DIRECTED 12 capsule 0   No facility-administered medications prior to visit.       Past Surgical History:  Procedure Laterality Date  . broken arm     surgery for this  . BROW LIFT  1/15  . COLONOSCOPY    . THYROIDECTOMY  2000  . TOTAL ABDOMINAL HYSTERECTOMY W/ BILATERAL SALPINGOOPHORECTOMY  1999      Past Medical History:  Diagnosis Date  . Allergy   . Anxiety   . 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 Asthma   . Asthma   . Depression   .  GERD (gastroesophageal reflux disease)    diet controlled, no med  . History of endoscopy   . HSV infection   . 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 Low vitamin B12 level   . Osteoporosis   . Ovarian cyst    TAH/BSO  . Thyroid dysfunction       Review of Systems  Constitutional: Negative for chills and fever.  Musculoskeletal:       Positive for left shoulder pain  Neurological: Negative for weakness and numbness.      Objective:    BP 132/76 (BP Location: Left Arm, Patient Position: Sitting, Cuff Size: Normal)   Pulse 67   Temp 97.9 F (36.6 C) (Oral)   Resp 16   Ht 5' (1.524 m)   Wt 108 lb (49 kg)   LMP 08/24/1997   SpO2 97%   BMI 21.09  kg/m  Nursing note and vital signs reviewed.  Physical Exam  Constitutional: She is oriented to person, place, and time. She appears well-developed and well-nourished. No distress.  Cardiovascular: Normal rate, regular rhythm, normal heart sounds and intact distal pulses.   Pulmonary/Chest: Effort normal and breath sounds normal.  Musculoskeletal:  Left shoulder - no obvious deformity, discoloration, or edema noted. Palpable tenderness over anterior aspect of shoulder and biceps tendon. There is also tenderness of the biceps brachii with no crepitus or deformity noted range of motion slightly limited secondary to pain strength is normal. Positive Yeargason's. Distal pulses and sensation are intact and appropriate.   Neurological: She is alert and oriented to person, place, and time.  Skin: Skin is warm and dry.  Psychiatric: She has a normal mood and affect. Her behavior is normal. Judgment and thought content normal.   Procedure: Corticosteroid injection of the shoulder  Description: Informed consent was obtained with discussion including risks and benefits of the procedure. Patient verbally wished to continued. Time out was performed. The left shoulder was marked and identified. It was cleansed with betadine using a concentric circular pattern. Skin anesthesia was applied using cold spray applied for 10 seconds. Injection of 1 ml:4 ml of Kenalog(40 mg/ml) to Sensoricaine was injected following aspiration with no return noted. Following the injection there was instant relief confirming appropriate placement. A bandage was applied to the area and post care instructions were provided. The procedure was tolerated well with no complications.       Assessment & Plan:   Problem List Items Addressed This Visit      Musculoskeletal and Integument   Biceps tendonitis    Symptoms and exam consistent with biceps tendinitis although there is concern for possible subluxating biceps tendon. Treat  conservatively with ice, home exercise therapy, and stretching. In office injection of cortisone provided. Refer to orthopedics for further assessment and treatment if indicated. Follow-up if symptoms worsen or fail to improve.       Other Visit Diagnoses   None.      I am having Ms. Muratore maintain her acyclovir, cyanocobalamin, traZODone, levothyroxine, sertraline, albuterol, SAFETY-LOK TB SYR 1CC/25GX5/8", estrogen-methylTESTOSTERone, alendronate, fluticasone, and Vitamin D (Ergocalciferol).   Follow-up: Return if symptoms worsen or fail to improve.  Mauricio Po, FNP

## 2016-06-19 NOTE — Patient Instructions (Signed)
Thank you for choosing [facility]!  Ice / x 20 minutes every 2 hours and as needed or following activity   Exercises 1-2 times per day as instructed.    If your symptoms worsen or fail to improve, please contact our office for further instruction, or in case of emergency go directly to the emergency room at the closest medical facility.    Biceps Tendon Subluxation Biceps tendon subluxation is an injury to the shoulder area. The biceps muscle is located on the front side of the upper arm. When this muscle contracts, it causes the forearm to bend at the elbow joint. Biceps muscle contraction also assists in raising the arm at the shoulder joint. The place on a muscle where it attaches to a bone is called a tendon. Biceps muscles have three tendons. One tendon attaches at the elbow, and the other two tendons attach at the shoulder. One of the tendons that attaches at the shoulder runs through a groove in the bone before it attaches at the shoulder. Biceps tendon subluxation occurs when the tendon moves out of this groove during arm movement. This injury often occurs along with other problems in the shoulder. CAUSES Biceps tendon subluxation can happen after an injury to the group of muscles and tendons that surround the shoulder joint (rotator cuff). Damage to the rotator cuff is often caused by strenuous or repetitive movements of the shoulder joint when the arm is extended away from the body and raised overhead. It can also occur with sudden, forceful movements in those directions. RISK FACTORS This injury is more likely to occur in:  People who participate in contact sports, throwing sports, weight lifting, and bodybuilding.  People who perform heavy labor, especially involving lifting and overhead work.  People who have poor strength and flexibility.  People who do not warm up properly before practice or play. SYMPTOMS Symptoms of this injury include:  A crackling, clicking, or  popping sound that happens when the arm is moved away from the body and the shoulder is rotated outward. You may or may not have pain during this movement.  Pain and tenderness in the front of the shoulder.  Pain that increases with shoulder and elbow motion, such as bending the elbow and turning your palm upward against resistance. DIAGNOSIS This injury can be difficult to diagnose because the symptoms are similar to those of many other upper arm and shoulder problems. The injury may be diagnosed based on:  Your symptoms and medical history. Your health care provider will ask for details about your injury and ask about activities that make your symptoms worse.  A physical exam.  Imaging tests such as:  X-rays.  CT scan.  Ultrasound. TREATMENT Initial treatment for this injury may include:  Medicines to control pain.  Icing the injured area to reduce swelling (inflammation). You may also need to work with a physical therapist to strengthen and stabilize the injured area. In severe cases, surgery may be needed to put the biceps tendon back into the proper position and to prevent the injury from happening again. If you have other injuries to your shoulder, those may be repaired during surgery as well. HOME CARE INSTRUCTIONS  Take over-the-counter and prescription medicines only as told by your health care provider.  Do not drive or operate heavy machinery while taking prescription pain medicine.  If directed, apply ice to the injured area:  Put ice in a plastic bag.  Place a towel between your skin and the  bag.  Leave the ice on for 20 minutes, 2-3 times per day.  Return to your normal activities as told by your health care provider. Ask your health care provider what activities are safe for you.  Begin exercising the injured area as told by your health care provider or physical therapist.  Keep all follow-up visits as told by your health care provider. This is  important. SEEK MEDICAL CARE IF:  Your symptoms get worse or they do not improve with your treatment plan.  You have new or unexplained symptoms, such as pain, tingling, or numbness in or near the injured area.   This information is not intended to replace advice given to you by your health care provider. Make sure you discuss any questions you have with your health care provider.   Document Released: 08/10/2005 Document Revised: 05/01/2015 Document Reviewed: 10/11/2014 Elsevier Interactive Patient Education [employer].

## 2016-06-21 ENCOUNTER — Other Ambulatory Visit: Payer: Self-pay | Admitting: Family Medicine

## 2016-06-22 ENCOUNTER — Ambulatory Visit: Payer: Medicare Other | Admitting: Internal Medicine

## 2016-06-24 DIAGNOSIS — M25512 Pain in left shoulder: Secondary | ICD-10-CM

## 2016-06-24 DIAGNOSIS — M25511 Pain in right shoulder: Secondary | ICD-10-CM | POA: Insufficient documentation

## 2016-07-15 ENCOUNTER — Ambulatory Visit (AMBULATORY_SURGERY_CENTER): Payer: Medicare Other | Admitting: Gastroenterology

## 2016-07-15 ENCOUNTER — Encounter: Payer: Self-pay | Admitting: Gastroenterology

## 2016-07-15 VITALS — BP 121/63 | HR 60 | Temp 98.4°F | Resp 17 | Ht 60.0 in | Wt 108.0 lb

## 2016-07-15 DIAGNOSIS — Z1212 Encounter for screening for malignant neoplasm of rectum: Secondary | ICD-10-CM

## 2016-07-15 DIAGNOSIS — Z1211 Encounter for screening for malignant neoplasm of colon: Secondary | ICD-10-CM | POA: Diagnosis present

## 2016-07-15 DIAGNOSIS — Z8371 Family history of colonic polyps: Secondary | ICD-10-CM | POA: Diagnosis not present

## 2016-07-15 DIAGNOSIS — Z83719 Family history of colon polyps, unspecified: Secondary | ICD-10-CM

## 2016-07-15 MED ORDER — SODIUM CHLORIDE 0.9 % IV SOLN: 500.0000 mL | INTRAVENOUS | Status: DC

## 2016-07-15 NOTE — Progress Notes (Signed)
Report to PACU, RN, vss, BBS= Clear.  

## 2016-07-15 NOTE — Patient Instructions (Signed)
YOU HAD AN ENDOSCOPIC PROCEDURE TODAY AT THE [facility]:   Refer to the procedure report that was given to you for any specific questions about what was found during the examination.  If the procedure report does not answer your questions, please call your gastroenterologist to clarify.  If you requested that your care partner not be given the details of your procedure findings, then the procedure report has been included in a sealed envelope for you to review at your convenience later.  YOU SHOULD EXPECT: Some feelings of bloating in the abdomen. Passage of more gas than usual.  Walking can help get rid of the air that was put into your GI tract during the procedure and reduce the bloating. If you had a lower endoscopy (such as a colonoscopy or flexible sigmoidoscopy) you may notice spotting of blood in your stool or on the toilet paper. If you underwent a bowel prep for your procedure, you may not have a normal bowel movement for a few days.  Please Note:  You might notice some irritation and congestion in your nose or some drainage.  This is from the oxygen used during your procedure.  There is no need for concern and it should clear up in a day or so.  SYMPTOMS TO REPORT IMMEDIATELY:   Following lower endoscopy (colonoscopy or flexible sigmoidoscopy):  Excessive amounts of blood in the stool  Significant tenderness or worsening of abdominal pains  Swelling of the abdomen that is new, acute  Fever of 100F or higher  For urgent or emergent issues, a gastroenterologist can be reached at any hour by calling [phone].   DIET:  We do recommend a small meal at first, but then you may proceed to your regular diet.  Drink plenty of fluids but you should avoid alcoholic beverages for 24 hours.  ACTIVITY:  You should plan to take it easy for the rest of today and you should NOT DRIVE or use heavy machinery until tomorrow (because of the sedation medicines used during the test).     FOLLOW UP: Our staff will call the number listed on your records the next business day following your procedure to check on you and address any questions or concerns that you may have regarding the information given to you following your procedure. If we do not reach you, we will leave a message.  However, if you are feeling well and you are not experiencing any problems, there is no need to return our call.  We will assume that you have returned to your regular daily activities without incident.  If any biopsies were taken you will be contacted by phone or by letter within the next 1-3 weeks.  Please call us at [phone] if you have not heard about the biopsies in 3 weeks.    SIGNATURES/CONFIDENTIALITY: You and/or your care partner have signed paperwork which will be entered into your electronic medical record.  These signatures attest to the fact that that the information above on your After Visit Summary has been reviewed and is understood.  Full responsibility of the confidentiality of this discharge information lies with you and/or your care-partner. 

## 2016-07-15 NOTE — Op Note (Signed)
[facility] Patient Name: Jo Gomez Procedure Date: 07/15/2016 10:33 AM MRN: [id number] Endoscopist: Ladene Artist , MD Age: 70 Referring MD:  Date of Birth: 10/01/1945 Gender: Female Account #: [id number] Procedure:                Colonoscopy Indications:              Colon cancer screening in patient at increased                            risk: Family history of colon polyps in multiple                            1st-degree relatives Medicines:                Monitored Anesthesia Care Procedure:                Pre-Anesthesia Assessment:                           - Prior to the procedure, a History and Physical                            was performed, and patient medications and                            allergies were reviewed. The patient's tolerance of                            previous anesthesia was also reviewed. The risks                            and benefits of the procedure and the sedation                            options and risks were discussed with the patient.                            All questions were answered, and informed consent                            was obtained. Prior Anticoagulants: The patient has                            taken no previous anticoagulant or antiplatelet                            agents. ASA Grade Assessment: II - A patient with                            mild systemic disease. After reviewing the risks                            and benefits, the patient was deemed in  satisfactory condition to undergo the procedure.                           After obtaining informed consent, the colonoscope                            was passed under direct vision. Throughout the                            procedure, the patient's blood pressure, pulse, and                            oxygen saturations were monitored continuously. The                            Model PCF-H190L [id number]) scope was  introduced                            through the anus and advanced to the the cecum,                            identified by appendiceal orifice and ileocecal                            valve. The ileocecal valve, appendiceal orifice,                            and rectum were photographed. The quality of the                            bowel preparation was excellent. The patient                            tolerated the procedure well. The colonoscopy was                            somewhat difficult due to a tortuous colon.                            Successful completion of the procedure was aided by                            withdrawing and reinserting the scope. Scope In: 10:44:56 AM Scope Out: 11:02:06 AM Scope Withdrawal Time: 0 hours 10 minutes 30 seconds  Total Procedure Duration: 0 hours 17 minutes 10 seconds  Findings:                 The perianal and digital rectal examinations were                            normal.                           The entire examined colon appeared normal on direct  and retroflexion views. Complications:            No immediate complications. Estimated blood loss:                            None. Estimated Blood Loss:     Estimated blood loss: none. Impression:               - The entire examined colon is normal on direct and                            retroflexion views.                           - No specimens collected. Recommendation:           - Repeat colonoscopy in 5 years for screening                            purposes.                           - Patient has a contact number available for                            emergencies. The signs and symptoms of potential                            delayed complications were discussed with the                            patient. Return to normal activities tomorrow.                            Written discharge instructions were provided to the                             patient.                           - Resume previous diet.                           - Continue present medications. Ladene Artist, MD 07/15/2016 11:05:27 AM This report has been signed electronically.

## 2016-07-20 ENCOUNTER — Telehealth: Payer: Self-pay

## 2016-07-20 NOTE — Telephone Encounter (Signed)
  Follow up Call-  Call back number 07/15/2016  Post procedure Call Back phone  # [phone]  Permission to leave phone message Yes  Some recent data might be hidden    Patient was called for follow up after her procedure on 07/15/2016. No answer at the number given for follow up phone call. A message was left on the answering machine.

## 2016-07-20 NOTE — Telephone Encounter (Signed)
  Follow up Call-  Call back number 07/15/2016  Post procedure Call Back phone  # [phone]  Permission to leave phone message Yes  Some recent data might be hidden     Patient questions:  Do you have a fever, pain , or abdominal swelling? No. Pain Score  0 *  Have you tolerated food without any problems? Yes.    Have you been able to return to your normal activities? Yes.    Do you have any questions about your discharge instructions: Diet   No. Medications  No. Follow up visit  No.  Do you have questions or concerns about your Care? No.  Actions: * If pain score is 4 or above: No action needed, pain <4.

## 2016-07-24 ENCOUNTER — Ambulatory Visit: Payer: Medicare Other | Admitting: Internal Medicine

## 2016-07-28 ENCOUNTER — Ambulatory Visit (INDEPENDENT_AMBULATORY_CARE_PROVIDER_SITE_OTHER): Payer: Medicare Other | Admitting: Nurse Practitioner

## 2016-07-28 ENCOUNTER — Encounter: Payer: Self-pay | Admitting: Nurse Practitioner

## 2016-07-28 VITALS — BP 120/64 | HR 74 | Temp 97.9°F | Ht 60.0 in | Wt 108.0 lb

## 2016-07-28 DIAGNOSIS — J0140 Acute pansinusitis, unspecified: Secondary | ICD-10-CM

## 2016-07-28 MED ORDER — AMOXICILLIN-POT CLAVULANATE 875-125 MG PO TABS: 1.0000 | ORAL_TABLET | Freq: Two times a day (BID) | ORAL | 0 refills | Status: DC

## 2016-07-28 MED ORDER — SALINE SPRAY 0.65 % NA SOLN: 1.0000 | NASAL | 0 refills | Status: DC | PRN

## 2016-07-28 MED ORDER — PREDNISONE 10 MG (21) PO TBPK: 10.0000 mg | ORAL_TABLET | ORAL | 0 refills | Status: DC

## 2016-07-28 MED ORDER — GUAIFENESIN ER 600 MG PO TB12: 600.0000 mg | ORAL_TABLET | Freq: Two times a day (BID) | ORAL | 0 refills | Status: DC | PRN

## 2016-07-28 NOTE — Patient Instructions (Signed)
Hold Flonase while taking oral prednisone.  Encourage adequate oral hydration.  Return to office if no improvement in one week

## 2016-07-28 NOTE — Progress Notes (Signed)
Pre visit review using our clinic review tool, if applicable. No additional management support is needed unless otherwise documented below in the visit note. 

## 2016-07-28 NOTE — Progress Notes (Signed)
Subjective:  Patient ID: Jo Gomez, female    DOB: [birth year]  Age: 70 y.o. MRN: [id number]  CC: Nasal Congestion (sinus congestion,cough with yellow mucus for 2 wks. took tylanal,nasal wash.)   Sinusitis  This is a new problem. The current episode started 1 to 4 weeks ago. The problem has been waxing and waning since onset. There has been no fever. The pain is severe. Associated symptoms include chills, congestion, headaches, a hoarse voice, sinus pressure, a sore throat and swollen glands. Past treatments include saline sprays, acetaminophen, lying down and oral decongestants. The treatment provided mild relief.    Outpatient Medications Prior to Visit  Medication Sig Dispense Refill  . acyclovir (ZOVIRAX) 400 MG tablet One tablet 4 times daily when necessary 50 tablet 3  . alendronate (FOSAMAX) 10 MG tablet Take 1 tablet (10 mg total) by mouth daily before breakfast. Take with a full glass of water on an empty stomach. 90 tablet 4  . cyanocobalamin (,VITAMIN B-12,) 1000 MCG/ML injection INJECT 1 ML( 1,000 MCG) UNDER THE SKIN EVERY 30 DAYS 30 mL 0  . estrogen-methylTESTOSTERone (EST ESTROGENS-METHYLTEST HS) 0.625-1.25 MG tablet Take 1 tablet by mouth daily. 30 tablet 5  . fluticasone (FLONASE) 50 MCG/ACT nasal spray USE 2 SPRAYS IN EACH NOSTRIL QD AT NIGHT  5  . levothyroxine (SYNTHROID, LEVOTHROID) 75 MCG tablet Take 1 tablet (75 mcg total) by mouth daily. 90 tablet 3  . PROAIR HFA 108 (90 Base) MCG/ACT inhaler INHALE 2 PUFFS INTO THE LUNGS EVERY 6 HOURS AS NEEDED 8.5 g 11  . SAFETY-LOK TB SYR 1CC/25GX5/8" 25G X 5/8" 1 ML MISC USE AS DIRECTED FOR B-12 INJECTIONS. 20 each 3  . sertraline (ZOLOFT) 100 MG tablet Take 1.5 tablets (150 mg total) by mouth daily. 135 tablet 3  . traZODone (DESYREL) 150 MG tablet Take 1 tablet (150 mg total) by mouth at bedtime. 90 tablet 3  . Vitamin D, Ergocalciferol, (DRISDOL) 50000 units CAPS capsule TAKE 1 CAPSULE BY MOUTH AS DIRECTED 12 capsule 0    Facility-Administered Medications Prior to Visit  Medication Dose Route Frequency Provider Last Rate Last Dose  . 0.9 %  sodium chloride infusion  500 mL Intravenous Continuous Jo Artist, MD        ROS See HPI  Objective:  BP 120/64   Pulse 74   Temp 97.9 F (36.6 C)   Ht 5' (1.524 m)   Wt 108 lb (49 kg)   LMP 08/24/1997   SpO2 98%   BMI 21.09 kg/m   BP Readings from Last 3 Encounters:  07/28/16 120/64  07/15/16 121/63  06/19/16 132/76    Wt Readings from Last 3 Encounters:  07/28/16 108 lb (49 kg)  07/15/16 108 lb (49 kg)  06/19/16 108 lb (49 kg)    Physical Exam  Constitutional: She is oriented to person, place, and time.  HENT:  Right Ear: Tympanic membrane, external ear and ear canal normal.  Left Ear: Tympanic membrane, external ear and ear canal normal.  Nose: Mucosal edema and rhinorrhea present. Right sinus exhibits maxillary sinus tenderness and frontal sinus tenderness. Left sinus exhibits maxillary sinus tenderness and frontal sinus tenderness.  Mouth/Throat: Uvula is midline. No trismus in the jaw. Posterior oropharyngeal erythema present. No oropharyngeal exudate.  Eyes: No scleral icterus.  Neck: Normal range of motion. Neck supple.  Cardiovascular: Normal rate and normal heart sounds.   Pulmonary/Chest: Effort normal and breath sounds normal.  Musculoskeletal: She exhibits no edema.  Lymphadenopathy:  She has cervical adenopathy.  Neurological: She is alert and oriented to person, place, and time.  Vitals reviewed.   Lab Results  Component Value Date   WBC 5.5 09/18/2015   HGB 13.5 09/18/2015   HCT 39.9 09/18/2015   PLT 257.0 09/18/2015   GLUCOSE 98 09/18/2015   CHOL 180 09/18/2015   TRIG 86.0 09/18/2015   HDL 61.20 09/18/2015   LDLDIRECT 114.9 08/07/2013   LDLCALC 101 (H) 09/18/2015   ALT 28 09/18/2015   AST 24 09/18/2015   NA 136 09/18/2015   K 4.4 09/18/2015   CL 101 09/18/2015   CREATININE 0.77 09/18/2015   BUN 11  09/18/2015   CO2 29 09/18/2015   TSH 1.07 09/18/2015    Mr Brain Wo Contrast  Result Date: 03/04/2016 CLINICAL DATA:  70 year old female with possible third ventricular colloid cyst on recent face CT. Symptoms of sinus congestion and headaches for 6 months. Subsequent encounter. EXAM: MRI HEAD WITHOUT CONTRAST TECHNIQUE: Multiplanar, multiecho pulse sequences of the brain and surrounding structures were obtained without intravenous contrast. COMPARISON:  Paranasal sinus/face CT 02/26/2016. FINDINGS: Round 4 mm T1 and T2 isointense lesion along the anterior margin of third ventricle posterior to the columns of the fornix is best seen on series 3 and series 9 and is compatible with a small colloid cyst. Mild ventricular asymmetry is stable and appears to be normal anatomic variation. No evidence of ventriculomegaly or transependymal edema. No restricted diffusion to suggest acute infarction. No midline shift, mass effect, or acute intracranial hemorrhage. Cervicomedullary junction and pituitary are within normal limits. Major intracranial vascular flow voids appear normal. Jo Gomez and white matter signal is within normal limits throughout the brain. No encephalomalacia or chronic cerebral blood products. Visible internal auditory structures appear normal. Mastoids are clear. Trace paranasal sinus mucosal thickening. Negative orbit and scalp soft tissues. Negative visualized cervical spine. Normal bone marrow signal. IMPRESSION: 1. Confirmed small 4 mm colloid cyst along the anterior third ventricle. Although usually asymptomatic, they can rarely present with acute and profound hydrocephalus. And some hypothesize that they can cause intermittent ventricular obstruction. 2. Otherwise normal noncontrast MRI appearance of the brain. Electronically Signed   By: Jo Gomez M.D.   On: 03/04/2016 15:41    Assessment & Plan:   Jo Gomez was seen today for nasal congestion.  Diagnoses and all orders for this visit:  Acute  non-recurrent pansinusitis -     amoxicillin-clavulanate (AUGMENTIN) 875-125 MG tablet; Take 1 tablet by mouth 2 (two) times daily. -     guaiFENesin (MUCINEX) 600 MG 12 hr tablet; Take 1 tablet (600 mg total) by mouth 2 (two) times daily as needed for cough or to loosen phlegm. -     predniSONE (STERAPRED UNI-PAK 21 TAB) 10 MG (21) TBPK tablet; Take 1 tablet (10 mg total) by mouth as directed. -     sodium chloride (OCEAN) 0.65 % SOLN nasal spray; Place 1 spray into both nostrils as needed for congestion.   I am having Ms. Erlich start on amoxicillin-clavulanate, guaiFENesin, predniSONE, and sodium chloride. I am also having her maintain her acyclovir, cyanocobalamin, traZODone, levothyroxine, sertraline, SAFETY-LOK TB SYR 1CC/25GX5/8", estrogen-methylTESTOSTERone, alendronate, fluticasone, Vitamin D (Ergocalciferol), and PROAIR HFA. We will continue to administer sodium chloride.  Meds ordered this encounter  Medications  . amoxicillin-clavulanate (AUGMENTIN) 875-125 MG tablet    Sig: Take 1 tablet by mouth 2 (two) times daily.    Dispense:  20 tablet    Refill:  0  Order Specific Question:   Supervising Provider    Answer:   Cassandria Anger [[id number]]  . guaiFENesin (MUCINEX) 600 MG 12 hr tablet    Sig: Take 1 tablet (600 mg total) by mouth 2 (two) times daily as needed for cough or to loosen phlegm.    Dispense:  14 tablet    Refill:  0    Order Specific Question:   Supervising Provider    Answer:   Cassandria Anger [[id number]]  . predniSONE (STERAPRED UNI-PAK 21 TAB) 10 MG (21) TBPK tablet    Sig: Take 1 tablet (10 mg total) by mouth as directed.    Dispense:  21 tablet    Refill:  0    Order Specific Question:   Supervising Provider    Answer:   Cassandria Anger [[id number]]  . sodium chloride (OCEAN) 0.65 % SOLN nasal spray    Sig: Place 1 spray into both nostrils as needed for congestion.    Dispense:  15 mL    Refill:  0    Order Specific Question:   Supervising Provider     Answer:   Cassandria Anger [[id number]]    Follow-up: No Follow-up on file.  Wilfred Lacy, NP

## 2016-07-31 ENCOUNTER — Other Ambulatory Visit: Payer: Self-pay | Admitting: Family Medicine

## 2016-08-06 NOTE — Progress Notes (Signed)
Subjective:    Patient ID: Jo Gomez, female    DOB: Jul 08, 1946, 70 y.o.   MRN: [id number]  HPI  She is here for an acute visit.   Migraine headache:  She has a history of migraines, but has not had one recently.  She has an intractable migraine now that started 5 nights ago.  It has been persistent since it started.   Nothing she has taken otc has helped.  It feels like one of her typical migraines.  She denies aura.  The headache can be on either side of her head, but is often in the front of her head.   She has some nausea.  She denies dizziness/lightheadedness, changes in vision and weakness.    In the past she has taken imitrex, but it was years ago.  She thinks it did work well.        Medications and allergies reviewed with patient and updated if appropriate.  Patient Active Problem List   Diagnosis Date Noted  . Biceps tendonitis 06/19/2016  . Yeast infection of the skin 04/29/2016  . 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 Chronic frontal sinusitis 02/06/2016  . Acute sinus infection 01/22/2016  . Insomnia 09/16/2015  . Low back pain radiating to left leg 06/25/2014  . 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 HIATAL HERNIA WITH REFLUX 07/09/2010  . Vitamin B12 deficiency 03/05/2010  . Esophageal reflux 03/04/2010  . 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 Hypothyroidism 09/05/2007  . Allergic rhinitis 09/05/2007  . Depression 10/21/2006  . Osteopenia 10/21/2006    Current Outpatient Prescriptions on File Prior to Visit  Medication Sig Dispense Refill  . acyclovir (ZOVIRAX) 400 MG tablet One tablet 4 times daily when necessary 50 tablet 3  . alendronate (FOSAMAX) 10 MG tablet Take 1 tablet (10 mg total) by mouth daily before breakfast. Take with a full glass of water on an empty stomach. 90 tablet 4  . cyanocobalamin (,VITAMIN B-12,) 1000 MCG/ML injection INJECT 1 ML( 1,000 MCG) UNDER THE SKIN EVERY 30 DAYS 30 mL 0    . estrogen-methylTESTOSTERone (EST ESTROGENS-METHYLTEST HS) 0.625-1.25 MG tablet Take 1 tablet by mouth daily. 30 tablet 5  . fluticasone (FLONASE) 50 MCG/ACT nasal spray USE 2 SPRAYS IN EACH NOSTRIL QD AT NIGHT  5  . levothyroxine (SYNTHROID, LEVOTHROID) 75 MCG tablet Take 1 tablet (75 mcg total) by mouth daily. 90 tablet 3  . PROAIR HFA 108 (90 Base) MCG/ACT inhaler INHALE 2 PUFFS INTO THE LUNGS EVERY 6 HOURS AS NEEDED 8.5 g 11  . SAFETY-LOK TB SYR 1CC/25GX5/8" 25G X 5/8" 1 ML MISC USE AS DIRECTED FOR B-12 INJECTIONS. 20 each 3  . sertraline (ZOLOFT) 100 MG tablet Take 1.5 tablets (150 mg total) by mouth daily. 135 tablet 3  . sodium chloride (OCEAN) 0.65 % SOLN nasal spray Place 1 spray into both nostrils as needed for congestion. 15 mL 0  . traZODone (DESYREL) 150 MG tablet Take 1 tablet (150 mg total) by mouth at bedtime. 90 tablet 3  . Vitamin D, Ergocalciferol, (DRISDOL) 50000 units CAPS capsule TAKE 1 CAPSULE BY MOUTH AS DIRECTED 12 capsule 0   Current Facility-Administered Medications on File Prior to Visit  Medication Dose Route Frequency Provider Last Rate Last Dose  . 0.9 %  sodium chloride infusion  500 mL Intravenous Continuous Ladene Artist, MD        Past Medical History:  Diagnosis Date  .  Allergy   . Anxiety   . 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 Asthma   . Asthma   . Depression   . GERD (gastroesophageal reflux disease)    diet controlled, no med  . History of endoscopy   . HSV infection   . IgA deficiency ([place])   . Low vitamin B12 level   . Osteoporosis   . Ovarian cyst    TAH/BSO  . Thyroid dysfunction     Past Surgical History:  Procedure Laterality Date  . broken arm     surgery for this  . BROW LIFT  1/15  . COLONOSCOPY    . THYROIDECTOMY  2000  . TOTAL ABDOMINAL HYSTERECTOMY W/ BILATERAL SALPINGOOPHORECTOMY  1999    Social History   Social History  . Marital status: Significant Other    Spouse name: N/A  . Number of  children: N/A  . Years of education: N/A   Social History Main Topics  . Smoking status: Former Smoker    Years: 3.00    Types: Cigarettes    Quit date: 08/24/1968  . Smokeless tobacco: Never Used     Comment: in college  . Alcohol use 2.4 oz/week    4 Glasses of wine per week     Comment: glass of wine  . Drug use: No  . Sexual activity: Yes    Partners: Female    Birth control/ protection: Surgical     Comment: TAH/BSO   Other Topics Concern  . Not on file   Social History Narrative  . No narrative on file    Family History  Problem Relation Age of Onset  . COPD Father     chronic lung infections  . Colon polyps Father   . Heart attack Mother   . Osteoporosis Mother   . Breast cancer Mother 72  . Lung cancer Mother   . Colon polyps Mother   . Colon cancer Neg Hx   . Esophageal cancer Neg Hx   . Stomach cancer Neg Hx   . Rectal cancer Neg Hx     Review of Systems  Constitutional: Negative for fever.  Eyes: Negative for visual disturbance.  Gastrointestinal: Positive for nausea. Negative for vomiting.  Skin: Negative for rash.  Neurological: Positive for dizziness (not new, occasional) and headaches. Negative for weakness, light-headedness and numbness.       Objective:   Vitals:   08/07/16 1048  BP: 140/80  Pulse: 77  Resp: 16  Temp: 97.9 F (36.6 C)   Filed Weights   08/07/16 1048  Weight: 110 lb (49.9 kg)   Body mass index is 21.48 kg/m.   Physical Exam  Constitutional: She is oriented to person, place, and time. She appears well-developed and well-nourished. No distress.  Musculoskeletal: She exhibits no edema.  Neurological: She is alert and oriented to person, place, and time. No cranial nerve deficit. She exhibits normal muscle tone. Coordination normal.  Normal strength and sensation in all extremities  Skin: Skin is warm and dry. She is not diaphoretic.          Assessment & Plan:   See Problem List for Assessment and Plan of  chronic medical problems.

## 2016-08-07 ENCOUNTER — Ambulatory Visit (INDEPENDENT_AMBULATORY_CARE_PROVIDER_SITE_OTHER): Payer: Medicare Other | Admitting: Internal Medicine

## 2016-08-07 VITALS — BP 140/80 | HR 77 | Temp 97.9°F | Resp 16 | Wt 110.0 lb

## 2016-08-07 DIAGNOSIS — G43011 Migraine without aura, intractable, with status migrainosus: Secondary | ICD-10-CM | POA: Diagnosis not present

## 2016-08-07 MED ORDER — METHYLPREDNISOLONE ACETATE 80 MG/ML IJ SUSP
80.0000 mg | Freq: Once | INTRAMUSCULAR | Status: AC
  Administered 2016-08-07: 80 mg via INTRAMUSCULAR

## 2016-08-07 MED ORDER — SUMATRIPTAN SUCCINATE 100 MG PO TABS: 100.0000 mg | ORAL_TABLET | ORAL | 0 refills | Status: DC | PRN

## 2016-08-07 MED ORDER — KETOROLAC TROMETHAMINE 60 MG/2ML IM SOLN
60.0000 mg | Freq: Once | INTRAMUSCULAR | Status: AC
  Administered 2016-08-07: 60 mg via INTRAMUSCULAR

## 2016-08-07 NOTE — Patient Instructions (Addendum)
You received Toradol and depo - medrol today for your migraine.   If your migraine does not go away please call with questions.    Imitrex was sent to your pharmacy in case you have future migraines.

## 2016-08-07 NOTE — Progress Notes (Signed)
Pre visit review using our clinic review tool, if applicable. No additional management support is needed unless otherwise documented below in the visit note. 

## 2016-08-08 ENCOUNTER — Encounter: Payer: Self-pay | Admitting: Internal Medicine

## 2016-08-09 ENCOUNTER — Encounter: Payer: Self-pay | Admitting: Internal Medicine

## 2016-08-09 DIAGNOSIS — G43011 Migraine without aura, intractable, with status migrainosus: Secondary | ICD-10-CM | POA: Insufficient documentation

## 2016-08-09 NOTE — Assessment & Plan Note (Signed)
Intractable migraine x 5 days w/o relief from otc medications This is a typical migraine for her w/o any concerning symptoms/signs Depo-medrol 80 mg IM x 1 Toradol 60 mg IM x1 Call if no improvement  I will prescribe imitrex for her to have at home in case she has another migraine

## 2016-08-10 MED ORDER — ONDANSETRON 4 MG PO TBDP: 4.0000 mg | ORAL_TABLET | Freq: Three times a day (TID) | ORAL | 0 refills | Status: DC | PRN

## 2016-08-10 NOTE — Telephone Encounter (Signed)
Please check with insurance/pharamcy - she says we need to do a prior auth for the imitrex - ????

## 2016-08-14 ENCOUNTER — Telehealth: Payer: Self-pay | Admitting: *Deleted

## 2016-08-14 NOTE — Telephone Encounter (Signed)
Rec'd fax pt need PA on sumatriptan. Completed PA on cover-my-meds KEY # [id number]. CASE ID# [id number].[employer] is reviewing your PA request. Typically an electronic response will be received within 72 hours. Waiting on approval status...Johny Chess

## 2016-08-18 MED ORDER — RIZATRIPTAN BENZOATE 10 MG PO TABS: 10.0000 mg | ORAL_TABLET | ORAL | 5 refills | Status: DC | PRN

## 2016-08-18 NOTE — Telephone Encounter (Signed)
?   Denied due to incorrect directions for taking medication.   Will send maxalt to pharmacy and see if it is approved.

## 2016-08-18 NOTE — Telephone Encounter (Signed)
Rec'd msg back from insurance below " Denied on December 24 PA Case [id number] is denied. For further questions, call [phone]. Appeals are not supported through [facility]. Please refer to the written case notice for appeals information and instructions" forwarding msg to MD for alternative...Johny Chess

## 2016-08-22 ENCOUNTER — Other Ambulatory Visit: Payer: Self-pay | Admitting: Obstetrics & Gynecology

## 2016-08-25 NOTE — Telephone Encounter (Signed)
Last AEX was 02/14/16. Patient scheduled Lab visit for 08/27/16 @ 1530

## 2016-08-25 NOTE — Telephone Encounter (Signed)
Medication refill request: Vitamin D Last AEX:  11/29/14 SM Next AEX: 05/21/17 SM Last MMG (if hormonal medication request): 11/19/15 [facility] 1, [facility] Refill authorized: 05/18/16 #[id number]. Please advise. Thank you.   Routing to [place] since SM is out of the office today.

## 2016-08-25 NOTE — Telephone Encounter (Signed)
Correction--Lab appt scheduled for 08/26/16 @ 1530. -sco

## 2016-08-25 NOTE — Telephone Encounter (Signed)
Please have the patient come in for a vit D level prior to refilling the script. She is also overdue for an annual exam with Dr Sabra Heck.

## 2016-08-26 ENCOUNTER — Other Ambulatory Visit (INDEPENDENT_AMBULATORY_CARE_PROVIDER_SITE_OTHER): Payer: Medicare Other

## 2016-08-26 DIAGNOSIS — M810 Age-related osteoporosis without current pathological fracture: Secondary | ICD-10-CM

## 2016-08-27 LAB — VITAMIN D 25 HYDROXY (VIT D DEFICIENCY, FRACTURES): Vit D, 25-Hydroxy: 30 ng/mL (ref 30–100)

## 2016-08-27 MED ORDER — VITAMIN D (ERGOCALCIFEROL) 1.25 MG (50000 UNIT) PO CAPS: ORAL_CAPSULE | ORAL | 4 refills | Status: DC

## 2016-08-28 NOTE — Telephone Encounter (Signed)
Rx was sent to her pharmacy on 08/27/16 by Dr. Quincy Simmonds after receiving results from her Vit D labs. -sco

## 2016-08-30 ENCOUNTER — Encounter: Payer: Self-pay | Admitting: Internal Medicine

## 2016-08-30 DIAGNOSIS — B0089 Other herpesviral infection: Secondary | ICD-10-CM

## 2016-08-31 MED ORDER — ACYCLOVIR 400 MG PO TABS: ORAL_TABLET | ORAL | 3 refills | Status: DC

## 2016-09-08 ENCOUNTER — Other Ambulatory Visit: Payer: Self-pay | Admitting: Emergency Medicine

## 2016-09-14 ENCOUNTER — Other Ambulatory Visit: Payer: Self-pay | Admitting: Emergency Medicine

## 2016-09-24 ENCOUNTER — Other Ambulatory Visit: Payer: Self-pay | Admitting: Internal Medicine

## 2016-09-26 ENCOUNTER — Other Ambulatory Visit: Payer: Self-pay | Admitting: Internal Medicine

## 2016-09-29 ENCOUNTER — Ambulatory Visit (INDEPENDENT_AMBULATORY_CARE_PROVIDER_SITE_OTHER): Payer: Medicare Other | Admitting: Internal Medicine

## 2016-09-29 ENCOUNTER — Encounter: Payer: Self-pay | Admitting: Internal Medicine

## 2016-09-29 VITALS — BP 150/92 | HR 74 | Temp 98.6°F | Resp 16 | Ht 60.0 in | Wt 113.0 lb

## 2016-09-29 DIAGNOSIS — Z0000 Encounter for general adult medical examination without abnormal findings: Secondary | ICD-10-CM | POA: Diagnosis not present

## 2016-09-29 DIAGNOSIS — Q046 Congenital cerebral cysts: Secondary | ICD-10-CM | POA: Diagnosis not present

## 2016-09-29 DIAGNOSIS — E890 Postprocedural hypothyroidism: Secondary | ICD-10-CM

## 2016-09-29 DIAGNOSIS — M8580 Other specified disorders of bone density and structure, unspecified site: Secondary | ICD-10-CM

## 2016-09-29 DIAGNOSIS — R011 Cardiac murmur, unspecified: Secondary | ICD-10-CM

## 2016-09-29 DIAGNOSIS — F3289 Other specified depressive episodes: Secondary | ICD-10-CM

## 2016-09-29 MED ORDER — SERTRALINE HCL 100 MG PO TABS: 100.0000 mg | ORAL_TABLET | Freq: Every day | ORAL | 3 refills | Status: DC

## 2016-09-29 NOTE — Patient Instructions (Addendum)
Jo Gomez , Thank you for taking time to come for your Medicare Wellness Visit. I appreciate your ongoing commitment to your health goals. Please review the following plan we discussed and let me know if I can assist you in the future.   These are the goals we discussed: Goals    Consider having a pneumonia vaccine      This is a list of the screening recommended for you and due dates:  Health Maintenance  Topic Date Due  . Shingles Vaccine  02/24/2008  . Pneumonia vaccines (2 of 2 - PPSV23) 06/26/2015  . DEXA scan (bone density measurement)  09/15/2015  . Flu Shot  11/22/2016*  . Mammogram  11/18/2017  . Colon Cancer Screening  07/15/2021  . Tetanus Vaccine  12/01/2023  .  Hepatitis C: One time screening is recommended by Center for Disease Control  (CDC) for  adults born from [birth year] through 1963.   Completed  *Topic was postponed. The date shown is not the original due date.    Test(s) ordered today. Your results will be released to [place] (or called to you) after review, usually within 72hours after test completion. If any changes need to be made, you will be notified at that same time.  All other Health Maintenance issues reviewed.   All recommended immunizations and age-appropriate screenings are up-to-date or discussed.  No immunizations administered today.   Medications reviewed and updated.  No changes recommended at this time.  An Echo was ordered.  Please followup in one year   Health Maintenance, Female Introduction Adopting a healthy lifestyle and getting preventive care can go a long way to promote health and wellness. Talk with your health care provider about what schedule of regular examinations is right for you. This is a good chance for you to check in with your provider about disease prevention and staying healthy. In between checkups, there are plenty of things you can do on your own. Experts have done a lot of research about which lifestyle changes and  preventive measures are most likely to keep you healthy. Ask your health care provider for more information. Weight and diet Eat a healthy diet  Be sure to include plenty of vegetables, fruits, low-fat dairy products, and lean protein.  Do not eat a lot of foods high in solid fats, added sugars, or salt.  Get regular exercise. This is one of the most important things you can do for your health.  Most adults should exercise for at least 150 minutes each week. The exercise should increase your heart rate and make you sweat (moderate-intensity exercise).  Most adults should also do strengthening exercises at least twice a week. This is in addition to the moderate-intensity exercise. Maintain a healthy weight  Body mass index (BMI) is a measurement that can be used to identify possible weight problems. It estimates body fat based on height and weight. Your health care provider can help determine your BMI and help you achieve or maintain a healthy weight.  For females 31 years of age and older:  A BMI below 18.5 is considered underweight.  A BMI of 18.5 to 24.9 is normal.  A BMI of 25 to 29.9 is considered overweight.  A BMI of 30 and above is considered obese. Watch levels of cholesterol and blood lipids  You should start having your blood tested for lipids and cholesterol at 69 years of age, then have this test every 5 years.  You may need to have your  cholesterol levels checked more often if:  Your lipid or cholesterol levels are high.  You are older than 69 years of age.  You are at high risk for heart disease. Cancer screening Lung Cancer  Lung cancer screening is recommended for adults 73-30 years old who are at high risk for lung cancer because of a history of smoking.  A yearly low-dose CT scan of the lungs is recommended for people who:  Currently smoke.  Have quit within the past 15 years.  Have at least a 30-pack-year history of smoking. A pack year is smoking an  average of one pack of cigarettes a day for 1 year.  Yearly screening should continue until it has been 15 years since you quit.  Yearly screening should stop if you develop a health problem that would prevent you from having lung cancer treatment. Breast Cancer  Practice breast self-awareness. This means understanding how your breasts normally appear and feel.  It also means doing regular breast self-exams. Let your health care provider know about any changes, no matter how small.  If you are in your 20s or 30s, you should have a clinical breast exam (CBE) by a health care provider every 1-3 years as part of a regular health exam.  If you are 48 or older, have a CBE every year. Also consider having a breast X-ray (mammogram) every year.  If you have a family history of breast cancer, talk to your health care provider about genetic screening.  If you are at high risk for breast cancer, talk to your health care provider about having an MRI and a mammogram every year.  Breast cancer gene (BRCA) assessment is recommended for women who have family members with BRCA-related cancers. BRCA-related cancers include:  Breast.  Ovarian.  Tubal.  Peritoneal cancers.  Results of the assessment will determine the need for genetic counseling and BRCA1 and BRCA2 testing. Cervical Cancer  Your health care provider may recommend that you be screened regularly for cancer of the pelvic organs (ovaries, uterus, and vagina). This screening involves a pelvic examination, including checking for microscopic changes to the surface of your cervix (Pap test). You may be encouraged to have this screening done every 3 years, beginning at age 23.  For women ages 76-65, health care providers may recommend pelvic exams and Pap testing every 3 years, or they may recommend the Pap and pelvic exam, combined with testing for human papilloma virus (HPV), every 5 years. Some types of HPV increase your risk of cervical  cancer. Testing for HPV may also be done on women of any age with unclear Pap test results.  Other health care providers may not recommend any screening for nonpregnant women who are considered low risk for pelvic cancer and who do not have symptoms. Ask your health care provider if a screening pelvic exam is right for you.  If you have had past treatment for cervical cancer or a condition that could lead to cancer, you need Pap tests and screening for cancer for at least 20 years after your treatment. If Pap tests have been discontinued, your risk factors (such as having a new sexual partner) need to be reassessed to determine if screening should resume. Some women have medical problems that increase the chance of getting cervical cancer. In these cases, your health care provider may recommend more frequent screening and Pap tests. Colorectal Cancer  This type of cancer can be detected and often prevented.  Routine colorectal cancer screening usually  begins at 69 years of age and continues through 69 years of age.  Your health care provider may recommend screening at an earlier age if you have risk factors for colon cancer.  Your health care provider may also recommend using home test kits to check for hidden blood in the stool.  A small camera at the end of a tube can be used to examine your colon directly (sigmoidoscopy or colonoscopy). This is done to check for the earliest forms of colorectal cancer.  Routine screening usually begins at age 69.  Direct examination of the colon should be repeated every 5-10 years through 69 years of age. However, you may need to be screened more often if early forms of precancerous polyps or small growths are found. Skin Cancer  Check your skin from head to toe regularly.  Tell your health care provider about any new moles or changes in moles, especially if there is a change in a mole's shape or color.  Also tell your health care provider if you have a  mole that is larger than the size of a pencil eraser.  Always use sunscreen. Apply sunscreen liberally and repeatedly throughout the day.  Protect yourself by wearing long sleeves, pants, a wide-brimmed hat, and sunglasses whenever you are outside. Heart disease, diabetes, and high blood pressure  High blood pressure causes heart disease and increases the risk of stroke. High blood pressure is more likely to develop in:  People who have blood pressure in the high end of the normal range (130-139/85-89 mm Hg).  People who are overweight or obese.  People who are African American.  If you are 31-64 years of age, have your blood pressure checked every 3-5 years. If you are 31 years of age or older, have your blood pressure checked every year. You should have your blood pressure measured twice-once when you are at a hospital or clinic, and once when you are not at a hospital or clinic. Record the average of the two measurements. To check your blood pressure when you are not at a hospital or clinic, you can use:  An automated blood pressure machine at a pharmacy.  A home blood pressure monitor.  If you are between 29 years and 67 years old, ask your health care provider if you should take aspirin to prevent strokes.  Have regular diabetes screenings. This involves taking a blood sample to check your fasting blood sugar level.  If you are at a normal weight and have a low risk for diabetes, have this test once every three years after 69 years of age.  If you are overweight and have a high risk for diabetes, consider being tested at a younger age or more often. Preventing infection Hepatitis B  If you have a higher risk for hepatitis B, you should be screened for this virus. You are considered at high risk for hepatitis B if:  You were born in a country where hepatitis B is common. Ask your health care provider which countries are considered high risk.  Your parents were born in a  high-risk country, and you have not been immunized against hepatitis B (hepatitis B vaccine).  You have HIV or AIDS.  You use needles to inject street drugs.  You live with someone who has hepatitis B.  You have had sex with someone who has hepatitis B.  You get hemodialysis treatment.  You take certain medicines for conditions, including cancer, organ transplantation, and autoimmune conditions. Hepatitis C  Blood testing is recommended for:  Everyone born from [birth year] through 1963.  Anyone with known risk factors for hepatitis C. Sexually transmitted infections (STIs)  You should be screened for sexually transmitted infections (STIs) including gonorrhea and chlamydia if:  You are sexually active and are younger than 69 years of age.  You are older than 69 years of age and your health care provider tells you that you are at risk for this type of infection.  Your sexual activity has changed since you were last screened and you are at an increased risk for chlamydia or gonorrhea. Ask your health care provider if you are at risk.  If you do not have HIV, but are at risk, it may be recommended that you take a prescription medicine daily to prevent HIV infection. This is called pre-exposure prophylaxis (PrEP). You are considered at risk if:  You are sexually active and do not regularly use condoms or know the HIV status of your partner(s).  You take drugs by injection.  You are sexually active with a partner who has HIV. Talk with your health care provider about whether you are at high risk of being infected with HIV. If you choose to begin PrEP, you should first be tested for HIV. You should then be tested every 3 months for as long as you are taking PrEP. Pregnancy  If you are premenopausal and you may become pregnant, ask your health care provider about preconception counseling.  If you may become pregnant, take 400 to 800 micrograms (mcg) of folic acid every day.  If you want  to prevent pregnancy, talk to your health care provider about birth control (contraception). Osteoporosis and menopause  Osteoporosis is a disease in which the bones lose minerals and strength with aging. This can result in serious bone fractures. Your risk for osteoporosis can be identified using a bone density scan.  If you are 70 years of age or older, or if you are at risk for osteoporosis and fractures, ask your health care provider if you should be screened.  Ask your health care provider whether you should take a calcium or vitamin D supplement to lower your risk for osteoporosis.  Menopause may have certain physical symptoms and risks.  Hormone replacement therapy may reduce some of these symptoms and risks. Talk to your health care provider about whether hormone replacement therapy is right for you. Follow these instructions at home:  Schedule regular health, dental, and eye exams.  Stay current with your immunizations.  Do not use any tobacco products including cigarettes, chewing tobacco, or electronic cigarettes.  If you are pregnant, do not drink alcohol.  If you are breastfeeding, limit how much and how often you drink alcohol.  Limit alcohol intake to no more than 1 drink per day for nonpregnant women. One drink equals 12 ounces of beer, 5 ounces of wine, or 1 ounces of hard liquor.  Do not use street drugs.  Do not share needles.  Ask your health care provider for help if you need support or information about quitting drugs.  Tell your health care provider if you often feel depressed.  Tell your health care provider if you have ever been abused or do not feel safe at home. This information is not intended to replace advice given to you by your health care provider. Make sure you discuss any questions you have with your health care provider. Document Released: 02/23/2011 Document Revised: 01/16/2016 Document Reviewed: 05/14/2015  2017 [employer]vier

## 2016-09-29 NOTE — Progress Notes (Signed)
Pre visit review using our clinic review tool, if applicable. No additional management support is needed unless otherwise documented below in the visit note. 

## 2016-09-29 NOTE — Progress Notes (Signed)
Subjective:    Patient ID: Jo Gomez, female    DOB: August 18, 1946, 71 y.o.   MRN: [id number]  HPI Here for medicare wellness exam an an annual physical exam.   I have personally reviewed and have noted 1.The patient's medical and social history 2.Their use of alcohol, tobacco or illicit drugs 3.Their current medications and supplements 4.The patient's functional ability including ADL's, fall risks, home safety risks and                 hearing or visual impairment. 5.Diet and physical activities 6.Evidence for depression or mood disorders 7.Care team reviewed - neurosurgery - Dr Vertell Limber, [place] Passey - Dr Mardelle Matte, gyn- Dr Sabra Heck, Fabienne Bruns - Dr Fuller Plan   Are there smokers in your home (other than you)? No  Risk Factors Exercise: yoga regularly, walking Dietary issues discussed: veges, fruits, well balanced.  Cardiac risk factors: advanced age  Depression Screen  Have you felt down, depressed or hopeless? No  Have you felt little interest or pleasure in doing things?  No  Activities of Daily Living In your present state of health, do you have any difficulty performing the following activities?:  Driving? No Managing money?  No Feeding yourself? No Getting from bed to chair? No Climbing a flight of stairs? No Preparing food and eating?: No Bathing or showering? No Getting dressed: No Getting to/using the toilet? No Moving around from place to place: No In the past year have you fallen or had a near fall?: No   Are you sexually active?  No  Do you have more than one partner?  N/A  Hearing Difficulties: yes Do you often ask people to speak up or repeat themselves? No Do you experience ringing or noises in your ears? No Do you have difficulty understanding soft or whispered voices? yes Vision:              Any change in vision:  no             Up to date with eye exam: yes Memory:  Do you feel that you have a  problem with memory? No  Do you often misplace items? No  Do you feel safe at home?  Yes  Cognitive Testing  Alert, Orientated? Yes  Normal Appearance? Yes  Recall of three objects?  Yes  Can perform simple calculations? Yes  Displays appropriate judgment? Yes  Can read the correct time from a watch face? Yes   Advanced Directives have been discussed with the patient? Yes- in place   Medications and allergies reviewed with patient and updated if appropriate.  Patient Active Problem List   Diagnosis Date Noted  . Intractable migraine without aura and with status migrainosus 08/09/2016  . Pain of both shoulder joints 06/24/2016  . Biceps tendonitis 06/19/2016  . Yeast infection of the skin 04/29/2016  . 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 Chronic frontal sinusitis 02/06/2016  . Insomnia 09/16/2015  . Low back pain radiating to left leg 06/25/2014  . 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 HIATAL HERNIA WITH REFLUX 07/09/2010  . Vitamin B12 deficiency 03/05/2010  . Esophageal reflux 03/04/2010  . 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 Hypothyroidism 09/05/2007  . Allergic rhinitis 09/05/2007  . Depression 10/21/2006  . Osteopenia 10/21/2006    Current Outpatient Prescriptions on File Prior to Visit  Medication Sig Dispense Refill  . acyclovir (ZOVIRAX) 400 MG tablet One tablet 4 times daily when  necessary 50 tablet 3  . alendronate (FOSAMAX) 10 MG tablet Take 1 tablet (10 mg total) by mouth daily before breakfast. Take with a full glass of water on an empty stomach. 90 tablet 4  . cyanocobalamin (,VITAMIN B-12,) 1000 MCG/ML injection INJECT 1 ML UNDER THE SKIN EVERY 30 DAYS 30 mL 1  . estrogen-methylTESTOSTERone (EST ESTROGENS-METHYLTEST HS) 0.625-1.25 MG tablet Take 1 tablet by mouth daily. 30 tablet 5  . fluticasone (FLONASE) 50 MCG/ACT nasal spray USE 2 SPRAYS IN EACH NOSTRIL QD AT NIGHT  5  . levothyroxine (SYNTHROID,  LEVOTHROID) 75 MCG tablet Take 1 tablet (75 mcg total) by mouth daily before breakfast. -- Labs needed for further refills. 90 tablet 0  . ondansetron (ZOFRAN ODT) 4 MG disintegrating tablet Take 1 tablet (4 mg total) by mouth every 8 (eight) hours as needed for nausea or vomiting. 20 tablet 0  . PROAIR HFA 108 (90 Base) MCG/ACT inhaler INHALE 2 PUFFS INTO THE LUNGS EVERY 6 HOURS AS NEEDED 8.5 g 11  . rizatriptan (MAXALT) 10 MG tablet Take 1 tablet (10 mg total) by mouth as needed for migraine. May repeat in 2 hours if needed 10 tablet 5  . SAFETY-LOK TB SYR 1CC/25GX5/8" 25G X 5/8" 1 ML MISC USE AS DIRECTED FOR B-12 INJECTIONS. 20 each 3  . sodium chloride (OCEAN) 0.65 % SOLN nasal spray Place 1 spray into both nostrils as needed for congestion. 15 mL 0  . traZODone (DESYREL) 150 MG tablet Take 1 tablet (150 mg total) by mouth at bedtime. 90 tablet 3  . Vitamin D, Ergocalciferol, (DRISDOL) 50000 units CAPS capsule TAKE 1 CAPSULE BY MOUTH AS DIRECTED 12 capsule 4   No current facility-administered medications on file prior to visit.     Past Medical History:  Diagnosis Date  . Allergy   . Anxiety   . 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 Asthma   . Asthma   . Depression   . GERD (gastroesophageal reflux disease)    diet controlled, no med  . History of endoscopy   . HSV infection   . IgA deficiency ([place])   . Low vitamin B12 level   . Osteoporosis   . Ovarian cyst    TAH/BSO  . Thyroid dysfunction     Past Surgical History:  Procedure Laterality Date  . broken arm     surgery for this  . BROW LIFT  1/15  . COLONOSCOPY    . THYROIDECTOMY  2000  . TOTAL ABDOMINAL HYSTERECTOMY W/ BILATERAL SALPINGOOPHORECTOMY  1999    Social History   Social History  . Marital status: Significant Other    Spouse name: N/A  . Number of children: N/A  . Years of education: N/A   Social History Main Topics  . Smoking status: Former Smoker    Years: 3.00    Types: Cigarettes     Quit date: 08/24/1968  . Smokeless tobacco: Never Used     Comment: in college  . Alcohol use 2.4 oz/week    4 Glasses of wine per week     Comment: glass of wine  . Drug use: No  . Sexual activity: Yes    Partners: Female    Birth control/ protection: Surgical     Comment: TAH/BSO   Other Topics Concern  . Not on file   Social History Narrative  . No narrative on file    Family History  Problem Relation Age of Onset  .  COPD Father     chronic lung infections  . Colon polyps Father   . Heart attack Mother   . Osteoporosis Mother   . Breast cancer Mother 69  . Lung cancer Mother   . Colon polyps Mother   . Colon cancer Neg Hx   . Esophageal cancer Neg Hx   . Stomach cancer Neg Hx   . Rectal cancer Neg Hx     Review of Systems  Constitutional: Negative for appetite change, chills, fatigue, fever and unexpected weight change.  HENT: Positive for hearing loss (mild). Negative for tinnitus.   Eyes: Negative for visual disturbance.  Respiratory: Positive for cough (in morning only - clear). Negative for shortness of breath and wheezing.   Cardiovascular: Positive for palpitations (occ). Negative for chest pain and leg swelling.  Gastrointestinal: Positive for anal bleeding (hemorrhoids). Negative for abdominal pain, blood in stool, constipation, diarrhea and nausea.       Occ gerd  Genitourinary: Negative for dysuria and hematuria.  Musculoskeletal: Positive for arthralgias.  Skin: Negative for color change and rash.  Neurological: Negative for light-headedness and headaches.  Psychiatric/Behavioral: Positive for decreased concentration (controlled) and sleep disturbance (controlled). The patient is nervous/anxious (controlled).        Objective:   Vitals:   09/29/16 1422  BP: (!) 150/92  Pulse: 74  Resp: 16  Temp: 98.6 F (37 C)   Filed Weights   09/29/16 1422  Weight: 113 lb (51.3 kg)   Body mass index is 22.07 kg/m.  Wt Readings from Last 3 Encounters:    09/29/16 113 lb (51.3 kg)  08/07/16 110 lb (49.9 kg)  07/28/16 108 lb (49 kg)     Physical Exam Constitutional: She appears well-developed and well-nourished. No distress.  HENT:  Head: Normocephalic and atraumatic.  Right Ear: External ear normal. Normal ear canal and TM Left Ear: External ear normal.  Normal ear canal and TM Mouth/Throat: Oropharynx is clear and moist.  Eyes: Conjunctivae and EOM are normal.  Neck: Neck supple. No tracheal deviation present. No thyromegaly present.  No carotid bruit  Cardiovascular: Normal rate, regular rhythm and normal heart sounds.   2/6 systolic murmur heard.  No edema. Pulmonary/Chest: Effort normal and breath sounds normal. No respiratory distress. She has no wheezes. She has no rales.  Breast: deferred to Gyn Abdominal: Soft. She exhibits no distension. There is no tenderness.  Lymphadenopathy: She has no cervical adenopathy.  Skin: Skin is warm and dry. She is not diaphoretic.  Psychiatric: She has a normal mood and affect. Her behavior is normal.         Assessment & Plan:   Wellness Exam: Immunizations  discussed pneumonia, shingles vaccines Colonoscopy   Up to date  Mammogram  Up to date  Dexa - due, done by gyn Gyn - Up to date  Eye exam  Up to date  Hearing loss mild - not interested in referral at this time Memory concerns/difficulties - mild, compensating, will monitor Independent of ADLs  fully Stressed the importance of regular exercise   Patient received copy of preventative screening tests/immunizations recommended for the next 5-10 years.   Physical exam: Screening blood work  ordered Immunizations discussed pneumonia, shingles vaccines Colonoscopy   Up to date  Mammogram  Up to date  Gyn  Up to date  Dexa - due  - done by Gyn Eye exams  Up to date  EKG - will hold off - getting Echo Exercise  - regular Weight -  normal BMI Skin - no concerns Substance abuse   none  See Problem List for Assessment and  Plan of chronic medical problems.   FU annually

## 2016-09-29 NOTE — Assessment & Plan Note (Signed)
Has follow up MRI soon at [facility]

## 2016-09-29 NOTE — Assessment & Plan Note (Signed)
Controlled, stable Continue current dose of medication  

## 2016-09-29 NOTE — Assessment & Plan Note (Signed)
echo

## 2016-09-29 NOTE — Assessment & Plan Note (Signed)
Check tsh  Titrate med dose if needed  

## 2016-09-29 NOTE — Assessment & Plan Note (Signed)
Will schedule dexa Taking fosamax - managed by gyn Taking calcium/vitamin d

## 2016-10-07 ENCOUNTER — Other Ambulatory Visit: Payer: Self-pay | Admitting: Obstetrics & Gynecology

## 2016-10-07 ENCOUNTER — Encounter: Payer: Self-pay | Admitting: Internal Medicine

## 2016-10-07 ENCOUNTER — Other Ambulatory Visit (INDEPENDENT_AMBULATORY_CARE_PROVIDER_SITE_OTHER): Payer: Medicare Other

## 2016-10-07 DIAGNOSIS — Z1231 Encounter for screening mammogram for malignant neoplasm of breast: Secondary | ICD-10-CM

## 2016-10-07 DIAGNOSIS — Z0000 Encounter for general adult medical examination without abnormal findings: Secondary | ICD-10-CM | POA: Diagnosis not present

## 2016-10-07 LAB — CBC WITH DIFFERENTIAL/PLATELET
Basophils Absolute: 0 10*3/uL (ref 0.0–0.1)
Basophils Relative: 0.4 % (ref 0.0–3.0)
Eosinophils Absolute: 0.3 10*3/uL (ref 0.0–0.7)
Eosinophils Relative: 4.7 % (ref 0.0–5.0)
HCT: 40 % (ref 36.0–46.0)
Hemoglobin: 13.8 g/dL (ref 12.0–15.0)
Lymphocytes Relative: 19.7 % (ref 12.0–46.0)
Lymphs Abs: 1.2 10*3/uL (ref 0.7–4.0)
MCHC: 34.5 g/dL (ref 30.0–36.0)
MCV: 92.5 fl (ref 78.0–100.0)
Monocytes Absolute: 0.8 10*3/uL (ref 0.1–1.0)
Monocytes Relative: 13.6 % — ABNORMAL HIGH (ref 3.0–12.0)
Neutro Abs: 3.6 10*3/uL (ref 1.4–7.7)
Neutrophils Relative %: 61.6 % (ref 43.0–77.0)
Platelets: 228 10*3/uL (ref 150.0–400.0)
RBC: 4.33 Mil/uL (ref 3.87–5.11)
RDW: 13.8 % (ref 11.5–15.5)
WBC: 5.8 10*3/uL (ref 4.0–10.5)

## 2016-10-07 LAB — LIPID PANEL
Cholesterol: 207 mg/dL — ABNORMAL HIGH (ref 0–200)
HDL: 93.2 mg/dL (ref >39.00)
LDL Cholesterol: 99 mg/dL (ref 0–99)
NonHDL: 114
Total CHOL/HDL Ratio: 2
Triglycerides: 73 mg/dL (ref 0.0–149.0)
VLDL: 14.6 mg/dL (ref 0.0–40.0)

## 2016-10-07 LAB — COMPREHENSIVE METABOLIC PANEL
ALT: 20 U/L (ref 0–35)
AST: 22 U/L (ref 0–37)
Albumin: 4.3 g/dL (ref 3.5–5.2)
Alkaline Phosphatase: 71 U/L (ref 39–117)
BUN: 16 mg/dL (ref 6–23)
CO2: 29 mEq/L (ref 19–32)
Calcium: 9.1 mg/dL (ref 8.4–10.5)
Chloride: 100 mEq/L (ref 96–112)
Creatinine, Ser: 0.79 mg/dL (ref 0.40–1.20)
GFR: 76.78 mL/min (ref >60.00)
Glucose, Bld: 95 mg/dL (ref 70–99)
Potassium: 3.9 mEq/L (ref 3.5–5.1)
Sodium: 135 mEq/L (ref 135–145)
Total Bilirubin: 0.3 mg/dL (ref 0.2–1.2)
Total Protein: 6.8 g/dL (ref 6.0–8.3)

## 2016-10-07 LAB — TSH: TSH: 1.1 u[IU]/mL (ref 0.35–4.50)

## 2016-10-12 ENCOUNTER — Other Ambulatory Visit: Payer: Self-pay | Admitting: Obstetrics & Gynecology

## 2016-10-13 ENCOUNTER — Other Ambulatory Visit (HOSPITAL_COMMUNITY): Payer: Medicare Other

## 2016-10-13 NOTE — Telephone Encounter (Signed)
Prescription for EEMT HS 0.625-1.25 mg #30, 4RF faxed to [facility] in [place]. Fax # [phone].

## 2016-10-13 NOTE — Telephone Encounter (Signed)
Medication refill request: EEMT HS Last AEX:  11-29-14  Last OV: 02-14-16  Next AEX: 05-21-17  Last MMG (if hormonal medication request): 11-19-15 WNL  Refill authorized: please advise

## 2016-10-23 ENCOUNTER — Other Ambulatory Visit: Payer: Self-pay

## 2016-10-23 ENCOUNTER — Ambulatory Visit (HOSPITAL_COMMUNITY): Payer: Medicare Other | Attending: Cardiovascular Disease

## 2016-10-23 DIAGNOSIS — Z87891 Personal history of nicotine dependence: Secondary | ICD-10-CM | POA: Diagnosis not present

## 2016-10-23 DIAGNOSIS — R011 Cardiac murmur, unspecified: Secondary | ICD-10-CM | POA: Diagnosis present

## 2016-10-25 ENCOUNTER — Encounter: Payer: Self-pay | Admitting: Internal Medicine

## 2016-11-19 ENCOUNTER — Ambulatory Visit
Admission: RE | Admit: 2016-11-19 | Discharge: 2016-11-19 | Disposition: A | Discharge status: Home / Self Care | Payer: Medicare Other | Source: Ambulatory Visit | Attending: Obstetrics & Gynecology | Admitting: Obstetrics & Gynecology

## 2016-11-19 DIAGNOSIS — M810 Age-related osteoporosis without current pathological fracture: Secondary | ICD-10-CM

## 2016-11-19 DIAGNOSIS — Z1231 Encounter for screening mammogram for malignant neoplasm of breast: Secondary | ICD-10-CM

## 2016-11-23 ENCOUNTER — Telehealth: Payer: Self-pay | Admitting: *Deleted

## 2016-11-23 NOTE — Telephone Encounter (Signed)
Patient returned call. Results given to patient and she verbalized understanding. Patient's questions answered, and declines need for OV at this time.   Routing to provider for final review. Patient agreeable to disposition. Will close encounter.

## 2016-11-23 NOTE — Telephone Encounter (Signed)
Message left to return call to Tayten Heber at [phone] to review BMD results.  

## 2016-11-23 NOTE — Telephone Encounter (Signed)
-----   Message from Megan Salon, MD sent at 11/21/2016  8:40 AM EDT ----- Please let pt know her bone density was a little better in the spine and still just shows osteopenia.  I think she could do a drug holiday and stop the Fosamax for two years and repeat the bone density then.  If she has many questions, ok for OV.

## 2016-12-19 ENCOUNTER — Other Ambulatory Visit: Payer: Self-pay | Admitting: Internal Medicine

## 2016-12-25 ENCOUNTER — Ambulatory Visit: Payer: Medicare Other | Admitting: Internal Medicine

## 2017-01-23 ENCOUNTER — Other Ambulatory Visit: Payer: Self-pay | Admitting: Internal Medicine

## 2017-01-23 DIAGNOSIS — F32A Depression, unspecified: Secondary | ICD-10-CM

## 2017-01-23 DIAGNOSIS — F329 Major depressive disorder, single episode, unspecified: Secondary | ICD-10-CM

## 2017-02-27 IMAGING — CT CT MAXILLOFACIAL W/O CM
3 series · 16 of 40 positions shown, 19 images · non-contrast
Comparison: None.

CLINICAL DATA: Sinus congestion and headaches for 6 months. Facial
pressure, cough, and drainage.

EXAM:
CT MAXILLOFACIAL WITHOUT CONTRAST
TECHNIQUE: Multidetector CT imaging of the maxillofacial structures was
performed. Multiplanar CT image reconstructions were also generated.
A small metallic BB was placed on the right temple in order to
reliably differentiate right from left.

[Series 3: axial soft 1.25 · axial · 0.49mm/px · z∈[-41,+22]mm · 4 of 153 slices shown]
[im 17/153  brain]
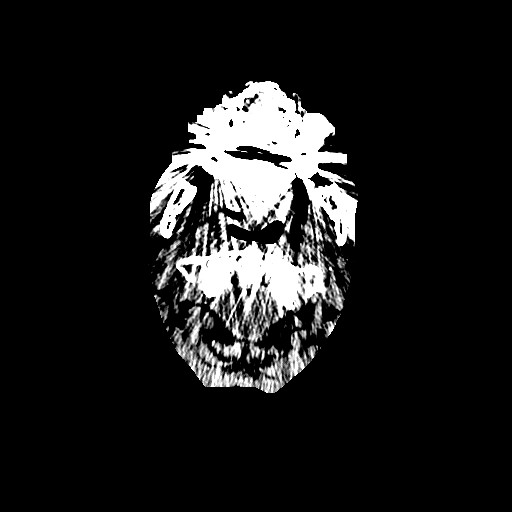
[im 34/153  brain]
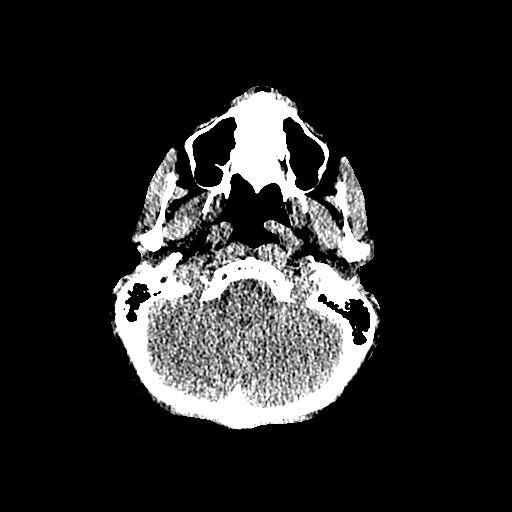
[im 51/153  brain]
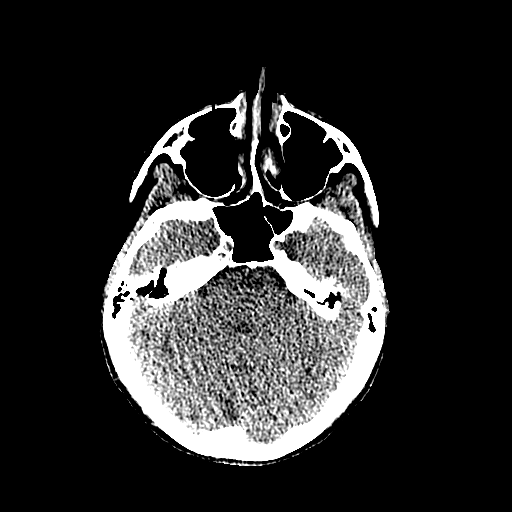
[im 68/153  brain]
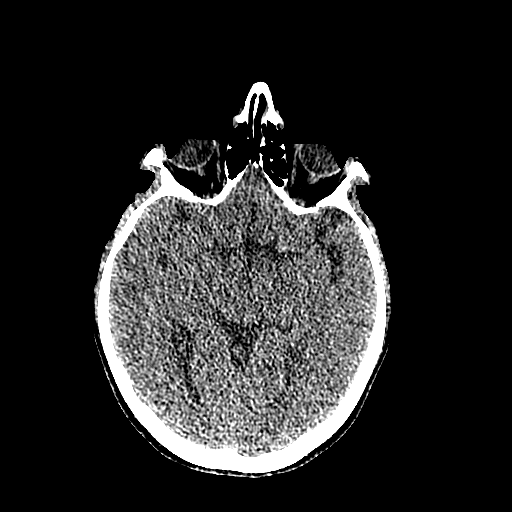

[Series 601: coronal facial · coronal · 0.49mm/px · 3 of 110 slices shown]
[im 37/110  bone]
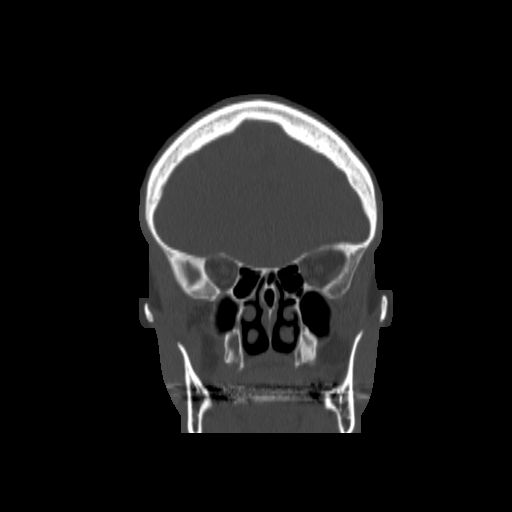
[im 49/110  bone]
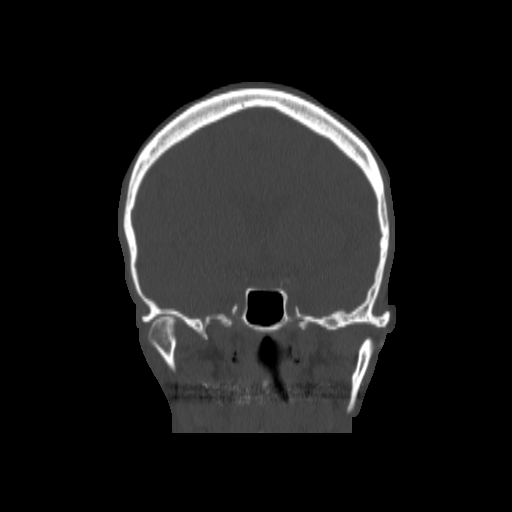
[im 61/110  bone]
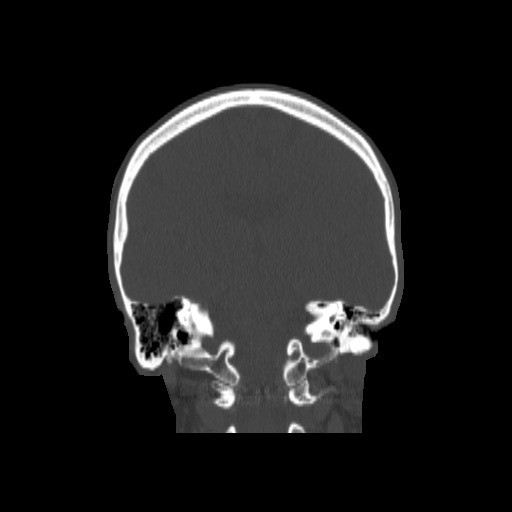

[Series 602: sagittal facial · sagittal · 0.49mm/px · 9 of 88 slices shown, 12 images]
[im 9/88  brain]
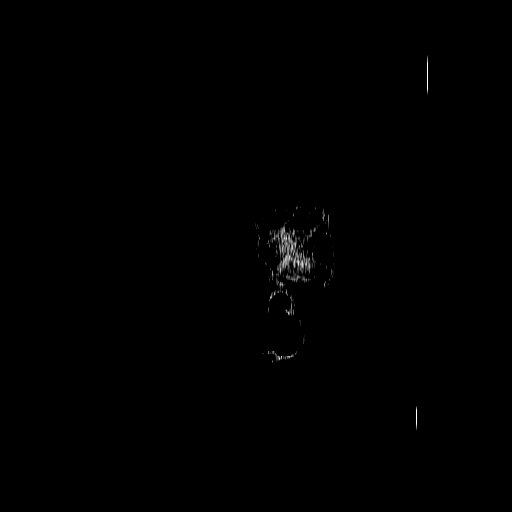
[im 9/88  bone]
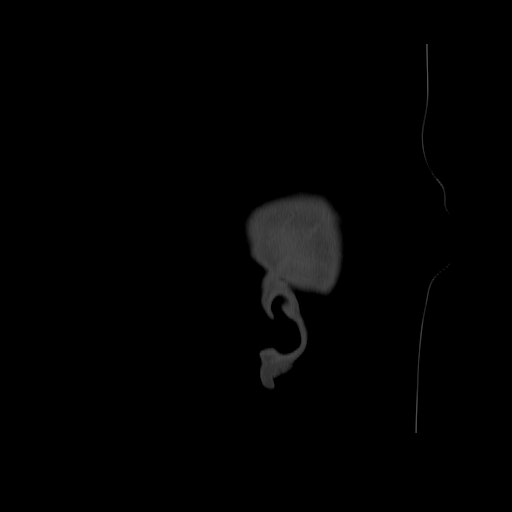
[im 18/88  bone]
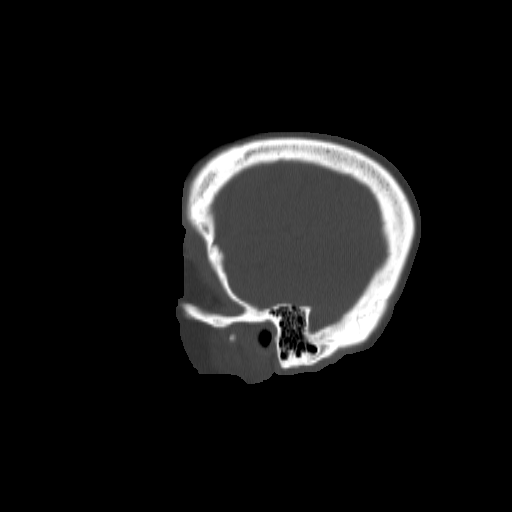
[im 27/88  bone]
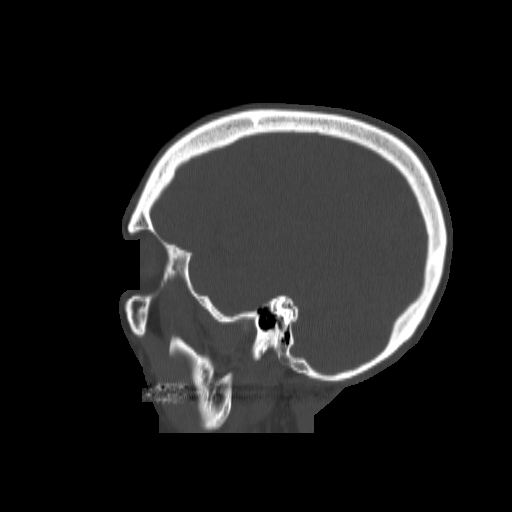
[im 35/88  bone]
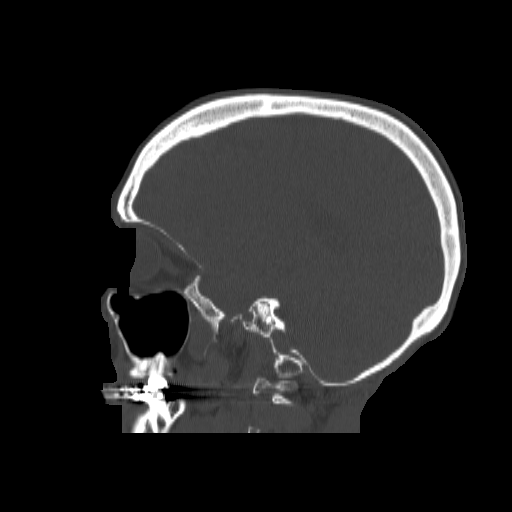
[im 44/88  brain]
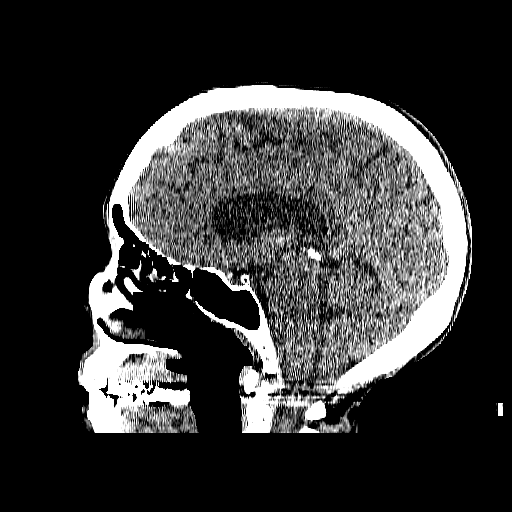
[im 44/88  bone]
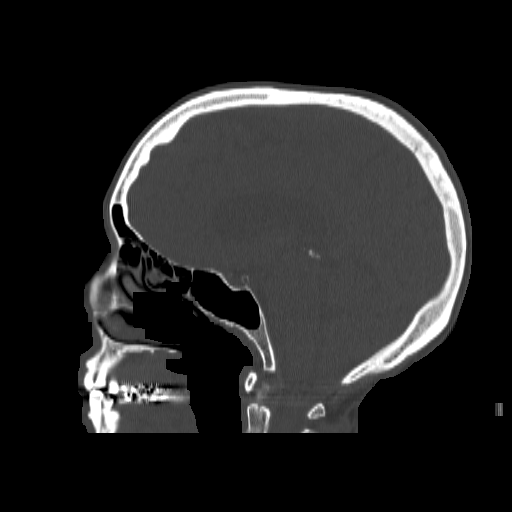
[im 53/88  bone]
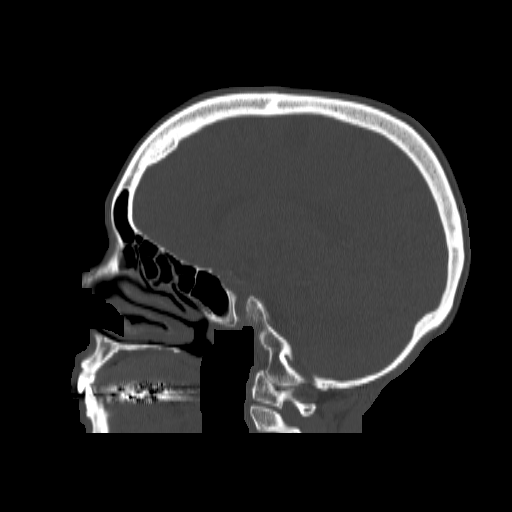
[im 61/88  bone]
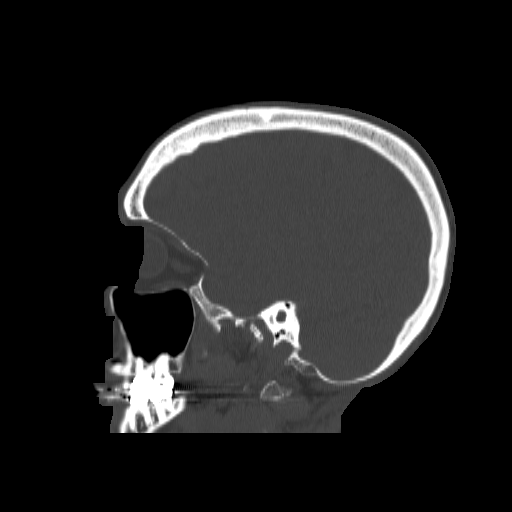
[im 70/88  bone]
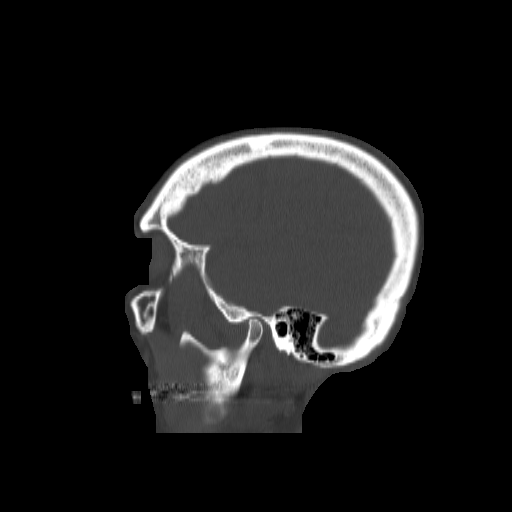
[im 79/88  brain]
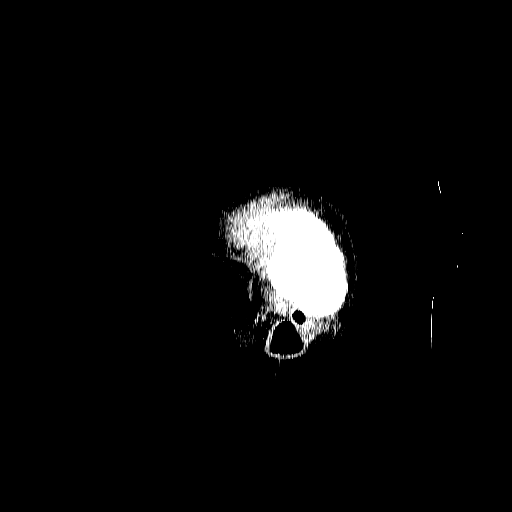
[im 79/88  bone]
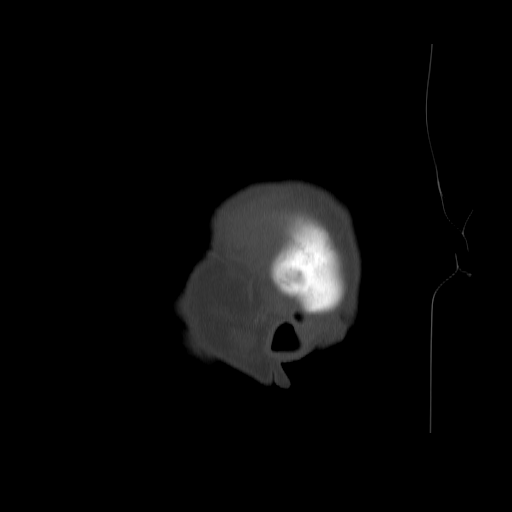

[16 of 40 positions shown; findings below may reference images not displayed]

FINDINGS: There is very mild mucosal thickening involving the right greater
than left ethmoid air cells. Trace mucosal thickening is present in
the alveolar recess of the right maxillary sinus. The other
paranasal sinuses are clear. There are no fluid levels. The
ostiomeatal complexes, frontal recesses, and sphenoethmoidal
recesses are patent. There is a small left middle turbinate concha
bullosa. Rightward nasal septal deviation measures 4 mm anteriorly.
There is a small right-sided nasal septal spur more posteriorly.

The mastoid air cells are clear. A small amount of debris/cerumen is
noted in the external auditory canals. There is a 5 mm round
hyperdense focus in the superior aspect of the third ventricle at
the level of the foramina of [place]. There is no hydrocephalus.
IMPRESSION: 1. Very mild paranasal sinus mucosal thickening, greatest in the
right ethmoid sinus. No fluid.
2. Rightward nasal septal deviation.
3. 5 mm lesion superiorly in the third ventricle most consistent
with a colloid cyst. No hydrocephalus. Brain MRI could be performed
for further evaluation.

## 2017-03-12 ENCOUNTER — Other Ambulatory Visit: Payer: Self-pay | Admitting: Internal Medicine

## 2017-03-15 ENCOUNTER — Encounter: Payer: Self-pay | Admitting: Internal Medicine

## 2017-03-29 ENCOUNTER — Encounter: Payer: Self-pay | Admitting: Internal Medicine

## 2017-03-29 ENCOUNTER — Ambulatory Visit (INDEPENDENT_AMBULATORY_CARE_PROVIDER_SITE_OTHER): Payer: Medicare Other | Admitting: Internal Medicine

## 2017-03-29 VITALS — BP 118/76 | HR 68 | Temp 98.5°F | Resp 16 | Wt 114.0 lb

## 2017-03-29 DIAGNOSIS — L989 Disorder of the skin and subcutaneous tissue, unspecified: Secondary | ICD-10-CM | POA: Insufficient documentation

## 2017-03-29 NOTE — Assessment & Plan Note (Signed)
Both areas appear benign and possibly post traumatic She will continue to monitor them Reassured her neither appear to be precancerous or cancerous If they do not resolve she can see a dermatologist

## 2017-03-29 NOTE — Patient Instructions (Signed)
Continue to monitor your skin closely.  I do not see anything concerning - there are no signs of skin cancer.

## 2017-03-29 NOTE — Progress Notes (Signed)
Subjective:    Patient ID: Jo Gomez, female    DOB: 1945-12-15, 71 y.o.   MRN: [id number]  HPI She is here for an acute visit.    Skin abnormality:  About one week ago she noticed a skin abnormality on her left anterior forearm. The area is red, slightly scaly and itching. It is approximately half of a centimeter in size. She denies any changes since she noticed it. She denies any bug bites and she does work outside regularly. Her skin doesn't bruise easily from being on steroids for a long period of time. She typically wears protection on her lower arms. She wanted to have it evaluated and she would take a long time to get into a dermatologist.  She also noticed an area of concern near her left lateral wrist. It appears like a scar, but there appears to be a fluid pocket. It does not itch. It does not hurt. She denies any trauma to the area that she is aware of.  Medications and allergies reviewed with patient and updated if appropriate.  Patient Active Problem List   Diagnosis Date Noted  . Cardiac murmur 09/29/2016  . Intractable migraine without aura and with status migrainosus 08/09/2016  . Pain of both shoulder joints 06/24/2016  . Biceps tendonitis 06/19/2016  . Yeast infection of the skin 04/29/2016  . 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 Chronic frontal sinusitis 02/06/2016  . Insomnia 09/16/2015  . Low back pain radiating to left leg 06/25/2014  . 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 HIATAL HERNIA WITH REFLUX 07/09/2010  . Vitamin B12 deficiency 03/05/2010  . Esophageal reflux 03/04/2010  . 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 Hypothyroidism 09/05/2007  . Allergic rhinitis 09/05/2007  . Depression 10/21/2006  . Osteopenia 10/21/2006    Current Outpatient Prescriptions on File Prior to Visit  Medication Sig Dispense Refill  . acyclovir (ZOVIRAX) 400 MG tablet One tablet 4 times daily when necessary 50  tablet 3  . alendronate (FOSAMAX) 10 MG tablet Take 1 tablet (10 mg total) by mouth daily before breakfast. Take with a full glass of water on an empty stomach. 90 tablet 4  . cyanocobalamin (,VITAMIN B-12,) 1000 MCG/ML injection INJECT 1 ML UNDER THE SKIN EVERY 30 DAYS 30 mL 1  . EEMT HS 0.625-1.25 MG tablet TAKE 1 TABLET BY MOUTH DAILY 30 tablet 4  . fluticasone (FLONASE) 50 MCG/ACT nasal spray USE 2 SPRAYS IN EACH NOSTRIL QD AT NIGHT  5  . levothyroxine (SYNTHROID, LEVOTHROID) 75 MCG tablet TAKE 1 TABLET BY MOUTH DAILY BEFORE BREAKFAST 90 tablet 1  . ondansetron (ZOFRAN ODT) 4 MG disintegrating tablet Take 1 tablet (4 mg total) by mouth every 8 (eight) hours as needed for nausea or vomiting. 20 tablet 0  . PROAIR HFA 108 (90 Base) MCG/ACT inhaler INHALE 2 PUFFS INTO THE LUNGS EVERY 6 HOURS AS NEEDED 8.5 g 11  . rizatriptan (MAXALT) 10 MG tablet Take 1 tablet (10 mg total) by mouth as needed for migraine. May repeat in 2 hours if needed 10 tablet 5  . SAFETY-LOK TB SYR 1CC/25GX5/8" 25G X 5/8" 1 ML MISC USE AS DIRECTED FOR B-12 INJECTIONS. 20 each 3  . sertraline (ZOLOFT) 100 MG tablet Take 1 tablet (100 mg total) by mouth daily. 90 tablet 3  . sertraline (ZOLOFT) 100 MG tablet TAKE 1 TABLET(100 MG) BY MOUTH DAILY 90 tablet 1  . sodium chloride (OCEAN) 0.65 %  SOLN nasal spray Place 1 spray into both nostrils as needed for congestion. 15 mL 0  . traZODone (DESYREL) 150 MG tablet TAKE 1 TABLET(150 MG) BY MOUTH AT BEDTIME 90 tablet 1  . Vitamin D, Ergocalciferol, (DRISDOL) 50000 units CAPS capsule TAKE 1 CAPSULE BY MOUTH AS DIRECTED 12 capsule 4   No current facility-administered medications on file prior to visit.     Past Medical History:  Diagnosis Date  . Allergy   . Anxiety   . 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 Asthma   . Asthma   . Depression   . GERD (gastroesophageal reflux disease)    diet controlled, no med  . History of endoscopy   . HSV infection   . IgA  deficiency ([place])   . Low vitamin B12 level   . Osteoporosis   . Ovarian cyst    TAH/BSO  . Thyroid dysfunction     Past Surgical History:  Procedure Laterality Date  . broken arm     surgery for this  . BROW LIFT  1/15  . COLONOSCOPY    . THYROIDECTOMY  2000  . TOTAL ABDOMINAL HYSTERECTOMY W/ BILATERAL SALPINGOOPHORECTOMY  1999    Social History   Social History  . Marital status: Significant Other    Spouse name: N/A  . Number of children: N/A  . Years of education: N/A   Social History Main Topics  . Smoking status: Former Smoker    Years: 3.00    Types: Cigarettes    Quit date: 08/24/1968  . Smokeless tobacco: Never Used     Comment: in college  . Alcohol use 2.4 oz/week    4 Glasses of wine per week     Comment: glass of wine  . Drug use: No  . Sexual activity: Yes    Partners: Female    Birth control/ protection: Surgical     Comment: TAH/BSO   Other Topics Concern  . Not on file   Social History Narrative  . No narrative on file    Family History  Problem Relation Age of Onset  . COPD Father        chronic lung infections  . Colon polyps Father   . Heart attack Mother   . Osteoporosis Mother   . Breast cancer Mother 72  . Lung cancer Mother   . Colon polyps Mother   . Colon cancer Neg Hx   . Esophageal cancer Neg Hx   . Stomach cancer Neg Hx   . Rectal cancer Neg Hx     Review of Systems  Constitutional: Negative for chills and fever.  Skin: Positive for color change. Negative for rash and wound.       Objective:   Vitals:   03/29/17 1625  BP: 118/76  Pulse: 68  Resp: 16  Temp: 98.5 F (36.9 C)   Filed Weights   03/29/17 1625  Weight: 114 lb (51.7 kg)   Body mass index is 22.26 kg/m.  Wt Readings from Last 3 Encounters:  03/29/17 114 lb (51.7 kg)  09/29/16 113 lb (51.3 kg)  08/07/16 110 lb (49.9 kg)     Physical Exam  Constitutional: She appears well-developed and well-nourished. No distress.  Skin: She is not  diaphoretic.  0.5 cm circular area of redness on the left anterior forearm, slight scaly appearance, slightly raised, nontender, no fluctuance, distinct borders; three-quarter centimeter scar like lesion left lateral wrist extremely small area of fluid-possible seroma-looks posttraumatic-no  swelling, erythema or pain          Assessment & Plan:   See Problem List for Assessment and Plan of chronic medical problems.

## 2017-05-12 ENCOUNTER — Telehealth: Payer: Self-pay | Admitting: Obstetrics & Gynecology

## 2017-05-12 NOTE — Telephone Encounter (Signed)
Left patient a message to call back to reschedule a future appointment that was cancelled by the provider for AEX.

## 2017-05-21 ENCOUNTER — Ambulatory Visit: Payer: Medicare Other | Admitting: Obstetrics & Gynecology

## 2017-05-24 NOTE — Progress Notes (Signed)
71 y.o. G1P1 Significant Other Caucasian F here for annual exam.  Doing well.  Denies vaginal bleeding.    Patient's last menstrual period was 08/24/1997.          Sexually active: Yes.    The current method of family planning is status post hysterectomy.   Exercising: Yes.    walking, yoga  Smoker:  Former smoker   Health Maintenance: Pap:  ?2004  History of abnormal Pap:  no MMG:  11/20/16 BIRADS 1 negative  Colonoscopy:  07/15/16 normal- repeat 5 years  BMD:   11/19/16 osteopenia.  Recheck 2 years. TDaP:  11/30/13  Pneumonia vaccine(s): 11/23/78, 08/24/96, 03/25/07, 06/25/14  Zostavax:   Never .  Had has shingles twice. Hep C testing: 09/18/15 negative  Screening Labs: PCP, Hb today: PCP   reports that she quit smoking about 48 years ago. Her smoking use included Cigarettes. She quit after 3.00 years of use. She has never used smokeless tobacco. She reports that she drinks about 2.4 oz of alcohol per week . She reports that she does not use drugs.  Past Medical History:  Diagnosis Date  . Allergy   . Anxiety   . 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 Asthma   . Asthma   . Depression   . GERD (gastroesophageal reflux disease)    diet controlled, no med  . History of endoscopy   . HSV infection   . IgA deficiency ([place])   . Low vitamin B12 level   . Osteoporosis   . Ovarian cyst    TAH/BSO  . Thyroid dysfunction     Past Surgical History:  Procedure Laterality Date  . broken arm     surgery for this  . BROW LIFT  1/15  . COLONOSCOPY    . THYROIDECTOMY  2000  . TOTAL ABDOMINAL HYSTERECTOMY W/ BILATERAL SALPINGOOPHORECTOMY  1999    Current Outpatient Prescriptions  Medication Sig Dispense Refill  . acyclovir (ZOVIRAX) 400 MG tablet One tablet 4 times daily when necessary 50 tablet 3  . alendronate (FOSAMAX) 10 MG tablet Take 1 tablet (10 mg total) by mouth daily before breakfast. Take with a full glass of water on an empty stomach. 90 tablet 4  . cyanocobalamin  (,VITAMIN B-12,) 1000 MCG/ML injection INJECT 1 ML UNDER THE SKIN EVERY 30 DAYS 30 mL 1  . EEMT HS 0.625-1.25 MG tablet TAKE 1 TABLET BY MOUTH DAILY (Patient taking differently: Take 1 tablet by mouth 3 (three) times a week. ) 30 tablet 4  . fluticasone (FLONASE) 50 MCG/ACT nasal spray USE 2 SPRAYS IN EACH NOSTRIL QD AT NIGHT  5  . levothyroxine (SYNTHROID, LEVOTHROID) 75 MCG tablet TAKE 1 TABLET BY MOUTH DAILY BEFORE BREAKFAST 90 tablet 1  . PROAIR HFA 108 (90 Base) MCG/ACT inhaler INHALE 2 PUFFS INTO THE LUNGS EVERY 6 HOURS AS NEEDED 8.5 g 11  . SAFETY-LOK TB SYR 1CC/25GX5/8" 25G X 5/8" 1 ML MISC USE AS DIRECTED FOR B-12 INJECTIONS. 20 each 3  . sertraline (ZOLOFT) 100 MG tablet Take 1 tablet (100 mg total) by mouth daily. 90 tablet 3  . sodium chloride (OCEAN) 0.65 % SOLN nasal spray Place 1 spray into both nostrils as needed for congestion. 15 mL 0  . traZODone (DESYREL) 150 MG tablet TAKE 1 TABLET(150 MG) BY MOUTH AT BEDTIME 90 tablet 1  . Vitamin D, Ergocalciferol, (DRISDOL) 50000 units CAPS capsule TAKE 1 CAPSULE BY MOUTH AS DIRECTED 12 capsule 4  No current facility-administered medications for this visit.     Family History  Problem Relation Age of Onset  . COPD Father        chronic lung infections  . Colon polyps Father   . Heart attack Mother   . Osteoporosis Mother   . Breast cancer Mother 80  . Lung cancer Mother   . Colon polyps Mother   . Colon cancer Neg Hx   . Esophageal cancer Neg Hx   . Stomach cancer Neg Hx   . Rectal cancer Neg Hx     ROS:  Pertinent items are noted in HPI.  Otherwise, a comprehensive ROS was negative.  Exam:   BP 120/60 (BP Location: Right Arm, Patient Position: Sitting, Cuff Size: Normal)   Pulse 66   Resp 12   Ht 5' (1.524 m)   Wt 114 lb (51.7 kg)   LMP 08/24/1997   BMI 22.26 kg/m     Height: 5' (152.4 cm)  Ht Readings from Last 3 Encounters:  05/25/17 5' (1.524 m)  09/29/16 5' (1.524 m)  07/28/16 5' (1.524 m)    General  appearance: alert, cooperative and appears stated age Head: Normocephalic, without obvious abnormality, atraumatic Neck: no adenopathy, supple, symmetrical, trachea midline and thyroid normal to inspection and palpation Lungs: clear to auscultation bilaterally Breasts: normal appearance, no masses or tenderness Heart: regular rate and rhythm Abdomen: soft, non-tender; bowel sounds normal; no masses,  no organomegaly Extremities: extremities normal, atraumatic, no cyanosis or edema Skin: Skin color, texture, turgor normal. No rashes or lesions Lymph nodes: Cervical, supraclavicular, and axillary nodes normal. No abnormal inguinal nodes palpated Neurologic: Grossly normal   Pelvic: External genitalia:  no lesions              Urethra:  normal appearing urethra with no masses, tenderness or lesions              Bartholins and Skenes: normal                 Vagina: normal appearing vagina with normal color and discharge, no lesions              Cervix: absent              Pap taken: No. Bimanual Exam:  Uterus:  uterus absent              Adnexa: no mass, fullness, tenderness               Rectovaginal: Confirms               Anus:  normal sphincter tone, no lesions  Chaperone was present for exam.  A:  Well Woman with normal exam TAH/BSO Osteopenia, on medication holiday from Fosamax H/O decreased libido  P:   Mammogram guidelines reviewed pap smear not indicated Plan repeat BMD in two years. Pt continues on Estratest HS.  Takes 1/2 to 1/3 tab about three times a week.  #30/5RF given Lab work done with Dr. Quay Burow in February return annually or prn

## 2017-05-25 ENCOUNTER — Encounter: Payer: Self-pay | Admitting: Obstetrics & Gynecology

## 2017-05-25 ENCOUNTER — Ambulatory Visit (INDEPENDENT_AMBULATORY_CARE_PROVIDER_SITE_OTHER): Payer: Medicare Other | Admitting: Obstetrics & Gynecology

## 2017-05-25 VITALS — BP 120/60 | HR 66 | Resp 12 | Ht 60.0 in | Wt 114.0 lb

## 2017-05-25 DIAGNOSIS — Z01419 Encounter for gynecological examination (general) (routine) without abnormal findings: Secondary | ICD-10-CM

## 2017-05-25 MED ORDER — EST ESTROGENS-METHYLTEST 0.625-1.25 MG PO TABS: 1.0000 | ORAL_TABLET | Freq: Every day | ORAL | 5 refills | Status: DC

## 2017-06-24 ENCOUNTER — Other Ambulatory Visit: Payer: Self-pay | Admitting: Internal Medicine

## 2017-07-17 ENCOUNTER — Other Ambulatory Visit: Payer: Self-pay | Admitting: Internal Medicine

## 2017-07-17 DIAGNOSIS — F329 Major depressive disorder, single episode, unspecified: Secondary | ICD-10-CM

## 2017-07-17 DIAGNOSIS — F32A Depression, unspecified: Secondary | ICD-10-CM

## 2017-07-24 ENCOUNTER — Other Ambulatory Visit: Payer: Self-pay | Admitting: Internal Medicine

## 2017-07-24 DIAGNOSIS — F329 Major depressive disorder, single episode, unspecified: Secondary | ICD-10-CM

## 2017-07-24 DIAGNOSIS — F32A Depression, unspecified: Secondary | ICD-10-CM

## 2017-07-26 NOTE — Telephone Encounter (Signed)
Please advise, last RX sent was only for 1 tablet daily.

## 2017-07-26 NOTE — Telephone Encounter (Signed)
Change to 100 mg daily

## 2017-07-27 ENCOUNTER — Encounter: Payer: Self-pay | Admitting: Internal Medicine

## 2017-07-27 ENCOUNTER — Other Ambulatory Visit: Payer: Self-pay | Admitting: Internal Medicine

## 2017-07-27 ENCOUNTER — Other Ambulatory Visit: Payer: Self-pay | Admitting: Emergency Medicine

## 2017-07-27 DIAGNOSIS — F32A Depression, unspecified: Secondary | ICD-10-CM

## 2017-07-27 DIAGNOSIS — F329 Major depressive disorder, single episode, unspecified: Secondary | ICD-10-CM

## 2017-08-03 ENCOUNTER — Other Ambulatory Visit: Payer: Self-pay | Admitting: Internal Medicine

## 2017-08-04 ENCOUNTER — Other Ambulatory Visit: Payer: Self-pay | Admitting: Internal Medicine

## 2017-09-06 ENCOUNTER — Other Ambulatory Visit: Payer: Self-pay

## 2017-09-10 ENCOUNTER — Other Ambulatory Visit: Payer: Self-pay | Admitting: Internal Medicine

## 2017-09-21 ENCOUNTER — Other Ambulatory Visit: Payer: Self-pay | Admitting: Internal Medicine

## 2017-10-02 NOTE — Progress Notes (Deleted)
Subjective:    Patient ID: Jo Gomez, female    DOB: 08-13-1946, 72 y.o.   MRN: [id number]  HPI She is here for an acute visit for cold symptoms.  Her symptoms started   She is experiencing  She has taken  Medications and allergies reviewed with patient and updated if appropriate.  Patient Active Problem List   Diagnosis Date Noted  . Skin abnormalities 03/29/2017  . Cardiac murmur 09/29/2016  . Intractable migraine without aura and with status migrainosus 08/09/2016  . Pain of both shoulder joints 06/24/2016  . Biceps tendonitis 06/19/2016  . Yeast infection of the skin 04/29/2016  . 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 Chronic frontal sinusitis 02/06/2016  . Insomnia 09/16/2015  . Low back pain radiating to left leg 06/25/2014  . 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 HIATAL HERNIA WITH REFLUX 07/09/2010  . Vitamin B12 deficiency 03/05/2010  . Esophageal reflux 03/04/2010  . 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 Hypothyroidism 09/05/2007  . Allergic rhinitis 09/05/2007  . Depression 10/21/2006  . Osteopenia 10/21/2006    Current Outpatient Medications on File Prior to Visit  Medication Sig Dispense Refill  . acyclovir (ZOVIRAX) 400 MG tablet One tablet 4 times daily when necessary 50 tablet 3  . cyanocobalamin (,VITAMIN B-12,) 1000 MCG/ML injection INJECT 1 ML UNDER THE SKIN EVERY 30 DAYS 30 mL 1  . estrogen-methylTESTOSTERone (EEMT HS) 0.625-1.25 MG tablet Take 1 tablet by mouth daily. 30 tablet 5  . fluticasone (FLONASE) 50 MCG/ACT nasal spray USE 2 SPRAYS IN EACH NOSTRIL QD AT NIGHT  5  . levothyroxine (SYNTHROID, LEVOTHROID) 75 MCG tablet TAKE 1 TABLET BY MOUTH DAILY BEFORE BREAKFAST 90 tablet 0  . PROAIR HFA 108 (90 Base) MCG/ACT inhaler INHALE 2 PUFFS INTO THE LUNGS EVERY 6 HOURS AS NEEDED 8.5 g 11  . SAFETY-LOK TB SYR 1CC/25GX5/8" 25G X 5/8" 1 ML MISC USE AS DIRECTED FOR B-12 INJECTIONS. 20 each 3  .  sertraline (ZOLOFT) 100 MG tablet Take 1 tablet (100 mg total) by mouth daily. 90 tablet 3  . sertraline (ZOLOFT) 100 MG tablet Take 1 tablet (100 mg total) by mouth daily. -- Office visit needed for further refills 90 tablet 0  . sodium chloride (OCEAN) 0.65 % SOLN nasal spray Place 1 spray into both nostrils as needed for congestion. 15 mL 0  . traZODone (DESYREL) 150 MG tablet TAKE 1 TABLET(150 MG) BY MOUTH AT BEDTIME 90 tablet 0  . TUBERCULIN SYR 1CC/25GX5/8" (SAFETY-LOK TB SYR 1CC/25GX5/8") 25G X 5/8" 1 ML MISC USE AS DIRECTED FOR B-12 INJECTIONS. 20 each 0  . Vitamin D, Ergocalciferol, (DRISDOL) 50000 units CAPS capsule TAKE 1 CAPSULE BY MOUTH AS DIRECTED 12 capsule 4   No current facility-administered medications on file prior to visit.     Past Medical History:  Diagnosis Date  . Allergy   . Anxiety   . 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 Asthma   . Depression   . GERD (gastroesophageal reflux disease)    diet controlled, no med  . History of endoscopy   . HSV infection   . IgA deficiency ([place])   . Low vitamin B12 level   . Osteoporosis   . Ovarian cyst    TAH/BSO  . Thyroid dysfunction     Past Surgical History:  Procedure Laterality Date  . broken arm     surgery for this  . BROW  LIFT  1/15  . COLONOSCOPY    . THYROIDECTOMY  2000  . TOTAL ABDOMINAL HYSTERECTOMY W/ BILATERAL SALPINGOOPHORECTOMY  1999    Social History   Socioeconomic History  . Marital status: Married    Spouse name: Not on file  . Number of children: Not on file  . Years of education: Not on file  . Highest education level: Not on file  Social Needs  . Financial resource strain: Not on file  . Food insecurity - worry: Not on file  . Food insecurity - inability: Not on file  . Transportation needs - medical: Not on file  . Transportation needs - non-medical: Not on file  Occupational History  . Not on file  Tobacco Use  . Smoking status: Former Smoker    Years: 3.00     Types: Cigarettes    Last attempt to quit: 08/24/1968    Years since quitting: 49.1  . Smokeless tobacco: Never Used  . Tobacco comment: in college  Substance and Sexual Activity  . Alcohol use: Yes    Alcohol/week: 2.4 oz    Types: 4 Glasses of wine per week    Comment: glass of wine  . Drug use: No  . Sexual activity: Yes    Partners: Female    Birth control/protection: Surgical    Comment: TAH/BSO  Other Topics Concern  . Not on file  Social History Narrative  . Not on file    Family History  Problem Relation Age of Onset  . COPD Father        chronic lung infections  . Colon polyps Father   . Heart attack Mother   . Osteoporosis Mother   . Breast cancer Mother 52  . Lung cancer Mother   . Colon polyps Mother   . Colon cancer Neg Hx   . Esophageal cancer Neg Hx   . Stomach cancer Neg Hx   . Rectal cancer Neg Hx     Review of Systems     Objective:  There were no vitals filed for this visit. There were no vitals filed for this visit. There is no height or weight on file to calculate BMI.  Wt Readings from Last 3 Encounters:  05/25/17 114 lb (51.7 kg)  03/29/17 114 lb (51.7 kg)  09/29/16 113 lb (51.3 kg)     Physical Exam GENERAL APPEARANCE: Appears stated age, well appearing, NAD EYES: conjunctiva clear, no icterus HEENT: bilateral tympanic membranes and ear canals normal, oropharynx with mild erythema, no thyromegaly, trachea midline, no cervical or supraclavicular lymphadenopathy LUNGS: Clear to auscultation without wheeze or crackles, unlabored breathing, good air entry bilaterally CARDIOVASCULAR: Normal S1,S2 without murmurs, no edema SKIN: warm, dry        Assessment & Plan:   See Problem List for Assessment and Plan of chronic medical problems.

## 2017-10-04 ENCOUNTER — Ambulatory Visit: Payer: Self-pay | Admitting: Internal Medicine

## 2017-10-24 ENCOUNTER — Other Ambulatory Visit: Payer: Self-pay | Admitting: Obstetrics & Gynecology

## 2017-10-25 NOTE — Telephone Encounter (Signed)
Medication refill request: Vitamin D Last AEX:  05-25-17  Next AEX: 07-25-18 Last MMG (if hormonal medication request): 11-19-16 WNL  Refill authorized: please advise

## 2017-10-26 ENCOUNTER — Other Ambulatory Visit: Payer: Self-pay | Admitting: Internal Medicine

## 2017-10-26 DIAGNOSIS — F32A Depression, unspecified: Secondary | ICD-10-CM

## 2017-10-26 DIAGNOSIS — F329 Major depressive disorder, single episode, unspecified: Secondary | ICD-10-CM

## 2017-11-04 NOTE — Patient Instructions (Addendum)
Test(s) ordered today. Your results will be released to [facility] (or called to you) after review, usually within 72hours after test completion. If any changes need to be made, you will be notified at that same time.  All other Health Maintenance issues reviewed.   All recommended immunizations and age-appropriate screenings are up-to-date or discussed.  No immunizations administered today.   Medications reviewed and updated.  Change include increasing the sertraline to 150 mg daily.   Your prescription(s) have been submitted to your pharmacy. Please take as directed and contact our office if you believe you are having problem(s) with the medication(s).   Please followup in one year   Health Maintenance, Female Adopting a healthy lifestyle and getting preventive care can go a long way to promote health and wellness. Talk with your health care provider about what schedule of regular examinations is right for you. This is a good chance for you to check in with your provider about disease prevention and staying healthy. In between checkups, there are plenty of things you can do on your own. Experts have done a lot of research about which lifestyle changes and preventive measures are most likely to keep you healthy. Ask your health care provider for more information. Weight and diet Eat a healthy diet  Be sure to include plenty of vegetables, fruits, low-fat dairy products, and lean protein.  Do not eat a lot of foods high in solid fats, added sugars, or salt.  Get regular exercise. This is one of the most important things you can do for your health. ? Most adults should exercise for at least 150 minutes each week. The exercise should increase your heart rate and make you sweat (moderate-intensity exercise). ? Most adults should also do strengthening exercises at least twice a week. This is in addition to the moderate-intensity exercise.  Maintain a healthy weight  Body mass index (BMI) is  a measurement that can be used to identify possible weight problems. It estimates body fat based on height and weight. Your health care provider can help determine your BMI and help you achieve or maintain a healthy weight.  For females 39 years of age and older: ? A BMI below 18.5 is considered underweight. ? A BMI of 18.5 to 24.9 is normal. ? A BMI of 25 to 29.9 is considered overweight. ? A BMI of 30 and above is considered obese.  Watch levels of cholesterol and blood lipids  You should start having your blood tested for lipids and cholesterol at 70 years of age, then have this test every 5 years.  You may need to have your cholesterol levels checked more often if: ? Your lipid or cholesterol levels are high. ? You are older than 70 years of age. ? You are at high risk for heart disease.  Cancer screening Lung Cancer  Lung cancer screening is recommended for adults 72-77 years old who are at high risk for lung cancer because of a history of smoking.  A yearly low-dose CT scan of the lungs is recommended for people who: ? Currently smoke. ? Have quit within the past 15 years. ? Have at least a 30-pack-year history of smoking. A pack year is smoking an average of one pack of cigarettes a day for 1 year.  Yearly screening should continue until it has been 15 years since you quit.  Yearly screening should stop if you develop a health problem that would prevent you from having lung cancer treatment.  Breast Cancer  Practice breast self-awareness. This means understanding how your breasts normally appear and feel.  It also means doing regular breast self-exams. Let your health care provider know about any changes, no matter how small.  If you are in your 20s or 30s, you should have a clinical breast exam (CBE) by a health care provider every 1-3 years as part of a regular health exam.  If you are 40 or older, have a CBE every year. Also consider having a breast X-ray (mammogram)  every year.  If you have a family history of breast cancer, talk to your health care provider about genetic screening.  If you are at high risk for breast cancer, talk to your health care provider about having an MRI and a mammogram every year.  Breast cancer gene (BRCA) assessment is recommended for women who have family members with BRCA-related cancers. BRCA-related cancers include: ? Breast. ? Ovarian. ? Tubal. ? Peritoneal cancers.  Results of the assessment will determine the need for genetic counseling and BRCA1 and BRCA2 testing.  Cervical Cancer Your health care provider may recommend that you be screened regularly for cancer of the pelvic organs (ovaries, uterus, and vagina). This screening involves a pelvic examination, including checking for microscopic changes to the surface of your cervix (Pap test). You may be encouraged to have this screening done every 3 years, beginning at age 21.  For women ages 30-65, health care providers may recommend pelvic exams and Pap testing every 3 years, or they may recommend the Pap and pelvic exam, combined with testing for human papilloma virus (HPV), every 5 years. Some types of HPV increase your risk of cervical cancer. Testing for HPV may also be done on women of any age with unclear Pap test results.  Other health care providers may not recommend any screening for nonpregnant women who are considered low risk for pelvic cancer and who do not have symptoms. Ask your health care provider if a screening pelvic exam is right for you.  If you have had past treatment for cervical cancer or a condition that could lead to cancer, you need Pap tests and screening for cancer for at least 20 years after your treatment. If Pap tests have been discontinued, your risk factors (such as having a new sexual partner) need to be reassessed to determine if screening should resume. Some women have medical problems that increase the chance of getting cervical  cancer. In these cases, your health care provider may recommend more frequent screening and Pap tests.  Colorectal Cancer  This type of cancer can be detected and often prevented.  Routine colorectal cancer screening usually begins at 70 years of age and continues through 70 years of age.  Your health care provider may recommend screening at an earlier age if you have risk factors for colon cancer.  Your health care provider may also recommend using home test kits to check for hidden blood in the stool.  A small camera at the end of a tube can be used to examine your colon directly (sigmoidoscopy or colonoscopy). This is done to check for the earliest forms of colorectal cancer.  Routine screening usually begins at age 50.  Direct examination of the colon should be repeated every 5-10 years through 70 years of age. However, you may need to be screened more often if early forms of precancerous polyps or small growths are found.  Skin Cancer  Check your skin from head to toe regularly.  Tell your health care   provider about any new moles or changes in moles, especially if there is a change in a mole's shape or color.  Also tell your health care provider if you have a mole that is larger than the size of a pencil eraser.  Always use sunscreen. Apply sunscreen liberally and repeatedly throughout the day.  Protect yourself by wearing long sleeves, pants, a wide-brimmed hat, and sunglasses whenever you are outside.  Heart disease, diabetes, and high blood pressure  High blood pressure causes heart disease and increases the risk of stroke. High blood pressure is more likely to develop in: ? People who have blood pressure in the high end of the normal range (130-139/85-89 mm Hg). ? People who are overweight or obese. ? People who are African American.  If you are 18-39 years of age, have your blood pressure checked every 3-5 years. If you are 40 years of age or older, have your blood  pressure checked every year. You should have your blood pressure measured twice-once when you are at a hospital or clinic, and once when you are not at a hospital or clinic. Record the average of the two measurements. To check your blood pressure when you are not at a hospital or clinic, you can use: ? An automated blood pressure machine at a pharmacy. ? A home blood pressure monitor.  If you are between 55 years and 79 years old, ask your health care provider if you should take aspirin to prevent strokes.  Have regular diabetes screenings. This involves taking a blood sample to check your fasting blood sugar level. ? If you are at a normal weight and have a low risk for diabetes, have this test once every three years after 70 years of age. ? If you are overweight and have a high risk for diabetes, consider being tested at a younger age or more often. Preventing infection Hepatitis B  If you have a higher risk for hepatitis B, you should be screened for this virus. You are considered at high risk for hepatitis B if: ? You were born in a country where hepatitis B is common. Ask your health care provider which countries are considered high risk. ? Your parents were born in a high-risk country, and you have not been immunized against hepatitis B (hepatitis B vaccine). ? You have HIV or AIDS. ? You use needles to inject street drugs. ? You live with someone who has hepatitis B. ? You have had sex with someone who has hepatitis B. ? You get hemodialysis treatment. ? You take certain medicines for conditions, including cancer, organ transplantation, and autoimmune conditions.  Hepatitis C  Blood testing is recommended for: ? Everyone born from 1945 through 1965. ? Anyone with known risk factors for hepatitis C.  Sexually transmitted infections (STIs)  You should be screened for sexually transmitted infections (STIs) including gonorrhea and chlamydia if: ? You are sexually active and are  younger than 70 years of age. ? You are older than 70 years of age and your health care provider tells you that you are at risk for this type of infection. ? Your sexual activity has changed since you were last screened and you are at an increased risk for chlamydia or gonorrhea. Ask your health care provider if you are at risk.  If you do not have HIV, but are at risk, it may be recommended that you take a prescription medicine daily to prevent HIV infection. This is called pre-exposure prophylaxis (PrEP). You   are considered at risk if: ? You are sexually active and do not regularly use condoms or know the HIV status of your partner(s). ? You take drugs by injection. ? You are sexually active with a partner who has HIV.  Talk with your health care provider about whether you are at high risk of being infected with HIV. If you choose to begin PrEP, you should first be tested for HIV. You should then be tested every 3 months for as long as you are taking PrEP. Pregnancy  If you are premenopausal and you may become pregnant, ask your health care provider about preconception counseling.  If you may become pregnant, take 400 to 800 micrograms (mcg) of folic acid every day.  If you want to prevent pregnancy, talk to your health care provider about birth control (contraception). Osteoporosis and menopause  Osteoporosis is a disease in which the bones lose minerals and strength with aging. This can result in serious bone fractures. Your risk for osteoporosis can be identified using a bone density scan.  If you are 65 years of age or older, or if you are at risk for osteoporosis and fractures, ask your health care provider if you should be screened.  Ask your health care provider whether you should take a calcium or vitamin D supplement to lower your risk for osteoporosis.  Menopause may have certain physical symptoms and risks.  Hormone replacement therapy may reduce some of these symptoms and  risks. Talk to your health care provider about whether hormone replacement therapy is right for you. Follow these instructions at home:  Schedule regular health, dental, and eye exams.  Stay current with your immunizations.  Do not use any tobacco products including cigarettes, chewing tobacco, or electronic cigarettes.  If you are pregnant, do not drink alcohol.  If you are breastfeeding, limit how much and how often you drink alcohol.  Limit alcohol intake to no more than 1 drink per day for nonpregnant women. One drink equals 12 ounces of beer, 5 ounces of wine, or 1 ounces of hard liquor.  Do not use street drugs.  Do not share needles.  Ask your health care provider for help if you need support or information about quitting drugs.  Tell your health care provider if you often feel depressed.  Tell your health care provider if you have ever been abused or do not feel safe at home. This information is not intended to replace advice given to you by your health care provider. Make sure you discuss any questions you have with your health care provider. Document Released: 02/23/2011 Document Revised: 01/16/2016 Document Reviewed: 05/14/2015 Elsevier Interactive Patient Education  2018 [employer]  

## 2017-11-04 NOTE — Progress Notes (Signed)
Subjective:    Patient ID: Jo Gomez, female    DOB: 07-26-1946, 72 y.o.   MRN: [id number]  HPI She is here for a physical exam.   She has no concerns.  She has been under increased stress lately and has not been exercising as much or eating as good as she should.   Medications and allergies reviewed with patient and updated if appropriate.  Patient Active Problem List   Diagnosis Date Noted  . Skin abnormalities 03/29/2017  . Cardiac murmur 09/29/2016  . Intractable migraine without aura and with status migrainosus 08/09/2016  . Pain of both shoulder joints 06/24/2016  . Biceps tendonitis 06/19/2016  . Yeast infection of the skin 04/29/2016  . 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 Chronic frontal sinusitis 02/06/2016  . Insomnia 09/16/2015  . Low back pain radiating to left leg 06/25/2014  . 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 HIATAL HERNIA WITH REFLUX 07/09/2010  . Vitamin B12 deficiency 03/05/2010  . Esophageal reflux 03/04/2010  . 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 Hypothyroidism 09/05/2007  . Allergic rhinitis 09/05/2007  . Depression 10/21/2006  . Osteopenia 10/21/2006    Current Outpatient Medications on File Prior to Visit  Medication Sig Dispense Refill  . acyclovir (ZOVIRAX) 400 MG tablet One tablet 4 times daily when necessary 50 tablet 3  . cyanocobalamin (,VITAMIN B-12,) 1000 MCG/ML injection INJECT 1 ML UNDER THE SKIN EVERY 30 DAYS 30 mL 1  . estrogen-methylTESTOSTERone (EEMT HS) 0.625-1.25 MG tablet Take 1 tablet by mouth daily. 30 tablet 5  . fluticasone (FLONASE) 50 MCG/ACT nasal spray USE 2 SPRAYS IN EACH NOSTRIL QD AT NIGHT  5  . levothyroxine (SYNTHROID, LEVOTHROID) 75 MCG tablet TAKE 1 TABLET BY MOUTH DAILY BEFORE BREAKFAST 90 tablet 0  . PROAIR HFA 108 (90 Base) MCG/ACT inhaler INHALE 2 PUFFS INTO THE LUNGS EVERY 6 HOURS AS NEEDED 8.5 g 11  . SAFETY-LOK TB SYR 1CC/25GX5/8" 25G X 5/8" 1  ML MISC USE AS DIRECTED FOR B-12 INJECTIONS. 20 each 3  . sertraline (ZOLOFT) 100 MG tablet TAKE 1 TABLET(100 MG) BY MOUTH DAILY 30 tablet 0  . sodium chloride (OCEAN) 0.65 % SOLN nasal spray Place 1 spray into both nostrils as needed for congestion. 15 mL 0  . traZODone (DESYREL) 150 MG tablet TAKE 1 TABLET(150 MG) BY MOUTH AT BEDTIME 90 tablet 0  . TUBERCULIN SYR 1CC/25GX5/8" (SAFETY-LOK TB SYR 1CC/25GX5/8") 25G X 5/8" 1 ML MISC USE AS DIRECTED FOR B-12 INJECTIONS. 20 each 0  . Vitamin D, Ergocalciferol, (DRISDOL) 50000 units CAPS capsule TAKE 1 CAPSULE BY MOUTH ONCE EVERY WEEK AS DIRECTED 12 capsule 3   No current facility-administered medications on file prior to visit.     Past Medical History:  Diagnosis Date  . Allergy   . Anxiety   . 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 Asthma   . Depression   . GERD (gastroesophageal reflux disease)    diet controlled, no med  . History of endoscopy   . HSV infection   . IgA deficiency ([facility])   . Low vitamin B12 level   . Osteoporosis   . Ovarian cyst    TAH/BSO  . Thyroid dysfunction     Past Surgical History:  Procedure Laterality Date  . broken arm     surgery for this  . BROW LIFT  1/15  . COLONOSCOPY    . THYROIDECTOMY  2000  . TOTAL ABDOMINAL HYSTERECTOMY W/ BILATERAL SALPINGOOPHORECTOMY  1999    Social History   Socioeconomic History  . Marital status: Married    Spouse name: None  . Number of children: None  . Years of education: None  . Highest education level: None  Social Needs  . Financial resource strain: None  . Food insecurity - worry: None  . Food insecurity - inability: None  . Transportation needs - medical: None  . Transportation needs - non-medical: None  Occupational History  . None  Tobacco Use  . Smoking status: Former Smoker    Years: 3.00    Types: Cigarettes    Last attempt to quit: 08/24/1968    Years since quitting: 49.2  . Smokeless tobacco: Never Used  . Tobacco comment: in  college  Substance and Sexual Activity  . Alcohol use: Yes    Alcohol/week: 2.4 oz    Types: 4 Glasses of wine per week    Comment: glass of wine  . Drug use: No  . Sexual activity: Yes    Partners: Female    Birth control/protection: Surgical    Comment: TAH/BSO  Other Topics Concern  . None  Social History Narrative  . None    Family History  Problem Relation Age of Onset  . COPD Father        chronic lung infections  . Colon polyps Father   . Heart attack Mother   . Osteoporosis Mother   . Breast cancer Mother 56  . Lung cancer Mother   . Colon polyps Mother   . Colon cancer Neg Hx   . Esophageal cancer Neg Hx   . Stomach cancer Neg Hx   . Rectal cancer Neg Hx     Review of Systems Constitutional: Negative for appetite change, chills, fatigue, fever and unexpected weight change.  Eyes: Negative for visual disturbance.  Respiratory: Positive for cough (first thing in morning only - PND). Negative for shortness of breath and wheezing.   Cardiovascular: Negative for chest pain, palpitations and leg swelling.  Gastrointestinal: Negative for abdominal pain, blood in stool, constipation, diarrhea and nausea.       Occ GERD  Genitourinary: Negative for dysuria and hematuria.  Musculoskeletal: Positive for arthralgias (b/l shoulder).  Skin: Negative for color change and rash.  Neurological: Negative for light-headedness and headaches.  Psychiatric/Behavioral: Positive for dysphoric mood (controlled). The patient is not nervous/anxious.      Objective:   Vitals:   11/05/17 0825  BP: 120/70  Pulse: 70  Resp: 16  Temp: 97.9 F (36.6 C)  SpO2: 96%   Filed Weights   11/05/17 0825  Weight: 118 lb (53.5 kg)   Body mass index is 21.58 kg/m.  Wt Readings from Last 3 Encounters:  11/05/17 118 lb (53.5 kg)  05/25/17 114 lb (51.7 kg)  03/29/17 114 lb (51.7 kg)     Physical Exam Constitutional: She appears well-developed and well-nourished. No distress.  HENT:    Head: Normocephalic and atraumatic.  Right Ear: External ear normal. Normal ear canal and TM Left Ear: External ear normal.  Normal ear canal and TM Mouth/Throat: Oropharynx is clear and moist.  Eyes: Conjunctivae and EOM are normal.  Neck: Neck supple. No tracheal deviation present. No thyromegaly present.  No carotid bruit  Cardiovascular: Normal rate, regular rhythm and normal heart sounds.   No murmur heard.  No edema. Pulmonary/Chest: Effort normal and breath sounds normal. No respiratory distress. She has no wheezes. She  has no rales.  Breast: deferred to Gyn Abdominal: Soft. She exhibits no distension. There is no tenderness.  Lymphadenopathy: She has no cervical adenopathy.  Skin: Skin is warm and dry. She is not diaphoretic.  Psychiatric: She has a normal mood and affect. Her behavior is normal.        Assessment & Plan:   Physical exam: Screening blood work  ordered Immunizations  Pneumovax today, shingrix discussed, others up to date Colonoscopy   Up to date  Mammogram   Up to date  Gyn   Up to date  Dexa   Up to date  Eye exams   Up to date  EKG   Last done 08/2015 Exercise  Regular, but not as much as she should Weight  Normal BMI Skin   No concerns Substance abuse   none  See Problem List for Assessment and Plan of chronic medical problems.

## 2017-11-05 ENCOUNTER — Ambulatory Visit (INDEPENDENT_AMBULATORY_CARE_PROVIDER_SITE_OTHER): Payer: Medicare Other | Admitting: Internal Medicine

## 2017-11-05 ENCOUNTER — Encounter: Payer: Self-pay | Admitting: Internal Medicine

## 2017-11-05 VITALS — BP 120/70 | HR 70 | Temp 97.9°F | Resp 16 | Ht 62.0 in | Wt 118.0 lb

## 2017-11-05 DIAGNOSIS — E890 Postprocedural hypothyroidism: Secondary | ICD-10-CM

## 2017-11-05 DIAGNOSIS — Z0001 Encounter for general adult medical examination with abnormal findings: Secondary | ICD-10-CM | POA: Diagnosis not present

## 2017-11-05 DIAGNOSIS — F329 Major depressive disorder, single episode, unspecified: Secondary | ICD-10-CM | POA: Diagnosis not present

## 2017-11-05 DIAGNOSIS — G4709 Other insomnia: Secondary | ICD-10-CM | POA: Diagnosis not present

## 2017-11-05 DIAGNOSIS — F3289 Other specified depressive episodes: Secondary | ICD-10-CM

## 2017-11-05 DIAGNOSIS — F32A Depression, unspecified: Secondary | ICD-10-CM

## 2017-11-05 DIAGNOSIS — Z0000 Encounter for general adult medical examination without abnormal findings: Secondary | ICD-10-CM

## 2017-11-05 DIAGNOSIS — B0089 Other herpesviral infection: Secondary | ICD-10-CM

## 2017-11-05 NOTE — Progress Notes (Deleted)
Subjective:    Patient ID: Jo Gomez, female    DOB: 10-26-1945, 72 y.o.   MRN: [id number]  HPI She is here for a physical exam.     Medications and allergies reviewed with patient and updated if appropriate.  Patient Active Problem List   Diagnosis Date Noted  . Skin abnormalities 03/29/2017  . Cardiac murmur 09/29/2016  . Intractable migraine without aura and with status migrainosus 08/09/2016  . Pain of both shoulder joints 06/24/2016  . Biceps tendonitis 06/19/2016  . Yeast infection of the skin 04/29/2016  . 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 Chronic frontal sinusitis 02/06/2016  . Insomnia 09/16/2015  . Low back pain radiating to left leg 06/25/2014  . 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 HIATAL HERNIA WITH REFLUX 07/09/2010  . Vitamin B12 deficiency 03/05/2010  . Esophageal reflux 03/04/2010  . 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 Hypothyroidism 09/05/2007  . Allergic rhinitis 09/05/2007  . Depression 10/21/2006  . Osteopenia 10/21/2006    Current Outpatient Medications on File Prior to Visit  Medication Sig Dispense Refill  . acyclovir (ZOVIRAX) 400 MG tablet One tablet 4 times daily when necessary 50 tablet 3  . cyanocobalamin (,VITAMIN B-12,) 1000 MCG/ML injection INJECT 1 ML UNDER THE SKIN EVERY 30 DAYS 30 mL 1  . estrogen-methylTESTOSTERone (EEMT HS) 0.625-1.25 MG tablet Take 1 tablet by mouth daily. 30 tablet 5  . fluticasone (FLONASE) 50 MCG/ACT nasal spray USE 2 SPRAYS IN EACH NOSTRIL QD AT NIGHT  5  . levothyroxine (SYNTHROID, LEVOTHROID) 75 MCG tablet TAKE 1 TABLET BY MOUTH DAILY BEFORE BREAKFAST 90 tablet 0  . PROAIR HFA 108 (90 Base) MCG/ACT inhaler INHALE 2 PUFFS INTO THE LUNGS EVERY 6 HOURS AS NEEDED 8.5 g 11  . SAFETY-LOK TB SYR 1CC/25GX5/8" 25G X 5/8" 1 ML MISC USE AS DIRECTED FOR B-12 INJECTIONS. 20 each 3  . sertraline (ZOLOFT) 100 MG tablet TAKE 1 TABLET(100 MG) BY MOUTH DAILY 30  tablet 0  . sodium chloride (OCEAN) 0.65 % SOLN nasal spray Place 1 spray into both nostrils as needed for congestion. 15 mL 0  . traZODone (DESYREL) 150 MG tablet TAKE 1 TABLET(150 MG) BY MOUTH AT BEDTIME 90 tablet 0  . TUBERCULIN SYR 1CC/25GX5/8" (SAFETY-LOK TB SYR 1CC/25GX5/8") 25G X 5/8" 1 ML MISC USE AS DIRECTED FOR B-12 INJECTIONS. 20 each 0  . Vitamin D, Ergocalciferol, (DRISDOL) 50000 units CAPS capsule TAKE 1 CAPSULE BY MOUTH ONCE EVERY WEEK AS DIRECTED 12 capsule 3   No current facility-administered medications on file prior to visit.     Past Medical History:  Diagnosis Date  . Allergy   . Anxiety   . 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 Asthma   . Depression   . GERD (gastroesophageal reflux disease)    diet controlled, no med  . History of endoscopy   . HSV infection   . IgA deficiency ([place])   . Low vitamin B12 level   . Osteoporosis   . Ovarian cyst    TAH/BSO  . Thyroid dysfunction     Past Surgical History:  Procedure Laterality Date  . broken arm     surgery for this  . BROW LIFT  1/15  . COLONOSCOPY    . THYROIDECTOMY  2000  . TOTAL ABDOMINAL HYSTERECTOMY W/ BILATERAL SALPINGOOPHORECTOMY  1999    Social History   Socioeconomic History  . Marital status: Married  Spouse name: None  . Number of children: None  . Years of education: None  . Highest education level: None  Social Needs  . Financial resource strain: None  . Food insecurity - worry: None  . Food insecurity - inability: None  . Transportation needs - medical: None  . Transportation needs - non-medical: None  Occupational History  . None  Tobacco Use  . Smoking status: Former Smoker    Years: 3.00    Types: Cigarettes    Last attempt to quit: 08/24/1968    Years since quitting: 49.2  . Smokeless tobacco: Never Used  . Tobacco comment: in college  Substance and Sexual Activity  . Alcohol use: Yes    Alcohol/week: 2.4 oz    Types: 4 Glasses of wine per week     Comment: glass of wine  . Drug use: No  . Sexual activity: Yes    Partners: Female    Birth control/protection: Surgical    Comment: TAH/BSO  Other Topics Concern  . None  Social History Narrative  . None    Family History  Problem Relation Age of Onset  . COPD Father        chronic lung infections  . Colon polyps Father   . Heart attack Mother   . Osteoporosis Mother   . Breast cancer Mother 12  . Lung cancer Mother   . Colon polyps Mother   . Colon cancer Neg Hx   . Esophageal cancer Neg Hx   . Stomach cancer Neg Hx   . Rectal cancer Neg Hx     Review of Systems  Constitutional: Negative for appetite change, chills, fatigue, fever and unexpected weight change.  Eyes: Negative for visual disturbance.  Respiratory: Positive for cough (first thing in morning only - PND). Negative for shortness of breath and wheezing.   Cardiovascular: Negative for chest pain, palpitations and leg swelling.  Gastrointestinal: Negative for abdominal pain, blood in stool, constipation, diarrhea and nausea.       Occ GERD  Genitourinary: Negative for dysuria and hematuria.  Musculoskeletal: Positive for arthralgias (b/l shoulder).  Skin: Negative for color change and rash.  Neurological: Negative for light-headedness and headaches.  Psychiatric/Behavioral: Positive for dysphoric mood (controlled). The patient is not nervous/anxious.        Objective:   Vitals:   11/05/17 0825  BP: 120/70  Pulse: 70  Resp: 16  Temp: 97.9 F (36.6 C)  SpO2: 96%   Filed Weights   11/05/17 0825  Weight: 118 lb (53.5 kg)   Body mass index is 21.58 kg/m.  Wt Readings from Last 3 Encounters:  11/05/17 118 lb (53.5 kg)  05/25/17 114 lb (51.7 kg)  03/29/17 114 lb (51.7 kg)     Physical Exam Constitutional: She appears well-developed and well-nourished. No distress.  HENT:  Head: Normocephalic and atraumatic.  Right Ear: External ear normal. Normal ear canal and TM Left Ear: External ear  normal.  Normal ear canal and TM Mouth/Throat: Oropharynx is clear and moist.  Eyes: Conjunctivae and EOM are normal.  Neck: Neck supple. No tracheal deviation present. No thyromegaly present.  No carotid bruit  Cardiovascular: Normal rate, regular rhythm and normal heart sounds.   No murmur heard.  No edema. Pulmonary/Chest: Effort normal and breath sounds normal. No respiratory distress. She has no wheezes. She has no rales.  Breast: deferred to Gyn Abdominal: Soft. She exhibits no distension. There is no tenderness.  Lymphadenopathy: She has no cervical adenopathy.  Skin: Skin is warm and dry. She is not diaphoretic.  Psychiatric: She has a normal mood and affect. Her behavior is normal.        Assessment & Plan:   Physical exam: Screening blood work  ordered Immunizations  Pneumovax today, shingrix discussed, others up to date Colonoscopy   Up to date  Mammogram   Up to date  Gyn Dexa   Up to date  Eye exams EKG   Last done 08/2015 Exercise Weight Skin   Derm prn - no concerns Substance abuse   none    See Problem List for Assessment and Plan of chronic medical problems.

## 2017-11-06 MED ORDER — "TUBERCULIN SYRINGE 25G X 5/8"" 1 ML MISC": 3 refills | Status: DC

## 2017-11-06 MED ORDER — TRAZODONE HCL 150 MG PO TABS: ORAL_TABLET | ORAL | 3 refills | Status: DC

## 2017-11-06 MED ORDER — CYANOCOBALAMIN 1000 MCG/ML IJ SOLN: INTRAMUSCULAR | 1 refills | Status: DC

## 2017-11-06 MED ORDER — SERTRALINE HCL 100 MG PO TABS: 150.0000 mg | ORAL_TABLET | Freq: Every day | ORAL | 3 refills | Status: DC

## 2017-11-06 MED ORDER — VALACYCLOVIR HCL 1 G PO TABS
ORAL_TABLET | ORAL | 8 refills | Status: DC
Start: 2017-11-06 — End: 2018-11-14

## 2017-11-06 NOTE — Assessment & Plan Note (Signed)
Controlled, stable Continue current dose of medication  

## 2017-11-06 NOTE — Assessment & Plan Note (Signed)
Uses acyclovir prn Will try valtrex

## 2017-11-06 NOTE — Assessment & Plan Note (Signed)
Check tsh  Titrate med dose if needed  

## 2017-11-06 NOTE — Assessment & Plan Note (Signed)
Will try increasing sertraline to 150 mg daily for increase stress - having increased anxiety and depression

## 2017-11-13 ENCOUNTER — Other Ambulatory Visit: Payer: Self-pay | Admitting: Internal Medicine

## 2017-11-15 ENCOUNTER — Encounter: Payer: Self-pay | Admitting: Internal Medicine

## 2017-11-15 ENCOUNTER — Other Ambulatory Visit (INDEPENDENT_AMBULATORY_CARE_PROVIDER_SITE_OTHER): Payer: Medicare Other

## 2017-11-15 DIAGNOSIS — E890 Postprocedural hypothyroidism: Secondary | ICD-10-CM

## 2017-11-15 DIAGNOSIS — Z0000 Encounter for general adult medical examination without abnormal findings: Secondary | ICD-10-CM | POA: Diagnosis not present

## 2017-11-15 LAB — COMPREHENSIVE METABOLIC PANEL
ALT: 22 U/L (ref 0–35)
AST: 23 U/L (ref 0–37)
Albumin: 4 g/dL (ref 3.5–5.2)
Alkaline Phosphatase: 53 U/L (ref 39–117)
BUN: 13 mg/dL (ref 6–23)
CO2: 27 mEq/L (ref 19–32)
Calcium: 9.2 mg/dL (ref 8.4–10.5)
Chloride: 104 mEq/L (ref 96–112)
Creatinine, Ser: 0.84 mg/dL (ref 0.40–1.20)
GFR: 71.3 mL/min (ref >60.00)
Glucose, Bld: 104 mg/dL — ABNORMAL HIGH (ref 70–99)
Potassium: 4.9 mEq/L (ref 3.5–5.1)
Sodium: 139 mEq/L (ref 135–145)
Total Bilirubin: 0.4 mg/dL (ref 0.2–1.2)
Total Protein: 7 g/dL (ref 6.0–8.3)

## 2017-11-15 LAB — LIPID PANEL
Cholesterol: 167 mg/dL (ref 0–200)
HDL: 66 mg/dL (ref >39.00)
LDL Cholesterol: 83 mg/dL (ref 0–99)
NonHDL: 100.69
Total CHOL/HDL Ratio: 3
Triglycerides: 86 mg/dL (ref 0.0–149.0)
VLDL: 17.2 mg/dL (ref 0.0–40.0)

## 2017-11-15 LAB — CBC WITH DIFFERENTIAL/PLATELET
Basophils Absolute: 0 10*3/uL (ref 0.0–0.1)
Basophils Relative: 0.6 % (ref 0.0–3.0)
Eosinophils Absolute: 0.5 10*3/uL (ref 0.0–0.7)
Eosinophils Relative: 8.1 % — ABNORMAL HIGH (ref 0.0–5.0)
HCT: 39.2 % (ref 36.0–46.0)
Hemoglobin: 13.4 g/dL (ref 12.0–15.0)
Lymphocytes Relative: 24.2 % (ref 12.0–46.0)
Lymphs Abs: 1.4 10*3/uL (ref 0.7–4.0)
MCHC: 34.2 g/dL (ref 30.0–36.0)
MCV: 93.5 fl (ref 78.0–100.0)
Monocytes Absolute: 0.7 10*3/uL (ref 0.1–1.0)
Monocytes Relative: 11.9 % (ref 3.0–12.0)
Neutro Abs: 3.2 10*3/uL (ref 1.4–7.7)
Neutrophils Relative %: 55.2 % (ref 43.0–77.0)
Platelets: 249 10*3/uL (ref 150.0–400.0)
RBC: 4.19 Mil/uL (ref 3.87–5.11)
RDW: 13.4 % (ref 11.5–15.5)
WBC: 5.8 10*3/uL (ref 4.0–10.5)

## 2017-11-15 LAB — TSH: TSH: 2.21 u[IU]/mL (ref 0.35–4.50)

## 2017-11-28 NOTE — Progress Notes (Signed)
Subjective:    Patient ID: Jo Gomez, female    DOB: 01/17/1946, 72 y.o.   MRN: [id number]  HPI She is here for an acute visit for cold symptoms - possible sinus infection.  Her symptoms started last week.  She does have a history of sinus infections and because of her IgA deficiency there is sometimes difficult to treat.  She also has allergies that are seasonal and have been worse recently.  She is experiencing fatigue, chills, nasal congestion, intermittent ear pain, postnasal drip, sinus pressure, cough from the postnasal drip and headaches.  She does not think she is had any fevers and denies sore throat, shortness of breath, wheezing and lightheadedness.  She has taken Flonase and her allergy medications..    Medications and allergies reviewed with patient and updated if appropriate.  Patient Active Problem List   Diagnosis Date Noted  . Skin abnormalities 03/29/2017  . Cardiac murmur 09/29/2016  . Intractable migraine without aura and with status migrainosus 08/09/2016  . Pain of both shoulder joints 06/24/2016  . Biceps tendonitis 06/19/2016  . 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 Chronic frontal sinusitis 02/06/2016  . Insomnia 09/16/2015  . Low back pain radiating to left leg 06/25/2014  . 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 HIATAL HERNIA WITH REFLUX 07/09/2010  . Vitamin B12 deficiency 03/05/2010  . Esophageal reflux 03/04/2010  . 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 Hypothyroidism 09/05/2007  . Allergic rhinitis 09/05/2007  . Depression 10/21/2006  . Osteopenia 10/21/2006    Current Outpatient Medications on File Prior to Visit  Medication Sig Dispense Refill  . acyclovir (ZOVIRAX) 400 MG tablet One tablet 4 times daily when necessary 50 tablet 3  . cyanocobalamin (,VITAMIN B-12,) 1000 MCG/ML injection INJECT 1 ML UNDER THE SKIN EVERY 30 DAYS 30 mL 1  . estrogen-methylTESTOSTERone (EEMT HS)  0.625-1.25 MG tablet Take 1 tablet by mouth daily. 30 tablet 5  . fluticasone (FLONASE) 50 MCG/ACT nasal spray USE 2 SPRAYS IN EACH NOSTRIL QD AT NIGHT  5  . levothyroxine (SYNTHROID, LEVOTHROID) 75 MCG tablet TAKE 1 TABLET BY MOUTH DAILY BEFORE BREAKFAST 90 tablet 0  . PROAIR HFA 108 (90 Base) MCG/ACT inhaler INHALE 2 PUFFS INTO THE LUNGS EVERY 6 HOURS AS NEEDED 8.5 g 11  . sertraline (ZOLOFT) 100 MG tablet Take 1.5 tablets (150 mg total) by mouth daily. 135 tablet 3  . sodium chloride (OCEAN) 0.65 % SOLN nasal spray Place 1 spray into both nostrils as needed for congestion. 15 mL 0  . traZODone (DESYREL) 150 MG tablet TAKE 1 TABLET(150 MG) BY MOUTH AT BEDTIME 90 tablet 3  . TUBERCULIN SYR 1CC/25GX5/8" (SAFETY-LOK TB SYR 1CC/25GX5/8") 25G X 5/8" 1 ML MISC USE AS DIRECTED FOR B-12 INJECTIONS. 20 each 0  . TUBERCULIN SYR 1CC/25GX5/8" (SAFETY-LOK TB SYR 1CC/25GX5/8") 25G X 5/8" 1 ML MISC USE AS DIRECTED FOR B-12 INJECTIONS. 20 each 3  . valACYclovir (VALTREX) 1000 MG tablet Take 2000 mg Q 12 hrs x 1 day prn 20 tablet 8  . Vitamin D, Ergocalciferol, (DRISDOL) 50000 units CAPS capsule TAKE 1 CAPSULE BY MOUTH ONCE EVERY WEEK AS DIRECTED 12 capsule 3   No current facility-administered medications on file prior to visit.     Past Medical History:  Diagnosis Date  . Allergy   . Anxiety   . 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 Asthma   . Depression   .  GERD (gastroesophageal reflux disease)    diet controlled, no med  . History of endoscopy   . HSV infection   . IgA deficiency ([place])   . Low vitamin B12 level   . Osteoporosis   . Ovarian cyst    TAH/BSO  . Thyroid dysfunction     Past Surgical History:  Procedure Laterality Date  . broken arm     surgery for this  . BROW LIFT  1/15  . COLONOSCOPY    . THYROIDECTOMY  2000  . TOTAL ABDOMINAL HYSTERECTOMY W/ BILATERAL SALPINGOOPHORECTOMY  1999    Social History   Socioeconomic History  . Marital status: Married     Spouse name: Not on file  . Number of children: Not on file  . Years of education: Not on file  . Highest education level: Not on file  Occupational History  . Not on file  Social Needs  . Financial resource strain: Not on file  . Food insecurity:    Worry: Not on file    Inability: Not on file  . Transportation needs:    Medical: Not on file    Non-medical: Not on file  Tobacco Use  . Smoking status: Former Smoker    Years: 3.00    Types: Cigarettes    Last attempt to quit: 08/24/1968    Years since quitting: 49.2  . Smokeless tobacco: Never Used  . Tobacco comment: in college  Substance and Sexual Activity  . Alcohol use: Yes    Alcohol/week: 2.4 oz    Types: 4 Glasses of wine per week    Comment: glass of wine  . Drug use: No  . Sexual activity: Yes    Partners: Female    Birth control/protection: Surgical    Comment: TAH/BSO  Lifestyle  . Physical activity:    Days per week: Not on file    Minutes per session: Not on file  . Stress: Not on file  Relationships  . Social connections:    Talks on phone: Not on file    Gets together: Not on file    Attends religious service: Not on file    Active member of club or organization: Not on file    Attends meetings of clubs or organizations: Not on file    Relationship status: Not on file  Other Topics Concern  . Not on file  Social History Narrative  . Not on file    Family History  Problem Relation Age of Onset  . COPD Father        chronic lung infections  . Colon polyps Father   . Heart attack Mother   . Osteoporosis Mother   . Breast cancer Mother 37  . Lung cancer Mother   . Colon polyps Mother   . Colon cancer Neg Hx   . Esophageal cancer Neg Hx   . Stomach cancer Neg Hx   . Rectal cancer Neg Hx     Review of Systems  Constitutional: Positive for chills and fatigue. Negative for fever.  HENT: Positive for congestion, ear pain (right ear - intermittent), postnasal drip and sinus pressure. Negative  for sore throat.   Respiratory: Positive for cough. Negative for shortness of breath and wheezing.   Neurological: Positive for headaches. Negative for dizziness and light-headedness.       Objective:   Vitals:   11/29/17 0925  BP: 118/68  Pulse: 71  Resp: 16  Temp: 98 F (36.7 C)  SpO2: 98%  Filed Weights   11/29/17 0925  Weight: 116 lb (52.6 kg)   Body mass index is 21.22 kg/m.  Wt Readings from Last 3 Encounters:  11/29/17 116 lb (52.6 kg)  11/05/17 118 lb (53.5 kg)  05/25/17 114 lb (51.7 kg)     Physical Exam GENERAL APPEARANCE: Appears stated age, well appearing, NAD EYES: conjunctiva clear, no icterus HEENT: bilateral tympanic membranes and ear canals normal, oropharynx with no erythema, no thyromegaly, trachea midline, no cervical or supraclavicular lymphadenopathy LUNGS: Clear to auscultation without wheeze or crackles, unlabored breathing, good air entry bilaterally CARDIOVASCULAR: Normal S1,S2 without murmurs, no edema SKIN: warm, dry        Assessment & Plan:   See Problem List for Assessment and Plan of chronic medical problems.

## 2017-11-29 ENCOUNTER — Encounter: Payer: Self-pay | Admitting: Internal Medicine

## 2017-11-29 ENCOUNTER — Other Ambulatory Visit: Payer: Self-pay | Admitting: Internal Medicine

## 2017-11-29 ENCOUNTER — Ambulatory Visit: Payer: Medicare Other | Admitting: Internal Medicine

## 2017-11-29 VITALS — BP 118/68 | HR 71 | Temp 98.0°F | Resp 16 | Wt 116.0 lb

## 2017-11-29 DIAGNOSIS — J0100 Acute maxillary sinusitis, unspecified: Secondary | ICD-10-CM

## 2017-11-29 MED ORDER — PREDNISONE 10 MG PO TABS: ORAL_TABLET | ORAL | 0 refills | Status: DC

## 2017-11-29 MED ORDER — AMOXICILLIN-POT CLAVULANATE 875-125 MG PO TABS: 1.0000 | ORAL_TABLET | Freq: Two times a day (BID) | ORAL | 0 refills | Status: DC

## 2017-11-29 NOTE — Assessment & Plan Note (Signed)
Her current symptoms are likely a combination of allergies and a probable sinus infection.  Given her history of IgA deficiency she is prone to sinus infections and they are sometimes difficult to treat Start Augmentin twice daily times 10 days Prednisone taper Over-the-counter cold medications as needed Continue allergy medications Call if no improvement

## 2017-11-29 NOTE — Patient Instructions (Signed)
You were prescribed an antibiotic and steroid taper.     Sinusitis, Adult Sinusitis is soreness and inflammation of your sinuses. Sinuses are hollow spaces in the bones around your face. Your sinuses are located:  Around your eyes.  In the middle of your forehead.  Behind your nose.  In your cheekbones.  Your sinuses and nasal passages are lined with a stringy fluid (mucus). Mucus normally drains out of your sinuses. When your nasal tissues become inflamed or swollen, the mucus can become trapped or blocked so air cannot flow through your sinuses. This allows bacteria, viruses, and funguses to grow, which leads to infection. Sinusitis can develop quickly and last for 7?10 days (acute) or for more than 12 weeks (chronic). Sinusitis often develops after a cold. What are the causes? This condition is caused by anything that creates swelling in the sinuses or stops mucus from draining, including:  Allergies.  Asthma.  Bacterial or viral infection.  Abnormally shaped bones between the nasal passages.  Nasal growths that contain mucus (nasal polyps).  Narrow sinus openings.  Pollutants, such as chemicals or irritants in the air.  A foreign object stuck in the nose.  A fungal infection. This is rare.  What increases the risk? The following factors may make you more likely to develop this condition:  Having allergies or asthma.  Having had a recent cold or respiratory tract infection.  Having structural deformities or blockages in your nose or sinuses.  Having a weak immune system.  Doing a lot of swimming or diving.  Overusing nasal sprays.  Smoking.  What are the signs or symptoms? The main symptoms of this condition are pain and a feeling of pressure around the affected sinuses. Other symptoms include:  Upper toothache.  Earache.  Headache.  Bad breath.  Decreased sense of smell and taste.  A cough that may get worse at  night.  Fatigue.  Fever.  Thick drainage from your nose. The drainage is often green and it may contain pus (purulent).  Stuffy nose or congestion.  Postnasal drip. This is when extra mucus collects in the throat or back of the nose.  Swelling and warmth over the affected sinuses.  Sore throat.  Sensitivity to light.  How is this diagnosed? This condition is diagnosed based on symptoms, a medical history, and a physical exam. To find out if your condition is acute or chronic, your health care provider may:  Look in your nose for signs of nasal polyps.  Tap over the affected sinus to check for signs of infection.  View the inside of your sinuses using an imaging device that has a light attached (endoscope).  If your health care provider suspects that you have chronic sinusitis, you may also:  Be tested for allergies.  Have a sample of mucus taken from your nose (nasal culture) and checked for bacteria.  Have a mucus sample examined to see if your sinusitis is related to an allergy.  If your sinusitis does not respond to treatment and it lasts longer than 8 weeks, you may have an MRI or CT scan to check your sinuses. These scans also help to determine how severe your infection is. In rare cases, a bone biopsy may be done to rule out more serious types of fungal sinus disease. How is this treated? Treatment for sinusitis depends on the cause and whether your condition is chronic or acute. If a virus is causing your sinusitis, your symptoms will go away on their own  within 10 days. You may be given medicines to relieve your symptoms, including:  Topical nasal decongestants. They shrink swollen nasal passages and let mucus drain from your sinuses.  Antihistamines. These drugs block inflammation that is triggered by allergies. This can help to ease swelling in your nose and sinuses.  Topical nasal corticosteroids. These are nasal sprays that ease inflammation and swelling in  your nose and sinuses.  Nasal saline washes. These rinses can help to get rid of thick mucus in your nose.  If your condition is caused by bacteria, you will be given an antibiotic medicine. If your condition is caused by a fungus, you will be given an antifungal medicine. Surgery may be needed to correct underlying conditions, such as narrow nasal passages. Surgery may also be needed to remove polyps. Follow these instructions at home: Medicines  Take, use, or apply over-the-counter and prescription medicines only as told by your health care provider. These may include nasal sprays.  If you were prescribed an antibiotic medicine, take it as told by your health care provider. Do not stop taking the antibiotic even if you start to feel better. Hydrate and Humidify  Drink enough water to keep your urine clear or pale yellow. Staying hydrated will help to thin your mucus.  Use a cool mist humidifier to keep the humidity level in your home above 50%.  Inhale steam for 10-15 minutes, 3-4 times a day or as told by your health care provider. You can do this in the bathroom while a hot shower is running.  Limit your exposure to cool or dry air. Rest  Rest as much as possible.  Sleep with your head raised (elevated).  Make sure to get enough sleep each night. General instructions  Apply a warm, moist washcloth to your face 3-4 times a day or as told by your health care provider. This will help with discomfort.  Wash your hands often with soap and water to reduce your exposure to viruses and other germs. If soap and water are not available, use hand sanitizer.  Do not smoke. Avoid being around people who are smoking (secondhand smoke).  Keep all follow-up visits as told by your health care provider. This is important. Contact a health care provider if:  You have a fever.  Your symptoms get worse.  Your symptoms do not improve within 10 days. Get help right away if:  You have a  severe headache.  You have persistent vomiting.  You have pain or swelling around your face or eyes.  You have vision problems.  You develop confusion.  Your neck is stiff.  You have trouble breathing. This information is not intended to replace advice given to you by your health care provider. Make sure you discuss any questions you have with your health care provider. Document Released: 08/10/2005 Document Revised: 04/05/2016 Document Reviewed: 06/05/2015 Elsevier Interactive Patient Education  [employer].

## 2017-12-08 ENCOUNTER — Encounter: Payer: Self-pay | Admitting: Internal Medicine

## 2017-12-09 ENCOUNTER — Encounter: Payer: Self-pay | Admitting: Internal Medicine

## 2017-12-09 ENCOUNTER — Other Ambulatory Visit: Payer: Self-pay | Admitting: Internal Medicine

## 2017-12-09 NOTE — Telephone Encounter (Signed)
Copied from [place] [id number]. Topic: Quick Communication - Rx Refill/Question >> Dec 09, 2017  2:54 PM Percell Belt A wrote: Medication: amoxicillin-clavulanate (AUGMENTIN) 875-125 MG tablet [[id number]] Has the patient contacted their pharmacy?no  (Agent: If no, request that the patient contact the pharmacy for the refill.) Preferred Pharmacy (with phone number or street name): [facility] in [place], see attached my chart message  Agent: Please be advised that RX refills may take up to 3 business days. We ask that you follow-up with your pharmacy.

## 2017-12-10 ENCOUNTER — Encounter: Payer: Self-pay | Admitting: Internal Medicine

## 2017-12-12 MED ORDER — AMOXICILLIN-POT CLAVULANATE 875-125 MG PO TABS: 1.0000 | ORAL_TABLET | Freq: Two times a day (BID) | ORAL | 0 refills | Status: DC

## 2017-12-15 ENCOUNTER — Encounter: Payer: Self-pay | Admitting: Internal Medicine

## 2017-12-17 MED ORDER — PREDNISONE 10 MG PO TABS: ORAL_TABLET | ORAL | 0 refills | Status: DC

## 2017-12-21 ENCOUNTER — Other Ambulatory Visit: Payer: Self-pay | Admitting: Internal Medicine

## 2017-12-21 MED ORDER — AMOXICILLIN-POT CLAVULANATE 875-125 MG PO TABS: 1.0000 | ORAL_TABLET | Freq: Two times a day (BID) | ORAL | 0 refills | Status: DC

## 2017-12-21 NOTE — Addendum Note (Signed)
Addended by: Binnie Rail on: 12/21/2017 01:27 PM   Modules accepted: Orders

## 2018-01-20 NOTE — Progress Notes (Signed)
Subjective:    Patient ID: Jo Gomez, female    DOB: April 22, 1946, 72 y.o.   MRN: [id number]  HPI She is here for an acute visit for sinus symptoms.  She was here 4/8 for a sinus infection.  Since then she has had two rounds or antibiotics and two rounds of prednisone.    Her symptoms started one month, but she thinks they probably never went away from her previous sinus infections starting in April  She is experiencing fatigue, fever at home, mild nasal congestion, sinus pain and pressure and postnasal drip, cough, nausea and some body aches.  She denies any shortness of breath, wheezing, sore pain or ear pain.  She has taken nasal sprays  Medications and allergies reviewed with patient and updated if appropriate.  Patient Active Problem List   Diagnosis Date Noted  . Skin abnormalities 03/29/2017  . Cardiac murmur 09/29/2016  . Intractable migraine without aura and with status migrainosus 08/09/2016  . Pain of both shoulder joints 06/24/2016  . Biceps tendonitis 06/19/2016  . 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 Chronic frontal sinusitis 02/06/2016  . Acute non-recurrent maxillary sinusitis 01/22/2016  . Insomnia 09/16/2015  . Low back pain radiating to left leg 06/25/2014  . 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 HIATAL HERNIA WITH REFLUX 07/09/2010  . Vitamin B12 deficiency 03/05/2010  . Esophageal reflux 03/04/2010  . 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 Hypothyroidism 09/05/2007  . Allergic rhinitis 09/05/2007  . Depression 10/21/2006  . Osteopenia 10/21/2006    Current Outpatient Medications on File Prior to Visit  Medication Sig Dispense Refill  . acyclovir (ZOVIRAX) 400 MG tablet One tablet 4 times daily when necessary 50 tablet 3  . albuterol (PROVENTIL HFA;VENTOLIN HFA) 108 (90 Base) MCG/ACT inhaler INHALE 2 PUFFS INTO THE LUNGS EVERY 6 HOURS AS NEEDED 8.5 g 2  . cyanocobalamin (,VITAMIN B-12,) 1000  MCG/ML injection INJECT 1 ML UNDER THE SKIN EVERY 30 DAYS 30 mL 1  . estrogen-methylTESTOSTERone (EEMT HS) 0.625-1.25 MG tablet Take 1 tablet by mouth daily. 30 tablet 5  . fluticasone (FLONASE) 50 MCG/ACT nasal spray USE 2 SPRAYS IN EACH NOSTRIL QD AT NIGHT  5  . levothyroxine (SYNTHROID, LEVOTHROID) 75 MCG tablet TAKE 1 TABLET BY MOUTH DAILY BEFORE BREAKFAST 90 tablet 1  . predniSONE (DELTASONE) 10 MG tablet Take 4 tabs po qd x 3 days, then 3 tabs po qd x 3 days, then 2 tabs po qd x 3 days, then 1 tab po qd x 3 days 30 tablet 0  . sertraline (ZOLOFT) 100 MG tablet Take 1.5 tablets (150 mg total) by mouth daily. 135 tablet 3  . sodium chloride (OCEAN) 0.65 % SOLN nasal spray Place 1 spray into both nostrils as needed for congestion. 15 mL 0  . traZODone (DESYREL) 150 MG tablet TAKE 1 TABLET(150 MG) BY MOUTH AT BEDTIME 90 tablet 3  . TUBERCULIN SYR 1CC/25GX5/8" (SAFETY-LOK TB SYR 1CC/25GX5/8") 25G X 5/8" 1 ML MISC USE AS DIRECTED FOR B-12 INJECTIONS. 20 each 3  . valACYclovir (VALTREX) 1000 MG tablet Take 2000 mg Q 12 hrs x 1 day prn 20 tablet 8  . Vitamin D, Ergocalciferol, (DRISDOL) 50000 units CAPS capsule TAKE 1 CAPSULE BY MOUTH ONCE EVERY WEEK AS DIRECTED 12 capsule 3   No current facility-administered medications on file prior to visit.     Past Medical History:  Diagnosis Date  . Allergy   . Anxiety   .  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 Asthma   . Depression   . GERD (gastroesophageal reflux disease)    diet controlled, no med  . History of endoscopy   . HSV infection   . IgA deficiency ([place])   . Low vitamin B12 level   . Osteoporosis   . Ovarian cyst    TAH/BSO  . Thyroid dysfunction     Past Surgical History:  Procedure Laterality Date  . broken arm     surgery for this  . BROW LIFT  1/15  . COLONOSCOPY    . THYROIDECTOMY  2000  . TOTAL ABDOMINAL HYSTERECTOMY W/ BILATERAL SALPINGOOPHORECTOMY  1999    Social History   Socioeconomic History  .  Marital status: Married    Spouse name: Not on file  . Number of children: Not on file  . Years of education: Not on file  . Highest education level: Not on file  Occupational History  . Not on file  Social Needs  . Financial resource strain: Not on file  . Food insecurity:    Worry: Not on file    Inability: Not on file  . Transportation needs:    Medical: Not on file    Non-medical: Not on file  Tobacco Use  . Smoking status: Former Smoker    Years: 3.00    Types: Cigarettes    Last attempt to quit: 08/24/1968    Years since quitting: 49.4  . Smokeless tobacco: Never Used  . Tobacco comment: in college  Substance and Sexual Activity  . Alcohol use: Yes    Alcohol/week: 2.4 oz    Types: 4 Glasses of wine per week    Comment: glass of wine  . Drug use: No  . Sexual activity: Yes    Partners: Female    Birth control/protection: Surgical    Comment: TAH/BSO  Lifestyle  . Physical activity:    Days per week: Not on file    Minutes per session: Not on file  . Stress: Not on file  Relationships  . Social connections:    Talks on phone: Not on file    Gets together: Not on file    Attends religious service: Not on file    Active member of club or organization: Not on file    Attends meetings of clubs or organizations: Not on file    Relationship status: Not on file  Other Topics Concern  . Not on file  Social History Narrative  . Not on file    Family History  Problem Relation Age of Onset  . COPD Father        chronic lung infections  . Colon polyps Father   . Heart attack Mother   . Osteoporosis Mother   . Breast cancer Mother 63  . Lung cancer Mother   . Colon polyps Mother   . Colon cancer Neg Hx   . Esophageal cancer Neg Hx   . Stomach cancer Neg Hx   . Rectal cancer Neg Hx     Review of Systems  Constitutional: Positive for fatigue and fever (yestereday).  HENT: Positive for congestion (mild), postnasal drip, sinus pressure and sinus pain. Negative  for ear pain and sore throat.   Respiratory: Positive for cough (mildly productive). Negative for shortness of breath and wheezing.   Cardiovascular: Negative for chest pain.  Gastrointestinal: Positive for nausea.       Some gerd  Musculoskeletal: Positive for myalgias.  Neurological: Positive for headaches.       Objective:   Vitals:   01/21/18 1054  BP: 112/60  Pulse: 72  Resp: 16  Temp: 98.8 F (37.1 C)  SpO2: 98%   Filed Weights   01/21/18 1054  Weight: 111 lb (50.3 kg)   Body mass index is 20.3 kg/m.  Wt Readings from Last 3 Encounters:  01/21/18 111 lb (50.3 kg)  11/29/17 116 lb (52.6 kg)  11/05/17 118 lb (53.5 kg)     Physical Exam GENERAL APPEARANCE: Appears stated age, well appearing, NAD EYES: conjunctiva clear, no icterus HEENT: bilateral tympanic membranes and ear canals normal, oropharynx with no erythema, no thyromegaly, trachea midline, no cervical or supraclavicular lymphadenopathy LUNGS: Clear to auscultation without wheeze or crackles, unlabored breathing, good air entry bilaterally CARDIOVASCULAR: Normal S1,S2 without murmurs, no edema SKIN: warm, dry        Assessment & Plan:   See Problem List for Assessment and Plan of chronic medical problems.

## 2018-01-21 ENCOUNTER — Ambulatory Visit: Payer: Medicare Other | Admitting: Internal Medicine

## 2018-01-21 ENCOUNTER — Encounter: Payer: Self-pay | Admitting: Neurology

## 2018-01-21 ENCOUNTER — Encounter: Payer: Self-pay | Admitting: Internal Medicine

## 2018-01-21 DIAGNOSIS — J324 Chronic pansinusitis: Secondary | ICD-10-CM

## 2018-01-21 MED ORDER — AMOXICILLIN-POT CLAVULANATE 875-125 MG PO TABS: 1.0000 | ORAL_TABLET | Freq: Two times a day (BID) | ORAL | 0 refills | Status: DC

## 2018-01-21 MED ORDER — PREDNISONE 10 MG PO TABS: ORAL_TABLET | ORAL | 0 refills | Status: DC

## 2018-01-21 NOTE — Patient Instructions (Signed)
Take the antibiotic and prednisone as prescribed.   Continue your over the counter medications.    Call if no improvement

## 2018-01-21 NOTE — Assessment & Plan Note (Signed)
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

## 2018-01-26 ENCOUNTER — Other Ambulatory Visit: Payer: Self-pay | Admitting: Internal Medicine

## 2018-01-26 ENCOUNTER — Encounter: Payer: Self-pay | Admitting: Internal Medicine

## 2018-01-26 DIAGNOSIS — Z1231 Encounter for screening mammogram for malignant neoplasm of breast: Secondary | ICD-10-CM

## 2018-01-26 MED ORDER — CEFDINIR 300 MG PO CAPS: 300.0000 mg | ORAL_CAPSULE | Freq: Two times a day (BID) | ORAL | 0 refills | Status: DC

## 2018-02-08 ENCOUNTER — Ambulatory Visit
Admission: RE | Admit: 2018-02-08 | Discharge: 2018-02-08 | Disposition: A | Discharge status: Home / Self Care | Payer: Medicare Other | Source: Ambulatory Visit | Attending: Internal Medicine | Admitting: Internal Medicine

## 2018-02-08 ENCOUNTER — Encounter: Payer: Self-pay | Admitting: Internal Medicine

## 2018-02-08 DIAGNOSIS — Z1231 Encounter for screening mammogram for malignant neoplasm of breast: Secondary | ICD-10-CM

## 2018-02-08 NOTE — Progress Notes (Signed)
Subjective:    Patient ID: Jo Gomez, female    DOB: 06/27/1946, 72 y.o.   MRN: [id number]  HPI The patient is here for an acute visit.  She is taking the omnicef and felt better - she has 4 days left, but now feels fatigued, weak, nauseous, and has been having left frontal headaches.  She has brought up occasional yellow sputum.  She has had a fever, but her last fever was 2 days ago.  She has had occasional lightheadedness.  She has felt good for a couple of days and then will feel worse and feels sick again.  She has not noticed any significant nasal congestion, persistent ear pain, sinus pain or pressure, sore throat, shortness of breath or wheezing.  She has 4 days left of the Omnicef.  She is using her Flonase nightly and using her albuterol as needed.  She does have her history of chronic and resistant sinus infections because of her IgA deficiency and is concerned that the infection is not completely resolved.  GERD: She is also been experiencing some increased heartburn, which is not necessarily new.  She is taking an over-the-counter antacid twice daily.  Medications and allergies reviewed with patient and updated if appropriate.  Patient Active Problem List   Diagnosis Date Noted  . Skin abnormalities 03/29/2017  . Cardiac murmur 09/29/2016  . Intractable migraine without aura and with status migrainosus 08/09/2016  . Pain of both shoulder joints 06/24/2016  . Biceps tendonitis 06/19/2016  . 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 Chronic frontal sinusitis 02/06/2016  . Chronic sinusitis 01/22/2016  . Insomnia 09/16/2015  . Low back pain radiating to left leg 06/25/2014  . 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 HIATAL HERNIA WITH REFLUX 07/09/2010  . Vitamin B12 deficiency 03/05/2010  . Esophageal reflux 03/04/2010  . 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 Hypothyroidism 09/05/2007  . Allergic rhinitis 09/05/2007    . Depression 10/21/2006  . Osteopenia 10/21/2006    Current Outpatient Medications on File Prior to Visit  Medication Sig Dispense Refill  . acyclovir (ZOVIRAX) 400 MG tablet One tablet 4 times daily when necessary 50 tablet 3  . albuterol (PROVENTIL HFA;VENTOLIN HFA) 108 (90 Base) MCG/ACT inhaler INHALE 2 PUFFS INTO THE LUNGS EVERY 6 HOURS AS NEEDED 8.5 g 2  . cyanocobalamin (,VITAMIN B-12,) 1000 MCG/ML injection INJECT 1 ML UNDER THE SKIN EVERY 30 DAYS 30 mL 1  . estrogen-methylTESTOSTERone (EEMT HS) 0.625-1.25 MG tablet Take 1 tablet by mouth daily. 30 tablet 5  . fluticasone (FLONASE) 50 MCG/ACT nasal spray USE 2 SPRAYS IN EACH NOSTRIL QD AT NIGHT  5  . levothyroxine (SYNTHROID, LEVOTHROID) 75 MCG tablet TAKE 1 TABLET BY MOUTH DAILY BEFORE BREAKFAST 90 tablet 1  . sertraline (ZOLOFT) 100 MG tablet Take 1.5 tablets (150 mg total) by mouth daily. 135 tablet 3  . sodium chloride (OCEAN) 0.65 % SOLN nasal spray Place 1 spray into both nostrils as needed for congestion. 15 mL 0  . traZODone (DESYREL) 150 MG tablet TAKE 1 TABLET(150 MG) BY MOUTH AT BEDTIME 90 tablet 3  . TUBERCULIN SYR 1CC/25GX5/8" (SAFETY-LOK TB SYR 1CC/25GX5/8") 25G X 5/8" 1 ML MISC USE AS DIRECTED FOR B-12 INJECTIONS. 20 each 3  . valACYclovir (VALTREX) 1000 MG tablet Take 2000 mg Q 12 hrs x 1 day prn 20 tablet 8  . Vitamin D, Ergocalciferol, (DRISDOL) 50000 units CAPS capsule TAKE 1 CAPSULE BY MOUTH ONCE  EVERY WEEK AS DIRECTED 12 capsule 3   No current facility-administered medications on file prior to visit.     Past Medical History:  Diagnosis Date  . Allergy   . Anxiety   . 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 Asthma   . Depression   . GERD (gastroesophageal reflux disease)    diet controlled, no med  . History of endoscopy   . HSV infection   . IgA deficiency ([place])   . Low vitamin B12 level   . Osteoporosis   . Ovarian cyst    TAH/BSO  . Thyroid dysfunction     Past Surgical History:   Procedure Laterality Date  . broken arm     surgery for this  . BROW LIFT  1/15  . COLONOSCOPY    . THYROIDECTOMY  2000  . TOTAL ABDOMINAL HYSTERECTOMY W/ BILATERAL SALPINGOOPHORECTOMY  1999    Social History   Socioeconomic History  . Marital status: Married    Spouse name: Not on file  . Number of children: Not on file  . Years of education: Not on file  . Highest education level: Not on file  Occupational History  . Not on file  Social Needs  . Financial resource strain: Not on file  . Food insecurity:    Worry: Not on file    Inability: Not on file  . Transportation needs:    Medical: Not on file    Non-medical: Not on file  Tobacco Use  . Smoking status: Former Smoker    Years: 3.00    Types: Cigarettes    Last attempt to quit: 08/24/1968    Years since quitting: 49.4  . Smokeless tobacco: Never Used  . Tobacco comment: in college  Substance and Sexual Activity  . Alcohol use: Yes    Alcohol/week: 2.4 oz    Types: 4 Glasses of wine per week    Comment: glass of wine  . Drug use: No  . Sexual activity: Yes    Partners: Female    Birth control/protection: Surgical    Comment: TAH/BSO  Lifestyle  . Physical activity:    Days per week: Not on file    Minutes per session: Not on file  . Stress: Not on file  Relationships  . Social connections:    Talks on phone: Not on file    Gets together: Not on file    Attends religious service: Not on file    Active member of club or organization: Not on file    Attends meetings of clubs or organizations: Not on file    Relationship status: Not on file  Other Topics Concern  . Not on file  Social History Narrative  . Not on file    Family History  Problem Relation Age of Onset  . COPD Father        chronic lung infections  . Colon polyps Father   . Heart attack Mother   . Osteoporosis Mother   . Lung cancer Mother   . Colon polyps Mother   . Colon cancer Neg Hx   . Esophageal cancer Neg Hx   . Stomach  cancer Neg Hx   . Rectal cancer Neg Hx     Review of Systems  Constitutional: Positive for fatigue and fever (last 2 days ago - associated with chills and sweats).  HENT: Negative for congestion, ear pain (occ shooting pain in left ear), sinus pressure, sinus pain and sore throat.  Respiratory: Positive for cough (productive at times of yellow sputum - in morning). Negative for shortness of breath and wheezing.   Gastrointestinal: Positive for nausea.       GERD more frequently  Neurological: Positive for light-headedness (at times) and headaches (intermittent - frontal).       Objective:   Vitals:   02/09/18 1457  BP: 126/68  Pulse: 66  Resp: 16  Temp: 97.7 F (36.5 C)  SpO2: 98%   BP Readings from Last 3 Encounters:  02/09/18 126/68  01/21/18 112/60  11/29/17 118/68   Wt Readings from Last 3 Encounters:  02/09/18 111 lb (50.3 kg)  01/21/18 111 lb (50.3 kg)  11/29/17 116 lb (52.6 kg)   Body mass index is 20.3 kg/m.   Physical Exam    GENERAL APPEARANCE: Appears stated age, well appearing, NAD EYES: conjunctiva clear, no icterus HEENT: bilateral tympanic membranes and ear canals normal, oropharynx with no erythema, no thyromegaly, trachea midline, no cervical or supraclavicular lymphadenopathy LUNGS: Clear to auscultation without wheeze or crackles, unlabored breathing, good air entry bilaterally CARDIOVASCULAR: Normal S1,S2 without murmurs, no edema SKIN: Warm, dry      Assessment & Plan:    See Problem List for Assessment and Plan of chronic medical problems.

## 2018-02-09 ENCOUNTER — Encounter: Payer: Self-pay | Admitting: Internal Medicine

## 2018-02-09 ENCOUNTER — Ambulatory Visit: Payer: Medicare Other | Admitting: Internal Medicine

## 2018-02-09 VITALS — BP 126/68 | HR 66 | Temp 97.7°F | Resp 16 | Wt 111.0 lb

## 2018-02-09 DIAGNOSIS — J321 Chronic frontal sinusitis: Secondary | ICD-10-CM | POA: Diagnosis not present

## 2018-02-09 DIAGNOSIS — K219 Gastro-esophageal reflux disease without esophagitis: Secondary | ICD-10-CM

## 2018-02-09 NOTE — Patient Instructions (Signed)
Take nexium daily - 30 minutes prior to dinner for two weeks.   Finish the [employer].  Continue your current nasal sprays and albuterol as needed.    If you sinus symptoms return let me know and we can consider a referral back to ENT or a ct scan of your sinuses.

## 2018-02-09 NOTE — Assessment & Plan Note (Addendum)
Complete Omnicef We have already had at least 2 or 3 other courses of antibiotics in the past few months so no additional antibiotics at this time Not convinced that she has an active infection and needs antibiotics right now She will watch her symptoms over the next week after completing the [place] daily If she continues to remain symptomatic we will consider CT of the sinuses or ENT referral

## 2018-02-09 NOTE — Assessment & Plan Note (Signed)
She is taking an over-the-counter Pepcid or Zantac twice daily, but still having some GERD This may be causing some of her nausea and possibly some of her other symptoms Can also flareup sinus symptoms Taking over-the-counter PPI such as Nexium for 2 weeks to see how symptoms change

## 2018-02-15 ENCOUNTER — Telehealth: Payer: Self-pay | Admitting: Internal Medicine

## 2018-02-15 NOTE — Telephone Encounter (Signed)
rec'd from [place] forwarded 45 pages to Dr.Burns [facility]

## 2018-02-15 NOTE — Telephone Encounter (Signed)
Rec'd from [place] forwarded 1 CD to Dr.Burns Erline [facility]ine

## 2018-02-19 ENCOUNTER — Encounter: Payer: Self-pay | Admitting: Internal Medicine

## 2018-02-19 DIAGNOSIS — R059 Cough, unspecified: Secondary | ICD-10-CM

## 2018-02-19 DIAGNOSIS — R05 Cough: Secondary | ICD-10-CM

## 2018-02-21 ENCOUNTER — Ambulatory Visit (INDEPENDENT_AMBULATORY_CARE_PROVIDER_SITE_OTHER)
Admission: RE | Admit: 2018-02-21 | Discharge: 2018-02-21 | Disposition: A | Discharge status: Home / Self Care | Payer: Medicare Other | Source: Ambulatory Visit | Attending: Internal Medicine | Admitting: Internal Medicine

## 2018-02-21 DIAGNOSIS — R05 Cough: Secondary | ICD-10-CM | POA: Diagnosis not present

## 2018-02-21 DIAGNOSIS — R059 Cough, unspecified: Secondary | ICD-10-CM

## 2018-02-21 MED ORDER — RIZATRIPTAN BENZOATE 10 MG PO TABS: 10.0000 mg | ORAL_TABLET | ORAL | 5 refills | Status: DC | PRN

## 2018-04-05 NOTE — Progress Notes (Addendum)
NEUROLOGY CONSULTATION NOTE  Jo Gomez MRN: [id number] DOB: 1945-11-29  Referring provider: Erline Levine, MD Primary care provider: Billey Gosling, MD  Reason for consult:  microbleed on brain MRI, cognitive issues  HISTORY OF PRESENT ILLNESS: Jo Gomez is a 72 year old right-handed female with arthritis, asthma, depression, and history of thyroid dysfunction and B12 deficiency who presents for evaluation of cognitive problems and MRI findings.  In 2017, she was experiencing headache and sinus congestion.  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  It has been followed by neurosurgeon Dr. Erline Levine.  She had a repeat MRI in February 2018, which was stable.  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  No other apparent evidence of chronic small vessel disease noted.    She also reports problems with cognition.  She first noticed memory problems maybe 5 years ago and has progressively gotten worse.  She will often leave the house and forget your keys or sunglasses. She has trouble remembering names, both people she already knows and new people.  She does not get disoriented when driving on familiar routes.  She handles the finances.  She was missing some payments and has switched autopay.   She has no difficulty paying off the credit card bill.  She is able to independently take her medications.  She will repeat questions.  She has trouble hearing at times.  She enjoys reading novels and has no trouble following along.  She has long history of depression but is well-controlled on sertraline.  She is treated for hypothyroidism.  She takes monthly B12 shots.  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  She has a Brewing technologist.  She denies family history of dementia.  PAST MEDICAL HISTORY: Past Medical History:  Diagnosis Date  . Allergy   . Anxiety   . 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 Asthma   . Depression   . GERD (gastroesophageal reflux disease)    diet controlled, no med  . History of endoscopy   . HSV infection   . 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 Low vitamin B12 level   . Osteoporosis   . Ovarian cyst    TAH/BSO  . Thyroid dysfunction     PAST SURGICAL HISTORY: Past Surgical History:  Procedure Laterality Date  . broken arm     surgery for this  . BROW LIFT  1/15  . COLONOSCOPY    . THYROIDECTOMY  2000  . TOTAL ABDOMINAL HYSTERECTOMY W/ BILATERAL SALPINGOOPHORECTOMY  1999    MEDICATIONS: Current Outpatient Medications on File Prior to Visit  Medication Sig Dispense Refill  . acyclovir (ZOVIRAX) 400 MG tablet One tablet 4 times daily when necessary 50 tablet 3  . albuterol (PROVENTIL HFA;VENTOLIN HFA) 108 (90 Base) MCG/ACT inhaler INHALE 2 PUFFS INTO THE LUNGS EVERY 6 HOURS AS NEEDED 8.5 g 2  . cyanocobalamin (,VITAMIN B-12,) 1000 MCG/ML injection INJECT 1 ML UNDER THE SKIN EVERY 30 DAYS 30 mL 1  . estrogen-methylTESTOSTERone (EEMT HS) 0.625-1.25 MG tablet Take 1 tablet by mouth daily. 30 tablet 5  . fluticasone (FLONASE) 50 MCG/ACT nasal spray USE 2 SPRAYS IN EACH NOSTRIL QD AT NIGHT  5  . levothyroxine (SYNTHROID, LEVOTHROID) 75 MCG tablet TAKE 1 TABLET BY MOUTH DAILY BEFORE BREAKFAST 90 tablet 1  . rizatriptan (MAXALT) 10 MG tablet Take 1 tablet (10 mg total) by mouth as needed for migraine. May repeat in 2 hours if needed 10 tablet 5  . sertraline (ZOLOFT) 100 MG tablet Take 1.5 tablets (150 mg total) by mouth daily. 135 tablet 3  . sodium chloride (OCEAN) 0.65 % SOLN nasal spray Place 1 spray into both nostrils as needed for congestion. 15 mL 0  . traZODone (DESYREL) 150 MG tablet TAKE 1 TABLET(150 MG) BY MOUTH AT BEDTIME 90  tablet 3  . TUBERCULIN SYR 1CC/25GX5/8" (SAFETY-LOK TB SYR 1CC/25GX5/8") 25G X 5/8" 1 ML MISC USE AS DIRECTED FOR B-12 INJECTIONS. 20 each 3  . valACYclovir (VALTREX) 1000 MG tablet Take 2000 mg Q 12 hrs x 1 day prn 20 tablet 8  . Vitamin D, Ergocalciferol, (DRISDOL) 50000 units CAPS capsule TAKE 1 CAPSULE BY MOUTH ONCE EVERY WEEK AS DIRECTED 12 capsule 3   No current facility-administered medications on file prior to visit.     ALLERGIES: Allergies  Allergen Reactions  . Clarithromycin     Severe leg and muscle cramps  . Erythromycin Base Nausea And Vomiting  . Levaquin [Levofloxacin In D5w]     tendonitis  . Codeine Rash    Syrup only, can take hydrocodone, oxycodone  . Sulfonamide Derivatives Rash  . Tetracycline Rash    FAMILY HISTORY: Family History  Problem Relation Age of Onset  . COPD Father        chronic lung infections  . Colon polyps Father   . Heart attack Mother   . Osteoporosis Mother   . Lung cancer Mother   . Colon polyps Mother   . Colon cancer Neg Hx   . Esophageal cancer Neg Hx   . Stomach cancer Neg Hx   . Rectal cancer Neg Hx     SOCIAL HISTORY: Social History   Socioeconomic History  . Marital status: Married    Spouse name: Not on file  . Number of children: Not on file  . Years of education: Not on file  . Highest education level: Not on file  Occupational History  . Not on file  Social Needs  . Financial resource strain: Not on file  . Food insecurity:    Worry: Not on file    Inability: Not on file  . Transportation needs:    Medical: Not on file    Non-medical: Not on file  Tobacco Use  . Smoking status: Former Smoker    Years: 3.00    Types: Cigarettes    Last attempt to quit: 08/24/1968    Years since quitting: 49.6  . Smokeless tobacco: Never Used  . Tobacco comment: in college  Substance and Sexual Activity  . Alcohol use: Yes    Alcohol/week: 4.0 standard drinks    Types: 4 Glasses of wine per week    Comment: glass  of wine  . Drug use: No  . Sexual activity: Yes    Partners: Female    Birth control/protection: Surgical    Comment: TAH/BSO  Lifestyle  . Physical activity:    Days per week: Not on file    Minutes per session: Not on file  . Stress: Not on file  Relationships  . Social connections:    Talks on phone: Not on file    Gets together: Not on file    Attends  religious service: Not on file    Active member of club or organization: Not on file    Attends meetings of clubs or organizations: Not on file    Relationship status: Not on file  . Intimate partner violence:    Fear of current or ex partner: Not on file    Emotionally abused: Not on file    Physically abused: Not on file    Forced sexual activity: Not on file  Other Topics Concern  . Not on file  Social History Narrative  . Not on file    REVIEW OF SYSTEMS: Constitutional: No fevers, chills, or sweats, no generalized fatigue, change in appetite Eyes: No visual changes, double vision, eye pain Ear, nose and throat: No hearing loss, ear pain, nasal congestion, sore throat Cardiovascular: No chest pain, palpitations Respiratory:  No shortness of breath at rest or with exertion, wheezes GastrointestinaI: No nausea, vomiting, diarrhea, abdominal pain, fecal incontinence Genitourinary:  No dysuria, urinary retention or frequency Musculoskeletal:  No neck pain, back pain Integumentary: No rash, pruritus, skin lesions Neurological: as above Psychiatric: No depression, insomnia, anxiety Endocrine: No palpitations, fatigue, diaphoresis, mood swings, change in appetite, change in weight, increased thirst Hematologic/Lymphatic:  No purpura, petechiae. Allergic/Immunologic: no itchy/runny eyes, nasal congestion, recent allergic reactions, rashes  PHYSICAL EXAM: Blood pressure (!) 98/56, pulse 74, height 5' (1.524 m), weight 110 lb (49.9 kg), last menstrual period 08/24/1997. General: No acute distress.  Patient appears  well-groomed.  Head:  Normocephalic/atraumatic Eyes:  fundi examined but not visualized Neck: supple, no paraspinal tenderness, full range of motion Back: No paraspinal tenderness Heart: regular rate and rhythm Lungs: Clear to auscultation bilaterally. Vascular: No carotid bruits. Neurological Exam: Mental status: alert and oriented to person, place, and time, recent memory poor, remote memory intact, fund of knowledge intact, attention and concentration fair, speech fluent and not dysarthric, language intact. Montreal Cognitive Assessment  04/06/2018  Visuospatial/ Executive (0/5) 4  Naming (0/3) 3  Attention: Read list of digits (0/2) 2  Attention: Read list of letters (0/1) 1  Attention: Serial 7 subtraction starting at 100 (0/3) 1  Language: Repeat phrase (0/2) 2  Language : Fluency (0/1) 1  Abstraction (0/2) 2  Delayed Recall (0/5) 0  Orientation (0/6) 6  Total 22  Adjusted Score (based on education) 22   Cranial nerves: CN I: not tested CN II: pupils equal, round and reactive to light, visual fields intact CN III, IV, VI:  full range of motion, no nystagmus, no ptosis CN V: facial sensation intact CN VII: upper and lower face symmetric CN VIII: hearing intact CN IX, X: gag intact, uvula midline CN XI: sternocleidomastoid and trapezius muscles intact CN XII: tongue midline Bulk & Tone: normal, no fasciculations. Motor:  5/5 throughout  Sensation:  Pinprick and vibration sensation intact. Deep Tendon Reflexes:  2+ throughout, toes downgoing.  Finger to nose testing:  Without dysmetria.  Heel to shin:  Without dysmetria.  Gait:  Normal station and stride.  Able to turn and tandem walk. Romberg negative.  IMPRESSION: 1.  Short-term memory deficits.  Consider mild cognitive impairment 2.  Chronic small right thalamic hemorrhagic infarct, incidental finding, likely related to episode of elevated blood pressure.  PLAN: 1.  To further evaluate memory, we will order  neurocognitive testing with Dr. Si Raider 2.  Recommend Mediterranean diet, routine exercise, proper sleep hygiene, mental activities in which you learn new information, social interaction 3.  The incidental small bleed likely due to an episode  of elevated blood pressure.  It does not seem to be a chronic issue but no further testing recommended at this time. 4.  Follow up after testing  Thank you for allowing me to take part in the care of this patient.  Metta Clines, DO  CC: Billey Gosling, MD  Erline Levine, MD

## 2018-04-06 ENCOUNTER — Ambulatory Visit: Payer: Medicare Other | Admitting: Neurology

## 2018-04-06 ENCOUNTER — Encounter: Payer: Self-pay | Admitting: Neurology

## 2018-04-06 VITALS — BP 98/56 | HR 74 | Ht 60.0 in | Wt 110.0 lb

## 2018-04-06 DIAGNOSIS — I610 Nontraumatic intracerebral hemorrhage in hemisphere, subcortical: Secondary | ICD-10-CM

## 2018-04-06 DIAGNOSIS — R413 Other amnesia: Secondary | ICD-10-CM

## 2018-04-06 NOTE — Patient Instructions (Signed)
1.  To further evaluate memory, we will order neurocognitive testing with Dr. Si Raider 2.  Recommend Mediterranean diet, routine exercise, proper sleep hygiene, mental activities in which you learn new information, social interaction 3.  The incidental small bleed likely due to an episode of elevated blood pressure.  It does not seem to be a chronic issue but no further testing recommended at this time. 4.  Follow up after testing   Mediterranean Diet A Mediterranean diet refers to food and lifestyle choices that are based on the traditions of countries located on the [employer]. This way of eating has been shown to help prevent certain conditions and improve outcomes for people who have chronic diseases, like kidney disease and heart disease. What are tips for following this plan? Lifestyle  Cook and eat meals together with your family, when possible.  Drink enough fluid to keep your urine clear or pale yellow.  Be physically active every day. This includes: ? Aerobic exercise like running or swimming. ? Leisure activities like gardening, walking, or housework.  Get 7-8 hours of sleep each night.  If recommended by your health care provider, drink red wine in moderation. This means 1 glass a day for nonpregnant women and 2 glasses a day for men. A glass of wine equals 5 oz (150 mL). Reading food labels  Check the serving size of packaged foods. For foods such as rice and pasta, the serving size refers to the amount of cooked product, not dry.  Check the total fat in packaged foods. Avoid foods that have saturated fat or trans fats.  Check the ingredients list for added sugars, such as corn syrup. Shopping  At the grocery store, buy most of your food from the areas near the walls of the store. This includes: ? Fresh fruits and vegetables (produce). ? Grains, beans, nuts, and seeds. Some of these may be available in unpackaged forms or large amounts (in bulk). ? Fresh  seafood. ? Poultry and eggs. ? Low-fat dairy products.  Buy whole ingredients instead of prepackaged foods.  Buy fresh fruits and vegetables in-season from local farmers markets.  Buy frozen fruits and vegetables in resealable bags.  If you do not have access to quality fresh seafood, buy precooked frozen shrimp or canned fish, such as tuna, salmon, or sardines.  Buy small amounts of raw or cooked vegetables, salads, or olives from the deli or salad bar at your store.  Stock your pantry so you always have certain foods on hand, such as olive oil, canned tuna, canned tomatoes, rice, pasta, and beans. Cooking  Cook foods with extra-virgin olive oil instead of using butter or other vegetable oils.  Have meat as a side dish, and have vegetables or grains as your main dish. This means having meat in small portions or adding small amounts of meat to foods like pasta or stew.  Use beans or vegetables instead of meat in common dishes like chili or lasagna.  Experiment with different cooking methods. Try roasting or broiling vegetables instead of steaming or sauteing them.  Add frozen vegetables to soups, stews, pasta, or rice.  Add nuts or seeds for added healthy fat at each meal. You can add these to yogurt, salads, or vegetable dishes.  Marinate fish or vegetables using olive oil, lemon juice, garlic, and fresh herbs. Meal planning  Plan to eat 1 vegetarian meal one day each week. Try to work up to 2 vegetarian meals, if possible.  Eat seafood 2 or more  times a week.  Have healthy snacks readily available, such as: ? Vegetable sticks with hummus. ? [place] yogurt. ? Fruit and nut trail mix.  Eat balanced meals throughout the week. This includes: ? Fruit: 2-3 servings a day ? Vegetables: 4-5 servings a day ? Low-fat dairy: 2 servings a day ? Fish, poultry, or lean meat: 1 serving a day ? Beans and legumes: 2 or more servings a week ? Nuts and seeds: 1-2 servings a day ? Whole  grains: 6-8 servings a day ? Extra-virgin olive oil: 3-4 servings a day  Limit red meat and sweets to only a few servings a month What are my food choices?  Mediterranean diet ? Recommended ? Grains: Whole-grain pasta. Brown rice. Bulgar wheat. Polenta. Couscous. Whole-wheat bread. [place]. ? Vegetables: Artichokes. Beets. Broccoli. Cabbage. Carrots. Eggplant. Green beans. Chard. Kale. Spinach. Onions. Leeks. Peas. Squash. Tomatoes. Peppers. Radishes. ? Fruits: Apples. Apricots. Avocado. Berries. Bananas. Cherries. Dates. Figs. Grapes. Lemons. Melon. Oranges. Peaches. Plums. Pomegranate. ? Meats and other protein foods: Beans. Almonds. Sunflower seeds. Pine nuts. Peanuts. [place]. Salmon. Scallops. Shrimp. [place]. Tilapia. Clams. Oysters. Eggs. ? Dairy: Low-fat milk. Cheese. Greek yogurt. ? Beverages: Water. Red wine. Herbal tea. ? Fats and oils: Extra virgin olive oil. Avocado oil. Grape seed oil. ? Sweets and desserts: [place] yogurt with honey. Baked apples. Poached pears. Trail mix. ? Seasoning and other foods: Basil. Cilantro. Coriander. Cumin. Mint. Parsley. Sage. Rosemary. Tarragon. Garlic. Oregano. Thyme. Pepper. Balsalmic vinegar. Tahini. Hummus. Tomato sauce. Olives. Mushrooms. ? Limit these ? Grains: Prepackaged pasta or rice dishes. Prepackaged cereal with added sugar. ? Vegetables: Deep fried potatoes (french fries). ? Fruits: Fruit canned in syrup. ? Meats and other protein foods: Beef. Pork. Lamb. Poultry with skin. Hot dogs. [place] Salines. ? Dairy: Ice cream. Sour cream. Whole milk. ? Beverages: Juice. Sugar-sweetened soft drinks. Beer. Liquor and spirits. ? Fats and oils: Butter. Canola oil. Vegetable oil. Beef fat (tallow). Lard. ? Sweets and desserts: Cookies. Cakes. Pies. Candy. ? Seasoning and other foods: Mayonnaise. Premade sauces and marinades. ? The items listed may not be a complete list. Talk with your dietitian about what dietary choices are right for  you. Summary  The Mediterranean diet includes both food and lifestyle choices.  Eat a variety of fresh fruits and vegetables, beans, nuts, seeds, and whole grains.  Limit the amount of red meat and sweets that you eat.  Talk with your health care provider about whether it is safe for you to drink red wine in moderation. This means 1 glass a day for nonpregnant women and 2 glasses a day for men. A glass of wine equals 5 oz (150 mL). This information is not intended to replace advice given to you by your health care provider. Make sure you discuss any questions you have with your health care provider. Document Released: 04/02/2016 Document Revised: 05/05/2016 Document Reviewed: 04/02/2016 Elsevier Interactive Patient Education  [employer].

## 2018-04-12 ENCOUNTER — Encounter: Payer: Self-pay | Admitting: Physician Assistant

## 2018-04-12 ENCOUNTER — Ambulatory Visit: Payer: Medicare Other | Admitting: Physician Assistant

## 2018-04-12 VITALS — BP 118/68 | HR 82 | Ht 60.0 in | Wt 110.0 lb

## 2018-04-12 DIAGNOSIS — K219 Gastro-esophageal reflux disease without esophagitis: Secondary | ICD-10-CM | POA: Diagnosis not present

## 2018-04-12 MED ORDER — ESOMEPRAZOLE MAGNESIUM 40 MG PO CPDR: DELAYED_RELEASE_CAPSULE | ORAL | 11 refills | Status: DC

## 2018-04-12 NOTE — Progress Notes (Signed)
Subjective:    Patient ID: Jo Gomez, female    DOB: Apr 25, 1946, 72 y.o.   MRN: [id number]  HPI Jo Gomez is a pleasant 72 year old white female known to Dr. Fuller Plan with history of GERD, GA immunodeficiency, hypothyroidism, history of migraines, memory deficit, and chronic sinusitis. She was last seen here in November 2017 when she had colonoscopy which was a normal exam.  She was recommended for 5-year interval follow-up due to family history and parent with numerous colon polyps. She has history of GERD and had undergone work-up by Dr. Sharlett Iles in 2011 with EGD which was normal, manometry which was normal, and pH study which was consistent with chronic GERD, there was increased reflux in the recumbent position, no significant proximal acid. She had been on acid blockers in the past but said she had not been on anything in the past several years.  She had used over-the-counter medications Tums etc. as needed.  About 4 months ago after she had a sinus infection and took antibiotics she said she had significant increase in symptoms with heartburn and indigestion, associated nausea and weight loss of about 8 pounds.  She had chest discomfort without dysphagia or odynophagia but did have a lot of belching and hiccuping. She started using her wife's Nexium 40 mg once or twice daily which eventually seemed to significantly improve her symptoms. She says over the past 1-1/2 weeks her appetite is been much better, she is eating has no chest discomfort, no significant heartburn or indigestion and no nausea.  She is been taking Nexium 40 mg most days.  She is not on any regular aspirin or NSAIDs.  She does not drink alcohol on a regular basis and is a non-smoker.  She has been trying to follow an antireflux regimen.  Review of Systems Pertinent positive and negative review of systems were noted in the above HPI section.  All other review of systems was otherwise negative.  Outpatient Encounter Medications  as of 04/12/2018  Medication Sig  . acyclovir (ZOVIRAX) 400 MG tablet One tablet 4 times daily when necessary  . albuterol (PROVENTIL HFA;VENTOLIN HFA) 108 (90 Base) MCG/ACT inhaler INHALE 2 PUFFS INTO THE LUNGS EVERY 6 HOURS AS NEEDED  . cyanocobalamin (,VITAMIN B-12,) 1000 MCG/ML injection INJECT 1 ML UNDER THE SKIN EVERY 30 DAYS  . esomeprazole (NEXIUM) 40 MG capsule Take 1 tablet  By mouth every morning.  . estrogen-methylTESTOSTERone (EEMT HS) 0.625-1.25 MG tablet Take 1 tablet by mouth daily.  . fluticasone (FLONASE) 50 MCG/ACT nasal spray as needed.   Marland Kitchen levothyroxine (SYNTHROID, LEVOTHROID) 75 MCG tablet TAKE 1 TABLET BY MOUTH DAILY BEFORE BREAKFAST  . rizatriptan (MAXALT) 10 MG tablet Take 1 tablet (10 mg total) by mouth as needed for migraine. May repeat in 2 hours if needed  . sertraline (ZOLOFT) 100 MG tablet Take 1.5 tablets (150 mg total) by mouth daily.  . sodium chloride (OCEAN) 0.65 % SOLN nasal spray Place 1 spray into both nostrils as needed for congestion.  . traZODone (DESYREL) 150 MG tablet TAKE 1 TABLET(150 MG) BY MOUTH AT BEDTIME  . TUBERCULIN SYR 1CC/25GX5/8" (SAFETY-LOK TB SYR 1CC/25GX5/8") 25G X 5/8" 1 ML MISC USE AS DIRECTED FOR B-12 INJECTIONS.  Marland Kitchen valACYclovir (VALTREX) 1000 MG tablet Take 2000 mg Q 12 hrs x 1 day prn  . Vitamin D, Ergocalciferol, (DRISDOL) 50000 units CAPS capsule TAKE 1 CAPSULE BY MOUTH ONCE EVERY WEEK AS DIRECTED  . [DISCONTINUED] esomeprazole (NEXIUM) 40 MG capsule Take 40 mg by  mouth daily as needed.   No facility-administered encounter medications on file as of 04/12/2018.    Allergies  Allergen Reactions  . Clarithromycin     Severe leg and muscle cramps  . Erythromycin Base Nausea And Vomiting  . Levaquin [Levofloxacin In D5w]     tendonitis  . Codeine Rash    Syrup only, can take hydrocodone, oxycodone  . Sulfonamide Derivatives Rash  . Tetracycline Rash   Patient Active Problem List   Diagnosis Date Noted  . Skin abnormalities  03/29/2017  . Cardiac murmur 09/29/2016  . Intractable migraine without aura and with status migrainosus 08/09/2016  . Pain of both shoulder joints 06/24/2016  . Biceps tendonitis 06/19/2016  . 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 Chronic frontal sinusitis 02/06/2016  . Chronic sinusitis 01/22/2016  . Insomnia 09/16/2015  . Low back pain radiating to left leg 06/25/2014  . 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 HIATAL HERNIA WITH REFLUX 07/09/2010  . Vitamin B12 deficiency 03/05/2010  . Esophageal reflux 03/04/2010  . 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 Hypothyroidism 09/05/2007  . Allergic rhinitis 09/05/2007  . Depression 10/21/2006  . Osteopenia 10/21/2006   Social History   Socioeconomic History  . Marital status: Married    Spouse name: [name]  . Number of children: 1  . Years of education: Not on file  . Highest education level: Master's degree (e.g., MA, MS, MEng, MEd, MSW, MBA)  Occupational History    Employer: RETIRED  Social Needs  . Financial resource strain: Not on file  . Food insecurity:    Worry: Not on file    Inability: Not on file  . Transportation needs:    Medical: Not on file    Non-medical: Not on file  Tobacco Use  . Smoking status: Former Smoker    Years: 3.00    Types: Cigarettes    Last attempt to quit: 08/24/1968    Years since quitting: 49.6  . Smokeless tobacco: Never Used  . Tobacco comment: in college  Substance and Sexual Activity  . Alcohol use: Yes    Comment: social  . Drug use: No  . Sexual activity: Yes    Partners: Female    Birth control/protection: Surgical    Comment: TAH/BSO  Lifestyle  . Physical activity:    Days per week: Not on file    Minutes per session: Not on file  . Stress: Not on file  Relationships  . Social connections:    Talks on phone: Not on file    Gets together: Not on file    Attends religious service: Not on file    Active member of club or  organization: Not on file    Attends meetings of clubs or organizations: Not on file    Relationship status: Not on file  . Intimate partner violence:    Fear of current or ex partner: Not on file    Emotionally abused: Not on file    Physically abused: Not on file    Forced sexual activity: Not on file  Other Topics Concern  . Not on file  Social History Narrative   Patient is right-handed. She lives with her wife in a split level house. She occasionally drinks coffee.    Ms. Reder family history includes COPD in her father; Colon polyps in her father and mother; Heart attack in her mother; Lung cancer in her mother; Osteoporosis in her mother.  Objective:    Vitals:   04/12/18 1040  BP: 118/68  Pulse: 82    Physical Exam; developed older white female in no acute distress, pleasant blood pressure 118/68 pulse 82, height 5 foot, weight 110, BMI 21.4.  HEENT; nontraumatic normocephalic EOMI PERRLA sclera anicteric buccal mucosa moist, Cardiovascular ;regular rate and rhythm with S1-S2 no murmur rub or gallop, Pulmonary; clear bilaterally, Abdomen; soft, nontender nondistended bowel sounds are active there is no palpable mass or hepatosplenomegaly, Rectal; exam not done, Ext; no clubbing cyanosis or edema skin warm and dry, Neuro psych ;alert and oriented, grossly nonfocal mood and affect appropriate       Assessment & Plan:   #28 72 year old white female with recent exacerbation of GERD for which she had not been on any chronic therapy over the past few years.  This was associated with a respiratory illness, bronchitis and antibiotic use. Symptoms are for the most part resolved at present on Nexium 40 mg daily.  #2 colon cancer surveillance-up-to-date with negative colonoscopy November 2017 due for follow-up 2023 #3.  IgA immunodeficiency  #4 hypothyroidism   #5 migraine headaches #6.  Memory deficits  Plan; we reviewed a antireflux diet and antireflux regimen  including elevation of the head of the bed and n.p.o. for 2 to 3 hours prior to bedtime.  She was given Scientist, clinical (histocompatibility and immunogenetics). We discussed options for management of GERD with H2 blockers versus PPI therapy. She will continue Nexium 40 mg p.o. every morning AC breakfast.  Have sent a prescription with 1 year refills. Patient will follow-up with Dr. Fuller Plan or myself on an as-needed basis.  She knows to call back if symptoms recur or worsen and would consider repeat EGD at that time.  Amy S Esterwood PA-C 04/12/2018   Cc: Binnie Rail, MD

## 2018-04-12 NOTE — Progress Notes (Signed)
Reviewed and agree with management plan.  Jyquan Kenley T. Aivy Akter, MD FACG 

## 2018-04-12 NOTE — Patient Instructions (Addendum)
We sent a prescription for refills for Nexium 40 mg. Garibaldi. We have provided you with antireflux information.  Follow up with Nicoletta Ba PA or Dr. Fuller Plan as needed.   If you are age 70 or older, your body mass index should be between 23-30. Your Body mass index is 21.48 kg/m. If this is out of the aforementioned range listed, please consider follow up with your Primary Care Provider.  Marland Kitchen

## 2018-06-05 ENCOUNTER — Other Ambulatory Visit: Payer: Self-pay | Admitting: Internal Medicine

## 2018-06-28 ENCOUNTER — Telehealth: Payer: Self-pay | Admitting: Neurology

## 2018-06-28 NOTE — Telephone Encounter (Signed)
Dr Bailar is leaving for a new job closer to home we have mailed a letter to the patient to inform them that we canceled all appointments with Dr Bailar. We thank you for the understanding °

## 2018-07-25 ENCOUNTER — Other Ambulatory Visit: Payer: Self-pay

## 2018-07-25 ENCOUNTER — Ambulatory Visit: Payer: Medicare Other | Admitting: Obstetrics & Gynecology

## 2018-07-25 ENCOUNTER — Encounter: Payer: Self-pay | Admitting: Obstetrics & Gynecology

## 2018-07-25 VITALS — BP 156/84 | HR 84 | Resp 14 | Ht 59.5 in | Wt 113.6 lb

## 2018-07-25 DIAGNOSIS — Z01419 Encounter for gynecological examination (general) (routine) without abnormal findings: Secondary | ICD-10-CM | POA: Diagnosis not present

## 2018-07-25 MED ORDER — EST ESTROGENS-METHYLTEST 0.625-1.25 MG PO TABS: 1.0000 | ORAL_TABLET | Freq: Every day | ORAL | 5 refills | Status: DC

## 2018-07-25 NOTE — Progress Notes (Signed)
72 y.o. G1P1 Married White or Caucasian female here for annual exam.  Had some sinus issues.  Has also seen by Dr. Tomi Likens.  Had MRI at [facility].  Had hemosiderin on MRI.  Follow-up MRI planned in May.  She is having some mild memory issues as well.    Denies vaginal bleeding.  Did not get a flu shot.    PCP:  Dr. Quay Burow.  Had blood work done in March.  Patient's last menstrual period was 08/24/1997.          Sexually active: Yes.    The current method of family planning is status post hysterectomy.    Exercising: Yes.     Smoker:  no  Health Maintenance: Pap:  2004  History of abnormal Pap:  no MMG:  02/08/18 BIRADS1:neg  Colonoscopy:  07/15/16 normal. F/u 5 years  BMD:   11/19/16 osteopenia  TDaP:  2015 Pneumonia vaccine(s):  2015 Shingrix:   No Hep C testing: 09/18/15 neg  Screening Labs: PCP   reports that she quit smoking about 49 years ago. Her smoking use included cigarettes. She quit after 3.00 years of use. She has never used smokeless tobacco. She reports that she drinks about 2.0 standard drinks of alcohol per week. She reports that she does not use drugs.  Past Medical History:  Diagnosis Date  . Allergy   . Anxiety   . 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 Asthma   . Depression   . GERD (gastroesophageal reflux disease)    diet controlled, no med  . History of endoscopy   . HSV infection   . IgA deficiency ([place])   . Low vitamin B12 level   . Osteoporosis   . Ovarian cyst    TAH/BSO  . Thyroid dysfunction     Past Surgical History:  Procedure Laterality Date  . broken arm     surgery for this  . BROW LIFT  1/15  . COLONOSCOPY    . THYROIDECTOMY  2000  . TOTAL ABDOMINAL HYSTERECTOMY W/ BILATERAL SALPINGOOPHORECTOMY  1999    Current Outpatient Medications  Medication Sig Dispense Refill  . calcium-vitamin D (OSCAL WITH D) 500-200 MG-UNIT TABS tablet Take by mouth daily.    . cyanocobalamin (,VITAMIN B-12,) 1000 MCG/ML injection  INJECT 1 ML UNDER THE SKIN EVERY 30 DAYS 30 mL 1  . estrogen-methylTESTOSTERone (EEMT HS) 0.625-1.25 MG tablet Take 1 tablet by mouth daily. 30 tablet 5  . levothyroxine (SYNTHROID, LEVOTHROID) 75 MCG tablet TAKE 1 TABLET BY MOUTH DAILY BEFORE BREAKFAST 90 tablet 1  . rizatriptan (MAXALT) 10 MG tablet Take 1 tablet (10 mg total) by mouth as needed for migraine. May repeat in 2 hours if needed 10 tablet 5  . sertraline (ZOLOFT) 100 MG tablet Take 1.5 tablets (150 mg total) by mouth daily. 135 tablet 3  . sodium chloride (OCEAN) 0.65 % SOLN nasal spray Place 1 spray into both nostrils as needed for congestion. 15 mL 0  . traZODone (DESYREL) 150 MG tablet TAKE 1 TABLET(150 MG) BY MOUTH AT BEDTIME 90 tablet 3  . TUBERCULIN SYR 1CC/25GX5/8" (SAFETY-LOK TB SYR 1CC/25GX5/8") 25G X 5/8" 1 ML MISC USE AS DIRECTED FOR B-12 INJECTIONS. 20 each 3  . valACYclovir (VALTREX) 1000 MG tablet Take 2000 mg Q 12 hrs x 1 day prn 20 tablet 8  . Vitamin D, Ergocalciferol, (DRISDOL) 50000 units CAPS capsule TAKE 1 CAPSULE BY MOUTH ONCE EVERY WEEK AS DIRECTED 12 capsule 3  .  albuterol (PROVENTIL HFA;VENTOLIN HFA) 108 (90 Base) MCG/ACT inhaler INHALE 2 PUFFS INTO THE LUNGS EVERY 6 HOURS AS NEEDED (Patient not taking: Reported on 07/25/2018) 8.5 g 2  . fluticasone (FLONASE) 50 MCG/ACT nasal spray as needed.   5   No current facility-administered medications for this visit.     Family History  Problem Relation Age of Onset  . COPD Father        chronic lung infections  . Colon polyps Father   . Heart attack Mother   . Osteoporosis Mother   . Lung cancer Mother   . Colon polyps Mother   . Colon cancer Neg Hx   . Esophageal cancer Neg Hx   . Stomach cancer Neg Hx   . Rectal cancer Neg Hx     Review of Systems  All other systems reviewed and are negative.   Exam:   BP (!) 142/66 (BP Location: Right Arm, Patient Position: Sitting, Cuff Size: Large)   Pulse 84   Resp 14   Ht 4' 11.5" (1.511 m)   Wt 113 lb 9.6  oz (51.5 kg)   LMP 08/24/1997   BMI 22.56 kg/m     Height: 4' 11.5" (151.1 cm)  Ht Readings from Last 3 Encounters:  07/25/18 4' 11.5" (1.511 m)  04/12/18 5' (1.524 m)  04/06/18 5' (1.524 m)    General appearance: alert, cooperative and appears stated age Head: Normocephalic, without obvious abnormality, atraumatic Neck: no adenopathy, supple, symmetrical, trachea midline and thyroid normal to inspection and palpation Lungs: clear to auscultation bilaterally Breasts: normal appearance, no masses or tenderness Heart: regular rate and rhythm Abdomen: soft, non-tender; bowel sounds normal; no masses,  no organomegaly Extremities: extremities normal, atraumatic, no cyanosis or edema Skin: Skin color, texture, turgor normal. No rashes or lesions Lymph nodes: Cervical, supraclavicular, and axillary nodes normal. No abnormal inguinal nodes palpated Neurologic: Grossly normal   Pelvic: External genitalia:  no lesions              Urethra:  normal appearing urethra with no masses, tenderness or lesions              Bartholins and Skenes: normal                 Vagina: normal appearing vagina with normal color and discharge, no lesions              Cervix: absent              Pap taken: No. Bimanual Exam:  Uterus:  uterus absent              Adnexa: normal adnexa and no mass, fullness, tenderness               Rectovaginal: Confirms               Anus:  normal sphincter tone, no lesions  Chaperone was present for exam.  A:  Well Woman with normal exam H/O TAH/BSO Osteopenia, on holiday from Fosamax H/o possible small cerebral hemorrhage, followed by Dr. Tomi Likens now.  Having MRI again in May.  P:   Mammogram guidelines reviewed.  Doing yearly MMG. pap smear not indicated. Desires to continue her estratest HS, takes 1/2 tab 3 times weekly.  Rx for pharmacy for #30/5RF.  Risks reviewed.   Lab work will be done with Dr. Quay Burow in March BMD due in late 10/2018.  Will schedule next year for  year. return annually or prn

## 2018-07-29 ENCOUNTER — Telehealth: Payer: Self-pay | Admitting: Neurology

## 2018-07-29 NOTE — Telephone Encounter (Signed)
Left a message for patient regarding letter mailed (Dr Si Raider leaving). Stated that we would be happy to help with a referral to another neuropsychologist. Asked to please call us with questions or if wanting to proceed with referral.

## 2018-08-30 ENCOUNTER — Other Ambulatory Visit: Payer: Self-pay | Admitting: Internal Medicine

## 2018-08-30 DIAGNOSIS — F329 Major depressive disorder, single episode, unspecified: Secondary | ICD-10-CM

## 2018-08-30 DIAGNOSIS — F32A Depression, unspecified: Secondary | ICD-10-CM

## 2018-09-30 ENCOUNTER — Other Ambulatory Visit: Payer: Self-pay | Admitting: Obstetrics & Gynecology

## 2018-09-30 NOTE — Telephone Encounter (Signed)
Medication refill request: Vitamin D Last AEX:  07/25/18 SM Next AEX: none scheduled yet Last MMG (if hormonal medication request): 02/08/18 BIRADS 1negative/density b Refill authorized: 10/25/17 #12 w/3 refills; today please advise; order pended for #12 w/0 refills

## 2018-10-17 ENCOUNTER — Encounter: Payer: Medicare Other | Admitting: Psychology

## 2018-11-01 ENCOUNTER — Encounter: Payer: Medicare Other | Admitting: Psychology

## 2018-11-07 ENCOUNTER — Ambulatory Visit: Payer: Medicare Other | Admitting: Neurology

## 2018-11-13 ENCOUNTER — Other Ambulatory Visit: Payer: Self-pay | Admitting: Internal Medicine

## 2018-11-17 ENCOUNTER — Encounter: Payer: Self-pay | Admitting: *Deleted

## 2018-11-18 ENCOUNTER — Other Ambulatory Visit: Payer: Self-pay | Admitting: Internal Medicine

## 2018-12-06 ENCOUNTER — Other Ambulatory Visit: Payer: Self-pay | Admitting: Internal Medicine

## 2018-12-12 ENCOUNTER — Encounter: Payer: Medicare Other | Admitting: Internal Medicine

## 2018-12-29 ENCOUNTER — Encounter: Payer: Self-pay | Admitting: Internal Medicine

## 2019-01-02 ENCOUNTER — Encounter: Payer: Self-pay | Admitting: Internal Medicine

## 2019-01-02 ENCOUNTER — Ambulatory Visit (INDEPENDENT_AMBULATORY_CARE_PROVIDER_SITE_OTHER): Payer: Medicare Other | Admitting: Internal Medicine

## 2019-01-02 DIAGNOSIS — R053 Chronic cough: Secondary | ICD-10-CM | POA: Insufficient documentation

## 2019-01-02 DIAGNOSIS — R05 Cough: Secondary | ICD-10-CM

## 2019-01-02 MED ORDER — UMECLIDINIUM-VILANTEROL 62.5-25 MCG/INH IN AEPB: 1.0000 | INHALATION_SPRAY | Freq: Every day | RESPIRATORY_TRACT | 0 refills | Status: DC

## 2019-01-02 NOTE — Assessment & Plan Note (Addendum)
Experiencing a chronic cough-sometimes productive of sputum that may be clear, white or gray-greenish No fever, cold symptoms, no sinus infection symptoms, wheeze or shortness of breath She does not feel that she has an infection Agree-no need for an antibiotic Concerned about previous chest x-ray that said chronic bronchitic-reactive airway changes  Will do a trial of a daily inhaler-Anoro sent to pharmacy Advised to rinse mouth out with water after use We will refer to pulmonary for further evaluation

## 2019-01-02 NOTE — Progress Notes (Signed)
Virtual Visit via Video Note  I connected with Jo Gomez on 01/02/19 at 10:30 AM EDT by a video enabled telemedicine application and verified that I am speaking with the correct person using two identifiers.   I discussed the limitations of evaluation and management by telemedicine and the availability of in person appointments. The patient expressed understanding and agreed to proceed.  The patient is currently at home and I am in the office.    No referring provider.    History of Present Illness: This is an acute visit for cough.  She is always had a chronic cough, but her cough has gotten worse the past couple of months.  It is constant all day and all night.  It is slightly productive - the sputum is clear, white or gray - greenish in color.  She does not feel that she has an infection.  Her last chest x-ray, July 2019, stated that she had chronic bronchitic-reactive airway changes, stable.  Lungs were otherwise clear.  She was concerned that she had a chronic bronchitis and was worried if this will he did get worse or if there is some of the reason for her.  She has had testing elsewhere in the past that showed reduced lung capacity.  She is also been told she had decreased cilia function and wonders if that is contributing.  She is not having any sinus symptoms or other cold symptoms.  She has not tried albuterol and is only used that for acute URIs.  She has never tried a daily inhaler.   Review of Systems  Constitutional: Negative for chills and fever.  HENT: Negative for congestion, ear pain, sinus pain and sore throat.        No PND  Respiratory: Positive for cough. Negative for shortness of breath and wheezing.   Neurological: Negative for headaches.      Social History   Socioeconomic History  . Marital status: Married    Spouse name: Jo Gomez  . Number of children: 1  . Years of education: Not on file  . Highest education level: Master's degree (e.g., MA,  MS, MEng, MEd, MSW, MBA)  Occupational History    Employer: RETIRED  Social Needs  . Financial resource strain: Not on file  . Food insecurity:    Worry: Not on file    Inability: Not on file  . Transportation needs:    Medical: Not on file    Non-medical: Not on file  Tobacco Use  . Smoking status: Former Smoker    Years: 3.00    Types: Cigarettes    Last attempt to quit: 08/24/1968    Years since quitting: 50.3  . Smokeless tobacco: Never Used  . Tobacco comment: in college  Substance and Sexual Activity  . Alcohol use: Yes    Alcohol/week: 2.0 standard drinks    Types: 2 Standard drinks or equivalent per week  . Drug use: No  . Sexual activity: Yes    Partners: Female    Birth control/protection: Surgical    Comment: TAH/BSO  Lifestyle  . Physical activity:    Days per week: Not on file    Minutes per session: Not on file  . Stress: Not on file  Relationships  . Social connections:    Talks on phone: Not on file    Gets together: Not on file    Attends religious service: Not on file    Active member of club or organization: Not on file  Attends meetings of clubs or organizations: Not on file    Relationship status: Not on file  Other Topics Concern  . Not on file  Social History Narrative   Patient is right-handed. She lives with her wife in a split level house. She occasionally drinks coffee.     Observations/Objective: Appears well in NAD Breathing normally, no cough during her visit  DG Chest 2 View CLINICAL DATA:  Nonproductive cough and chest soreness for the past month. Little relief with antibiotics. History of asthma, former smoker.  EXAM: CHEST - 2 VIEW  COMPARISON:  PA and lateral chest x-ray of October 29, 2015  FINDINGS: The lungs are well-expanded and clear. The heart and pulmonary vascularity are normal. The mediastinum is normal in width. The trachea is midline. There is no pleural effusion. The bony thorax exhibits no acute  abnormality.  IMPRESSION: Chronic bronchitic-reactive airway changes, stable. No pneumonia nor other acute cardiopulmonary abnormality.  Electronically Signed   By: David  Martinique M.D.   On: 02/21/2018 17:02   Assessment and Plan:  See Problem List for Assessment and Plan of chronic medical problems.   Follow Up Instructions:    I discussed the assessment and treatment plan with the patient. The patient was provided an opportunity to ask questions and all were answered. The patient agreed with the plan and demonstrated an understanding of the instructions.   The patient was advised to call back or seek an in-person evaluation if the symptoms worsen or if the condition fails to improve as anticipated.    Binnie Rail, MD

## 2019-01-19 ENCOUNTER — Encounter: Payer: Self-pay | Admitting: Internal Medicine

## 2019-01-23 ENCOUNTER — Other Ambulatory Visit: Payer: Self-pay | Admitting: Pulmonary Disease

## 2019-01-23 ENCOUNTER — Ambulatory Visit: Payer: Medicare Other | Admitting: Pulmonary Disease

## 2019-01-23 ENCOUNTER — Encounter: Payer: Self-pay | Admitting: Pulmonary Disease

## 2019-01-23 ENCOUNTER — Other Ambulatory Visit: Payer: Self-pay

## 2019-01-23 DIAGNOSIS — R053 Chronic cough: Secondary | ICD-10-CM

## 2019-01-23 DIAGNOSIS — R05 Cough: Secondary | ICD-10-CM | POA: Diagnosis not present

## 2019-01-23 MED ORDER — ALBUTEROL SULFATE HFA 108 (90 BASE) MCG/ACT IN AERS: 2.0000 | INHALATION_SPRAY | Freq: Four times a day (QID) | RESPIRATORY_TRACT | 2 refills | Status: DC | PRN

## 2019-01-23 NOTE — Progress Notes (Signed)
Subjective:    Patient ID: Jo Gomez, female    DOB: 1946/01/03, 73 y.o.   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 She reports chronic cough for at least 10 to 15 years occasionally productive of green-yellow sputum.  She also has chronic sinus drainage and uses a sinus rinse.  She occasionally has bad attacks of bronchitis requiring prolonged courses of antibiotics, last such was a year and a half ago.  She developed cough productive of green sputum and some chest pain about 3 weeks ago, had a video visit with her PCP on 5/11 and was given Anoro.  She had albuterol a long time ago but did not use it this time.  Anoro really helped her symptoms and she wonders if she needs to use this every day. She denies wheezing, reports occasional dyspnea while walking up an incline She denies chest pain, paroxysmal nocturnal dyspnea or orthopnea.  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  He smoked for less than 4 pack years before quitting in [place] tests/ events reviewed  Chest x-ray 02/2018 showed prominent airway/vascular markings history of chronic bronchitis Spirometry 2011 showed ratio of 80, FEV1 105% FVC 104%   Past Medical History:  Diagnosis Date  . Allergy   . Anxiety   . 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 Asthma   . Depression   . GERD (gastroesophageal reflux disease)    diet controlled, no med  . History of endoscopy   . HSV infection   . IgA deficiency ([place])   . Low vitamin B12 level   . Osteoporosis   . Ovarian cyst    TAH/BSO  . Thyroid dysfunction    Past Surgical History:  Procedure Laterality Date   . broken arm     surgery for this  . BROW LIFT  1/15  . COLONOSCOPY    . THYROIDECTOMY  2000  . TOTAL ABDOMINAL HYSTERECTOMY W/ BILATERAL SALPINGOOPHORECTOMY  1999    Allergies  Allergen Reactions  . Clarithromycin     Severe leg and muscle cramps  . Erythromycin Base Nausea And Vomiting  . Levaquin [Levofloxacin In D5w]     tendonitis  . Codeine Rash    Syrup only, can take hydrocodone, oxycodone  . Other Other (See Comments) and Rash    IGA Deficiency - catches a lot of viruses  . Sulfonamide Derivatives Rash  . Tetracycline Rash    Social History   Socioeconomic History  . Marital status: Married    Spouse name: [name]  . Number of children: 1  . Years of education: Not on file  . Highest education level: Master's degree (e.g., MA, MS, MEng, MEd, MSW, MBA)  Occupational History    Employer: RETIRED  Social Needs  . Financial resource strain: Not on file  . Food insecurity:    Worry: Not on file    Inability: Not on file  . Transportation needs:    Medical: Not on file    Non-medical: Not on file  Tobacco Use  . Smoking status: Former Smoker    Packs/day: 0.50    Years: 3.00  Pack years: 1.50    Types: Cigarettes    Last attempt to quit: 08/24/1968    Years since quitting: 50.4  . Smokeless tobacco: Never Used  . Tobacco comment: in college  Substance and Sexual Activity  . Alcohol use: Yes    Alcohol/week: 2.0 standard drinks    Types: 2 Standard drinks or equivalent per week  . Drug use: No  . Sexual activity: Yes    Partners: Female    Birth control/protection: Surgical    Comment: TAH/BSO  Lifestyle  . Physical activity:    Days per week: Not on file    Minutes per session: Not on file  . Stress: Not on file  Relationships  . Social connections:    Talks on phone: Not on file    Gets together: Not on file    Attends religious service: Not on file    Active member of club or organization: Not on file    Attends meetings of clubs or  organizations: Not on file    Relationship status: Not on file  . Intimate partner violence:    Fear of current or ex partner: Not on file    Emotionally abused: Not on file    Physically abused: Not on file    Forced sexual activity: Not on file  Other Topics Concern  . Not on file  Social History Narrative   Patient is right-handed. She lives with her wife in a split level house. She occasionally drinks coffee.       Family History  Problem Relation Age of Onset  . COPD Father        chronic lung infections  . Colon polyps Father   . Heart attack Mother   . Osteoporosis Mother   . Lung cancer Mother   . Colon polyps Mother   . Colon cancer Neg Hx   . Esophageal cancer Neg Hx   . Stomach cancer Neg Hx   . Rectal cancer Neg Hx      Review of Systems Constitutional: negative for anorexia, fevers and sweats  Eyes: negative for irritation, redness and visual disturbance  Ears, nose, mouth, throat, and face: negative for earaches, epistaxis, nasal congestion and sore throat  Respiratory: negative for  wheezing  Cardiovascular: negative for chest pain, dyspnea, lower extremity edema, orthopnea, palpitations and syncope  Gastrointestinal: negative for abdominal pain, constipation, diarrhea, melena, nausea and vomiting  Genitourinary:negative for dysuria, frequency and hematuria  Hematologic/lymphatic: negative for bleeding, easy bruising and lymphadenopathy  Musculoskeletal:negative for arthralgias, muscle weakness and stiff joints  Neurological: negative for coordination problems, gait problems, headaches and weakness  Endocrine: negative for diabetic symptoms including polydipsia, polyuria and weight loss     Objective:   Physical Exam  Gen. Pleasant, thin woman, in no distress, normal affect ENT - no pallor,icterus, no post nasal drip Neck: No JVD, no thyromegaly, no carotid bruits Lungs: no use of accessory muscles, no dullness to percussion, clear without rales or  rhonchi  Cardiovascular: Rhythm regular, heart sounds  normal, no murmurs or gallops, no peripheral edema Abdomen: soft and non-tender, no hepatosplenomegaly, BS normal. Musculoskeletal: No deformities, no cyanosis or clubbing Neuro:  alert, non focal       Assessment & Plan:

## 2019-01-23 NOTE — Patient Instructions (Signed)
You have chronic bronchitis related to IgA deficiency  Primary issue may be secretion clearance Continue to use sinus rinses as you are doing Okay to use Mucinex 600 mg twice daily as needed when you have increased secretions  Okay to stop Anoro since you are better Prescription for albuterol MDI 2 puffs every 12 hours as needed for wheezing or increased secretions Antibiotic will be necessary only if secretions are persistently yellow/green  Pulmonary function testing in the future

## 2019-01-23 NOTE — Assessment & Plan Note (Addendum)
Chronic bronchitis likely related to IgA deficiency, no evidence of bronchiectasis  Primary issue may be secretion clearance Continue to use sinus rinses as you are doing Okay to use Mucinex 600 mg twice daily as needed when you have increased secretions  Okay to stop Anoro since you are better Prescription for albuterol MDI 2 puffs every 12 hours as needed for wheezing or increased secretions Antibiotic will be necessary only if secretions are persistently yellow/green  Pulmonary function testing in the future

## 2019-01-29 ENCOUNTER — Encounter: Payer: Self-pay | Admitting: Internal Medicine

## 2019-01-29 DIAGNOSIS — R739 Hyperglycemia, unspecified: Secondary | ICD-10-CM

## 2019-01-29 DIAGNOSIS — E890 Postprocedural hypothyroidism: Secondary | ICD-10-CM

## 2019-01-29 DIAGNOSIS — E538 Deficiency of other specified B group vitamins: Secondary | ICD-10-CM

## 2019-01-29 DIAGNOSIS — M8580 Other specified disorders of bone density and structure, unspecified site: Secondary | ICD-10-CM

## 2019-01-30 DIAGNOSIS — R739 Hyperglycemia, unspecified: Secondary | ICD-10-CM | POA: Insufficient documentation

## 2019-01-31 ENCOUNTER — Other Ambulatory Visit: Payer: Self-pay | Admitting: Internal Medicine

## 2019-01-31 DIAGNOSIS — G43909 Migraine, unspecified, not intractable, without status migrainosus: Secondary | ICD-10-CM | POA: Insufficient documentation

## 2019-01-31 NOTE — Progress Notes (Signed)
Subjective:    Patient ID: Jo Gomez, female    DOB: 27-Oct-1945, 73 y.o.   MRN: [id number]  HPI She is here for a physical exam.    Joint pain, back pain:  She takes tylenol three times a day.  She has had injections in her back and shoulders.  She is pain on a daily basis and is able to deal with it.  She has not had injections in a while.  She has no specific concerns or questions.  Overall she feels she is doing well.   Medications and allergies reviewed with patient and updated if appropriate.  Patient Active Problem List   Diagnosis Date Noted  . Migraine headache 01/31/2019  . Hyperglycemia 01/30/2019  . Chronic cough 01/02/2019  . Skin abnormalities 03/29/2017  . Cardiac murmur 09/29/2016  . Pain of both shoulder joints 06/24/2016  . Biceps tendonitis 06/19/2016  . 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 Chronic frontal sinusitis 02/06/2016  . Chronic sinusitis 01/22/2016  . Insomnia 09/16/2015  . Low back pain radiating to left leg 06/25/2014  . 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 HIATAL HERNIA WITH REFLUX 07/09/2010  . Vitamin B12 deficiency 03/05/2010  . Esophageal reflux 03/04/2010  . 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 Hypothyroidism 09/05/2007  . Allergic rhinitis 09/05/2007  . Depression 10/21/2006  . Osteopenia 10/21/2006    Current Outpatient Medications on File Prior to Visit  Medication Sig Dispense Refill  . albuterol (VENTOLIN HFA) 108 (90 Base) MCG/ACT inhaler Inhale 2 puffs into the lungs every 6 (six) hours as needed for wheezing or shortness of breath. 1 Inhaler 2  . ANORO ELLIPTA 62.5-25 MCG/INH AEPB INHALE 1 PUFF INTO THE LUNGS DAILY 60 each 0  . calcium-vitamin D (OSCAL WITH D) 500-200 MG-UNIT TABS tablet Take by mouth daily.    . cyanocobalamin (,VITAMIN B-12,) 1000 MCG/ML injection INJECT 1 ML UNDER THE SKIN EVERY 30 DAYS 30 mL 1  . estrogen-methylTESTOSTERone (EEMT HS)  0.625-1.25 MG tablet Take 1 tablet by mouth daily. 30 tablet 5  . fluticasone (FLONASE) 50 MCG/ACT nasal spray as needed.   5  . levothyroxine (SYNTHROID, LEVOTHROID) 75 MCG tablet TAKE 1 TABLET BY MOUTH DAILY BEFORE BREAKFAST 90 tablet 0  . rizatriptan (MAXALT) 10 MG tablet Take 1 tablet (10 mg total) by mouth as needed for migraine. May repeat in 2 hours if needed 10 tablet 5  . sertraline (ZOLOFT) 100 MG tablet Take 1.5 tablets (150 mg total) by mouth daily. -- Office visit needed for further refills 135 tablet 0  . sodium chloride (OCEAN) 0.65 % SOLN nasal spray Place 1 spray into both nostrils as needed for congestion. 15 mL 0  . traZODone (DESYREL) 150 MG tablet TAKE 1 TABLET(150 MG) BY MOUTH AT BEDTIME 90 tablet 3  . valACYclovir (VALTREX) 1000 MG tablet TAKE 2 TABLETS BY MOUTH EVERY 12 HOURS FOR 1 DAY AS NEEDED 20 tablet 1  . Vitamin D, Ergocalciferol, (DRISDOL) 1.25 MG (50000 UT) CAPS capsule TAKE 1 CAPSULE BY MOUTH ONCE EVERY WEEK AS DIRECTED 12 capsule 0  . TUBERCULIN SYR 1CC/25GX5/8" (SAFETY-LOK TB SYR 1CC/25GX5/8") 25G X 5/8" 1 ML MISC USE AS DIRECTED FOR B-12 INJECTIONS. 20 each 3   No current facility-administered medications on file prior to visit.     Past Medical History:  Diagnosis Date  . Allergy   . Anxiety   . 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  Asthma   . Depression   . GERD (gastroesophageal reflux disease)    diet controlled, no med  . History of endoscopy   . HSV infection   . IgA deficiency ([place])   . Low vitamin B12 level   . Osteoporosis   . Ovarian cyst    TAH/BSO  . Thyroid dysfunction     Past Surgical History:  Procedure Laterality Date  . broken arm     surgery for this  . BROW LIFT  1/15  . COLONOSCOPY    . THYROIDECTOMY  2000  . TOTAL ABDOMINAL HYSTERECTOMY W/ BILATERAL SALPINGOOPHORECTOMY  1999    Social History   Socioeconomic History  . Marital status: Married    Spouse name: [name]  . Number of children: 1  . Years  of education: Not on file  . Highest education level: Master's degree (e.g., MA, MS, MEng, MEd, MSW, MBA)  Occupational History    Employer: RETIRED  Social Needs  . Financial resource strain: Not on file  . Food insecurity:    Worry: Not on file    Inability: Not on file  . Transportation needs:    Medical: Not on file    Non-medical: Not on file  Tobacco Use  . Smoking status: Former Smoker    Packs/day: 0.50    Years: 3.00    Pack years: 1.50    Types: Cigarettes    Last attempt to quit: 08/24/1968    Years since quitting: 50.4  . Smokeless tobacco: Never Used  . Tobacco comment: in college  Substance and Sexual Activity  . Alcohol use: Yes    Alcohol/week: 2.0 standard drinks    Types: 2 Standard drinks or equivalent per week  . Drug use: No  . Sexual activity: Yes    Partners: Female    Birth control/protection: Surgical    Comment: TAH/BSO  Lifestyle  . Physical activity:    Days per week: Not on file    Minutes per session: Not on file  . Stress: Not on file  Relationships  . Social connections:    Talks on phone: Not on file    Gets together: Not on file    Attends religious service: Not on file    Active member of club or organization: Not on file    Attends meetings of clubs or organizations: Not on file    Relationship status: Not on file  Other Topics Concern  . Not on file  Social History Narrative   Patient is right-handed. She lives with her wife in a split level house. She occasionally drinks coffee.    Family History  Problem Relation Age of Onset  . COPD Father        chronic lung infections  . Colon polyps Father   . Heart attack Mother   . Osteoporosis Mother   . Lung cancer Mother   . Colon polyps Mother   . Colon cancer Neg Hx   . Esophageal cancer Neg Hx   . Stomach cancer Neg Hx   . Rectal cancer Neg Hx     Review of Systems  Constitutional: Negative for chills and fever.  Eyes: Negative for visual disturbance.  Respiratory:  Positive for cough (chronic, occ brings up sputum - clear). Negative for shortness of breath and wheezing.   Cardiovascular: Negative for chest pain, palpitations and leg swelling.  Gastrointestinal: Negative for abdominal pain, blood in stool, constipation, diarrhea and nausea.       Rare  gerd  Genitourinary: Negative for dysuria and hematuria.  Musculoskeletal: Positive for arthralgias and back pain.  Neurological: Positive for headaches (occ migraines). Negative for dizziness and light-headedness.  Psychiatric/Behavioral: Positive for dysphoric mood and sleep disturbance. The patient is not nervous/anxious.        Objective:   Vitals:   02/01/19 1059  BP: 134/64  Pulse: 67  Resp: 16  Temp: 98.6 F (37 C)  SpO2: 97%   Filed Weights   02/01/19 1059  Weight: 124 lb (56.2 kg)   Body mass index is 24.22 kg/m.  BP Readings from Last 3 Encounters:  02/01/19 134/64  01/23/19 118/72  07/25/18 (!) 156/84    Wt Readings from Last 3 Encounters:  02/01/19 124 lb (56.2 kg)  01/23/19 117 lb 12.8 oz (53.4 kg)  07/25/18 113 lb 9.6 oz (51.5 kg)     Physical Exam Constitutional: She appears well-developed and well-nourished. No distress.  HENT:  Head: Normocephalic and atraumatic.  Right Ear: External ear normal. Normal ear canal and TM Left Ear: External ear normal.  Normal ear canal and TM Mouth/Throat: Oropharynx is clear and moist.  Eyes: Conjunctivae and EOM are normal.  Neck: Neck supple. No tracheal deviation present. No thyromegaly present.  No carotid bruit  Cardiovascular: Normal rate, regular rhythm and normal heart sounds. No murmur heard.  No edema. Pulmonary/Chest: Effort normal and breath sounds normal. No respiratory distress. She has no wheezes. She has no rales.  Breast: deferred   Abdominal: Soft. She exhibits no distension. There is no tenderness.  Lymphadenopathy: She has no cervical adenopathy.  Skin: Skin is warm and dry. She is not diaphoretic.   Psychiatric: She has a normal mood and affect. Her behavior is normal.        Assessment & Plan:   Physical exam: Screening blood work ordered Immunizations  Pneumovax today, discussed shingrix, others up to date Colonoscopy   Up to date  Mammogram   Up to date  Gyn  Up to date  Dexa    Up to date  Eye exams    Up to date  Exercise    hiking Weight  Normal BMI Skin   No concerns Substance abuse   none  See Problem List for Assessment and Plan of chronic medical problems.   Follow-up in 1 year, sooner if needed

## 2019-01-31 NOTE — Patient Instructions (Addendum)
Your blood work was ordered.  All other Health Maintenance issues reviewed.   All recommended immunizations and age-appropriate screenings are up-to-date or discussed.  Pneumovax pneumonia vaccine administered today.   Medications reviewed and updated.  Changes include :   Try diclofenac gel for your painful joints.   Your prescription(s) have been submitted to your pharmacy. Please take as directed and contact our office if you believe you are having problem(s) with the medication(s).   Please followup in one year    Health Maintenance, Female Adopting a healthy lifestyle and getting preventive care can go a long way to promote health and wellness. Talk with your health care provider about what schedule of regular examinations is right for you. This is a good chance for you to check in with your provider about disease prevention and staying healthy. In between checkups, there are plenty of things you can do on your own. Experts have done a lot of research about which lifestyle changes and preventive measures are most likely to keep you healthy. Ask your health care provider for more information. Weight and diet Eat a healthy diet  Be sure to include plenty of vegetables, fruits, low-fat dairy products, and lean protein.  Do not eat a lot of foods high in solid fats, added sugars, or salt.  Get regular exercise. This is one of the most important things you can do for your health. ? Most adults should exercise for at least 150 minutes each week. The exercise should increase your heart rate and make you sweat (moderate-intensity exercise). ? Most adults should also do strengthening exercises at least twice a week. This is in addition to the moderate-intensity exercise. Maintain a healthy weight  Body mass index (BMI) is a measurement that can be used to identify possible weight problems. It estimates body fat based on height and weight. Your health care provider can help determine your  BMI and help you achieve or maintain a healthy weight.  For females 23 years of age and older: ? A BMI below 18.5 is considered underweight. ? A BMI of 18.5 to 24.9 is normal. ? A BMI of 25 to 29.9 is considered overweight. ? A BMI of 30 and above is considered obese. Watch levels of cholesterol and blood lipids  You should start having your blood tested for lipids and cholesterol at 71 years of age, then have this test every 5 years.  You may need to have your cholesterol levels checked more often if: ? Your lipid or cholesterol levels are high. ? You are older than 71 years of age. ? You are at high risk for heart disease. Cancer screening Lung Cancer  Lung cancer screening is recommended for adults 63-58 years old who are at high risk for lung cancer because of a history of smoking.  A yearly low-dose CT scan of the lungs is recommended for people who: ? Currently smoke. ? Have quit within the past 15 years. ? Have at least a 30-pack-year history of smoking. A pack year is smoking an average of one pack of cigarettes a day for 1 year.  Yearly screening should continue until it has been 15 years since you quit.  Yearly screening should stop if you develop a health problem that would prevent you from having lung cancer treatment. Breast Cancer  Practice breast self-awareness. This means understanding how your breasts normally appear and feel.  It also means doing regular breast self-exams. Let your health care provider know about any changes,  no matter how small.  If you are in your 20s or 30s, you should have a clinical breast exam (CBE) by a health care provider every 1-3 years as part of a regular health exam.  If you are 65 or older, have a CBE every year. Also consider having a breast X-ray (mammogram) every year.  If you have a family history of breast cancer, talk to your health care provider about genetic screening.  If you are at high risk for breast cancer, talk to  your health care provider about having an MRI and a mammogram every year.  Breast cancer gene (BRCA) assessment is recommended for women who have family members with BRCA-related cancers. BRCA-related cancers include: ? Breast. ? Ovarian. ? Tubal. ? Peritoneal cancers.  Results of the assessment will determine the need for genetic counseling and BRCA1 and BRCA2 testing. Cervical Cancer Your health care provider may recommend that you be screened regularly for cancer of the pelvic organs (ovaries, uterus, and vagina). This screening involves a pelvic examination, including checking for microscopic changes to the surface of your cervix (Pap test). You may be encouraged to have this screening done every 3 years, beginning at age 95.  For women ages 58-65, health care providers may recommend pelvic exams and Pap testing every 3 years, or they may recommend the Pap and pelvic exam, combined with testing for human papilloma virus (HPV), every 5 years. Some types of HPV increase your risk of cervical cancer. Testing for HPV may also be done on women of any age with unclear Pap test results.  Other health care providers may not recommend any screening for nonpregnant women who are considered low risk for pelvic cancer and who do not have symptoms. Ask your health care provider if a screening pelvic exam is right for you.  If you have had past treatment for cervical cancer or a condition that could lead to cancer, you need Pap tests and screening for cancer for at least 20 years after your treatment. If Pap tests have been discontinued, your risk factors (such as having a new sexual partner) need to be reassessed to determine if screening should resume. Some women have medical problems that increase the chance of getting cervical cancer. In these cases, your health care provider may recommend more frequent screening and Pap tests. Colorectal Cancer  This type of cancer can be detected and often  prevented.  Routine colorectal cancer screening usually begins at 71 years of age and continues through 70 years of age.  Your health care provider may recommend screening at an earlier age if you have risk factors for colon cancer.  Your health care provider may also recommend using home test kits to check for hidden blood in the stool.  A small camera at the end of a tube can be used to examine your colon directly (sigmoidoscopy or colonoscopy). This is done to check for the earliest forms of colorectal cancer.  Routine screening usually begins at age 61.  Direct examination of the colon should be repeated every 5-10 years through 71 years of age. However, you may need to be screened more often if early forms of precancerous polyps or small growths are found. Skin Cancer  Check your skin from head to toe regularly.  Tell your health care provider about any new moles or changes in moles, especially if there is a change in a mole's shape or color.  Also tell your health care provider if you have a mole  that is larger than the size of a pencil eraser.  Always use sunscreen. Apply sunscreen liberally and repeatedly throughout the day.  Protect yourself by wearing long sleeves, pants, a wide-brimmed hat, and sunglasses whenever you are outside. Heart disease, diabetes, and high blood pressure  High blood pressure causes heart disease and increases the risk of stroke. High blood pressure is more likely to develop in: ? People who have blood pressure in the high end of the normal range (130-139/85-89 mm Hg). ? People who are overweight or obese. ? People who are African American.  If you are 74-57 years of age, have your blood pressure checked every 3-5 years. If you are 77 years of age or older, have your blood pressure checked every year. You should have your blood pressure measured twice-once when you are at a hospital or clinic, and once when you are not at a hospital or clinic. Record  the average of the two measurements. To check your blood pressure when you are not at a hospital or clinic, you can use: ? An automated blood pressure machine at a pharmacy. ? A home blood pressure monitor.  If you are between 55 years and 55 years old, ask your health care provider if you should take aspirin to prevent strokes.  Have regular diabetes screenings. This involves taking a blood sample to check your fasting blood sugar level. ? If you are at a normal weight and have a low risk for diabetes, have this test once every three years after 71 years of age. ? If you are overweight and have a high risk for diabetes, consider being tested at a younger age or more often. Preventing infection Hepatitis B  If you have a higher risk for hepatitis B, you should be screened for this virus. You are considered at high risk for hepatitis B if: ? You were born in a country where hepatitis B is common. Ask your health care provider which countries are considered high risk. ? Your parents were born in a high-risk country, and you have not been immunized against hepatitis B (hepatitis B vaccine). ? You have HIV or AIDS. ? You use needles to inject street drugs. ? You live with someone who has hepatitis B. ? You have had sex with someone who has hepatitis B. ? You get hemodialysis treatment. ? You take certain medicines for conditions, including cancer, organ transplantation, and autoimmune conditions. Hepatitis C  Blood testing is recommended for: ? Everyone born from [birth year] through 1963. ? Anyone with known risk factors for hepatitis C. Sexually transmitted infections (STIs)  You should be screened for sexually transmitted infections (STIs) including gonorrhea and chlamydia if: ? You are sexually active and are younger than 71 years of age. ? You are older than 71 years of age and your health care provider tells you that you are at risk for this type of infection. ? Your sexual activity has  changed since you were last screened and you are at an increased risk for chlamydia or gonorrhea. Ask your health care provider if you are at risk.  If you do not have HIV, but are at risk, it may be recommended that you take a prescription medicine daily to prevent HIV infection. This is called pre-exposure prophylaxis (PrEP). You are considered at risk if: ? You are sexually active and do not regularly use condoms or know the HIV status of your partner(s). ? You take drugs by injection. ? You are sexually active with  a partner who has HIV. Talk with your health care provider about whether you are at high risk of being infected with HIV. If you choose to begin PrEP, you should first be tested for HIV. You should then be tested every 3 months for as long as you are taking PrEP. Pregnancy  If you are premenopausal and you may become pregnant, ask your health care provider about preconception counseling.  If you may become pregnant, take 400 to 800 micrograms (mcg) of folic acid every day.  If you want to prevent pregnancy, talk to your health care provider about birth control (contraception). Osteoporosis and menopause  Osteoporosis is a disease in which the bones lose minerals and strength with aging. This can result in serious bone fractures. Your risk for osteoporosis can be identified using a bone density scan.  If you are 29 years of age or older, or if you are at risk for osteoporosis and fractures, ask your health care provider if you should be screened.  Ask your health care provider whether you should take a calcium or vitamin D supplement to lower your risk for osteoporosis.  Menopause may have certain physical symptoms and risks.  Hormone replacement therapy may reduce some of these symptoms and risks. Talk to your health care provider about whether hormone replacement therapy is right for you. Follow these instructions at home:  Schedule regular health, dental, and eye  exams.  Stay current with your immunizations.  Do not use any tobacco products including cigarettes, chewing tobacco, or electronic cigarettes.  If you are pregnant, do not drink alcohol.  If you are breastfeeding, limit how much and how often you drink alcohol.  Limit alcohol intake to no more than 1 drink per day for nonpregnant women. One drink equals 12 ounces of beer, 5 ounces of wine, or 1 ounces of hard liquor.  Do not use street drugs.  Do not share needles.  Ask your health care provider for help if you need support or information about quitting drugs.  Tell your health care provider if you often feel depressed.  Tell your health care provider if you have ever been abused or do not feel safe at home. This information is not intended to replace advice given to you by your health care provider. Make sure you discuss any questions you have with your health care provider. Document Released: 02/23/2011 Document Revised: 01/16/2016 Document Reviewed: 05/14/2015 Elsevier Interactive Patient Education  2019 [employer].

## 2019-02-01 ENCOUNTER — Other Ambulatory Visit: Payer: Self-pay

## 2019-02-01 ENCOUNTER — Ambulatory Visit (INDEPENDENT_AMBULATORY_CARE_PROVIDER_SITE_OTHER): Payer: Medicare Other | Admitting: Internal Medicine

## 2019-02-01 ENCOUNTER — Encounter: Payer: Self-pay | Admitting: Internal Medicine

## 2019-02-01 VITALS — BP 134/64 | HR 67 | Temp 98.6°F | Resp 16 | Ht 60.0 in | Wt 124.0 lb

## 2019-02-01 DIAGNOSIS — R739 Hyperglycemia, unspecified: Secondary | ICD-10-CM

## 2019-02-01 DIAGNOSIS — Z23 Encounter for immunization: Secondary | ICD-10-CM | POA: Diagnosis not present

## 2019-02-01 DIAGNOSIS — Z0000 Encounter for general adult medical examination without abnormal findings: Secondary | ICD-10-CM

## 2019-02-01 DIAGNOSIS — M545 Low back pain, unspecified: Secondary | ICD-10-CM

## 2019-02-01 DIAGNOSIS — G43909 Migraine, unspecified, not intractable, without status migrainosus: Secondary | ICD-10-CM

## 2019-02-01 DIAGNOSIS — G4709 Other insomnia: Secondary | ICD-10-CM

## 2019-02-01 DIAGNOSIS — E890 Postprocedural hypothyroidism: Secondary | ICD-10-CM | POA: Diagnosis not present

## 2019-02-01 DIAGNOSIS — M79605 Pain in left leg: Secondary | ICD-10-CM

## 2019-02-01 DIAGNOSIS — J42 Unspecified chronic bronchitis: Secondary | ICD-10-CM | POA: Insufficient documentation

## 2019-02-01 DIAGNOSIS — F3289 Other specified depressive episodes: Secondary | ICD-10-CM

## 2019-02-01 DIAGNOSIS — M8580 Other specified disorders of bone density and structure, unspecified site: Secondary | ICD-10-CM | POA: Diagnosis not present

## 2019-02-01 DIAGNOSIS — F32A Depression, unspecified: Secondary | ICD-10-CM

## 2019-02-01 DIAGNOSIS — M25512 Pain in left shoulder: Secondary | ICD-10-CM

## 2019-02-01 DIAGNOSIS — K219 Gastro-esophageal reflux disease without esophagitis: Secondary | ICD-10-CM

## 2019-02-01 DIAGNOSIS — F329 Major depressive disorder, single episode, unspecified: Secondary | ICD-10-CM

## 2019-02-01 DIAGNOSIS — M25511 Pain in right shoulder: Secondary | ICD-10-CM

## 2019-02-01 MED ORDER — SERTRALINE HCL 100 MG PO TABS: 150.0000 mg | ORAL_TABLET | Freq: Every day | ORAL | 1 refills | Status: DC

## 2019-02-01 MED ORDER — CYANOCOBALAMIN 1000 MCG/ML IJ SOLN: INTRAMUSCULAR | 1 refills | Status: DC

## 2019-02-01 MED ORDER — TRAZODONE HCL 150 MG PO TABS: ORAL_TABLET | ORAL | 3 refills | Status: DC

## 2019-02-01 MED ORDER — DICLOFENAC SODIUM 1 % TD GEL: 4.0000 g | Freq: Four times a day (QID) | TRANSDERMAL | 5 refills | Status: DC

## 2019-02-01 NOTE — Assessment & Plan Note (Signed)
Controlled, stable Continue trazodone 150 mg nightly

## 2019-02-01 NOTE — Assessment & Plan Note (Signed)
DEXA up-to-date Active, exercising Taking calcium and vitamin D daily

## 2019-02-01 NOTE — Assessment & Plan Note (Signed)
Occasional GERD only Will experience symptoms if she is not compliant with a GERD diet, which she typically has Over-the-counter medication if needed

## 2019-02-01 NOTE — Assessment & Plan Note (Signed)
Related to chronic arthritis Follows with orthopedics-has occasional injections Takes Tylenol 3 times a day We will try diclofenac gel

## 2019-02-01 NOTE — Assessment & Plan Note (Signed)
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

## 2019-02-01 NOTE — Assessment & Plan Note (Signed)
Controlled Infrequent migraines Maxalt as needed

## 2019-02-01 NOTE — Assessment & Plan Note (Addendum)
Controlled, stable Continue current dose of medication-sertraline 150 mg daily

## 2019-02-01 NOTE — Addendum Note (Signed)
Addended by: Delice Bison E on: 02/01/2019 01:20 PM   Modules accepted: Orders

## 2019-02-01 NOTE — Assessment & Plan Note (Signed)
Clinically euthyroid Check tsh  Titrate med dose if needed  

## 2019-02-01 NOTE — Assessment & Plan Note (Signed)
Chronic, intermittent pain Follows with orthopedics-has occasional injections Takes Tylenol 3 times a day We will try diclofenac gel

## 2019-02-01 NOTE — Assessment & Plan Note (Signed)
A1c

## 2019-02-02 ENCOUNTER — Other Ambulatory Visit (INDEPENDENT_AMBULATORY_CARE_PROVIDER_SITE_OTHER): Payer: Medicare Other

## 2019-02-02 DIAGNOSIS — R739 Hyperglycemia, unspecified: Secondary | ICD-10-CM | POA: Diagnosis not present

## 2019-02-02 DIAGNOSIS — E890 Postprocedural hypothyroidism: Secondary | ICD-10-CM | POA: Diagnosis not present

## 2019-02-02 DIAGNOSIS — E538 Deficiency of other specified B group vitamins: Secondary | ICD-10-CM | POA: Diagnosis not present

## 2019-02-02 DIAGNOSIS — M8580 Other specified disorders of bone density and structure, unspecified site: Secondary | ICD-10-CM | POA: Diagnosis not present

## 2019-02-02 LAB — CBC WITH DIFFERENTIAL/PLATELET
Basophils Absolute: 0 10*3/uL (ref 0.0–0.1)
Basophils Relative: 0.2 % (ref 0.0–3.0)
Eosinophils Absolute: 0.3 10*3/uL (ref 0.0–0.7)
Eosinophils Relative: 3.3 % (ref 0.0–5.0)
HCT: 39.6 % (ref 36.0–46.0)
Hemoglobin: 13.5 g/dL (ref 12.0–15.0)
Lymphocytes Relative: 15 % (ref 12.0–46.0)
Lymphs Abs: 1.2 10*3/uL (ref 0.7–4.0)
MCHC: 34 g/dL (ref 30.0–36.0)
MCV: 94.3 fl (ref 78.0–100.0)
Monocytes Absolute: 0.7 10*3/uL (ref 0.1–1.0)
Monocytes Relative: 9 % (ref 3.0–12.0)
Neutro Abs: 5.9 10*3/uL (ref 1.4–7.7)
Neutrophils Relative %: 72.5 % (ref 43.0–77.0)
Platelets: 257 10*3/uL (ref 150.0–400.0)
RBC: 4.2 Mil/uL (ref 3.87–5.11)
RDW: 12.8 % (ref 11.5–15.5)
WBC: 8.2 10*3/uL (ref 4.0–10.5)

## 2019-02-02 LAB — TSH: TSH: 1 u[IU]/mL (ref 0.35–4.50)

## 2019-02-02 LAB — COMPREHENSIVE METABOLIC PANEL
ALT: 19 U/L (ref 0–35)
AST: 21 U/L (ref 0–37)
Albumin: 4.1 g/dL (ref 3.5–5.2)
Alkaline Phosphatase: 70 U/L (ref 39–117)
BUN: 17 mg/dL (ref 6–23)
CO2: 27 mEq/L (ref 19–32)
Calcium: 8.9 mg/dL (ref 8.4–10.5)
Chloride: 103 mEq/L (ref 96–112)
Creatinine, Ser: 0.8 mg/dL (ref 0.40–1.20)
GFR: 70.72 mL/min (ref >60.00)
Glucose, Bld: 90 mg/dL (ref 70–99)
Potassium: 4 mEq/L (ref 3.5–5.1)
Sodium: 138 mEq/L (ref 135–145)
Total Bilirubin: 0.3 mg/dL (ref 0.2–1.2)
Total Protein: 6.7 g/dL (ref 6.0–8.3)

## 2019-02-02 LAB — VITAMIN B12: Vitamin B-12: 334 pg/mL (ref 211–911)

## 2019-02-02 LAB — LIPID PANEL
Cholesterol: 211 mg/dL — ABNORMAL HIGH (ref 0–200)
HDL: 72.8 mg/dL (ref >39.00)
LDL Cholesterol: 123 mg/dL — ABNORMAL HIGH (ref 0–99)
NonHDL: 138.61
Total CHOL/HDL Ratio: 3
Triglycerides: 80 mg/dL (ref 0.0–149.0)
VLDL: 16 mg/dL (ref 0.0–40.0)

## 2019-02-02 LAB — VITAMIN D 25 HYDROXY (VIT D DEFICIENCY, FRACTURES): VITD: 93.22 ng/mL (ref 30.00–100.00)

## 2019-02-02 LAB — HEMOGLOBIN A1C: Hgb A1c MFr Bld: 5.6 % (ref 4.6–6.5)

## 2019-02-03 ENCOUNTER — Encounter: Payer: Self-pay | Admitting: Internal Medicine

## 2019-02-04 ENCOUNTER — Telehealth: Payer: Medicare Other | Admitting: Physician Assistant

## 2019-02-04 ENCOUNTER — Encounter: Payer: Self-pay | Admitting: Internal Medicine

## 2019-02-04 DIAGNOSIS — R531 Weakness: Secondary | ICD-10-CM

## 2019-02-04 DIAGNOSIS — Z20822 Contact with and (suspected) exposure to covid-19: Secondary | ICD-10-CM

## 2019-02-04 DIAGNOSIS — T50Z95A Adverse effect of other vaccines and biological substances, initial encounter: Secondary | ICD-10-CM

## 2019-02-04 NOTE — Progress Notes (Signed)
  E-Visit for [employer] Virus Screening  Based on what you have shared with me, you need to seek an evaluation for a severe illness that is causing your symptoms which may be coronavirus or some other illness. I recommend that you be seen and evaluated "face to face". Our Emergency Departments are best equipped to handle patients with severe symptoms.    This could be a reaction to the pneumonia vaccine but giving you had it Wednesday and symptoms did not start till Thursday evening, there is also potential concern for coronavirus. Giving severity of symptoms, age and medical history, I recommend you have an in-person assessment.    You will be evaluated by the ER provider (or higher level of care provider) who will determine whether you need formal testing.   I recommend the following:  . If you are having a true medical emergency please call 911. . If you are considered high risk for Corona virus because of a known exposure, fever, shortness of breath and cough, OR if you have severe symptoms of any kind, seek medical care at an emergency room.   . [facility] Emergency Department [place], [place], [place] [address] [phone]  . [facility] [address] [facility] Emergency Department [place], [place], [place] [address] [phone]  . [facility] Emergency Department [place], [place], [place] [address] [phone]  . [facility] Emergency Department [address] [place], [place], [place] [address] [phone]  . [facility] Emergency Department [place], [place],  [address] [phone]  NOTE: If you entered your credit card information for this eVisit, you will not be charged. You may see a "hold" on your card for the $35 but that hold will drop off and you will not have a charge processed.   Your e-visit answers were reviewed by a board certified advanced  clinical practitioner to complete your personal care plan.  Thank you for using e-Visits.

## 2019-02-18 ENCOUNTER — Other Ambulatory Visit: Payer: Self-pay | Admitting: Internal Medicine

## 2019-02-25 ENCOUNTER — Other Ambulatory Visit: Payer: Self-pay | Admitting: Internal Medicine

## 2019-02-27 MED ORDER — LEVOTHYROXINE SODIUM 75 MCG PO TABS: 75.0000 ug | ORAL_TABLET | Freq: Every day | ORAL | 3 refills | Status: DC

## 2019-03-21 ENCOUNTER — Other Ambulatory Visit: Payer: Self-pay | Admitting: Obstetrics & Gynecology

## 2019-03-21 NOTE — Telephone Encounter (Signed)
Medication refill request: estrogen- Methitestosterone  Last AEX:  07/25/18 Next AEX:  None scheduled at this time.  Last MMG (if hormonal medication request): 02/08/18 Bi-rads 1 neg  Refill authorized:  #[id number] with no RF please advise

## 2019-04-11 ENCOUNTER — Other Ambulatory Visit: Payer: Self-pay | Admitting: Internal Medicine

## 2019-04-11 DIAGNOSIS — Z1231 Encounter for screening mammogram for malignant neoplasm of breast: Secondary | ICD-10-CM

## 2019-04-18 ENCOUNTER — Other Ambulatory Visit: Payer: Self-pay

## 2019-04-18 ENCOUNTER — Ambulatory Visit
Admission: RE | Admit: 2019-04-18 | Discharge: 2019-04-18 | Disposition: A | Discharge status: Home / Self Care | Payer: Medicare Other | Source: Ambulatory Visit | Attending: Internal Medicine | Admitting: Internal Medicine

## 2019-04-18 DIAGNOSIS — Z1231 Encounter for screening mammogram for malignant neoplasm of breast: Secondary | ICD-10-CM

## 2019-06-04 ENCOUNTER — Other Ambulatory Visit: Payer: Self-pay | Admitting: Internal Medicine

## 2019-06-04 DIAGNOSIS — F32A Depression, unspecified: Secondary | ICD-10-CM

## 2019-06-04 DIAGNOSIS — F329 Major depressive disorder, single episode, unspecified: Secondary | ICD-10-CM

## 2019-06-10 ENCOUNTER — Other Ambulatory Visit: Payer: Self-pay | Admitting: Internal Medicine

## 2019-07-07 ENCOUNTER — Other Ambulatory Visit: Payer: Self-pay | Admitting: Physician Assistant

## 2019-07-07 ENCOUNTER — Other Ambulatory Visit: Payer: Self-pay

## 2019-07-07 MED ORDER — ESOMEPRAZOLE MAGNESIUM 40 MG PO CPDR: 40.0000 mg | DELAYED_RELEASE_CAPSULE | Freq: Every day | ORAL | 1 refills | Status: DC

## 2019-07-07 MED ORDER — ESOMEPRAZOLE MAGNESIUM 40 MG PO CPDR: 40.0000 mg | DELAYED_RELEASE_CAPSULE | Freq: Every day | ORAL | 0 refills | Status: DC

## 2019-07-07 NOTE — Addendum Note (Signed)
Addended by: Elias Else on: 07/07/2019 03:06 PM   Modules accepted: Orders

## 2019-07-25 ENCOUNTER — Encounter: Payer: Self-pay | Admitting: Physician Assistant

## 2019-07-25 ENCOUNTER — Other Ambulatory Visit: Payer: Self-pay

## 2019-07-25 ENCOUNTER — Ambulatory Visit (INDEPENDENT_AMBULATORY_CARE_PROVIDER_SITE_OTHER): Payer: Medicare Other | Admitting: Physician Assistant

## 2019-07-25 VITALS — BP 148/82 | HR 67 | Temp 97.4°F | Ht 60.0 in | Wt 118.0 lb

## 2019-07-25 DIAGNOSIS — K219 Gastro-esophageal reflux disease without esophagitis: Secondary | ICD-10-CM | POA: Diagnosis not present

## 2019-07-25 DIAGNOSIS — K2100 Gastro-esophageal reflux disease with esophagitis, without bleeding: Secondary | ICD-10-CM | POA: Diagnosis not present

## 2019-07-25 MED ORDER — FAMOTIDINE 40 MG PO TABS: 40.0000 mg | ORAL_TABLET | Freq: Every day | ORAL | 3 refills | Status: DC

## 2019-07-25 MED ORDER — ESOMEPRAZOLE MAGNESIUM 40 MG PO CPDR: 40.0000 mg | DELAYED_RELEASE_CAPSULE | Freq: Every day | ORAL | 3 refills | Status: DC

## 2019-07-25 MED ORDER — SUCRALFATE 1 GM/10ML PO SUSP: 1.0000 g | Freq: Four times a day (QID) | ORAL | 0 refills | Status: DC

## 2019-07-25 NOTE — Progress Notes (Signed)
Subjective:    Patient ID: Jo Gomez, female    DOB: Sep 29, 1945, 73 y.o.   MRN: [id number]  HPI Jo Gomez is a pleasant 73 year old white female, known to Dr. Fuller Plan, who comes in today with complaints of significant acid reflux. She has history of GERD, chronic bronchitis, hypothyroidism, memory deficit, and selective IgA deficiency. She was last seen in the office in August 2019 with exacerbation of acid reflux.  She had not on any medication over the prior few years.  That episode seem to be triggered by an episode of bronchitis and antibiotic use.  She improved with a course of Nexium. She says she stayed on the Nexium for a month or 2 and then discontinued it and in the interim has used just as needed.  She does have concerns about staying on long-term Nexium because she has history of osteoporosis. He says about 6 weeks ago she started having a lot of heartburn indigestion belching burping and discomfort in the subxiphoid area.  She says she was so uncomfortable she could not even wear a bra because the tight band made her feel worse.  This was associated with some mild dysphagia especially to pills but not odynophagia.  She started back on Nexium 40 mg once daily about a month ago and has some mild improvement but says she is still miserable at times.  She is tried some spaghetti sauce recently and says that flared things up for days afterwards. She has not been on any aspirin or NSAIDs, no recent new medications antibiotics etc.  He generally does follow an antireflux regimen including elevation of the head of the bed.  Review of Systems Pertinent positive and negative review of systems were noted in the above HPI section.  All other review of systems was otherwise negative.  Outpatient Encounter Medications as of 07/25/2019  Medication Sig  . albuterol (VENTOLIN HFA) 108 (90 Base) MCG/ACT inhaler Inhale 2 puffs into the lungs every 6 (six) hours as needed for wheezing or shortness of  breath.  Jearl Klinefelter ELLIPTA 62.5-25 MCG/INH AEPB INHALE 1 PUFF INTO THE LUNGS DAILY  . calcium-vitamin D (OSCAL WITH D) 500-200 MG-UNIT TABS tablet Take by mouth daily.  . cyanocobalamin (,VITAMIN B-12,) 1000 MCG/ML injection Inject 1 Ml under the skin ever 30 days.  . diclofenac sodium (VOLTAREN) 1 % GEL Apply 4 g topically 4 (four) times daily.  Marland Kitchen esomeprazole (NEXIUM) 40 MG capsule Take 1 capsule (40 mg total) by mouth daily at 12 noon.  . EST ESTROGENS-METHYLTEST HS 0.625-1.25 MG tablet TAKE 1 TABLET BY MOUTH DAILY  . fluticasone (FLONASE) 50 MCG/ACT nasal spray as needed.   Marland Kitchen levothyroxine (SYNTHROID) 75 MCG tablet Take 1 tablet (75 mcg total) by mouth daily before breakfast.  . rizatriptan (MAXALT) 10 MG tablet Take 1 tablet (10 mg total) by mouth as needed for migraine. May repeat in 2 hours if needed  . sertraline (ZOLOFT) 100 MG tablet TAKE 1 AND 1/2 TABLETS(150 MG) BY MOUTH DAILY  . sodium chloride (OCEAN) 0.65 % SOLN nasal spray Place 1 spray into both nostrils as needed for congestion.  . traZODone (DESYREL) 150 MG tablet TAKE 1 TABLET(150 MG) BY MOUTH AT BEDTIME  . TUBERCULIN SYR 1CC/25GX5/8" (SAFETY-LOK TB SYR 1CC/25GX5/8") 25G X 5/8" 1 ML MISC USE AS DIRECTED FOR B-12 INJECTIONS.  Marland Kitchen valACYclovir (VALTREX) 1000 MG tablet TAKE 2 TABLETS BY MOUTH EVERY 12 HOURS FOR 1 DAY AS NEEDED  . Vitamin D, Ergocalciferol, (DRISDOL) 1.25 MG (50000  UT) CAPS capsule TAKE 1 CAPSULE BY MOUTH ONCE EVERY WEEK AS DIRECTED  . [DISCONTINUED] esomeprazole (NEXIUM) 40 MG capsule Take 1 capsule (40 mg total) by mouth daily at 12 noon.  . famotidine (PEPCID) 40 MG tablet Take 1 tablet (40 mg total) by mouth at bedtime.  . sucralfate (CARAFATE) 1 GM/10ML suspension Take 10 mLs (1 g total) by mouth 4 (four) times daily.   No facility-administered encounter medications on file as of 07/25/2019.    Allergies  Allergen Reactions  . Clarithromycin     Severe leg and muscle cramps  . Erythromycin Base Nausea And  Vomiting  . Levaquin [Levofloxacin In D5w]     tendonitis  . Codeine Rash    Syrup only, can take hydrocodone, oxycodone  . Other Other (See Comments) and Rash    IGA Deficiency - catches a lot of viruses  . Sulfonamide Derivatives Rash  . Tetracycline Rash   Patient Active Problem List   Diagnosis Date Noted  . Chronic bronchitis ([place]) 02/01/2019  . Migraine headache 01/31/2019  . Hyperglycemia 01/30/2019  . Chronic cough 01/02/2019  . Cardiac murmur 09/29/2016  . Pain of both shoulder joints 06/24/2016  . Biceps tendonitis 06/19/2016  . 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 Chronic sinusitis 01/22/2016  . Insomnia 09/16/2015  . Low back pain radiating to left leg 06/25/2014  . 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 HIATAL HERNIA WITH REFLUX 07/09/2010  . Vitamin B12 deficiency 03/05/2010  . Esophageal reflux 03/04/2010  . Selective IgA immunodeficiency ([place]) 10/18/2009  . Hypothyroidism 09/05/2007  . Allergic rhinitis 09/05/2007  . Depression 10/21/2006  . Osteopenia 10/21/2006   Social History   Socioeconomic History  . Marital status: Married    Spouse name: Jo Gomez  . Number of children: 1  . Years of education: Not on file  . Highest education level: Master's degree (e.g., MA, MS, MEng, MEd, MSW, MBA)  Occupational History    Employer: RETIRED  Social Needs  . Financial resource strain: Not on file  . Food insecurity    Worry: Not on file    Inability: Not on file  . Transportation needs    Medical: Not on file    Non-medical: Not on file  Tobacco Use  . Smoking status: Former Smoker    Packs/day: 0.50    Years: 3.00    Pack years: 1.50    Types: Cigarettes    Quit date: 08/24/1968    Years since quitting: 50.9  . Smokeless tobacco: Never Used  . Tobacco comment: in college  Substance and Sexual Activity  . Alcohol use: Yes    Alcohol/week: 2.0 standard drinks    Types: 2 Standard drinks or equivalent per week  .  Drug use: No  . Sexual activity: Yes    Partners: Female    Birth control/protection: Surgical    Comment: TAH/BSO  Lifestyle  . Physical activity    Days per week: Not on file    Minutes per session: Not on file  . Stress: Not on file  Relationships  . Social Herbalist on phone: Not on file    Gets together: Not on file    Attends religious service: Not on file    Active member of club or organization: Not on file    Attends meetings of clubs or organizations: Not on file    Relationship status: Not on file  . Intimate partner violence  Fear of current or ex partner: Not on file    Emotionally abused: Not on file    Physically abused: Not on file    Forced sexual activity: Not on file  Other Topics Concern  . Not on file  Social History Narrative   Patient is right-handed. She lives with her wife in a split level house. She occasionally drinks coffee.    Ms. Muzzey family history includes COPD in her father; Colon polyps in her father and mother; Heart attack in her mother; Lung cancer in her mother; Osteoporosis in her mother.      Objective:    Vitals:   07/25/19 1529  BP: (!) 148/82  Pulse: 67  Temp: (!) 97.4 F (36.3 C)    Physical Exam Well-developed well-nourished older white female in no acute distress.  Height, Weight, 118 BMI 23.0  HEENT; nontraumatic normocephalic, EOMI, PER R LA, sclera anicteric. Oropharynx; not examined/mask/Covid Neck; supple, no JVD Cardiovascular; regular rate and rhythm with S1-S2, no murmur rub or gallop Pulmonary; Clear bilaterally Abdomen; soft, there is some tenderness in the subxiphoid area,, nondistended, no palpable mass or hepatosplenomegaly, bowel sounds are active Rectal; not done today Skin; benign exam, no jaundice rash or appreciable lesions Extremities; no clubbing cyanosis or edema skin warm and dry Neuro/Psych; alert and oriented x4, grossly nonfocal mood and affect appropriate        Assessment & Plan:   #28 73 year old white female with history of GERD who had not been on chronic PPI therapy who presents with 6-week history of acute exacerbation with probable reflux esophagitis.  She has had some minimal improvement with once daily Nexium but continues to experience persistent daily symptoms.  #2 colon cancer surveillance-up-to-date last colonoscopy November 2017 normal, indicated for 5-year interval follow-up due to family history #3.  Chronic bronchitis #4.  Selective IgA deficiency #5.  Hypothyroidism  Plan; continue strict antireflux regimen, reviewed dietary restrictions.  Continue to sleep with head of the bed elevated and n.p.o. for 2 to 3 hours prior to bedtime.  Continue Nexium 40 mg p.o. every morning-refills sent Add Pepcid 40 mg nightly, prescription sent Add Carafate suspension 1 g between meals and at bedtime x2 weeks, may extend out to complete 1 month of helping. Patient is asked to call back in 2 to 3 weeks if she is not making significant progress, she will need to be scheduled for EGD with Dr. Fuller Plan which we discussed today.  Devann Cribb S Special Ranes PA-C 07/25/2019   Cc: Binnie Rail, MD

## 2019-07-25 NOTE — Progress Notes (Signed)
Reviewed and agree with management plan.  Kesean Serviss T. Izek Corvino, MD FACG [facility]  

## 2019-07-25 NOTE — Patient Instructions (Addendum)
If you are age 71 or older, your body mass index should be between 23-30. Your Body mass index is 23.05 kg/m. If this is out of the aforementioned range listed, please consider follow up with your Primary Care Provider.  If you are age 35 or younger, your body mass index should be between 19-25. Your Body mass index is 23.05 kg/m. If this is out of the aformentioned range listed, please consider follow up with your Primary Care Provider.   We have sent the following medications to your pharmacy for you to pick up at your convenience: Nexium - take 30-60 minutes before breakfast. Carafate Pepcid  Call back in 2-3 weeks if not better. Will need EGD with Dr. Fuller Plan.  Continue strict anti-reflux regimen.   Thank you for choosing me and [facility].   Amy Esterwood, PA-C

## 2019-08-28 ENCOUNTER — Telehealth: Payer: Self-pay | Admitting: Obstetrics & Gynecology

## 2019-08-28 NOTE — Telephone Encounter (Signed)
Patient sent the following message through [facility]. Routing to triage to assist patient with request. Please advise.  Appointment Request From: Jo Gomez    With Provider: Megan Salon, MD [facility]]    Preferred Date Range: 04/01/2020 - 04/08/2020    Preferred Times: Any Time    Reason for visit: Office Visit    Comments:  Annual check up, if Dr Sabra Heck feels she needs to see me yearly.

## 2019-08-28 NOTE — Telephone Encounter (Signed)
Left message for pt to call back to [facility] in triage or can schedule AEX with front office.

## 2019-09-05 ENCOUNTER — Other Ambulatory Visit: Payer: Self-pay | Admitting: Obstetrics & Gynecology

## 2019-09-10 ENCOUNTER — Encounter: Payer: Self-pay | Admitting: Internal Medicine

## 2019-09-12 ENCOUNTER — Encounter: Payer: Self-pay | Admitting: Internal Medicine

## 2019-09-12 NOTE — Telephone Encounter (Signed)
Left message to call Rishon Thilges, RN at [facility] [phone].   

## 2019-09-12 NOTE — Telephone Encounter (Signed)
Spoke with patient. Patient declines to schedule AEX at this time. Patient states she is concerned about Covid 92 with her health hx and age,  does not want to be in doctors office during this time.  Patient is requesting refills of Vitamin D 50,000 IU and estratest. Takes estratest 1/2 tab three times a week. Reviewed with patient current Covid19 precautions and restrictions, patient still declines. Patient request MyChart visit, if needed. Advised patient I will review with Dr. Sabra Heck and return call with recommendations, patient agreeable.   Last AEX 07/25/18.   Dr. Sabra Heck -please review and advise on Rx refills.

## 2019-09-12 NOTE — Telephone Encounter (Signed)
Patient returned a call to triage. Patient asking if she needs to be seen yearly?

## 2019-09-13 ENCOUNTER — Other Ambulatory Visit: Payer: Self-pay | Admitting: Obstetrics & Gynecology

## 2019-09-13 MED ORDER — VITAMIN D (ERGOCALCIFEROL) 1.25 MG (50000 UNIT) PO CAPS: ORAL_CAPSULE | ORAL | 1 refills | Status: DC

## 2019-09-13 MED ORDER — EST ESTROGENS-METHYLTEST HS 0.625-1.25 MG PO TABS: ORAL_TABLET | ORAL | 1 refills | Status: DC

## 2019-09-13 NOTE — Telephone Encounter (Signed)
There is no data to guide this decision since there were not sufficient patients with IgA deficiency enrolled in the trial.  I will provide my opinion however Given your age and IgA deficiency, you will likely get very sick if you do get Covid Hence would suggest that you do get the vaccine You may have some side effects but that risk would be worth taking considering the benefit of protection from Covid

## 2019-09-13 NOTE — Telephone Encounter (Signed)
Dr. Alva please advise. Thanks. 

## 2019-09-13 NOTE — Telephone Encounter (Signed)
Please let pt know I refilled these for 6 months.  I would like to see her at that time.  She should be vaccinated by then as well.  I did change the estrogen rx to read exactly how she takes it, three times weekly, just so she is aware.  RF will be for #36/1RF for the 6 months as well as the Vit D.  Her last MMG was 03/2019 and was negative.  Chart was reviewed and no new medical issues seem to be present this past year.  Ok to let pt know and close encounter.

## 2019-09-14 NOTE — Telephone Encounter (Signed)
Spoke with patient, advised per Dr. Sabra Heck. Patient verbalizes understanding and is agreeable. AEX scheduled for 03/01/20 at 10am.   Routing to provider for final review. Patient is agreeable to disposition. Will close encounter.

## 2019-10-04 ENCOUNTER — Encounter: Payer: Self-pay | Admitting: Internal Medicine

## 2019-10-04 ENCOUNTER — Other Ambulatory Visit: Payer: Self-pay

## 2019-10-04 MED ORDER — RIZATRIPTAN BENZOATE 10 MG PO TABS: 10.0000 mg | ORAL_TABLET | ORAL | 5 refills | Status: DC | PRN

## 2019-11-02 ENCOUNTER — Other Ambulatory Visit: Payer: Self-pay | Admitting: Internal Medicine

## 2019-11-02 ENCOUNTER — Encounter: Payer: Self-pay | Admitting: Internal Medicine

## 2019-11-02 DIAGNOSIS — F32A Depression, unspecified: Secondary | ICD-10-CM

## 2019-11-02 DIAGNOSIS — F329 Major depressive disorder, single episode, unspecified: Secondary | ICD-10-CM

## 2020-01-04 ENCOUNTER — Other Ambulatory Visit: Payer: Self-pay

## 2020-01-04 MED ORDER — CYANOCOBALAMIN 1000 MCG/ML IJ SOLN: INTRAMUSCULAR | 0 refills | Status: DC

## 2020-01-10 DIAGNOSIS — Q046 Congenital cerebral cysts: Secondary | ICD-10-CM | POA: Diagnosis not present

## 2020-01-10 DIAGNOSIS — M545 Low back pain: Secondary | ICD-10-CM | POA: Diagnosis not present

## 2020-01-10 DIAGNOSIS — G930 Cerebral cysts: Secondary | ICD-10-CM | POA: Diagnosis not present

## 2020-01-10 DIAGNOSIS — M5416 Radiculopathy, lumbar region: Secondary | ICD-10-CM | POA: Diagnosis not present

## 2020-01-10 DIAGNOSIS — I618 Other nontraumatic intracerebral hemorrhage: Secondary | ICD-10-CM | POA: Diagnosis not present

## 2020-01-31 ENCOUNTER — Other Ambulatory Visit: Payer: Self-pay | Admitting: Internal Medicine

## 2020-02-01 DIAGNOSIS — M5416 Radiculopathy, lumbar region: Secondary | ICD-10-CM | POA: Diagnosis not present

## 2020-02-05 NOTE — Patient Instructions (Addendum)
Blood work was ordered.     Medications reviewed and updated.  Changes include :   none    Please followup in 1 year   Health Maintenance, Female Adopting a healthy lifestyle and getting preventive care are important in promoting health and wellness. Ask your health care provider about:  The right schedule for you to have regular tests and exams.  Things you can do on your own to prevent diseases and keep yourself healthy. What should I know about diet, weight, and exercise? Eat a healthy diet   Eat a diet that includes plenty of vegetables, fruits, low-fat dairy products, and lean protein.  Do not eat a lot of foods that are high in solid fats, added sugars, or sodium. Maintain a healthy weight Body mass index (BMI) is used to identify weight problems. It estimates body fat based on height and weight. Your health care provider can help determine your BMI and help you achieve or maintain a healthy weight. Get regular exercise Get regular exercise. This is one of the most important things you can do for your health. Most adults should:  Exercise for at least 150 minutes each week. The exercise should increase your heart rate and make you sweat (moderate-intensity exercise).  Do strengthening exercises at least twice a week. This is in addition to the moderate-intensity exercise.  Spend less time sitting. Even light physical activity can be beneficial. Watch cholesterol and blood lipids Have your blood tested for lipids and cholesterol at 72 years of age, then have this test every 5 years. Have your cholesterol levels checked more often if:  Your lipid or cholesterol levels are high.  You are older than 72 years of age.  You are at high risk for heart disease. What should I know about cancer screening? Depending on your health history and family history, you may need to have cancer screening at various ages. This may include screening for:  Breast cancer.  Cervical  cancer.  Colorectal cancer.  Skin cancer.  Lung cancer. What should I know about heart disease, diabetes, and high blood pressure? Blood pressure and heart disease  High blood pressure causes heart disease and increases the risk of stroke. This is more likely to develop in people who have high blood pressure readings, are of African descent, or are overweight.  Have your blood pressure checked: ? Every 3-5 years if you are 42-68 years of age. ? Every year if you are 80 years old or older. Diabetes Have regular diabetes screenings. This checks your fasting blood sugar level. Have the screening done:  Once every three years after age 68 if you are at a normal weight and have a low risk for diabetes.  More often and at a younger age if you are overweight or have a high risk for diabetes. What should I know about preventing infection? Hepatitis B If you have a higher risk for hepatitis B, you should be screened for this virus. Talk with your health care provider to find out if you are at risk for hepatitis B infection. Hepatitis C Testing is recommended for:  Everyone born from [birth year] through 1963.  Anyone with known risk factors for hepatitis C. Sexually transmitted infections (STIs)  Get screened for STIs, including gonorrhea and chlamydia, if: ? You are sexually active and are younger than 72 years of age. ? You are older than 72 years of age and your health care provider tells you that you are at risk for this type  of infection. ? Your sexual activity has changed since you were last screened, and you are at increased risk for chlamydia or gonorrhea. Ask your health care provider if you are at risk.  Ask your health care provider about whether you are at high risk for HIV. Your health care provider may recommend a prescription medicine to help prevent HIV infection. If you choose to take medicine to prevent HIV, you should first get tested for HIV. You should then be tested every 3  months for as long as you are taking the medicine. Pregnancy  If you are about to stop having your period (premenopausal) and you may become pregnant, seek counseling before you get pregnant.  Take 400 to 800 micrograms (mcg) of folic acid every day if you become pregnant.  Ask for birth control (contraception) if you want to prevent pregnancy. Osteoporosis and menopause Osteoporosis is a disease in which the bones lose minerals and strength with aging. This can result in bone fractures. If you are 48 years old or older, or if you are at risk for osteoporosis and fractures, ask your health care provider if you should:  Be screened for bone loss.  Take a calcium or vitamin D supplement to lower your risk of fractures.  Be given hormone replacement therapy (HRT) to treat symptoms of menopause. Follow these instructions at home: Lifestyle  Do not use any products that contain nicotine or tobacco, such as cigarettes, e-cigarettes, and chewing tobacco. If you need help quitting, ask your health care provider.  Do not use street drugs.  Do not share needles.  Ask your health care provider for help if you need support or information about quitting drugs. Alcohol use  Do not drink alcohol if: ? Your health care provider tells you not to drink. ? You are pregnant, may be pregnant, or are planning to become pregnant.  If you drink alcohol: ? Limit how much you use to 0-1 drink a day. ? Limit intake if you are breastfeeding.  Be aware of how much alcohol is in your drink. In the [place] one drink equals one 12 oz bottle of beer (355 mL), one 5 oz glass of wine (148 mL), or one 1 oz glass of hard liquor (44 mL). General instructions  Schedule regular health, dental, and eye exams.  Stay current with your vaccines.  Tell your health care provider if: ? You often feel depressed. ? You have ever been abused or do not feel safe at home. Summary  Adopting a healthy lifestyle and getting  preventive care are important in promoting health and wellness.  Follow your health care provider's instructions about healthy diet, exercising, and getting tested or screened for diseases.  Follow your health care provider's instructions on monitoring your cholesterol and blood pressure. This information is not intended to replace advice given to you by your health care provider. Make sure you discuss any questions you have with your health care provider. Document Revised: 08/03/2018 Document Reviewed: 08/03/2018 Elsevier Patient Education  2020 [employer].

## 2020-02-05 NOTE — Progress Notes (Signed)
Subjective:    Patient ID: Jo Gomez, female    DOB: 06/21/1946, 74 y.o.   MRN: [id number]  HPI She is here for a physical exam.   She denies any changes in her health since she was here last and has no concerns.  Overall she feels well and feels she is doing well.  Medications and allergies reviewed with patient and updated if appropriate.  Patient Active Problem List   Diagnosis Date Noted  . Chronic bronchitis ([place]) 02/01/2019  . Migraine headache 01/31/2019  . Hyperglycemia 01/30/2019  . Chronic cough 01/02/2019  . Cardiac murmur 09/29/2016  . Pain of both shoulder joints 06/24/2016  . 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 Chronic sinusitis 01/22/2016  . Insomnia 09/16/2015  . Low back pain radiating to left leg 06/25/2014  . 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 Vitamin B12 deficiency 03/05/2010  . Esophageal reflux 03/04/2010  . 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 Hypothyroidism 09/05/2007  . Allergic rhinitis 09/05/2007  . Depression 10/21/2006  . Osteopenia 10/21/2006    Current Outpatient Medications on File Prior to Visit  Medication Sig Dispense Refill  . albuterol (VENTOLIN HFA) 108 (90 Base) MCG/ACT inhaler Inhale 2 puffs into the lungs every 6 (six) hours as needed for wheezing or shortness of breath. 1 Inhaler 2  . ANORO ELLIPTA 62.5-25 MCG/INH AEPB INHALE 1 PUFF INTO THE LUNGS DAILY 60 each 0  . cyanocobalamin (,VITAMIN B-12,) 1000 MCG/ML injection Inject 1 Ml under the skin ever 30 days. 30 mL 0  . esomeprazole (NEXIUM) 40 MG capsule Take 1 capsule (40 mg total) by mouth daily at 12 noon. 90 capsule 3  . estrogen-methylTESTOSTERone (EST ESTROGENS-METHYLTEST HS) 0.625-1.25 MG tablet 1 tab three times weekly. 36 tablet 1  . fluticasone (FLONASE) 50 MCG/ACT nasal spray as needed.  (Patient not taking: Reported on 02/06/2020)  5  . levothyroxine (SYNTHROID) 75 MCG tablet TAKE 1 TABLET(75 MCG) BY MOUTH  DAILY BEFORE BREAKFAST 90 tablet 3  . rizatriptan (MAXALT) 10 MG tablet Take 1 tablet (10 mg total) by mouth as needed for migraine. May repeat in 2 hours if needed 10 tablet 5  . sertraline (ZOLOFT) 100 MG tablet Take 1.5 tablets (150 mg total) by mouth daily. Annual appt due in June must see provider for future refills 135 tablet 0  . sodium chloride (OCEAN) 0.65 % SOLN nasal spray Place 1 spray into both nostrils as needed for congestion. 15 mL 0  . traZODone (DESYREL) 150 MG tablet TAKE 1 TABLET(150 MG) BY MOUTH AT BEDTIME 90 tablet 3  . TUBERCULIN SYR 1CC/25GX5/8" (SAFETY-LOK TB SYR 1CC/25GX5/8") 25G X 5/8" 1 ML MISC USE AS DIRECTED FOR B-12 INJECTIONS. 20 each 3  . valACYclovir (VALTREX) 1000 MG tablet TAKE 2 TABLETS BY MOUTH EVERY 12 HOURS FOR 1 DAY AS NEEDED 20 tablet 1  . Vitamin D, Ergocalciferol, (DRISDOL) 1.25 MG (50000 UNIT) CAPS capsule 1 capsule po q week. 12 capsule 1   No current facility-administered medications on file prior to visit.    Past Medical History:  Diagnosis Date  . Allergy   . Anxiety   . 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 Asthma   . Depression   . GERD (gastroesophageal reflux disease)    diet controlled, no med  . History of endoscopy   . HSV infection   . IgA deficiency ([place])   . Low vitamin B12 level   .  Osteoporosis   . Ovarian cyst    TAH/BSO  . Thyroid dysfunction     Past Surgical History:  Procedure Laterality Date  . broken arm     surgery for this  . BROW LIFT  1/15  . COLONOSCOPY    . THYROIDECTOMY  2000  . TOTAL ABDOMINAL HYSTERECTOMY W/ BILATERAL SALPINGOOPHORECTOMY  1999    Social History   Socioeconomic History  . Marital status: Married    Spouse name: [name]  . Number of children: 1  . Years of education: Not on file  . Highest education level: Master's degree (e.g., MA, MS, MEng, MEd, MSW, MBA)  Occupational History    Employer: RETIRED  Tobacco Use  . Smoking status: Former Smoker    Packs/day:  0.50    Years: 3.00    Pack years: 1.50    Types: Cigarettes    Quit date: 08/24/1968    Years since quitting: 51.4  . Smokeless tobacco: Never Used  . Tobacco comment: in college  Vaping Use  . Vaping Use: Never used  Substance and Sexual Activity  . Alcohol use: Yes    Alcohol/week: 2.0 standard drinks    Types: 2 Standard drinks or equivalent per week  . Drug use: No  . Sexual activity: Yes    Partners: Female    Birth control/protection: Surgical    Comment: TAH/BSO  Other Topics Concern  . Not on file  Social History Narrative   Patient is right-handed. She lives with her wife in a split level house. She occasionally drinks coffee.   Social Determinants of Health   Financial Resource Strain: Low Risk   . Difficulty of Paying Living Expenses: Not hard at all  Food Insecurity: No Food Insecurity  . Worried About Charity fundraiser in the Last Year: Never true  . Ran Out of Food in the Last Year: Never true  Transportation Needs: No Transportation Needs  . Lack of Transportation (Medical): No  . Lack of Transportation (Non-Medical): No  Physical Activity: Sufficiently Active  . Days of Exercise per Week: 7 days  . Minutes of Exercise per Session: 30 min  Stress: No Stress Concern Present  . Feeling of Stress : Not at all  Social Connections: Unknown  . Frequency of Communication with Friends and Family: More than three times a week  . Frequency of Social Gatherings with Friends and Family: More than three times a week  . Attends Religious Services: Patient refused  . Active Member of Clubs or Organizations: Yes  . Attends Archivist Meetings: More than 4 times per year  . Marital Status: Married    Family History  Problem Relation Age of Onset  . COPD Father        chronic lung infections  . Colon polyps Father   . Heart attack Mother   . Osteoporosis Mother   . Lung cancer Mother   . Colon polyps Mother   . Colon cancer Neg Hx   . Esophageal  cancer Neg Hx   . Stomach cancer Neg Hx   . Rectal cancer Neg Hx     Review of Systems  Constitutional: Negative for chills, fatigue and fever.  Eyes: Negative for visual disturbance.  Respiratory: Positive for cough (chronic, brings up sputum in morning). Negative for chest tightness, shortness of breath and wheezing.   Cardiovascular: Negative for chest pain, palpitations and leg swelling.  Gastrointestinal: Negative for abdominal pain, blood in stool, constipation, diarrhea and nausea.  Genitourinary: Negative for dysuria and hematuria.  Musculoskeletal: Positive for arthralgias (minimal) and back pain (occ back - gets occ injections).  Skin: Negative for color change and rash.  Neurological: Negative for dizziness, light-headedness and headaches.  Psychiatric/Behavioral: Positive for dysphoric mood. The patient is not nervous/anxious.        Objective:   Vitals:   02/06/20 1044  BP: 102/60  Pulse: (!) 56  Resp: 16  Temp: 97.6 F (36.4 C)  SpO2: 98%   Filed Weights   02/06/20 1044  Weight: 113 lb (51.3 kg)   Body mass index is 22.07 kg/m.  BP Readings from Last 3 Encounters:  02/06/20 102/60  02/06/20 102/60  07/25/19 (!) 148/82    Wt Readings from Last 3 Encounters:  02/06/20 113 lb (51.3 kg)  02/06/20 113 lb 3.2 oz (51.3 kg)  07/25/19 118 lb (53.5 kg)     Physical Exam Constitutional: She appears well-developed and well-nourished. No distress.  HENT:  Head: Normocephalic and atraumatic.  Right Ear: External ear normal. Normal ear canal and TM Left Ear: External ear normal.  Normal ear canal and TM Mouth/Throat: Oropharynx is clear and moist.  Eyes: Conjunctivae and EOM are normal.  Neck: Neck supple. No tracheal deviation present. No thyromegaly present.  No carotid bruit  Cardiovascular: Normal rate, regular rhythm and normal heart sounds.   No murmur heard.  No edema. Pulmonary/Chest: Effort normal and breath sounds normal. No respiratory distress.  She has no wheezes. She has no rales.  Breast: deferred   Abdominal: Soft. She exhibits no distension. There is no tenderness.  Lymphadenopathy: She has no cervical adenopathy.  Skin: Skin is warm and dry. She is not diaphoretic.  Psychiatric: She has a normal mood and affect. Her behavior is normal.        Assessment & Plan:   Physical exam: Screening blood work    ordered Immunizations  Up to date  Colonoscopy   Up to date  Mammogram   Up to date  Gyn  Up to date  Dexa  Due -  Does via gyn Eye exams  Due - will schedule Exercise  Yard work, walking Weight  Normal BMI Substance abuse   none  See Problem List for Assessment and Plan of chronic medical problems.   This visit occurred during the SARS-CoV-2 public health emergency.  Safety protocols were in place, including screening questions prior to the visit, additional usage of staff PPE, and extensive cleaning of exam room while observing appropriate contact time as indicated for disinfecting solutions.

## 2020-02-06 ENCOUNTER — Other Ambulatory Visit: Payer: Self-pay

## 2020-02-06 ENCOUNTER — Ambulatory Visit (INDEPENDENT_AMBULATORY_CARE_PROVIDER_SITE_OTHER): Payer: Medicare PPO

## 2020-02-06 ENCOUNTER — Ambulatory Visit (INDEPENDENT_AMBULATORY_CARE_PROVIDER_SITE_OTHER): Payer: Medicare PPO | Admitting: Internal Medicine

## 2020-02-06 ENCOUNTER — Encounter: Payer: Self-pay | Admitting: Internal Medicine

## 2020-02-06 VITALS — BP 102/60 | HR 56 | Temp 97.6°F | Resp 16 | Wt 113.0 lb

## 2020-02-06 VITALS — BP 102/60 | HR 56 | Temp 97.6°F | Resp 16 | Ht 60.0 in | Wt 113.2 lb

## 2020-02-06 DIAGNOSIS — E538 Deficiency of other specified B group vitamins: Secondary | ICD-10-CM

## 2020-02-06 DIAGNOSIS — G43909 Migraine, unspecified, not intractable, without status migrainosus: Secondary | ICD-10-CM | POA: Diagnosis not present

## 2020-02-06 DIAGNOSIS — K219 Gastro-esophageal reflux disease without esophagitis: Secondary | ICD-10-CM

## 2020-02-06 DIAGNOSIS — R739 Hyperglycemia, unspecified: Secondary | ICD-10-CM | POA: Diagnosis not present

## 2020-02-06 DIAGNOSIS — F3289 Other specified depressive episodes: Secondary | ICD-10-CM

## 2020-02-06 DIAGNOSIS — Q046 Congenital cerebral cysts: Secondary | ICD-10-CM

## 2020-02-06 DIAGNOSIS — Z0000 Encounter for general adult medical examination without abnormal findings: Secondary | ICD-10-CM

## 2020-02-06 DIAGNOSIS — Z136 Encounter for screening for cardiovascular disorders: Secondary | ICD-10-CM

## 2020-02-06 DIAGNOSIS — M8589 Other specified disorders of bone density and structure, multiple sites: Secondary | ICD-10-CM | POA: Diagnosis not present

## 2020-02-06 DIAGNOSIS — G4709 Other insomnia: Secondary | ICD-10-CM | POA: Diagnosis not present

## 2020-02-06 DIAGNOSIS — E890 Postprocedural hypothyroidism: Secondary | ICD-10-CM | POA: Diagnosis not present

## 2020-02-06 LAB — COMPREHENSIVE METABOLIC PANEL
ALT: 23 U/L (ref 0–35)
AST: 33 U/L (ref 0–37)
Albumin: 4.6 g/dL (ref 3.5–5.2)
Alkaline Phosphatase: 68 U/L (ref 39–117)
BUN: 14 mg/dL (ref 6–23)
CO2: 30 mEq/L (ref 19–32)
Calcium: 9.3 mg/dL (ref 8.4–10.5)
Chloride: 96 mEq/L (ref 96–112)
Creatinine, Ser: 0.71 mg/dL (ref 0.40–1.20)
GFR: 80.93 mL/min (ref >60.00)
Glucose, Bld: 98 mg/dL (ref 70–99)
Potassium: 4.2 mEq/L (ref 3.5–5.1)
Sodium: 130 mEq/L — ABNORMAL LOW (ref 135–145)
Total Bilirubin: 0.5 mg/dL (ref 0.2–1.2)
Total Protein: 7.4 g/dL (ref 6.0–8.3)

## 2020-02-06 LAB — CBC WITH DIFFERENTIAL/PLATELET
Basophils Absolute: 0 10*3/uL (ref 0.0–0.1)
Basophils Relative: 0.4 % (ref 0.0–3.0)
Eosinophils Absolute: 0 10*3/uL (ref 0.0–0.7)
Eosinophils Relative: 0.4 % (ref 0.0–5.0)
HCT: 40.7 % (ref 36.0–46.0)
Hemoglobin: 14.1 g/dL (ref 12.0–15.0)
Lymphocytes Relative: 11.5 % — ABNORMAL LOW (ref 12.0–46.0)
Lymphs Abs: 1.2 10*3/uL (ref 0.7–4.0)
MCHC: 34.6 g/dL (ref 30.0–36.0)
MCV: 91.1 fl (ref 78.0–100.0)
Monocytes Absolute: 1 10*3/uL (ref 0.1–1.0)
Monocytes Relative: 9.3 % (ref 3.0–12.0)
Neutro Abs: 8 10*3/uL — ABNORMAL HIGH (ref 1.4–7.7)
Neutrophils Relative %: 78.4 % — ABNORMAL HIGH (ref 43.0–77.0)
Platelets: 270 10*3/uL (ref 150.0–400.0)
RBC: 4.47 Mil/uL (ref 3.87–5.11)
RDW: 13.3 % (ref 11.5–15.5)
WBC: 10.2 10*3/uL (ref 4.0–10.5)

## 2020-02-06 LAB — HEMOGLOBIN A1C: Hgb A1c MFr Bld: 6.1 % (ref 4.6–6.5)

## 2020-02-06 LAB — VITAMIN D 25 HYDROXY (VIT D DEFICIENCY, FRACTURES): VITD: 86.34 ng/mL (ref 30.00–100.00)

## 2020-02-06 LAB — TSH: TSH: 1.45 u[IU]/mL (ref 0.35–4.50)

## 2020-02-06 LAB — LIPID PANEL
Cholesterol: 192 mg/dL (ref 0–200)
HDL: 92 mg/dL (ref >39.00)
LDL Cholesterol: 85 mg/dL (ref 0–99)
NonHDL: 99.55
Total CHOL/HDL Ratio: 2
Triglycerides: 72 mg/dL (ref 0.0–149.0)
VLDL: 14.4 mg/dL (ref 0.0–40.0)

## 2020-02-06 NOTE — Assessment & Plan Note (Signed)
Chronic DEXA due-managed by GYN-they will order Continue good activity Taking multivitamin daily

## 2020-02-06 NOTE — Assessment & Plan Note (Signed)
Chronic Monitored by [facility] MRI done recently-stable They will continue to monitor on an annual basis

## 2020-02-06 NOTE — Assessment & Plan Note (Signed)
Chronic Controlled, stable Continue current dose of medication trazodone 

## 2020-02-06 NOTE — Patient Instructions (Signed)
[name] , Thank you for taking time to come for your Medicare Wellness Visit. I appreciate your ongoing commitment to your health goals. Please review the following plan we discussed and let me know if I can assist you in the future.   Screening recommendations/referrals: Colonoscopy: last done 07/15/2016; due every 5 years Mammogram: last done 04/18/2019; due every 1 year Bone Density: last done 11/19/2016; due every 2-3 years; overdue Recommended yearly ophthalmology/optometry visit for glaucoma screening and checkup Recommended yearly dental visit for hygiene and checkup  Vaccinations: Influenza vaccine: 06/25/2015; overdue Pneumococcal vaccine: completed Tdap vaccine: 11/30/2013; due every 10 years Shingles vaccine: never done  Covid-19: completed (Moderna)  Advanced directives: Please bring a copy of your health care power of attorney and living will to the office at your convenience.  Conditions/risks identified: Reviewed health maintenance screenings with patient today and relevant education, vaccines, and/or referrals were provided. Continue doing brain stimulating activities (puzzles, reading, adult coloring books, staying active) to keep memory sharp. Continue to eat heart healthy diet (full of fruits, vegetables, whole grains, lean protein, water--limit salt, fat, and sugar intake) and increase physical activity as tolerated.  Next appointment: Please schedule your next Medicare Wellness Visit with your Nurse Health Advisor in 1 year.   Preventive Care 72 Years and Older, Female Preventive care refers to lifestyle choices and visits with your health care provider that can promote health and wellness. What does preventive care include?  A yearly physical exam. This is also called an annual well check.  Dental exams once or twice a year.  Routine eye exams. Ask your health care provider how often you should have your eyes checked.  Personal lifestyle choices, including:   Daily care of your teeth and gums.  Regular physical activity.  Eating a healthy diet.  Avoiding tobacco and drug use.  Limiting alcohol use.  Practicing safe sex.  Taking low-dose aspirin every day.  Taking vitamin and mineral supplements as recommended by your health care provider. What happens during an annual well check? The services and screenings done by your health care provider during your annual well check will depend on your age, overall health, lifestyle risk factors, and family history of disease. Counseling  Your health care provider may ask you questions about your:  Alcohol use.  Tobacco use.  Drug use.  Emotional well-being.  Home and relationship well-being.  Sexual activity.  Eating habits.  History of falls.  Memory and ability to understand (cognition).  Work and work Statistician.  Reproductive health. Screening  You may have the following tests or measurements:  Height, weight, and BMI.  Blood pressure.  Lipid and cholesterol levels. These may be checked every 5 years, or more frequently if you are over 69 years old.  Skin check.  Lung cancer screening. You may have this screening every year starting at age 46 if you have a 30-pack-year history of smoking and currently smoke or have quit within the past 15 years.  Fecal occult blood test (FOBT) of the stool. You may have this test every year starting at age 38.  Flexible sigmoidoscopy or colonoscopy. You may have a sigmoidoscopy every 5 years or a colonoscopy every 10 years starting at age 68.  Hepatitis C blood test.  Hepatitis B blood test.  Sexually transmitted disease (STD) testing.  Diabetes screening. This is done by checking your blood sugar (glucose) after you have not eaten for a while (fasting). You may have this done every 1-3 years.  Bone  density scan. This is done to screen for osteoporosis. You may have this done starting at age 4.  Mammogram. This may be done  every 1-2 years. Talk to your health care provider about how often you should have regular mammograms. Talk with your health care provider about your test results, treatment options, and if necessary, the need for more tests. Vaccines  Your health care provider may recommend certain vaccines, such as:  Influenza vaccine. This is recommended every year.  Tetanus, diphtheria, and acellular pertussis (Tdap, Td) vaccine. You may need a Td booster every 10 years.  Zoster vaccine. You may need this after age 45.  Pneumococcal 13-valent conjugate (PCV13) vaccine. One dose is recommended after age 72.  Pneumococcal polysaccharide (PPSV23) vaccine. One dose is recommended after age 5. Talk to your health care provider about which screenings and vaccines you need and how often you need them. This information is not intended to replace advice given to you by your health care provider. Make sure you discuss any questions you have with your health care provider. Document Released: 09/06/2015 Document Revised: 04/29/2016 Document Reviewed: 06/11/2015 Elsevier Interactive Patient Education  2017 [place] Prevention in the Home Falls can cause injuries. They can happen to people of all ages. There are many things you can do to make your home safe and to help prevent falls. What can I do on the outside of my home?  Regularly fix the edges of walkways and driveways and fix any cracks.  Remove anything that might make you trip as you walk through a door, such as a raised step or threshold.  Trim any bushes or trees on the path to your home.  Use bright outdoor lighting.  Clear any walking paths of anything that might make someone trip, such as rocks or tools.  Regularly check to see if handrails are loose or broken. Make sure that both sides of any steps have handrails.  Any raised decks and porches should have guardrails on the edges.  Have any leaves, snow, or ice cleared regularly.   Use sand or salt on walking paths during winter.  Clean up any spills in your garage right away. This includes oil or grease spills. What can I do in the bathroom?  Use night lights.  Install grab bars by the toilet and in the tub and shower. Do not use towel bars as grab bars.  Use non-skid mats or decals in the tub or shower.  If you need to sit down in the shower, use a plastic, non-slip stool.  Keep the floor dry. Clean up any water that spills on the floor as soon as it happens.  Remove soap buildup in the tub or shower regularly.  Attach bath mats securely with double-sided non-slip rug tape.  Do not have throw rugs and other things on the floor that can make you trip. What can I do in the bedroom?  Use night lights.  Make sure that you have a light by your bed that is easy to reach.  Do not use any sheets or blankets that are too big for your bed. They should not hang down onto the floor.  Have a firm chair that has side arms. You can use this for support while you get dressed.  Do not have throw rugs and other things on the floor that can make you trip. What can I do in the kitchen?  Clean up any spills right away.  Avoid walking on  wet floors.  Keep items that you use a lot in easy-to-reach places.  If you need to reach something above you, use a strong step stool that has a grab bar.  Keep electrical cords out of the way.  Do not use floor polish or wax that makes floors slippery. If you must use wax, use non-skid floor wax.  Do not have throw rugs and other things on the floor that can make you trip. What can I do with my stairs?  Do not leave any items on the stairs.  Make sure that there are handrails on both sides of the stairs and use them. Fix handrails that are broken or loose. Make sure that handrails are as long as the stairways.  Check any carpeting to make sure that it is firmly attached to the stairs. Fix any carpet that is loose or worn.   Avoid having throw rugs at the top or bottom of the stairs. If you do have throw rugs, attach them to the floor with carpet tape.  Make sure that you have a light switch at the top of the stairs and the bottom of the stairs. If you do not have them, ask someone to add them for you. What else can I do to help prevent falls?  Wear shoes that:  Do not have high heels.  Have rubber bottoms.  Are comfortable and fit you well.  Are closed at the toe. Do not wear sandals.  If you use a stepladder:  Make sure that it is fully opened. Do not climb a closed stepladder.  Make sure that both sides of the stepladder are locked into place.  Ask someone to hold it for you, if possible.  Clearly mark and make sure that you can see:  Any grab bars or handrails.  First and last steps.  Where the edge of each step is.  Use tools that help you move around (mobility aids) if they are needed. These include:  Canes.  Walkers.  Scooters.  Crutches.  Turn on the lights when you go into a dark area. Replace any light bulbs as soon as they burn out.  Set up your furniture so you have a clear path. Avoid moving your furniture around.  If any of your floors are uneven, fix them.  If there are any pets around you, be aware of where they are.  Review your medicines with your doctor. Some medicines can make you feel dizzy. This can increase your chance of falling. Ask your doctor what other things that you can do to help prevent falls. This information is not intended to replace advice given to you by your health care provider. Make sure you discuss any questions you have with your health care provider. Document Released: 06/06/2009 Document Revised: 01/16/2016 Document Reviewed: 09/14/2014 Elsevier Interactive Patient Education  2017 [employer].

## 2020-02-06 NOTE — Progress Notes (Signed)
Subjective:   Jo Gomez is a 74 y.o. female who presents for Medicare Annual (Subsequent) preventive examination.  Review of Systems:  No ROS. Medicare Wellness Visit. Additional risk factors are reflected in social history. Cardiac Risk Factors include: advanced age (>59men, >15 women);family history of premature cardiovascular disease     Objective:     Vitals: BP 102/60 (BP Location: Right Arm, Patient Position: Sitting, Cuff Size: Normal)   Pulse (!) 56   Temp 97.6 F (36.4 C)   Resp 16   Ht 5' (1.524 m)   Wt 113 lb 3.2 oz (51.3 kg)   LMP 08/24/1997   SpO2 98%   BMI 22.11 kg/m   Body mass index is 22.11 kg/m.  Advanced Directives 02/06/2020 05/13/2016 11/01/2014  Does Patient Have a Medical Advance Directive? Yes Yes No  Type of Advance Directive Living will Living will -  Does patient want to make changes to medical advance directive? No - Patient declined - -    Tobacco Social History   Tobacco Use  Smoking Status Former Smoker  . Packs/day: 0.50  . Years: 3.00  . Pack years: 1.50  . Types: Cigarettes  . Quit date: 08/24/1968  . Years since quitting: 51.4  Smokeless Tobacco Never Used  Tobacco Comment   in college     Counseling given: No Comment: in college   Clinical Intake:  Pre-visit preparation completed: Yes  Pain : No/denies pain Pain Score: 0-No pain     Nutritional Risks: None Diabetes: No  How often do you need to have someone help you when you read instructions, pamphlets, or other written materials from your doctor or pharmacy?: 1 - Never What is the last grade level you completed in school?: Master's Degree  Interpreter Needed?: No  Information entered by :: Daelyn Mozer N. Lowell Guitar, LPN  Past Medical History:  Diagnosis Date  . Allergy   . Anxiety   . 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 Asthma   . Depression   . GERD (gastroesophageal reflux disease)    diet controlled, no med  . History of endoscopy    . HSV infection   . 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 Low vitamin B12 level   . Osteoporosis   . Ovarian cyst    TAH/BSO  . Thyroid dysfunction    Past Surgical History:  Procedure Laterality Date  . broken arm     surgery for this  . BROW LIFT  1/15  . COLONOSCOPY    . THYROIDECTOMY  2000  . TOTAL ABDOMINAL HYSTERECTOMY W/ BILATERAL SALPINGOOPHORECTOMY  1999   Family History  Problem Relation Age of Onset  . COPD Father        chronic lung infections  . Colon polyps Father   . Heart attack Mother   . Osteoporosis Mother   . Lung cancer Mother   . Colon polyps Mother   . Colon cancer Neg Hx   . Esophageal cancer Neg Hx   . Stomach cancer Neg Hx   . Rectal cancer Neg Hx    Social History   Socioeconomic History  . Marital status: Married    Spouse name: [name]  . Number of children: 1  . Years of education: Not on file  . Highest education level: Master's degree (e.g., MA, MS, MEng, MEd, MSW, MBA)  Occupational History    Employer: RETIRED  Tobacco Use  . Smoking status: Former Smoker    Packs/day: 0.50  Years: 3.00    Pack years: 1.50    Types: Cigarettes    Quit date: 08/24/1968    Years since quitting: 51.4  . Smokeless tobacco: Never Used  . Tobacco comment: in college  Vaping Use  . Vaping Use: Never used  Substance and Sexual Activity  . Alcohol use: Yes    Alcohol/week: 2.0 standard drinks    Types: 2 Standard drinks or equivalent per week  . Drug use: No  . Sexual activity: Yes    Partners: Female    Birth control/protection: Surgical    Comment: TAH/BSO  Other Topics Concern  . Not on file  Social History Narrative   Patient is right-handed. She lives with her wife in a split level house. She occasionally drinks coffee.   Social Determinants of Health   Financial Resource Strain:   . Difficulty of Paying Living Expenses:   Food Insecurity:   . Worried About Charity fundraiser in the Last Year:   . Arboriculturist in the Last Year:     Transportation Needs:   . Film/video editor (Medical):   Marland Kitchen Lack of Transportation (Non-Medical):   Physical Activity:   . Days of Exercise per Week:   . Minutes of Exercise per Session:   Stress:   . Feeling of Stress :   Social Connections:   . Frequency of Communication with Friends and Family:   . Frequency of Social Gatherings with Friends and Family:   . Attends Religious Services:   . Active Member of Clubs or Organizations:   . Attends Archivist Meetings:   Marland Kitchen Marital Status:     Outpatient Encounter Medications as of 02/06/2020  Medication Sig  . albuterol (VENTOLIN HFA) 108 (90 Base) MCG/ACT inhaler Inhale 2 puffs into the lungs every 6 (six) hours as needed for wheezing or shortness of breath.  Jearl Klinefelter ELLIPTA 62.5-25 MCG/INH AEPB INHALE 1 PUFF INTO THE LUNGS DAILY  . cyanocobalamin (,VITAMIN B-12,) 1000 MCG/ML injection Inject 1 Ml under the skin ever 30 days.  Marland Kitchen esomeprazole (NEXIUM) 40 MG capsule Take 1 capsule (40 mg total) by mouth daily at 12 noon.  . estrogen-methylTESTOSTERone (EST ESTROGENS-METHYLTEST HS) 0.625-1.25 MG tablet 1 tab three times weekly.  Marland Kitchen levothyroxine (SYNTHROID) 75 MCG tablet TAKE 1 TABLET(75 MCG) BY MOUTH DAILY BEFORE BREAKFAST  . rizatriptan (MAXALT) 10 MG tablet Take 1 tablet (10 mg total) by mouth as needed for migraine. May repeat in 2 hours if needed  . sertraline (ZOLOFT) 100 MG tablet Take 1.5 tablets (150 mg total) by mouth daily. Annual appt due in June must see provider for future refills  . sodium chloride (OCEAN) 0.65 % SOLN nasal spray Place 1 spray into both nostrils as needed for congestion.  . traZODone (DESYREL) 150 MG tablet TAKE 1 TABLET(150 MG) BY MOUTH AT BEDTIME  . TUBERCULIN SYR 1CC/25GX5/8" (SAFETY-LOK TB SYR 1CC/25GX5/8") 25G X 5/8" 1 ML MISC USE AS DIRECTED FOR B-12 INJECTIONS.  Marland Kitchen valACYclovir (VALTREX) 1000 MG tablet TAKE 2 TABLETS BY MOUTH EVERY 12 HOURS FOR 1 DAY AS NEEDED  . Vitamin D, Ergocalciferol,  (DRISDOL) 1.25 MG (50000 UNIT) CAPS capsule 1 capsule po q week.  . calcium-vitamin D (OSCAL WITH D) 500-200 MG-UNIT TABS tablet Take by mouth daily. (Patient not taking: Reported on 02/06/2020)  . diclofenac sodium (VOLTAREN) 1 % GEL Apply 4 g topically 4 (four) times daily. (Patient not taking: Reported on 02/06/2020)  . famotidine (PEPCID) 40 MG  tablet Take 1 tablet (40 mg total) by mouth at bedtime. (Patient not taking: Reported on 02/06/2020)  . fluticasone (FLONASE) 50 MCG/ACT nasal spray as needed.  (Patient not taking: Reported on 02/06/2020)  . sucralfate (CARAFATE) 1 GM/10ML suspension Take 10 mLs (1 g total) by mouth 4 (four) times daily. (Patient not taking: Reported on 02/06/2020)   No facility-administered encounter medications on file as of 02/06/2020.    Activities of Daily Living In your present state of health, do you have any difficulty performing the following activities: 02/06/2020  Hearing? N  Vision? N  Difficulty concentrating or making decisions? N  Walking or climbing stairs? N  Dressing or bathing? N  Doing errands, shopping? N  Preparing Food and eating ? N  Using the Toilet? N  In the past six months, have you accidently leaked urine? N  Do you have problems with loss of bowel control? N  Managing your Medications? N  Managing your Finances? N  Housekeeping or managing your Housekeeping? N  Some recent data might be hidden    Patient Care Team: Binnie Rail, MD as PCP - General (Internal Medicine)    Assessment:   This is a routine wellness examination for [place].  Exercise Activities and Dietary recommendations Current Exercise Habits: Home exercise routine (yard work and walking), Type of exercise: walking, Time (Minutes): 30, Frequency (Times/Week): 7, Weekly Exercise (Minutes/Week): 210, Intensity: Moderate, Exercise limited by: None identified  Goals    .  Client understands the importance of follow-up with providers by attending scheduled visits  (pt-stated)      To maintain my current health status by continuing to eat healthy and stay physically active & socially active.       Fall Risk Fall Risk  02/06/2020 02/01/2019 04/06/2018 11/05/2017 04/29/2016  Falls in the past year? 0 0 Yes No No  Number falls in past yr: 0 0 2 or more - -  Injury with Fall? 0 - No - -  Risk Factor Category  - - High Fall Risk - -  Risk for fall due to : No Fall Risks - - - -  Follow up Falls evaluation completed;Education provided - - - -   Is the patient's home free of loose throw rugs in walkways, pet beds, electrical cords, etc?   yes      Grab bars in the bathroom? yes      Handrails on the stairs?   yes      Adequate lighting?   yes  Timed Get Up and Go performed: not indicated  Depression Screen PHQ 2/9 Scores 02/06/2020 02/01/2019 11/05/2017 04/29/2016  PHQ - 2 Score 0 0 0 0  PHQ- 9 Score - 0 - -     Cognitive Function   Montreal Cognitive Assessment  04/06/2018  Visuospatial/ Executive (0/5) 4  Naming (0/3) 3  Attention: Read list of digits (0/2) 2  Attention: Read list of letters (0/1) 1  Attention: Serial 7 subtraction starting at 100 (0/3) 1  Language: Repeat phrase (0/2) 2  Language : Fluency (0/1) 1  Abstraction (0/2) 2  Delayed Recall (0/5) 0  Orientation (0/6) 6  Total 22  Adjusted Score (based on education) 22   6CIT Screen 02/06/2020  What Year? 0 points  What month? 0 points  What time? 0 points  Count back from 20 0 points  Months in reverse 0 points  Repeat phrase 0 points  Total Score 0    Immunization History  Administered  Date(s) Administered  . Influenza Split 07/13/2011, 08/08/2012  . Influenza Whole 04/21/2010  . Influenza,inj,Quad PF,6+ Mos 05/26/2013, 06/25/2014  . Influenza-Unspecified 06/25/2015  . Moderna SARS-COVID-2 Vaccination 09/26/2019, 10/27/2019  . Pneumococcal Conjugate-13 06/25/2014  . Pneumococcal Polysaccharide-23 11/23/1978, 08/24/1996, 03/25/2007, 02/01/2019  . Td 08/24/2002  . Tdap  11/30/2013    Qualifies for Shingles Vaccine? Yes  Screening Tests Health Maintenance  Topic Date Due  . DEXA SCAN  11/20/2019  . INFLUENZA VACCINE  03/24/2020  . MAMMOGRAM  04/17/2021  . COLONOSCOPY  07/15/2021  . TETANUS/TDAP  12/01/2023  . COVID-19 Vaccine  Completed  . Hepatitis C Screening  Completed  . PNA vac Low Risk Adult  Completed    Cancer Screenings: Lung: Low Dose CT Chest recommended if Age 2-80 years, 30 pack-year currently smoking OR have quit w/in 15years. Patient does not qualify. Breast:  Up to date on Mammogram? Yes   Up to date of Bone Density/Dexa? Yes Colorectal: Yes  Additional Screenings: Hepatitis C Screening: completed     Plan:     Reviewed health maintenance screenings with patient today and relevant education, vaccines, and/or referrals were provided.    Continue doing brain stimulating activities (puzzles, reading, adult coloring books, staying active) to keep memory sharp.    Continue to eat heart healthy diet (full of fruits, vegetables, whole grains, lean protein, water--limit salt, fat, and sugar intake) and increase physical activity as tolerated.  I have personally reviewed and noted the following in the patient's chart:   . Medical and social history . Use of alcohol, tobacco or illicit drugs  . Current medications and supplements . Functional ability and status . Nutritional status . Physical activity . Advanced directives . List of other physicians . Hospitalizations, surgeries, and ER visits in previous 12 months . Vitals . Screenings to include cognitive, depression, and falls . Referrals and appointments  In addition, I have reviewed and discussed with patient certain preventive protocols, quality metrics, and best practice recommendations. A written personalized care plan for preventive services as well as general preventive health recommendations were provided to patient.     Sheral Flow, LPN  01/23/931    Nurse Health Advisor

## 2020-02-06 NOTE — Assessment & Plan Note (Signed)
a1c

## 2020-02-06 NOTE — Assessment & Plan Note (Signed)
Chronic B12 injections at home

## 2020-02-06 NOTE — Assessment & Plan Note (Signed)
Chronic  Clinically euthyroid Check tsh  Titrate med dose if needed  

## 2020-02-06 NOTE — Assessment & Plan Note (Signed)
Chronic GERD controlled Continue daily medication Nexium 40 mg daily

## 2020-02-06 NOTE — Assessment & Plan Note (Signed)
Chronic Infrequent Maxalt effective-takes only as needed Continue

## 2020-02-06 NOTE — Assessment & Plan Note (Signed)
Chronic Controlled, stable Continue current dose of medication Sertraline 150 mg daily

## 2020-02-07 ENCOUNTER — Encounter: Payer: Self-pay | Admitting: Internal Medicine

## 2020-02-07 MED ORDER — VALACYCLOVIR HCL 1 G PO TABS: ORAL_TABLET | ORAL | 3 refills | Status: DC

## 2020-02-13 ENCOUNTER — Encounter: Payer: Self-pay | Admitting: Internal Medicine

## 2020-02-13 DIAGNOSIS — Z0184 Encounter for antibody response examination: Secondary | ICD-10-CM

## 2020-02-16 ENCOUNTER — Other Ambulatory Visit: Payer: Medicare PPO

## 2020-02-16 DIAGNOSIS — Z0184 Encounter for antibody response examination: Secondary | ICD-10-CM

## 2020-02-20 LAB — SARS-COV-2 SEMI-QUANTITATIVE TOTAL ANTIBODY, SPIKE: SARS COV2 AB, Total Spike Semi QN: 912.9 U/mL — ABNORMAL HIGH (ref <0.8)

## 2020-02-22 NOTE — Progress Notes (Signed)
74 y.o. G1P1 Married White or Caucasian female here for annual exam.  Saw Dr. Quay Burow last month.  Blood was done.  She wanted Covid antibody testing done as she has IgA deficiency.  Other blood work was normal.    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    Patient's last menstrual period was 08/24/1997.          Sexually active: Yes.    The current method of family planning is status post hysterectomy.    Exercising: Yes.    walking & hiking Smoker:  no  Health Maintenance: Pap:  2004  History of abnormal Pap:  no MMG:  04-20-2019 category b density birads 1:neg Colonoscopy:  07-15-16 normal f/u 5 yrs BMD:   11-19-16 osteopenia TDaP:  2015 Pneumonia vaccine(s):  2020 Shingrix:   Not done Hep C testing: neg 2017 Screening Labs: 01/2020   reports that she quit smoking about 51 years ago. Her smoking use included cigarettes. She has a 1.50 pack-year smoking history. She has never used smokeless tobacco. She reports current alcohol use of about 2.0 standard drinks of alcohol per week. She reports that she does not use drugs.  Past Medical History:  Diagnosis Date  . Allergy   . Anxiety   . 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 Asthma   . Depression   . GERD (gastroesophageal reflux disease)    diet controlled, no med  . History of endoscopy   . HSV infection   . 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 Low vitamin B12 level   . Osteoporosis   . Ovarian cyst    TAH/BSO  . Shingles   . Thyroid dysfunction     Past Surgical History:  Procedure Laterality Date  . broken arm     surgery for this  . BROW LIFT  1/15  . COLONOSCOPY    . THYROIDECTOMY  2000  . TOTAL ABDOMINAL HYSTERECTOMY W/ BILATERAL SALPINGOOPHORECTOMY  1999    Current Outpatient Medications  Medication Sig Dispense Refill  . albuterol (VENTOLIN HFA) 108 (90 Base) MCG/ACT inhaler Inhale 2 puffs into the lungs every  6 (six) hours as needed for wheezing or shortness of breath. 1 Inhaler 2  . calcium-vitamin D (OSCAL WITH D) 500-200 MG-UNIT TABS tablet Take by mouth.    . cyanocobalamin (,VITAMIN B-12,) 1000 MCG/ML injection Inject 1 Ml under the skin ever 30 days. 30 mL 0  . esomeprazole (NEXIUM) 40 MG capsule Take 1 capsule (40 mg total) by mouth daily at 12 noon. 90 capsule 3  . estrogen-methylTESTOSTERone (EST ESTROGENS-METHYLTEST HS) 0.625-1.25 MG tablet 1 tab three times weekly. 36 tablet 1  . levothyroxine (SYNTHROID) 75 MCG tablet TAKE 1 TABLET(75 MCG) BY MOUTH DAILY BEFORE BREAKFAST 90 tablet 3  . rizatriptan (MAXALT) 10 MG tablet Take 1 tablet (10 mg total) by mouth as needed for migraine. May repeat in 2 hours if needed 10 tablet 5  . sertraline (ZOLOFT) 100 MG tablet Take 1.5 tablets (150 mg total) by mouth daily. Annual appt due in June must see provider for future refills 135 tablet 0  . sodium chloride (OCEAN) 0.65 % SOLN nasal spray Place 1 spray into both nostrils as needed for congestion. 15 mL 0  . traZODone (DESYREL) 150 MG tablet TAKE 1 TABLET(150 MG) BY MOUTH AT BEDTIME 90 tablet 3  . TUBERCULIN SYR 1CC/25GX5/8" (  SAFETY-LOK TB SYR 1CC/25GX5/8") 25G X 5/8" 1 ML MISC USE AS DIRECTED FOR B-12 INJECTIONS. 20 each 3  . valACYclovir (VALTREX) 1000 MG tablet TAKE 2 TABLETS BY MOUTH EVERY 12 HOURS FOR 1 DAY AS NEEDED 20 tablet 3  . Vitamin D, Ergocalciferol, (DRISDOL) 1.25 MG (50000 UNIT) CAPS capsule 1 capsule po q week. 12 capsule 1   No current facility-administered medications for this visit.    Family History  Problem Relation Age of Onset  . COPD Father        chronic lung infections  . Colon polyps Father   . Heart attack Mother   . Osteoporosis Mother   . Lung cancer Mother   . Colon polyps Mother   . Colon cancer Neg Hx   . Esophageal cancer Neg Hx   . Stomach cancer Neg Hx   . Rectal cancer Neg Hx     Review of Systems  Constitutional: Negative.   HENT: Negative.   Eyes:  Negative.   Respiratory: Negative.   Cardiovascular: Negative.   Gastrointestinal: Negative.   Endocrine: Negative.   Genitourinary: Negative.   Musculoskeletal: Negative.   Skin: Negative.   Allergic/Immunologic: Negative.   Neurological: Negative.   Hematological: Negative.   Psychiatric/Behavioral: Negative.     Exam:   BP 114/68   Pulse 68   Resp 16   Ht 4' 11.25" (1.505 m)   Wt 114 lb (51.7 kg)   LMP 08/24/1997   BMI 22.83 kg/m   Height: 4' 11.25" (150.5 cm)  General appearance: alert, cooperative and appears stated age Head: Normocephalic, without obvious abnormality, atraumatic Neck: no adenopathy, supple, symmetrical, trachea midline and thyroid normal to inspection and palpation Lungs: clear to auscultation bilaterally Breasts: normal appearance, no masses or tenderness Heart: regular rate and rhythm Abdomen: soft, non-tender; bowel sounds normal; no masses,  no organomegaly Extremities: extremities normal, atraumatic, no cyanosis or edema Skin: Skin color, texture, turgor normal. No rashes or lesions Lymph nodes: Cervical, supraclavicular, and axillary nodes normal. No abnormal inguinal nodes palpated Neurologic: Grossly normal   Pelvic: External genitalia:  no lesions              Urethra:  normal appearing urethra with no masses, tenderness or lesions              Bartholins and Skenes: normal                 Vagina: patchy erythema at cuff, possible atrophic, pap obtained              Cervix: absent              Pap taken: yes Bimanual Exam:  Uterus:  uterus absent              Adnexa: no mass, fullness, tenderness               Rectovaginal: Confirms               Anus:  normal sphincter tone, no lesions  Chaperone, Kaitlyn Sprague, RN, was present for exam.  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 Tomi Likens, no  additional follow up recommended after 2019  P:   Mammogram guidelines reviewed.  Doing yearly. Pap smear obtained Desires to continue Estratest HS, takes 1/2 tab 3 times weekly.  Rx to pharmacy. Lab work is  done with Dr. Quay Burow, PCP BMD 2018.  Plan to repeat next year. Colonoscopy due 2022. Rf for estratest HS 1/2 tab three times weekly. Return for AEX or prn new issues

## 2020-02-28 DIAGNOSIS — M545 Low back pain: Secondary | ICD-10-CM | POA: Diagnosis not present

## 2020-02-28 DIAGNOSIS — R030 Elevated blood-pressure reading, without diagnosis of hypertension: Secondary | ICD-10-CM | POA: Diagnosis not present

## 2020-02-28 DIAGNOSIS — M5416 Radiculopathy, lumbar region: Secondary | ICD-10-CM | POA: Diagnosis not present

## 2020-03-01 ENCOUNTER — Other Ambulatory Visit: Payer: Self-pay

## 2020-03-01 ENCOUNTER — Other Ambulatory Visit (HOSPITAL_COMMUNITY)
Admission: RE | Admit: 2020-03-01 | Discharge: 2020-03-01 | Disposition: A | Discharge status: Home / Self Care | Payer: Medicare PPO | Source: Ambulatory Visit | Attending: Obstetrics & Gynecology | Admitting: Obstetrics & Gynecology

## 2020-03-01 ENCOUNTER — Encounter: Payer: Self-pay | Admitting: Obstetrics & Gynecology

## 2020-03-01 ENCOUNTER — Ambulatory Visit (INDEPENDENT_AMBULATORY_CARE_PROVIDER_SITE_OTHER): Payer: Medicare PPO | Admitting: Obstetrics & Gynecology

## 2020-03-01 VITALS — BP 114/68 | HR 68 | Resp 16 | Ht 59.25 in | Wt 114.0 lb

## 2020-03-01 DIAGNOSIS — Z01419 Encounter for gynecological examination (general) (routine) without abnormal findings: Secondary | ICD-10-CM

## 2020-03-01 DIAGNOSIS — Z124 Encounter for screening for malignant neoplasm of cervix: Secondary | ICD-10-CM

## 2020-03-01 MED ORDER — EST ESTROGENS-METHYLTEST 0.625-1.25 MG PO TABS: ORAL_TABLET | ORAL | 1 refills | Status: DC

## 2020-03-01 MED ORDER — VITAMIN D (ERGOCALCIFEROL) 1.25 MG (50000 UNIT) PO CAPS: ORAL_CAPSULE | ORAL | 4 refills | Status: DC

## 2020-03-04 ENCOUNTER — Encounter: Payer: Self-pay | Admitting: Internal Medicine

## 2020-03-04 LAB — CYTOLOGY - PAP: Diagnosis: NEGATIVE

## 2020-03-04 MED ORDER — AMOXICILLIN-POT CLAVULANATE 875-125 MG PO TABS: 1.0000 | ORAL_TABLET | Freq: Two times a day (BID) | ORAL | 0 refills | Status: DC

## 2020-03-14 ENCOUNTER — Other Ambulatory Visit: Payer: Self-pay | Admitting: Obstetrics & Gynecology

## 2020-03-18 ENCOUNTER — Other Ambulatory Visit: Payer: Self-pay | Admitting: Internal Medicine

## 2020-03-19 DIAGNOSIS — H2513 Age-related nuclear cataract, bilateral: Secondary | ICD-10-CM | POA: Diagnosis not present

## 2020-03-19 DIAGNOSIS — H353131 Nonexudative age-related macular degeneration, bilateral, early dry stage: Secondary | ICD-10-CM | POA: Diagnosis not present

## 2020-03-21 ENCOUNTER — Ambulatory Visit: Payer: Medicare PPO | Admitting: Physician Assistant

## 2020-03-21 ENCOUNTER — Other Ambulatory Visit: Payer: Self-pay

## 2020-03-21 ENCOUNTER — Encounter: Payer: Self-pay | Admitting: Physician Assistant

## 2020-03-21 DIAGNOSIS — Z1283 Encounter for screening for malignant neoplasm of skin: Secondary | ICD-10-CM | POA: Diagnosis not present

## 2020-03-21 DIAGNOSIS — L814 Other melanin hyperpigmentation: Secondary | ICD-10-CM

## 2020-03-21 MED ORDER — HYDROQUINONE 4 % EX CREA: TOPICAL_CREAM | Freq: Two times a day (BID) | CUTANEOUS | 2 refills | Status: DC

## 2020-03-21 NOTE — Progress Notes (Signed)
   Follow up Visit  Subjective  Jo Gomez is a 74 y.o. female who presents for the following: Annual Exam (No concerns). She has been using an otc fade cream for the dark spots on her face.  Objective  Well appearing patient in no apparent distress; mood and affect are within normal limits.  A full examination was performed including head, eyes, ears, nose, lips, neck, chest, axillae, abdomen, back, buttocks, bilateral upper extremities, bilateral lower extremities, hands, feet, fingers, toes, fingernails, and toenails. All findings within normal limits unless otherwise noted below. No suspicious moles noted on back.   Objective  Mid Back: Full body skin exam  Objective  Head - Anterior (Face): Scattered tan macules.   Assessment & Plan  Screening for malignant neoplasm of skin Mid Back  [place] Head - Anterior (Face)  Ordered Medications: hydroquinone 4 % cream

## 2020-04-08 ENCOUNTER — Other Ambulatory Visit: Payer: Self-pay | Admitting: Internal Medicine

## 2020-04-08 DIAGNOSIS — Z1231 Encounter for screening mammogram for malignant neoplasm of breast: Secondary | ICD-10-CM

## 2020-04-09 ENCOUNTER — Encounter: Payer: Self-pay | Admitting: Internal Medicine

## 2020-04-09 ENCOUNTER — Telehealth: Payer: Self-pay | Admitting: Internal Medicine

## 2020-04-09 DIAGNOSIS — D802 Selective deficiency of immunoglobulin A [IgA]: Secondary | ICD-10-CM

## 2020-04-09 NOTE — Telephone Encounter (Signed)
Message sent via my chart

## 2020-04-09 NOTE — Telephone Encounter (Signed)
This patient sent in a medical question through our contact submission form online:   Subject: Covid booster shot  Dear Dr,  I have had the two moderna injections with the last one in early March. With my IgA deficiency do you recommend I get the booster shot. I did get quite sick from the second injection with fever, aches, headache, weakness and couldn't really get out of bed for days. It was a 7-10 day recovery. I don't look forward to that again unless it is recommended. Although the Covid infection would probably be worse than that of the vaccine.  Thank you, Jo Gomez  Ps.Marland Kitchen You are not on [facility] My[facility] for communication. Perhaps you can have your name added. Thank you!

## 2020-04-14 NOTE — Progress Notes (Signed)
New Patient Note  RE: Jo Gomez MRN: [id number] DOB: June 15, 1946 Date of Office Visit: 04/15/2020  Referring provider: Binnie Rail, MD Primary care provider: Binnie Rail, MD  Chief Complaint: IgA Deficiency  History of Present Illness: I had the pleasure of seeing Jo Gomez for initial evaluation at the [facility] on 04/15/2020. She is a 74 y.o. 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  Patient received both of her Moderna vaccines with second dose administered on 10/27/19. With the second dose patient had fevers, aches and pains for a few days.  Denies any respiratory compromise, itching, hives or swelling.   Patient had Covid spike ab levels drawn on 02/16/2020 which was 912.9.  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 No recent follow up. Patient is not sure if other bloodwork was drawn except for IgA.  Patient has history multiple infections including sinus infection, bronchitis, ear infections, GI infections/diarrhea, viral infections. Denies any skin infections/abscesses. Patient has no history of opportunistic infections including fungal infections.   Patient labs show immunoglobulin levels IgA in 2011 <7. Patient reports 3-4 antibiotic use in the last 12 months and 0 hospital admissions. Patient does not have any secondary causes of immunodeficiency including chronic steroid use, diabetes mellitus, protein losing enteropathy, renal or hepatic dysfunction, history of cancer or irradiation or history of HIV, hepatitis B or C.  Patient was also told that she has low cilia mobility in her sinuses - no report available for review.   Assessment and Plan: Jo Gomez is a 74 y.o. female with: IgA deficiency [facility]) Patient was diagnosed with IgA deficiency at [facility] over 10 years ago. She is not sure if  other immune bloodwork was drawn at that time. Father had IgA deficiency as well. Patient has issues with frequent sinopulmonary infections requiring prolonged courses of antibiotics. 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 Completed both Moderna COVID-19 vaccines and had few days of fevers, aches and pains with the second dose. Covid spike ab on 02/16/2020 was 912.9.  Discussed with patient at length that following Covid spike antibody levels is not a good measure of protection as there have been no studies to correlate it with clinical benefit. However, the fact that she mounted a response is a good sign that she was able to make antibodies.   Recommend getting some additional basic immune bloodwork and depending on those results will decide whether patient should get booster COVID-19 vaccine now or if it's okay to wait.   Her reaction to the vaccine was non-IgE mediated and rather it showed that she had a robust immune response which can produce the fevers, aches and pains she was experiencing.   Keep track of infections.  Usually need at least 14-21 days worth of antibiotics with infections.   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 There is a small risk of allergic reaction.  History of frequent upper respiratory infection  See assessment and plan as above.  Chronic rhinitis Frequent sinus infections requiring antibiotics. No prior environmental allergy testing.  Check environmental allergy panel via bloodwork.  Will make additional recommendations based on results.   Reactive airway disease Respiratory symptoms with URIs mainly for the past 10 years. Uses albuterol in these situations with good benefit.  Today's spirometry was normal.  Most likely has URI induced reactive airway disease.   May use albuterol rescue inhaler 2 puffs every 4 to 6 hours as needed for shortness of breath, chest tightness, coughing, and wheezing. Monitor  frequency of use.   Return in about 3 months (around 07/16/2020).  Lab Orders     CBC with Differential/Platelet     Complement, total     IgG, IgA, IgM     Strep pneumoniae 23 Serotypes IgG     Diphtheria / Tetanus Antibody Panel     Allergens w/Total IgE Area 2  Other allergy screening: Asthma:  She reports symptoms of chest tightness, shortness of breath, coughing, wheezing for 10 years. Current medications include albuterol prn which help. She reports not using aerochamber with inhalers. She tried the following inhalers: Anoro. Main triggers are infections. In the last month, frequency of symptoms: <1x/week. Frequency of nocturnal symptoms: 0x/month. Frequency of SABA use: <1x/week. In the last 12 months, oral steroids courses: none. History of pneumonia: no. She was evaluated by pulmonologist in the past. Smoking exposure: no. Up to date with flu vaccine: no. Up to date with pneumonia vaccine: yes. Up to date with COVID-19 vaccine: yes.  History of reflux: yes.   Rhino conjunctivitis:  Some mild rhinitis symptoms and takes pseudofed prn with some benefit.  No prior environmental allergy testing.  Food allergy: no Medication allergy: yes Hymenoptera allergy:  Large localized reaction - aching and feverish Urticaria: no Eczema:no  Diagnostics: Spirometry:  Tracings reviewed. Her effort: Good reproducible efforts. FVC: 2.17L FEV1 1.67L, 95% predicted FEV1/FVC ratio: 77% Interpretation: Spirometry consistent with normal pattern.  Please see scanned spirometry results for details.  Past Medical History: Patient Active Problem List   Diagnosis Date Noted  . History of frequent upper respiratory infection 04/15/2020  . Chronic rhinitis 04/15/2020  . Chronic bronchitis ([place]) 02/01/2019  . Migraine headache 01/31/2019  . Hyperglycemia 01/30/2019  . Chronic cough 01/02/2019  . Cardiac murmur 09/29/2016  . Pain of both shoulder joints 06/24/2016  . Colloid cyst of third  ventricle (H[facility]) 04/29/2016  . Chronic sinusitis 01/22/2016  . Insomnia 09/16/2015  . Low back pain radiating to left leg 06/25/2014  . 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 Vitamin B12 deficiency 03/05/2010  . Esophageal reflux 03/04/2010  . IgA deficiency ([facility]) 10/18/2009  . Hypothyroidism 09/05/2007  . Allergic rhinitis 09/05/2007  . Depression 10/21/2006  . Reactive airway disease 10/21/2006  . Osteopenia 10/21/2006   Past Medical History:  Diagnosis Date  . Allergy   . Anxiety   . 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 Asthma   . Depression   . GERD (gastroesophageal reflux disease)    diet controlled, no med  . History of endoscopy   . HSV infection   . IgA deficiency ([place])   . Low vitamin B12 level   . Osteoporosis   . Ovarian cyst    TAH/BSO  . Shingles   . Thyroid dysfunction    Past Surgical History: Past Surgical History:  Procedure Laterality Date  . broken arm     surgery for this  . BROW LIFT  1/15  . COLONOSCOPY    . THYROIDECTOMY  2000  . TOTAL ABDOMINAL HYSTERECTOMY W/ BILATERAL SALPINGOOPHORECTOMY  1999   Medication List:  Current Outpatient Medications  Medication Sig Dispense Refill  . calcium-vitamin D (OSCAL WITH D) 500-200 MG-UNIT TABS tablet Take by mouth.    Marland Kitchen  cyanocobalamin (,VITAMIN B-12,) 1000 MCG/ML injection Inject 1 Ml under the skin ever 30 days. 30 mL 0  . estrogen-methylTESTOSTERone (EST ESTROGENS-METHYLTEST HS) 0.625-1.25 MG tablet 1 tab three times weekly. 36 tablet 1  . levothyroxine (SYNTHROID) 75 MCG tablet TAKE 1 TABLET(75 MCG) BY MOUTH DAILY BEFORE BREAKFAST 90 tablet 3  . sertraline (ZOLOFT) 100 MG tablet Take 1.5 tablets (150 mg total) by mouth daily. Annual appt due in June must see provider for future refills 135 tablet 0  . traZODone (DESYREL) 150 MG tablet TAKE 1 TABLET(150 MG) BY MOUTH AT BEDTIME 90 tablet 3  . albuterol (VENTOLIN HFA) 108 (90 Base) MCG/ACT inhaler Inhale 2 puffs into  the lungs every 6 (six) hours as needed for wheezing or shortness of breath. 1 Inhaler 2  . esomeprazole (NEXIUM) 40 MG capsule Take 1 capsule (40 mg total) by mouth daily at 12 noon. (Patient not taking: Reported on 03/21/2020) 90 capsule 3  . hydroquinone 4 % cream Apply topically 2 (two) times daily. 28.35 g 2  . rizatriptan (MAXALT) 10 MG tablet Take 1 tablet (10 mg total) by mouth as needed for migraine. May repeat in 2 hours if needed (Patient not taking: Reported on 03/21/2020) 10 tablet 5  . sodium chloride (OCEAN) 0.65 % SOLN nasal spray Place 1 spray into both nostrils as needed for congestion. 15 mL 0  . TUBERCULIN SYR 1CC/25GX5/8" (SAFETY-LOK TB SYR 1CC/25GX5/8") 25G X 5/8" 1 ML MISC USE AS DIRECTED FOR B-12 INJECTIONS. 20 each 3  . valACYclovir (VALTREX) 1000 MG tablet TAKE 2 TABLETS BY MOUTH EVERY 12 HOURS FOR 1 DAY AS NEEDED 20 tablet 3  . Vitamin D, Ergocalciferol, (DRISDOL) 1.25 MG (50000 UNIT) CAPS capsule 1 capsule every other week. 6 capsule 4   No current facility-administered medications for this visit.   Allergies: Allergies  Allergen Reactions  . Clarithromycin     Severe leg and muscle cramps  . Erythromycin Base Nausea And Vomiting  . Levaquin [Levofloxacin In D5w]     tendonitis  . Codeine Rash    Syrup only, can take hydrocodone, oxycodone  . Other Other (See Comments) and Rash    IGA Deficiency - catches a lot of viruses  . Sulfonamide Derivatives Rash  . Tetracycline Rash   Social History: Social History   Socioeconomic History  . Marital status: Married    Spouse name: [name]  . Number of children: 1  . Years of education: Not on file  . Highest education level: Master's degree (e.g., MA, MS, MEng, MEd, MSW, MBA)  Occupational History    Employer: RETIRED  Tobacco Use  . Smoking status: Former Smoker    Packs/day: 0.50    Years: 3.00    Pack years: 1.50    Types: Cigarettes    Quit date: 08/24/1968    Years since quitting: 51.6  . Smokeless  tobacco: Never Used  . Tobacco comment: in college  Vaping Use  . Vaping Use: Never used  Substance and Sexual Activity  . Alcohol use: Yes    Alcohol/week: 2.0 standard drinks    Types: 2 Standard drinks or equivalent per week  . Drug use: No  . Sexual activity: Yes    Partners: Female    Birth control/protection: Surgical    Comment: TAH/BSO  Other Topics Concern  . Not on file  Social History Narrative   Patient is right-handed. She lives with her wife in a split level house. She occasionally drinks coffee.   Social  Determinants of Health   Financial Resource Strain: Low Risk   . Difficulty of Paying Living Expenses: Not hard at all  Food Insecurity: No Food Insecurity  . Worried About Charity fundraiser in the Last Year: Never true  . Ran Out of Food in the Last Year: Never true  Transportation Needs: No Transportation Needs  . Lack of Transportation (Medical): No  . Lack of Transportation (Non-Medical): No  Physical Activity: Sufficiently Active  . Days of Exercise per Week: 7 days  . Minutes of Exercise per Session: 30 min  Stress: No Stress Concern Present  . Feeling of Stress : Not at all  Social Connections: Unknown  . Frequency of Communication with Friends and Family: More than three times a week  . Frequency of Social Gatherings with Friends and Family: More than three times a week  . Attends Religious Services: Patient refused  . Active Member of Clubs or Organizations: Yes  . Attends Archivist Meetings: More than 4 times per year  . Marital Status: Married   Lives in a [address] year old townhome. Smoking: denies Occupation: retired IT sales professional HistoryFreight forwarder in the house: no Charity fundraiser in the family room: yes Carpet in the bedroom: yes Heating: gas Cooling: central Pet: no  Family History: Family History  Problem Relation Age of Onset  . COPD Father        chronic lung infections  . Colon polyps Father   . Heart attack  Mother   . Osteoporosis Mother   . Lung cancer Mother   . Colon polyps Mother   . Colon cancer Neg Hx   . Esophageal cancer Neg Hx   . Stomach cancer Neg Hx   . Rectal cancer Neg Hx    Problem                               Relation Asthma                                   No  Eczema                                No  Food allergy                          No  Allergic rhino conjunctivitis     No  IgA deficiency   Father   Review of Systems  Constitutional: Negative for appetite change, chills, fever and unexpected weight change.  HENT: Negative for congestion and rhinorrhea.   Eyes: Negative for itching.  Respiratory: Negative for cough, chest tightness, shortness of breath and wheezing.   Cardiovascular: Negative for chest pain.  Gastrointestinal: Negative for abdominal pain.  Genitourinary: Negative for difficulty urinating.  Skin: Negative for rash.  Neurological: Negative for headaches.   Objective: BP 110/60   Pulse 70   Temp 98.4 F (36.9 C) (Temporal)   Resp 16   Ht 4' 11.5" (1.511 m)   Wt 113 lb (51.3 kg)   LMP 08/24/1997   SpO2 97%   BMI 22.44 kg/m  Body mass index is 22.44 kg/m. Physical Exam Vitals and nursing note reviewed.  Constitutional:      Appearance: Normal appearance. She is well-developed.  HENT:  Head: Normocephalic and atraumatic.     Right Ear: External ear normal.     Left Ear: External ear normal.     Nose: Nose normal.     Mouth/Throat:     Mouth: Mucous membranes are moist.     Pharynx: Oropharynx is clear.  Eyes:     Conjunctiva/sclera: Conjunctivae normal.  Cardiovascular:     Rate and Rhythm: Normal rate and regular rhythm.     Heart sounds: Normal heart sounds. No murmur heard.  No friction rub. No gallop.   Pulmonary:     Effort: Pulmonary effort is normal.     Breath sounds: Normal breath sounds. No wheezing, rhonchi or rales.  Musculoskeletal:     Cervical back: Neck supple.  Skin:    General: Skin is warm.      Findings: No rash.  Neurological:     Mental Status: She is alert and oriented to person, place, and time.  Psychiatric:        Behavior: Behavior normal.    The plan was reviewed with the patient/family, and all questions/concerned were addressed.  It was my pleasure to see Jo Gomez today and participate in her care. Please feel free to contact me with any questions or concerns.  Sincerely,  Rexene Alberts, DO Allergy & Immunology  [facility] office: [phone] [facility] office: [place] office: [phone]

## 2020-04-15 ENCOUNTER — Ambulatory Visit: Payer: Medicare PPO | Admitting: Allergy

## 2020-04-15 ENCOUNTER — Other Ambulatory Visit: Payer: Self-pay

## 2020-04-15 ENCOUNTER — Encounter: Payer: Self-pay | Admitting: Allergy

## 2020-04-15 VITALS — BP 110/60 | HR 70 | Temp 98.4°F | Resp 16 | Ht 59.5 in | Wt 113.0 lb

## 2020-04-15 DIAGNOSIS — J4520 Mild intermittent asthma, uncomplicated: Secondary | ICD-10-CM

## 2020-04-15 DIAGNOSIS — J310 Chronic rhinitis: Secondary | ICD-10-CM | POA: Diagnosis not present

## 2020-04-15 DIAGNOSIS — D802 Selective deficiency of immunoglobulin A [IgA]: Secondary | ICD-10-CM | POA: Diagnosis not present

## 2020-04-15 DIAGNOSIS — Z8709 Personal history of other diseases of the respiratory system: Secondary | ICD-10-CM | POA: Insufficient documentation

## 2020-04-15 NOTE — Assessment & Plan Note (Addendum)
Frequent sinus infections requiring antibiotics. No prior environmental allergy testing.  Check environmental allergy panel via bloodwork.  Will make additional recommendations based on results.

## 2020-04-15 NOTE — Patient Instructions (Addendum)
Frequent infections:   Hold off the COVID-19 booster for now until we have further information from the bloodwork.   Keep track of infections.  Usually need at least 14-21 days worth of antibiotics with infections.   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 There is a small risk of allergic reaction . Get bloodwork:  o We are ordering labs, so please allow 1-2 weeks for the results to come back. o With the newly implemented Cures Act, the labs might be visible to you at the same time that they become visible to me. However, I will not address the results until all of the results are back, so please be patient.   Breathing:  Your breathing test was normal today.   May use albuterol rescue inhaler 2 puffs every 4 to 6 hours as needed for shortness of breath, chest tightness, coughing, and wheezing. Monitor frequency of use.  Follow up in 3 months or sooner if needed.

## 2020-04-15 NOTE — Assessment & Plan Note (Signed)
Patient was diagnosed with IgA deficiency at [facility] over 10 years ago. She is not sure if other immune bloodwork was drawn at that time. Father had IgA deficiency as well. Patient has issues with frequent sinopulmonary infections requiring prolonged courses of antibiotics. 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 Completed both Moderna COVID-19 vaccines and had few days of fevers, aches and pains with the second dose. Covid spike ab on 02/16/2020 was 912.9.  Discussed with patient at length that following Covid spike antibody levels is not a good measure of protection as there have been no studies to correlate it with clinical benefit. However, the fact that she mounted a response is a good sign that she was able to make antibodies.   Recommend getting some additional basic immune bloodwork and depending on those results will decide whether patient should get booster COVID-19 vaccine now or if it's okay to wait.   Her reaction to the vaccine was non-IgE mediated and rather it showed that she had a robust immune response which can produce the fevers, aches and pains she was experiencing.   Keep track of infections.  Usually need at least 14-21 days worth of antibiotics with infections.   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 There is a small risk of allergic reaction.

## 2020-04-15 NOTE — Assessment & Plan Note (Signed)
Respiratory symptoms with URIs mainly for the past 10 years. Uses albuterol in these situations with good benefit.   Today's spirometry was normal.  Most likely has URI induced reactive airway disease.   May use albuterol rescue inhaler 2 puffs every 4 to 6 hours as needed for shortness of breath, chest tightness, coughing, and wheezing. Monitor frequency of use.

## 2020-04-15 NOTE — Assessment & Plan Note (Signed)
.   See assessment and plan as above. 

## 2020-04-19 ENCOUNTER — Other Ambulatory Visit: Payer: Self-pay

## 2020-04-19 ENCOUNTER — Ambulatory Visit
Admission: RE | Admit: 2020-04-19 | Discharge: 2020-04-19 | Disposition: A | Discharge status: Home / Self Care | Payer: Medicare PPO | Source: Ambulatory Visit

## 2020-04-19 DIAGNOSIS — Z1231 Encounter for screening mammogram for malignant neoplasm of breast: Secondary | ICD-10-CM | POA: Diagnosis not present

## 2020-04-23 ENCOUNTER — Encounter: Payer: Self-pay | Admitting: Allergy

## 2020-04-23 ENCOUNTER — Encounter: Payer: Self-pay | Admitting: Internal Medicine

## 2020-04-23 LAB — ALLERGENS W/TOTAL IGE AREA 2

## 2020-04-23 LAB — STREP PNEUMONIAE 23 SEROTYPES IGG
Pneumo Ab Type 1*: 2.1 ug/mL (ref >1.3)
Pneumo Ab Type 12 (12F)*: 1 ug/mL — ABNORMAL LOW (ref >1.3)
Pneumo Ab Type 14*: 13.3 ug/mL (ref >1.3)
Pneumo Ab Type 17 (17F)*: 2.4 ug/mL (ref >1.3)
Pneumo Ab Type 19 (19F)*: 1.8 ug/mL (ref >1.3)
Pneumo Ab Type 2*: 2.7 ug/mL (ref >1.3)
Pneumo Ab Type 20*: 2.7 ug/mL (ref >1.3)
Pneumo Ab Type 22 (22F)*: 1 ug/mL — ABNORMAL LOW (ref >1.3)
Pneumo Ab Type 23 (23F)*: 1.3 ug/mL — ABNORMAL LOW (ref >1.3)
Pneumo Ab Type 26 (6B)*: 0.8 ug/mL — ABNORMAL LOW (ref >1.3)
Pneumo Ab Type 3*: 6.9 ug/mL (ref >1.3)
Pneumo Ab Type 34 (10A)*: 0.7 ug/mL — ABNORMAL LOW (ref >1.3)
Pneumo Ab Type 4*: 1.7 ug/mL (ref >1.3)
Pneumo Ab Type 43 (11A)*: 1.8 ug/mL (ref >1.3)
Pneumo Ab Type 5*: 1.9 ug/mL (ref >1.3)
Pneumo Ab Type 51 (7F)*: 19.8 ug/mL (ref >1.3)
Pneumo Ab Type 54 (15B)*: 1.4 ug/mL (ref >1.3)
Pneumo Ab Type 56 (18C)*: 0.2 ug/mL — ABNORMAL LOW (ref >1.3)
Pneumo Ab Type 57 (19A)*: 13.7 ug/mL (ref >1.3)
Pneumo Ab Type 68 (9V)*: 1.4 ug/mL (ref >1.3)
Pneumo Ab Type 70 (33F)*: 0.3 ug/mL — ABNORMAL LOW (ref >1.3)
Pneumo Ab Type 8*: 3.6 ug/mL (ref >1.3)
Pneumo Ab Type 9 (9N)*: 4.9 ug/mL (ref >1.3)

## 2020-04-23 LAB — DIPHTHERIA / TETANUS ANTIBODY PANEL
Diphtheria Ab: <0.1 IU/mL — ABNORMAL LOW (ref <0.10)
Tetanus Ab, IgG: 1.58 IU/mL (ref <0.10)

## 2020-04-23 LAB — CBC WITH DIFFERENTIAL/PLATELET
Basophils Absolute: 0 10*3/uL (ref 0.0–0.2)
Basos: 1 %
EOS (ABSOLUTE): 0.2 10*3/uL (ref 0.0–0.4)
Eos: 3 %
Hematocrit: 42.4 % (ref 34.0–46.6)
Hemoglobin: 13.5 g/dL (ref 11.1–15.9)
Immature Grans (Abs): 0 10*3/uL (ref 0.0–0.1)
Immature Granulocytes: 1 %
Lymphocytes Absolute: 1.1 10*3/uL (ref 0.7–3.1)
Lymphs: 18 %
MCH: 30.1 pg (ref 26.6–33.0)
MCHC: 31.8 g/dL (ref 31.5–35.7)
MCV: 94 fL (ref 79–97)
Monocytes Absolute: 0.5 10*3/uL (ref 0.1–0.9)
Monocytes: 8 %
Neutrophils Absolute: 4.2 10*3/uL (ref 1.4–7.0)
Neutrophils: 69 %
Platelets: 242 10*3/uL (ref 150–450)
RBC: 4.49 x10E6/uL (ref 3.77–5.28)
RDW: 13.2 % (ref 11.7–15.4)
WBC: 5.9 10*3/uL (ref 3.4–10.8)

## 2020-04-23 LAB — IGG, IGA, IGM
IgA/Immunoglobulin A, Serum: <5 mg/dL — ABNORMAL LOW (ref 64–422)
IgG (Immunoglobin G), Serum: 908 mg/dL (ref 586–1602)
IgM (Immunoglobulin M), Srm: 142 mg/dL (ref 26–217)

## 2020-04-23 LAB — COMPLEMENT, TOTAL: Compl, Total (CH50): >60 U/mL (ref >41)

## 2020-05-23 ENCOUNTER — Other Ambulatory Visit: Payer: Self-pay | Admitting: Internal Medicine

## 2020-06-14 ENCOUNTER — Encounter: Payer: Self-pay | Admitting: Gastroenterology

## 2020-06-14 ENCOUNTER — Ambulatory Visit: Payer: Medicare PPO | Admitting: Gastroenterology

## 2020-06-14 ENCOUNTER — Other Ambulatory Visit (INDEPENDENT_AMBULATORY_CARE_PROVIDER_SITE_OTHER): Payer: Medicare PPO

## 2020-06-14 VITALS — BP 100/64 | HR 73 | Ht 59.0 in | Wt 110.4 lb

## 2020-06-14 DIAGNOSIS — K219 Gastro-esophageal reflux disease without esophagitis: Secondary | ICD-10-CM

## 2020-06-14 DIAGNOSIS — R079 Chest pain, unspecified: Secondary | ICD-10-CM

## 2020-06-14 DIAGNOSIS — R634 Abnormal weight loss: Secondary | ICD-10-CM

## 2020-06-14 LAB — CBC WITH DIFFERENTIAL/PLATELET
Basophils Absolute: 0 10*3/uL (ref 0.0–0.1)
Basophils Relative: 0.3 % (ref 0.0–3.0)
Eosinophils Absolute: 0.2 10*3/uL (ref 0.0–0.7)
Eosinophils Relative: 2.2 % (ref 0.0–5.0)
HCT: 40.2 % (ref 36.0–46.0)
Hemoglobin: 13.5 g/dL (ref 12.0–15.0)
Lymphocytes Relative: 15.4 % (ref 12.0–46.0)
Lymphs Abs: 1.1 10*3/uL (ref 0.7–4.0)
MCHC: 33.5 g/dL (ref 30.0–36.0)
MCV: 93.1 fl (ref 78.0–100.0)
Monocytes Absolute: 0.9 10*3/uL (ref 0.1–1.0)
Monocytes Relative: 12.5 % — ABNORMAL HIGH (ref 3.0–12.0)
Neutro Abs: 4.9 10*3/uL (ref 1.4–7.7)
Neutrophils Relative %: 69.6 % (ref 43.0–77.0)
Platelets: 246 10*3/uL (ref 150.0–400.0)
RBC: 4.32 Mil/uL (ref 3.87–5.11)
RDW: 13.4 % (ref 11.5–15.5)
WBC: 7.1 10*3/uL (ref 4.0–10.5)

## 2020-06-14 LAB — COMPREHENSIVE METABOLIC PANEL
ALT: 47 U/L — ABNORMAL HIGH (ref 0–35)
AST: 43 U/L — ABNORMAL HIGH (ref 0–37)
Albumin: 4.4 g/dL (ref 3.5–5.2)
Alkaline Phosphatase: 76 U/L (ref 39–117)
BUN: 22 mg/dL (ref 6–23)
CO2: 30 mEq/L (ref 19–32)
Calcium: 9.7 mg/dL (ref 8.4–10.5)
Chloride: 101 mEq/L (ref 96–112)
Creatinine, Ser: 0.95 mg/dL (ref 0.40–1.20)
GFR: 59.94 mL/min — ABNORMAL LOW (ref >60.00)
Glucose, Bld: 79 mg/dL (ref 70–99)
Potassium: 4.3 mEq/L (ref 3.5–5.1)
Sodium: 139 mEq/L (ref 135–145)
Total Bilirubin: 0.4 mg/dL (ref 0.2–1.2)
Total Protein: 6.8 g/dL (ref 6.0–8.3)

## 2020-06-14 LAB — TSH: TSH: 0.91 u[IU]/mL (ref 0.35–4.50)

## 2020-06-14 LAB — LIPASE: Lipase: 44 U/L (ref 11.0–59.0)

## 2020-06-14 NOTE — Patient Instructions (Signed)
Take your Nexium 40 mg every morning and take Pepcid 40 mg at bedtime.   Take over the counter Tylenol three times a day x 10 days. If you do not see improvement in your musculoskeletal pain then switch to ibuprofen x 10 days.   You have been scheduled for an endoscopy. Please follow written instructions given to you at your visit today. If you use inhalers (even only as needed), please bring them with you on the day of your procedure.   Thank you for choosing me and [facility].  Pricilla Riffle. Dagoberto Ligas., MD., [facility]

## 2020-06-14 NOTE — Progress Notes (Signed)
    History of Present Illness: This is a64 year old female with chest pain, nausea and weight loss.  She describes a lower mid anterior chest pain that has bothered her for several weeks it is exacerbated by movement and bending.  She states it is uncomfortable for her bra to be tight around this area and she has loosened her bra.  She has tried Nexium and Pepcid without relief.  She has modified her diet to bland foods over the past several weeks and with this diet she notes a 10 pound weight loss.  She has frequent nausea and belching.  She was previously treated with Nexium 40 mg po qam, Pecid 40 mg qhs and Carafate suspension 1 g qid in 07/2019 which was effective for GERD symptoms. Denies abdominal pain, constipation, diarrhea, change in stool caliber, melena, hematochezia, nausea, vomiting, dysphagia.   Current Medications, Allergies, Past Medical History, Past Surgical History, Family History and Social History were reviewed in [employer] record.   Physical Exam: General: Well developed, well nourished, no acute distress Head: Normocephalic and atraumatic Eyes:  sclerae anicteric, EOMI Ears: Normal auditory acuity Mouth: Not examined, mask on during Covid-19 pandemic Lungs: Clear throughout to auscultation Chest: Tenderness to palpation lower sternum Heart: Regular rate and rhythm; no murmurs, rubs or bruits Abdomen: Soft, non tender and non distended. No masses, hepatosplenomegaly or hernias noted. Normal Bowel sounds Rectal: Not done Musculoskeletal: Symmetrical with no gross deformities  Pulses:  Normal pulses noted Extremities: No clubbing, cyanosis, edema or deformities noted Neurological: Alert oriented x 4, grossly nonfocal Psychological:  Alert and cooperative. Normal mood and affect   Assessment and Recommendations:  1. Chest pain.  Lower sternal tenderness on exam. Suspected costochondritis.  Tylenol 1 g 3 times daily for 5 to 10 days.  If  symptoms not improved switch to ibuprofen 400 mg 3 times daily for 5 to 10 days.  If symptoms persist further follow-up and evaluation with PCP.  2. GERD. Weight loss likely from diet changes.  Rule out ulcer, gastritis, duodenitis, esophagitis or neoplasm.  CMP, CBC, lipase, TSH today.  Follow antireflux measures.  Liberalize diet restrictions to stabilize weight.  Her diet restrictions have not improved her symptoms.  Nexium 40 mg p.o. every morning and Pepcid 40 mg p.o. at bedtime.  Schedule EGD. The risks (including bleeding, perforation, infection, missed lesions, medication reactions and possible hospitalization or surgery if complications occur), benefits, and alternatives to endoscopy with possible biopsy and possible dilation were discussed with the patient and they consent to proceed.   3. Family history of colon polyps.  A 5-year interval surveillance colonoscopy is recommended in November 2022.

## 2020-06-17 ENCOUNTER — Encounter: Payer: Self-pay | Admitting: Gastroenterology

## 2020-06-17 ENCOUNTER — Other Ambulatory Visit: Payer: Self-pay

## 2020-06-17 ENCOUNTER — Encounter: Payer: Self-pay | Admitting: Internal Medicine

## 2020-06-17 ENCOUNTER — Ambulatory Visit (AMBULATORY_SURGERY_CENTER): Payer: Medicare PPO | Admitting: Gastroenterology

## 2020-06-17 VITALS — BP 118/62 | HR 61 | Temp 97.3°F | Resp 13 | Ht 59.0 in | Wt 110.0 lb

## 2020-06-17 DIAGNOSIS — K449 Diaphragmatic hernia without obstruction or gangrene: Secondary | ICD-10-CM | POA: Diagnosis not present

## 2020-06-17 DIAGNOSIS — J45909 Unspecified asthma, uncomplicated: Secondary | ICD-10-CM | POA: Diagnosis not present

## 2020-06-17 DIAGNOSIS — K317 Polyp of stomach and duodenum: Secondary | ICD-10-CM | POA: Diagnosis not present

## 2020-06-17 DIAGNOSIS — R079 Chest pain, unspecified: Secondary | ICD-10-CM

## 2020-06-17 DIAGNOSIS — K219 Gastro-esophageal reflux disease without esophagitis: Secondary | ICD-10-CM | POA: Diagnosis not present

## 2020-06-17 DIAGNOSIS — R634 Abnormal weight loss: Secondary | ICD-10-CM

## 2020-06-17 DIAGNOSIS — R7401 Elevation of levels of liver transaminase levels: Secondary | ICD-10-CM

## 2020-06-17 DIAGNOSIS — R0789 Other chest pain: Secondary | ICD-10-CM | POA: Diagnosis not present

## 2020-06-17 MED ORDER — SODIUM CHLORIDE 0.9 % IV SOLN: 500.0000 mL | Freq: Once | INTRAVENOUS | Status: DC

## 2020-06-17 NOTE — Progress Notes (Signed)
Report to PACU, RN, vss, BBS= Clear.  

## 2020-06-17 NOTE — Progress Notes (Signed)
Vs by CW in admitting  Reviewed meds and med hx  & pt stated no changes since in office 06/14/20

## 2020-06-17 NOTE — Op Note (Signed)
[facility] Patient Name: Jo Gomez Procedure Date: 06/17/2020 10:59 AM MRN: [id number] Endoscopist: Ladene Artist , MD Age: 74 Referring MD:  Date of Birth: [birth year] Gender: Female Account #: [id number] Procedure:                Upper GI endoscopy Indications:              Gastroesophageal reflux disease, Unexplained chest                            pain, Weight loss Procedure:                Pre-Anesthesia Assessment:                           - Prior to the procedure, a History and Physical                            was performed, and patient medications and                            allergies were reviewed. The patient's tolerance of                            previous anesthesia was also reviewed. The risks                            and benefits of the procedure and the sedation                            options and risks were discussed with the patient.                            All questions were answered, and informed consent                            was obtained. Prior Anticoagulants: The patient has                            taken no previous anticoagulant or antiplatelet                            agents. ASA Grade Assessment: II - A patient with                            mild systemic disease. After reviewing the risks                            and benefits, the patient was deemed in                            satisfactory condition to undergo the procedure.                           After obtaining informed consent, the endoscope was  passed under direct vision. Throughout the                            procedure, the patient's blood pressure, pulse, and                            oxygen saturations were monitored continuously. The                            Endoscope was introduced through the mouth, and                            advanced to the second part of duodenum. The upper                            GI  endoscopy was accomplished without difficulty.                            The patient tolerated the procedure well. Scope In: Scope Out: Findings:                 The examined esophagus was normal.                           A small hiatal hernia was present.                           Multiple small sessile polyps with no bleeding and                            no stigmata of recent bleeding were found in the                            gastric fundus and in the gastric body. Biopsies                            were taken with a cold forceps for histology.                           The exam of the stomach was otherwise normal.                           The duodenal bulb and second portion of the                            duodenum were normal. Complications:            No immediate complications. Estimated Blood Loss:     Estimated blood loss was minimal. Impression:               - Normal esophagus.                           - Small hiatal hernia.                           -  Multiple gastric polyps. Biopsied.                           - Normal duodenal bulb and second portion of the                            duodenum. Recommendation:           - Patient has a contact number available for                            emergencies. The signs and symptoms of potential                            delayed complications were discussed with the                            patient. Return to normal activities tomorrow.                            Written discharge instructions were provided to the                            patient.                           - Resume previous diet.                           - Antireflux measures.                           - Continue present medications.                           - Await pathology results. Ladene Artist, MD 06/17/2020 11:08:21 AM This report has been signed electronically.

## 2020-06-17 NOTE — Progress Notes (Signed)
Called to room to assist during endoscopic procedure.  Patient ID and intended procedure confirmed with present staff. Received instructions for my participation in the procedure from the performing physician.  

## 2020-06-17 NOTE — Patient Instructions (Signed)
Handout given for Hiatal Hernia, follow anti-reflux measures.  Await pathology results.   YOU HAD AN ENDOSCOPIC PROCEDURE TODAY AT [facility]:   Refer to the procedure report that was given to you for any specific questions about what was found during the examination.  If the procedure report does not answer your questions, please call your gastroenterologist to clarify.  If you requested that your care partner not be given the details of your procedure findings, then the procedure report has been included in a sealed envelope for you to review at your convenience later.  YOU SHOULD EXPECT: Some feelings of bloating in the abdomen. Passage of more gas than usual.  Walking can help get rid of the air that was put into your GI tract during the procedure and reduce the bloating. If you had a lower endoscopy (such as a colonoscopy or flexible sigmoidoscopy) you may notice spotting of blood in your stool or on the toilet paper. If you underwent a bowel prep for your procedure, you may not have a normal bowel movement for a few days.  Please Note:  You might notice some irritation and congestion in your nose or some drainage.  This is from the oxygen used during your procedure.  There is no need for concern and it should clear up in a day or so.  SYMPTOMS TO REPORT IMMEDIATELY:    Following upper endoscopy (EGD)  Vomiting of blood or coffee ground material  New chest pain or pain under the shoulder blades  Painful or persistently difficult swallowing  New shortness of breath  Fever of 100F or higher  Black, tarry-looking stools  For urgent or emergent issues, a gastroenterologist can be reached at any hour by calling [phone]. Do not use MyChart messaging for urgent concerns.    DIET:  We do recommend a small meal at first, but then you may proceed to your regular diet.  Drink plenty of fluids but you should avoid alcoholic beverages for 24 hours.  ACTIVITY:  You  should plan to take it easy for the rest of today and you should NOT DRIVE or use heavy machinery until tomorrow (because of the sedation medicines used during the test).    FOLLOW UP: Our staff will call the number listed on your records 48-72 hours following your procedure to check on you and address any questions or concerns that you may have regarding the information given to you following your procedure. If we do not reach you, we will leave a message.  We will attempt to reach you two times.  During this call, we will ask if you have developed any symptoms of COVID 19. If you develop any symptoms (ie: fever, flu-like symptoms, shortness of breath, cough etc.) before then, please call [phone].  If you test positive for Covid 19 in the 2 weeks post procedure, please call and report this information to [place].    If any biopsies were taken you will be contacted by phone or by letter within the next 1-3 weeks.  Please call us at [phone] if you have not heard about the biopsies in 3 weeks.    SIGNATURES/CONFIDENTIALITY: You and/or your care partner have signed paperwork which will be entered into your electronic medical record.  These signatures attest to the fact that that the information above on your After Visit Summary has been reviewed and is understood.  Full responsibility of the confidentiality of this discharge information lies with you and/or your  care-partner.

## 2020-06-19 ENCOUNTER — Telehealth: Payer: Self-pay

## 2020-06-19 NOTE — Telephone Encounter (Signed)
°  Follow up Call-  Call back number 06/17/2020  Post procedure Call Back phone  # [phone]  Permission to leave phone message Yes  Some recent data might be hidden     Patient questions:  Do you have a fever, pain , or abdominal swelling? No. Pain Score  0 *  Have you tolerated food without any problems? Yes.    Have you been able to return to your normal activities? Yes.    Do you have any questions about your discharge instructions: Diet   No. Medications  No. Follow up visit  No.  Do you have questions or concerns about your Care? No.  Actions: * If pain score is 4 or above: 1. No action needed, pain <4.Have you developed a fever since your procedure? no  2.   Have you had an respiratory symptoms (SOB or cough) since your procedure? no  3.   Have you tested positive for COVID 19 since your procedure no  4.   Have you had any family members/close contacts diagnosed with the COVID 19 since your procedure?  no   If yes to any of these questions please route to Joylene John, RN and Joella Prince, RN

## 2020-06-24 ENCOUNTER — Other Ambulatory Visit: Payer: Self-pay | Admitting: Pulmonary Disease

## 2020-06-25 ENCOUNTER — Encounter: Payer: Self-pay | Admitting: Gastroenterology

## 2020-06-27 ENCOUNTER — Ambulatory Visit (HOSPITAL_COMMUNITY)
Admission: RE | Admit: 2020-06-27 | Discharge: 2020-06-27 | Disposition: A | Discharge status: Home / Self Care | Payer: Medicare PPO | Source: Ambulatory Visit | Attending: Gastroenterology | Admitting: Gastroenterology

## 2020-06-27 ENCOUNTER — Other Ambulatory Visit: Payer: Self-pay

## 2020-06-27 DIAGNOSIS — R7401 Elevation of levels of liver transaminase levels: Secondary | ICD-10-CM

## 2020-06-27 DIAGNOSIS — R7989 Other specified abnormal findings of blood chemistry: Secondary | ICD-10-CM | POA: Diagnosis not present

## 2020-06-28 ENCOUNTER — Other Ambulatory Visit: Payer: Self-pay

## 2020-06-28 DIAGNOSIS — R7401 Elevation of levels of liver transaminase levels: Secondary | ICD-10-CM

## 2020-07-01 NOTE — Progress Notes (Signed)
Subjective:    Patient ID: Jo Gomez, female    DOB: [birth year], 74 y.o.   MRN: [id number]  HPI The patient is here for an acute visit.   Upper abdominal pain and nausea -  Several months of pain and nausea in upper epigastric region and it extends straight through to her back. She is belching and burping.  Worse at the end of the day.  Eating makes it worse especially certain foods.  She also has tenderness in her RUQ and on her right side.  She has been taking nexium daily - it may have helped a little.  She is also taking pepcid 40 mg daily.    06/17/20 EGD - small hiatal hernia, gastric polyps, normal esophagus, duodenum  [place] of Abdomen:   No gallstones or wall thickening.    Reviewed Dr Lynne Leader note.   Medications and allergies reviewed with patient and updated if appropriate.  Patient Active Problem List   Diagnosis Date Noted  . RUQ pain 07/02/2020  . History of frequent upper respiratory infection 04/15/2020  . Chronic rhinitis 04/15/2020  . Chronic bronchitis ([place]) 02/01/2019  . Migraine headache 01/31/2019  . Hyperglycemia 01/30/2019  . Chronic cough 01/02/2019  . Cardiac murmur 09/29/2016  . Pain of both shoulder joints 06/24/2016  . 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 Chronic sinusitis 01/22/2016  . Insomnia 09/16/2015  . Low back pain radiating to left leg 06/25/2014  . 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 Vitamin B12 deficiency 03/05/2010  . Esophageal reflux 03/04/2010  . 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 Hypothyroidism 09/05/2007  . Allergic rhinitis 09/05/2007  . Depression 10/21/2006  . Reactive airway disease 10/21/2006  . Osteopenia 10/21/2006    Current Outpatient Medications on File Prior to Visit  Medication Sig Dispense Refill  . calcium-vitamin D (OSCAL WITH D) 500-200 MG-UNIT TABS tablet Take by mouth.    . cyanocobalamin (,VITAMIN B-12,) 1000 MCG/ML injection Inject 1 Ml under the skin ever  30 days. 30 mL 0  . esomeprazole (NEXIUM) 40 MG capsule Take 1 capsule (40 mg total) by mouth daily at 12 noon. 90 capsule 3  . estrogen-methylTESTOSTERone (EST ESTROGENS-METHYLTEST HS) 0.625-1.25 MG tablet 1 tab three times weekly. 36 tablet 1  . hydroquinone 4 % cream Apply topically 2 (two) times daily. 28.35 g 2  . levothyroxine (SYNTHROID) 75 MCG tablet TAKE 1 TABLET(75 MCG) BY MOUTH DAILY BEFORE BREAKFAST 90 tablet 3  . rizatriptan (MAXALT) 10 MG tablet Take 1 tablet (10 mg total) by mouth as needed for migraine. May repeat in 2 hours if needed 10 tablet 5  . sertraline (ZOLOFT) 100 MG tablet Take 1.5 tablets (150 mg total) by mouth daily. Annual appt due in June must see provider for future refills (Patient taking differently: Take 150 mg by mouth 2 (two) times daily. Patient reports she takes medication BID) 135 tablet 0  . sodium chloride (OCEAN) 0.65 % SOLN nasal spray Place 1 spray into both nostrils as needed for congestion. 15 mL 0  . traZODone (DESYREL) 150 MG tablet TAKE 1 TABLET(150 MG) BY MOUTH AT BEDTIME 90 tablet 3  . TUBERCULIN SYR 1CC/25GX5/8" (SAFETY-LOK TB SYR 1CC/25GX5/8") 25G X 5/8" 1 ML MISC USE AS DIRECTED FOR B-12 INJECTIONS. 20 each 3  . valACYclovir (VALTREX) 1000 MG tablet TAKE 2 TABLETS BY MOUTH EVERY 12 HOURS FOR 1 DAY AS NEEDED 20 tablet 3  . Vitamin D, Ergocalciferol, (DRISDOL) 1.25 MG (  50000 UNIT) CAPS capsule 1 capsule every other week. 6 capsule 4   No current facility-administered medications on file prior to visit.    Past Medical History:  Diagnosis Date  . Allergy   . Anxiety   . 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 Asthma   . Depression   . GERD (gastroesophageal reflux disease)    diet controlled, no med  . History of endoscopy   . HSV infection   . 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 Low vitamin B12 level   . Osteoporosis   . Ovarian cyst    TAH/BSO  . Shingles   . Thyroid dysfunction     Past Surgical History:  Procedure Laterality  Date  . broken arm     surgery for this  . BROW LIFT  1/15  . COLONOSCOPY    . THYROIDECTOMY  2000  . TOTAL ABDOMINAL HYSTERECTOMY W/ BILATERAL SALPINGOOPHORECTOMY  1999    Social History   Socioeconomic History  . Marital status: Married    Spouse name: [name]  . Number of children: 1  . Years of education: Not on file  . Highest education level: Master's degree (e.g., MA, MS, MEng, MEd, MSW, MBA)  Occupational History    Employer: RETIRED  Tobacco Use  . Smoking status: Former Smoker    Packs/day: 0.50    Years: 3.00    Pack years: 1.50    Types: Cigarettes    Quit date: 08/24/1968    Years since quitting: 51.8  . Smokeless tobacco: Never Used  . Tobacco comment: in college  Vaping Use  . Vaping Use: Never used  Substance and Sexual Activity  . Alcohol use: Yes    Alcohol/week: 2.0 standard drinks    Types: 2 Standard drinks or equivalent per week  . Drug use: No  . Sexual activity: Yes    Partners: Female    Birth control/protection: Surgical    Comment: TAH/BSO  Other Topics Concern  . Not on file  Social History Narrative   Patient is right-handed. She lives with her wife in a split level house. She occasionally drinks coffee.   Social Determinants of Health   Financial Resource Strain: Low Risk   . Difficulty of Paying Living Expenses: Not hard at all  Food Insecurity: No Food Insecurity  . Worried About Charity fundraiser in the Last Year: Never true  . Ran Out of Food in the Last Year: Never true  Transportation Needs: No Transportation Needs  . Lack of Transportation (Medical): No  . Lack of Transportation (Non-Medical): No  Physical Activity: Sufficiently Active  . Days of Exercise per Week: 7 days  . Minutes of Exercise per Session: 30 min  Stress: No Stress Concern Present  . Feeling of Stress : Not at all  Social Connections: Unknown  . Frequency of Communication with Friends and Family: More than three times a week  . Frequency of Social  Gatherings with Friends and Family: More than three times a week  . Attends Religious Services: Patient refused  . Active Member of Clubs or Organizations: Yes  . Attends Archivist Meetings: More than 4 times per year  . Marital Status: Married    Family History  Problem Relation Age of Onset  . COPD Father        chronic lung infections  . Colon polyps Father   . Heart attack Mother   . Osteoporosis Mother   . Lung  cancer Mother   . Colon polyps Mother   . Colon cancer Neg Hx   . Esophageal cancer Neg Hx   . Stomach cancer Neg Hx   . Rectal cancer Neg Hx     Review of Systems  Constitutional: Negative for fever.  Respiratory: Positive for cough and shortness of breath. Negative for wheezing.   Cardiovascular: Negative for chest pain and palpitations.  Gastrointestinal: Positive for abdominal pain and nausea.       Objective:   Vitals:   07/02/20 1428  BP: 118/62  Pulse: 78  Temp: 98 F (36.7 C)  SpO2: 97%   BP Readings from Last 3 Encounters:  07/02/20 118/62  06/17/20 118/62  06/14/20 100/64   Wt Readings from Last 3 Encounters:  07/02/20 112 lb 12.8 oz (51.2 kg)  06/17/20 110 lb (49.9 kg)  06/14/20 110 lb 6 oz (50.1 kg)   Body mass index is 22.78 kg/m.   Physical Exam Constitutional:      General: She is not in acute distress.    Appearance: She is well-developed. She is not ill-appearing.  HENT:     Head: Normocephalic and atraumatic.  Cardiovascular:     Rate and Rhythm: Normal rate and regular rhythm.  Pulmonary:     Effort: Pulmonary effort is normal. No respiratory distress.     Breath sounds: No wheezing or rales.  Abdominal:     Tenderness: There is abdominal tenderness in the right upper quadrant and epigastric area. There is no right CVA tenderness, left CVA tenderness, guarding or rebound.  Skin:    General: Skin is warm and dry.  Neurological:     Mental Status: She is alert.         US Abdomen Limited RUQ  (LIVER/GB) CLINICAL DATA:  Elevated LFTs  EXAM: ULTRASOUND ABDOMEN LIMITED RIGHT UPPER QUADRANT  COMPARISON:  Abdominal ultrasound 03/06/2010  FINDINGS: Gallbladder:  No gallstones or wall thickening visualized. No sonographic Murphy sign noted by sonographer.  Common bile duct:  Diameter: 0.4 cm, within normal limits  Liver:  No focal lesion identified. Within normal limits in parenchymal echogenicity. Portal vein is patent on color Doppler imaging with normal direction of blood flow towards the liver.  Other: None.  IMPRESSION: No sonographic finding to explain the patient's abnormal liver enzymes.  Electronically Signed   By: Audie Pinto M.D.   On: 06/27/2020 15:38     Assessment & Plan:    See Problem List for Assessment and Plan of chronic medical problems.    This visit occurred during the SARS-CoV-2 public health emergency.  Safety protocols were in place, including screening questions prior to the visit, additional usage of staff PPE, and extensive cleaning of exam room while observing appropriate contact time as indicated for disinfecting solutions.

## 2020-07-02 ENCOUNTER — Other Ambulatory Visit: Payer: Self-pay

## 2020-07-02 ENCOUNTER — Ambulatory Visit: Payer: Medicare PPO | Admitting: Internal Medicine

## 2020-07-02 ENCOUNTER — Encounter: Payer: Self-pay | Admitting: Internal Medicine

## 2020-07-02 VITALS — BP 118/62 | HR 78 | Temp 98.0°F | Ht 59.0 in | Wt 112.8 lb

## 2020-07-02 DIAGNOSIS — R1013 Epigastric pain: Secondary | ICD-10-CM | POA: Diagnosis not present

## 2020-07-02 DIAGNOSIS — R350 Frequency of micturition: Secondary | ICD-10-CM | POA: Diagnosis not present

## 2020-07-02 DIAGNOSIS — R1011 Right upper quadrant pain: Secondary | ICD-10-CM | POA: Diagnosis not present

## 2020-07-02 NOTE — Assessment & Plan Note (Signed)
Chronic, intermittent Worse with food and at end of day Lateral RUQ minimal tenderness on exam Abd [place] normal, ? decreased GB function  Will get HIDA

## 2020-07-02 NOTE — Patient Instructions (Signed)
An EKG was done today  A HIDA scan was ordered.

## 2020-07-02 NOTE — Assessment & Plan Note (Signed)
Acute Some frequency with RUQ pain Urine dip negative

## 2020-07-02 NOTE — Assessment & Plan Note (Signed)
Acute Epigastric pain - worse with food and at end of day  - sounds GI But has had some pain with gardening and activity - less likely cardiac but will check EKG  EKG NSR w/o acute abnormality, normal EKG - no change compared to prior EKG

## 2020-07-03 NOTE — Addendum Note (Signed)
Addended by: Marcina Millard on: 07/03/2020 11:43 AM   Modules accepted: Orders

## 2020-07-15 ENCOUNTER — Ambulatory Visit (HOSPITAL_COMMUNITY): Payer: Medicare PPO

## 2020-07-22 ENCOUNTER — Encounter: Payer: Self-pay | Admitting: Allergy

## 2020-07-22 ENCOUNTER — Ambulatory Visit (INDEPENDENT_AMBULATORY_CARE_PROVIDER_SITE_OTHER): Payer: Medicare PPO | Admitting: Allergy

## 2020-07-22 ENCOUNTER — Other Ambulatory Visit: Payer: Self-pay

## 2020-07-22 VITALS — BP 120/68 | HR 72 | Temp 95.2°F | Resp 18 | Ht 66.0 in | Wt 116.0 lb

## 2020-07-22 DIAGNOSIS — J310 Chronic rhinitis: Secondary | ICD-10-CM | POA: Diagnosis not present

## 2020-07-22 DIAGNOSIS — Z8709 Personal history of other diseases of the respiratory system: Secondary | ICD-10-CM

## 2020-07-22 DIAGNOSIS — J4520 Mild intermittent asthma, uncomplicated: Secondary | ICD-10-CM

## 2020-07-22 DIAGNOSIS — D802 Selective deficiency of immunoglobulin A [IgA]: Secondary | ICD-10-CM

## 2020-07-22 MED ORDER — ALBUTEROL SULFATE HFA 108 (90 BASE) MCG/ACT IN AERS: 2.0000 | INHALATION_SPRAY | RESPIRATORY_TRACT | 2 refills | Status: DC | PRN

## 2020-07-22 NOTE — Progress Notes (Signed)
Follow Up Note  RE: Jo Gomez MRN: [id number] DOB: [birth year] Date of Office Visit: 07/22/2020  Referring provider: Binnie Rail, MD Primary care provider: Binnie Rail, MD  Chief Complaint: Follow-up  History of Present Illness: I had the pleasure of seeing Jo Gomez for a follow up visit at the [facility] on 07/22/2020. She is a 74 y.o. female, who is being followed for IgA deficiency, history of frequent upper respiratory infection, chronic rhinitis and reactive airway disease. Her previous allergy office visit was on 04/15/2020 with Dr. Maudie Mercury. Today is a regular follow up visit.  IgA deficiency No infections/antibiotics since the last visit. Patient had Moderna COVID-19 booster in September with no issues. She had less side effects with booster than the 2nd injection.  Did not get tetanus booster yet.  Got a medical alert bracelet for this.  Chronic rhinitis No additional sinus infections.   Reactive airway disease Patient used albuterol this morning due to chest tightness, coughing with good benefit. Denies any fevers/chills. No sick contacts.   Having some pain in the stomach, nauseated which has improved.  Saw GI for this and had EGD which was unremarkable per patient report.  Taking Nexium daily. Scheduled for HIDA scan.  Assessment and Plan: Jo Gomez is a 74 y.o. female with: IgA deficiency [facility]) Past history - Patient was diagnosed with IgA deficiency at [facility] over 10 years ago. Father had IgA deficiency as well. Patient has issues with frequent sinopulmonary infections requiring prolonged courses of antibiotics. 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  Interim history - no additional infections/antibiotics. Got Moderna booster. Normal IgG and IgM; low IgA, pneumococcal titers adequate, tetanus titer was positive, diptheria titer was low.   Keep track of infections.  Usually need at least 14-21 days worth of antibiotics with  infections.   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 There is a small risk of allergic reaction  Recommend getting the Tdap.   Get bloodwork 4 weeks after the shot.   Think about getting the flu shot.   Chronic rhinitis Past history - Frequent sinus infections requiring antibiotics. Environmental allergy panel was negative.   Monitor symptoms.   Reactive airway disease Past history - Respiratory symptoms with URIs mainly for the past 10 years. Uses albuterol in these situations with good benefit. 2021 spirometry was normal. Interim history - used albuterol this morning due to chest tightness and coughing with good benefit. Otherwise no inhaler use.   May use albuterol rescue inhaler 2 puffs every 4 to 6 hours as needed for shortness of breath, chest tightness, coughing, and wheezing. Monitor frequency of use.  Will get spirometry at next visit instead of today due to COVID-19 pandemic and trying to minimize any type of aerosolizing procedures at this time in the office.   Return in about 6 months (around 01/19/2021).  Follow up with GI regarding the abdominal pain.  Meds ordered this encounter  Medications  . albuterol (VENTOLIN HFA) 108 (90 Base) MCG/ACT inhaler    Sig: Inhale 2 puffs into the lungs every 4 (four) hours as needed for wheezing or shortness of breath (chest tightness, coughing).    Dispense:  8 g    Refill:  2    Lab Orders     Diphtheria / Tetanus Antibody Panel  Diagnostics: None.  Medication List:  Current Outpatient Medications  Medication Sig Dispense Refill  . calcium-vitamin D (  OSCAL WITH D) 500-200 MG-UNIT TABS tablet Take by mouth.    . cyanocobalamin (,VITAMIN B-12,) 1000 MCG/ML injection Inject 1 Ml under the skin ever 30 days. 30 mL 0  . esomeprazole (NEXIUM) 40 MG capsule Take 1 capsule (40 mg total) by mouth daily at 12 noon. 90 capsule 3  . estrogen-methylTESTOSTERone (EST ESTROGENS-METHYLTEST HS)  0.625-1.25 MG tablet 1 tab three times weekly. 36 tablet 1  . levothyroxine (SYNTHROID) 75 MCG tablet TAKE 1 TABLET(75 MCG) BY MOUTH DAILY BEFORE BREAKFAST 90 tablet 3  . rizatriptan (MAXALT) 10 MG tablet Take 1 tablet (10 mg total) by mouth as needed for migraine. May repeat in 2 hours if needed 10 tablet 5  . sertraline (ZOLOFT) 100 MG tablet Take 1.5 tablets (150 mg total) by mouth daily. Annual appt due in June must see provider for future refills (Patient taking differently: Take 150 mg by mouth 2 (two) times daily. Patient reports she takes medication BID) 135 tablet 0  . sodium chloride (OCEAN) 0.65 % SOLN nasal spray Place 1 spray into both nostrils as needed for congestion. 15 mL 0  . traZODone (DESYREL) 150 MG tablet TAKE 1 TABLET(150 MG) BY MOUTH AT BEDTIME 90 tablet 3  . TUBERCULIN SYR 1CC/25GX5/8" (SAFETY-LOK TB SYR 1CC/25GX5/8") 25G X 5/8" 1 ML MISC USE AS DIRECTED FOR B-12 INJECTIONS. 20 each 3  . valACYclovir (VALTREX) 1000 MG tablet TAKE 2 TABLETS BY MOUTH EVERY 12 HOURS FOR 1 DAY AS NEEDED 20 tablet 3  . Vitamin D, Ergocalciferol, (DRISDOL) 1.25 MG (50000 UNIT) CAPS capsule 1 capsule every other week. 6 capsule 4  . albuterol (VENTOLIN HFA) 108 (90 Base) MCG/ACT inhaler Inhale 2 puffs into the lungs every 4 (four) hours as needed for wheezing or shortness of breath (chest tightness, coughing). 8 g 2   No current facility-administered medications for this visit.   Allergies: Allergies  Allergen Reactions  . Gammagard S-D Less Iga [Immune Globulin (Human)] Anaphylaxis    Have to have triple washed blood products due to this allergy pt states  . Clarithromycin     Severe leg and muscle cramps  . Erythromycin Base Nausea And Vomiting  . Levaquin [Levofloxacin In D5w]     tendonitis  . Codeine Rash    Syrup only, can take hydrocodone, oxycodone  . Other Other (See Comments) and Rash    IGA Deficiency - catches a lot of viruses  . Sulfonamide Derivatives Rash  . Tetracycline  Rash   I reviewed her past medical history, social history, family history, and environmental history and no significant changes have been reported from her previous visit.  Review of Systems  Constitutional: Negative for appetite change, chills, fever and unexpected weight change.  HENT: Negative for congestion and rhinorrhea.   Eyes: Negative for itching.  Respiratory: Positive for cough and chest tightness. Negative for shortness of breath and wheezing.   Cardiovascular: Negative for chest pain.  Gastrointestinal: Positive for abdominal pain and nausea.  Genitourinary: Negative for difficulty urinating.  Skin: Negative for rash.  Neurological: Negative for headaches.   Objective: BP 120/68 (BP Location: Left Arm, Patient Position: Sitting, Cuff Size: Normal)   Pulse 72   Temp (!) 95.2 F (35.1 C) (Temporal)   Resp 18   Ht 5\' 6"  (1.676 m)   Wt 116 lb (52.6 kg)   LMP 08/24/1997   SpO2 98%   BMI 18.72 kg/m  Body mass index is 18.72 kg/m. Physical Exam Vitals and nursing note reviewed.  Constitutional:  Appearance: Normal appearance. She is well-developed.  HENT:     Head: Normocephalic and atraumatic.     Right Ear: External ear normal.     Left Ear: External ear normal.     Nose: Nose normal.     Mouth/Throat:     Mouth: Mucous membranes are moist.     Pharynx: Oropharynx is clear.  Eyes:     Conjunctiva/sclera: Conjunctivae normal.  Cardiovascular:     Rate and Rhythm: Normal rate and regular rhythm.     Heart sounds: Normal heart sounds. No murmur heard.  No friction rub. No gallop.   Pulmonary:     Effort: Pulmonary effort is normal.     Breath sounds: Normal breath sounds. No wheezing, rhonchi or rales.  Musculoskeletal:     Cervical back: Neck supple.  Skin:    General: Skin is warm.     Findings: No rash.  Neurological:     Mental Status: She is alert and oriented to person, place, and time.  Psychiatric:        Behavior: Behavior normal.     Previous notes and tests were reviewed. The plan was reviewed with the patient/family, and all questions/concerned were addressed.  It was my pleasure to see Jo Gomez today and participate in her care. Please feel free to contact me with any questions or concerns.  Sincerely,  Rexene Alberts, DO Allergy & Immunology  [facility] office: [place] office: [phone]

## 2020-07-22 NOTE — Patient Instructions (Addendum)
Frequent infections:   Keep track of infections.  Usually need at least 14-21 days worth of antibiotics with infections.   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 There is a small risk of allergic reaction  Recommend getting the Tdap.   Get bloodwork 4 weeks after the shot.   Think about getting the flu shot.   Breathing:  May use albuterol rescue inhaler 2 puffs every 4 to 6 hours as needed for shortness of breath, chest tightness, coughing, and wheezing. Monitor frequency of use.  Follow up in 6 months or sooner if needed.  Follow up with GI regarding the stomach pains.

## 2020-07-22 NOTE — Assessment & Plan Note (Signed)
Past history - Respiratory symptoms with URIs mainly for the past 10 years. Uses albuterol in these situations with good benefit. 2021 spirometry was normal. Interim history - used albuterol this morning due to chest tightness and coughing with good benefit. Otherwise no inhaler use.   May use albuterol rescue inhaler 2 puffs every 4 to 6 hours as needed for shortness of breath, chest tightness, coughing, and wheezing. Monitor frequency of use.  Will get spirometry at next visit instead of today due to COVID-19 pandemic and trying to minimize any type of aerosolizing procedures at this time in the office.

## 2020-07-22 NOTE — Assessment & Plan Note (Signed)
Past history - Patient was diagnosed with IgA deficiency at [facility] over 10 years ago. Father had IgA deficiency as well. Patient has issues with frequent sinopulmonary infections requiring prolonged courses of antibiotics. 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  Interim history - no additional infections/antibiotics. Got Moderna booster. Normal IgG and IgM; low IgA, pneumococcal titers adequate, tetanus titer was positive, diptheria titer was low.   Keep track of infections.  Usually need at least 14-21 days worth of antibiotics with infections.   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 There is a small risk of allergic reaction  Recommend getting the Tdap.   Get bloodwork 4 weeks after the shot.   Think about getting the flu shot.

## 2020-07-22 NOTE — Assessment & Plan Note (Signed)
Past history - Frequent sinus infections requiring antibiotics. Environmental allergy panel was negative.   Monitor symptoms.

## 2020-07-25 ENCOUNTER — Other Ambulatory Visit: Payer: Self-pay | Admitting: Internal Medicine

## 2020-07-25 ENCOUNTER — Encounter: Payer: Self-pay | Admitting: Internal Medicine

## 2020-07-25 DIAGNOSIS — F32A Depression, unspecified: Secondary | ICD-10-CM

## 2020-07-26 MED ORDER — SERTRALINE HCL 100 MG PO TABS: 200.0000 mg | ORAL_TABLET | Freq: Every day | ORAL | 3 refills | Status: DC

## 2020-07-30 ENCOUNTER — Other Ambulatory Visit: Payer: Self-pay

## 2020-07-30 ENCOUNTER — Encounter (HOSPITAL_COMMUNITY)
Admission: RE | Admit: 2020-07-30 | Discharge: 2020-07-30 | Disposition: A | Discharge status: Home / Self Care | Payer: Medicare PPO | Source: Ambulatory Visit | Attending: Internal Medicine | Admitting: Internal Medicine

## 2020-07-30 DIAGNOSIS — R110 Nausea: Secondary | ICD-10-CM | POA: Diagnosis not present

## 2020-07-30 DIAGNOSIS — R1011 Right upper quadrant pain: Secondary | ICD-10-CM | POA: Insufficient documentation

## 2020-07-30 MED ORDER — TECHNETIUM TC 99M MEBROFENIN IV KIT
5.5000 | PACK | Freq: Once | INTRAVENOUS | Status: AC | PRN
  Administered 2020-07-30: 5.5 via INTRAVENOUS

## 2020-08-07 ENCOUNTER — Encounter: Payer: Self-pay | Admitting: Allergy

## 2020-08-07 ENCOUNTER — Other Ambulatory Visit (INDEPENDENT_AMBULATORY_CARE_PROVIDER_SITE_OTHER): Payer: Medicare PPO

## 2020-08-07 ENCOUNTER — Encounter: Payer: Self-pay | Admitting: Internal Medicine

## 2020-08-07 DIAGNOSIS — R7401 Elevation of levels of liver transaminase levels: Secondary | ICD-10-CM | POA: Diagnosis not present

## 2020-08-07 LAB — HEPATIC FUNCTION PANEL
ALT: 72 U/L — ABNORMAL HIGH (ref 0–35)
AST: 42 U/L — ABNORMAL HIGH (ref 0–37)
Albumin: 4.4 g/dL (ref 3.5–5.2)
Alkaline Phosphatase: 83 U/L (ref 39–117)
Bilirubin, Direct: 0.1 mg/dL (ref 0.0–0.3)
Total Bilirubin: 0.4 mg/dL (ref 0.2–1.2)
Total Protein: 7.1 g/dL (ref 6.0–8.3)

## 2020-08-08 ENCOUNTER — Other Ambulatory Visit: Payer: Self-pay

## 2020-08-08 ENCOUNTER — Other Ambulatory Visit: Payer: Medicare PPO

## 2020-08-08 DIAGNOSIS — R7401 Elevation of levels of liver transaminase levels: Secondary | ICD-10-CM

## 2020-08-08 NOTE — Addendum Note (Signed)
Addended by: Lerry Liner on: 08/08/2020 02:46 PM   Modules accepted: Orders

## 2020-08-08 NOTE — Telephone Encounter (Signed)
Dr Maudie Mercury I got the diphtheria vaccine but it included Tetanus, Diphtheria, and Pertussis.  It was done on 08/05/20.    As you requested I will get a blood test done in 4 weeks, Thank you, Jo Gomez I recently had a followup bloodwork done and my ALT and AST levels are still elevated.

## 2020-08-12 ENCOUNTER — Encounter: Payer: Self-pay | Admitting: Internal Medicine

## 2020-08-12 ENCOUNTER — Telehealth (INDEPENDENT_AMBULATORY_CARE_PROVIDER_SITE_OTHER): Payer: Medicare PPO | Admitting: Internal Medicine

## 2020-08-12 DIAGNOSIS — N3000 Acute cystitis without hematuria: Secondary | ICD-10-CM

## 2020-08-12 MED ORDER — CEPHALEXIN 500 MG PO CAPS: 500.0000 mg | ORAL_CAPSULE | Freq: Two times a day (BID) | ORAL | 0 refills | Status: DC

## 2020-08-12 NOTE — Progress Notes (Signed)
Virtual Visit via telephone note  I connected with Jo Gomez on 08/12/20 at  3:30 PM EST by telephone and verified that I am speaking with the correct person using two identifiers.   I discussed the limitations of evaluation and management by telemedicine and the availability of in person appointments. The patient expressed understanding and agreed to proceed.  Present for the visit:  Myself, Dr Billey Gosling, [name].  The patient is currently at home and I am in the office.    No referring provider.    History of Present Illness: This is an acute visit for ? UTI  Her symptoms started about one week ago.  The symptoms are similar to previous urinary tract infections-last one was decades ago.  She states increase in frequency, urgency, some incontinence for a while and some dysuria.  She has not noticed any blood in the urine, fevers, nausea, abdominal pain or back pain.  She has been trying to increase her fluids.    Review of Systems  Constitutional: Negative for fever.  Gastrointestinal: Negative for abdominal pain and nausea.  Genitourinary: Positive for dysuria, frequency and urgency. Negative for hematuria.       Mild incontinence  Musculoskeletal: Negative for back pain.      Social History   Socioeconomic History   Marital status: Married    Spouse name: [name]   Number of children: 1   Years of education: Not on file   Highest education level: Master's degree (e.g., MA, MS, MEng, MEd, MSW, MBA)  Occupational History    Employer: RETIRED  Tobacco Use   Smoking status: Former Smoker    Packs/day: 0.50    Years: 3.00    Pack years: 1.50    Types: Cigarettes    Quit date: 08/24/1968    Years since quitting: 52.0   Smokeless tobacco: Never Used   Tobacco comment: in college  Vaping Use   Vaping Use: Never used  Substance and Sexual Activity   Alcohol use: Yes    Alcohol/week: 2.0 standard drinks    Types: 2 Standard drinks or equivalent  per week   Drug use: No   Sexual activity: Yes    Partners: Female    Birth control/protection: Surgical    Comment: TAH/BSO  Other Topics Concern   Not on file  Social History Narrative   Patient is right-handed. She lives with her wife in a split level house. She occasionally drinks coffee.   Social Determinants of Health   Financial Resource Strain: Low Risk    Difficulty of Paying Living Expenses: Not hard at all  Food Insecurity: No Food Insecurity   Worried About Charity fundraiser in the Last Year: Never true   [address] in the Last Year: Never true  Transportation Needs: No Transportation Needs   Lack of Transportation (Medical): No   Lack of Transportation (Non-Medical): No  Physical Activity: Sufficiently Active   Days of Exercise per Week: 7 days   Minutes of Exercise per Session: 30 min  Stress: No Stress Concern Present   Feeling of Stress : Not at all  Social Connections: Unknown   Frequency of Communication with Friends and Family: More than three times a week   Frequency of Social Gatherings with Friends and Family: More than three times a week   Attends Religious Services: Patient refused   Active Member of Clubs or Organizations: Yes   Attends Archivist Meetings: More than 4 times  per year   Marital Status: Married      Assessment and Plan:  See Problem List for Assessment and Plan of chronic medical problems.   Follow Up Instructions:    I discussed the assessment and treatment plan with the patient. The patient was provided an opportunity to ask questions and all were answered. The patient agreed with the plan and demonstrated an understanding of the instructions.   The patient was advised to call back or seek an in-person evaluation if the symptoms worsen or if the condition fails to improve as anticipated.  Time spent telephone call: 8 minutes  Binnie Rail, MD

## 2020-08-12 NOTE — Assessment & Plan Note (Signed)
Acute Unfortunately we are limited here today since this is a telephone visit Symptoms consistent with possible UTI, so we will treat empirically Start Keflex 500 mg twice daily x7 days Increase fluids She will call if there is no improvement-if this is the case she will need to drop off a urine sample

## 2020-08-13 ENCOUNTER — Other Ambulatory Visit: Payer: Medicare PPO

## 2020-08-13 ENCOUNTER — Other Ambulatory Visit: Payer: Self-pay

## 2020-08-13 DIAGNOSIS — D802 Selective deficiency of immunoglobulin A [IgA]: Secondary | ICD-10-CM

## 2020-08-13 LAB — ANA: Anti Nuclear Antibody (ANA): NEGATIVE

## 2020-08-13 LAB — HEPATITIS C ANTIBODY
Hepatitis C Ab: NONREACTIVE
SIGNAL TO CUT-OFF: 0 (ref <1.00)

## 2020-08-13 LAB — ALPHA-1-ANTITRYPSIN: A-1 Antitrypsin, Ser: 164 mg/dL (ref 83–199)

## 2020-08-13 LAB — IGA: Immunoglobulin A: <5 mg/dL — ABNORMAL LOW (ref 70–320)

## 2020-08-13 LAB — IGG: IgG (Immunoglobin G), Serum: 911 mg/dL (ref 600–1540)

## 2020-08-13 LAB — HEPATITIS B SURFACE ANTIGEN: Hepatitis B Surface Ag: NONREACTIVE

## 2020-08-13 LAB — HEPATITIS B CORE ANTIBODY, TOTAL: Hep B Core Total Ab: NONREACTIVE

## 2020-08-13 LAB — TISSUE TRANSGLUTAMINASE, IGA: (tTG) Ab, IgA: <1 U/mL

## 2020-08-13 LAB — ANTI-SMOOTH MUSCLE ANTIBODY, IGG: Actin (Smooth Muscle) Antibody (IGG): <20 U (ref <20)

## 2020-08-13 LAB — MITOCHONDRIAL ANTIBODIES: Mitochondrial M2 Ab, IgG: <=20 U

## 2020-08-13 LAB — CERULOPLASMIN: Ceruloplasmin: 30 mg/dL (ref 18–53)

## 2020-08-21 LAB — SPECIMEN STATUS REPORT

## 2020-08-22 LAB — CELIAC HLA RFLX TO ABS
DQ2 (DQA1 0501/0505,DQB1 02XX): NEGATIVE
DQ8 (DQA1 03XX, DQB1 0302): NEGATIVE

## 2020-09-06 DIAGNOSIS — Z8709 Personal history of other diseases of the respiratory system: Secondary | ICD-10-CM | POA: Diagnosis not present

## 2020-09-10 ENCOUNTER — Encounter: Payer: Self-pay | Admitting: Internal Medicine

## 2020-09-10 DIAGNOSIS — R7989 Other specified abnormal findings of blood chemistry: Secondary | ICD-10-CM | POA: Insufficient documentation

## 2020-09-10 LAB — DIPHTHERIA / TETANUS ANTIBODY PANEL
Diphtheria Ab: 0.34 IU/mL (ref <0.10)
Tetanus Ab, IgG: 6.01 IU/mL (ref <0.10)

## 2020-09-17 ENCOUNTER — Encounter: Payer: Self-pay | Admitting: Internal Medicine

## 2020-09-17 DIAGNOSIS — M8589 Other specified disorders of bone density and structure, multiple sites: Secondary | ICD-10-CM

## 2020-09-19 ENCOUNTER — Other Ambulatory Visit (INDEPENDENT_AMBULATORY_CARE_PROVIDER_SITE_OTHER): Payer: Medicare PPO

## 2020-09-19 DIAGNOSIS — R7989 Other specified abnormal findings of blood chemistry: Secondary | ICD-10-CM | POA: Diagnosis not present

## 2020-09-19 LAB — HEPATIC FUNCTION PANEL
ALT: 34 U/L (ref 0–35)
AST: 25 U/L (ref 0–37)
Albumin: 4.2 g/dL (ref 3.5–5.2)
Alkaline Phosphatase: 76 U/L (ref 39–117)
Bilirubin, Direct: 0.1 mg/dL (ref 0.0–0.3)
Total Bilirubin: 0.4 mg/dL (ref 0.2–1.2)
Total Protein: 6.8 g/dL (ref 6.0–8.3)

## 2020-09-20 ENCOUNTER — Other Ambulatory Visit: Payer: Self-pay | Admitting: Obstetrics & Gynecology

## 2020-09-27 ENCOUNTER — Other Ambulatory Visit: Payer: Self-pay

## 2020-09-27 ENCOUNTER — Ambulatory Visit
Admission: RE | Admit: 2020-09-27 | Discharge: 2020-09-27 | Disposition: A | Discharge status: Home / Self Care | Payer: Medicare PPO | Source: Ambulatory Visit | Attending: Internal Medicine | Admitting: Internal Medicine

## 2020-09-27 DIAGNOSIS — Z780 Asymptomatic menopausal state: Secondary | ICD-10-CM | POA: Diagnosis not present

## 2020-09-27 DIAGNOSIS — M8589 Other specified disorders of bone density and structure, multiple sites: Secondary | ICD-10-CM | POA: Diagnosis not present

## 2020-10-10 ENCOUNTER — Encounter: Payer: Self-pay | Admitting: Internal Medicine

## 2020-10-14 MED ORDER — TRIAMCINOLONE ACETONIDE 0.5 % EX OINT: 1.0000 "application " | TOPICAL_OINTMENT | Freq: Two times a day (BID) | CUTANEOUS | 0 refills | Status: DC

## 2020-10-14 NOTE — Addendum Note (Signed)
Addended by: Binnie Rail on: 10/14/2020 09:11 PM   Modules accepted: Orders

## 2020-10-22 HISTORY — PX: LUMBAR DISC SURGERY: SHX700

## 2020-10-23 MED ORDER — KETOCONAZOLE 2 % EX CREA: 1.0000 "application " | TOPICAL_CREAM | Freq: Every day | CUTANEOUS | 0 refills | Status: DC

## 2020-10-23 NOTE — Addendum Note (Signed)
Addended by: Binnie Rail on: 10/23/2020 07:42 AM   Modules accepted: Orders

## 2020-10-26 ENCOUNTER — Other Ambulatory Visit: Payer: Self-pay | Admitting: Obstetrics & Gynecology

## 2020-10-26 ENCOUNTER — Encounter (HOSPITAL_BASED_OUTPATIENT_CLINIC_OR_DEPARTMENT_OTHER): Payer: Self-pay

## 2020-10-27 ENCOUNTER — Other Ambulatory Visit: Payer: Self-pay | Admitting: Internal Medicine

## 2020-10-29 ENCOUNTER — Other Ambulatory Visit (HOSPITAL_BASED_OUTPATIENT_CLINIC_OR_DEPARTMENT_OTHER): Payer: Self-pay | Admitting: Obstetrics & Gynecology

## 2020-10-29 MED ORDER — EST ESTROGENS-METHYLTEST 0.625-1.25 MG PO TABS: ORAL_TABLET | ORAL | 1 refills | Status: DC

## 2020-10-30 ENCOUNTER — Other Ambulatory Visit: Payer: Self-pay | Admitting: Internal Medicine

## 2020-11-01 ENCOUNTER — Encounter: Payer: Self-pay | Admitting: Internal Medicine

## 2020-11-05 ENCOUNTER — Encounter: Payer: Self-pay | Admitting: Internal Medicine

## 2020-11-05 NOTE — Progress Notes (Signed)
Subjective:    Patient ID: Jo Gomez, female    DOB: 29-Sep-1945, 75 y.o.   MRN: [id number]  HPI The patient is here for an acute visit.   Sores on leg - she has tried a steroid cream and anti-fungal rash w/o improvement.    The rash started in the fall maybe - she had some spots on the legs and initially just thought it was from working in the yard/ bugs.  The rash started getting worse in December maybe.  The rash itches.  The rash is getting worse - she is getting new one lesions.  She noticed two lesion on her left arm today.   She otherwise feel very well.  No recent GI bugs or URIs.        Medications and allergies reviewed with patient and updated if appropriate.  Patient Active Problem List   Diagnosis Date Noted  . Elevated LFTs 09/10/2020  . Acute cystitis 08/12/2020  . RUQ pain 07/02/2020  . Epigastric pain 07/02/2020  . Urine frequency 07/02/2020  . History of frequent upper respiratory infection 04/15/2020  . Chronic rhinitis 04/15/2020  . Chronic bronchitis ([place]) 02/01/2019  . Migraine headache 01/31/2019  . Hyperglycemia 01/30/2019  . Chronic cough 01/02/2019  . Cardiac murmur 09/29/2016  . Pain of both shoulder joints 06/24/2016  . 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 Chronic sinusitis 01/22/2016  . Insomnia 09/16/2015  . Low back pain radiating to left leg 06/25/2014  . 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 Vitamin B12 deficiency 03/05/2010  . Esophageal reflux 03/04/2010  . IgA deficiency ([place]) 10/18/2009  . Hypothyroidism 09/05/2007  . Allergic rhinitis 09/05/2007  . Depression 10/21/2006  . Reactive airway disease 10/21/2006  . Osteopenia 10/21/2006    Current Outpatient Medications on File Prior to Visit  Medication Sig Dispense Refill  . albuterol (VENTOLIN HFA) 108 (90 Base) MCG/ACT inhaler Inhale 2 puffs into the lungs every 4 (four) hours as needed for wheezing or shortness of breath (chest  tightness, coughing). 8 g 2  . calcium-vitamin D (OSCAL WITH D) 500-200 MG-UNIT TABS tablet Take by mouth.    . cyanocobalamin (,VITAMIN B-12,) 1000 MCG/ML injection Inject 1 Ml under the skin ever 30 days. 30 mL 0  . estrogen-methylTESTOSTERone (EST ESTROGENS-METHYLTEST HS) 0.625-1.25 MG tablet TAKE 1 TABLET BY MOUTH 3 TIMES WEEKLY 36 tablet 1  . ketoconazole (NIZORAL) 2 % cream APPLY 1 APPLICATION TOPICALLY DAILY FOR 2 WEEKS 15 g 0  . levothyroxine (SYNTHROID) 75 MCG tablet TAKE 1 TABLET(75 MCG) BY MOUTH DAILY BEFORE BREAKFAST 90 tablet 3  . rizatriptan (MAXALT) 10 MG tablet Take 1 tablet (10 mg total) by mouth as needed for migraine. May repeat in 2 hours if needed 10 tablet 5  . sertraline (ZOLOFT) 100 MG tablet Take 2 tablets (200 mg total) by mouth daily. 180 tablet 3  . sodium chloride (OCEAN) 0.65 % SOLN nasal spray Place 1 spray into both nostrils as needed for congestion. 15 mL 0  . traZODone (DESYREL) 150 MG tablet TAKE 1 TABLET(150 MG) BY MOUTH AT BEDTIME 90 tablet 3  . triamcinolone ointment (KENALOG) 0.5 % APPLY TOPICALLY TO THE AFFECTED AREA TWICE DAILY 15 g 0  . TUBERCULIN SYR 1CC/25GX5/8" (SAFETY-LOK TB SYR 1CC/25GX5/8") 25G X 5/8" 1 ML MISC USE AS DIRECTED FOR B-12 INJECTIONS. 20 each 3  . valACYclovir (VALTREX) 1000 MG tablet TAKE 2 TABLETS BY MOUTH EVERY 12 HOURS FOR 1 DAY  AS NEEDED 20 tablet 3  . Vitamin D, Ergocalciferol, (DRISDOL) 1.25 MG (50000 UNIT) CAPS capsule 1 capsule every other week. 6 capsule 4   No current facility-administered medications on file prior to visit.    Past Medical History:  Diagnosis Date  . Allergy   . Anxiety   . 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 Asthma   . Depression   . GERD (gastroesophageal reflux disease)    diet controlled, no med  . History of endoscopy   . HSV infection   . IgA deficiency ([place])   . Low vitamin B12 level   . Osteoporosis   . Ovarian cyst    TAH/BSO  . Shingles   . Thyroid dysfunction      Past Surgical History:  Procedure Laterality Date  . broken arm     surgery for this  . BROW LIFT  1/15  . COLONOSCOPY    . THYROIDECTOMY  2000  . TOTAL ABDOMINAL HYSTERECTOMY W/ BILATERAL SALPINGOOPHORECTOMY  1999    Social History   Socioeconomic History  . Marital status: Married    Spouse name: [name]  . Number of children: 1  . Years of education: Not on file  . Highest education level: Master's degree (e.g., MA, MS, MEng, MEd, MSW, MBA)  Occupational History    Employer: RETIRED  Tobacco Use  . Smoking status: Former Smoker    Packs/day: 0.50    Years: 3.00    Pack years: 1.50    Types: Cigarettes    Quit date: 08/24/1968    Years since quitting: 52.2  . Smokeless tobacco: Never Used  . Tobacco comment: in college  Vaping Use  . Vaping Use: Never used  Substance and Sexual Activity  . Alcohol use: Yes    Alcohol/week: 2.0 standard drinks    Types: 2 Standard drinks or equivalent per week  . Drug use: No  . Sexual activity: Yes    Partners: Female    Birth control/protection: Surgical    Comment: TAH/BSO  Other Topics Concern  . Not on file  Social History Narrative   Patient is right-handed. She lives with her wife in a split level house. She occasionally drinks coffee.   Social Determinants of Health   Financial Resource Strain: Low Risk   . Difficulty of Paying Living Expenses: Not hard at all  Food Insecurity: No Food Insecurity  . Worried About Charity fundraiser in the Last Year: Never true  . Ran Out of Food in the Last Year: Never true  Transportation Needs: No Transportation Needs  . Lack of Transportation (Medical): No  . Lack of Transportation (Non-Medical): No  Physical Activity: Sufficiently Active  . Days of Exercise per Week: 7 days  . Minutes of Exercise per Session: 30 min  Stress: No Stress Concern Present  . Feeling of Stress : Not at all  Social Connections: Unknown  . Frequency of Communication with Friends and Family: More  than three times a week  . Frequency of Social Gatherings with Friends and Family: More than three times a week  . Attends Religious Services: Patient refused  . Active Member of Clubs or Organizations: Yes  . Attends Archivist Meetings: More than 4 times per year  . Marital Status: Married    Family History  Problem Relation Age of Onset  . COPD Father        chronic lung infections  . Colon polyps Father   .  Heart attack Mother   . Osteoporosis Mother   . Lung cancer Mother   . Colon polyps Mother   . Colon cancer Neg Hx   . Esophageal cancer Neg Hx   . Stomach cancer Neg Hx   . Rectal cancer Neg Hx     Review of Systems  Constitutional: Negative for chills and fever.  Respiratory: Negative for cough, shortness of breath and wheezing.   Cardiovascular: Negative for chest pain, palpitations and leg swelling.  Gastrointestinal: Negative for abdominal pain and nausea.  Musculoskeletal: Negative for arthralgias.  Skin: Positive for rash.  Neurological: Negative for light-headedness and headaches.       Objective:   Vitals:   11/06/20 0952  BP: 116/76  Pulse: 72  Temp: 98.3 F (36.8 C)  SpO2: 97%   BP Readings from Last 3 Encounters:  11/06/20 116/76  07/22/20 120/68  07/02/20 118/62   Wt Readings from Last 3 Encounters:  11/06/20 117 lb (53.1 kg)  07/22/20 116 lb (52.6 kg)  07/02/20 112 lb 12.8 oz (51.2 kg)   Body mass index is 18.88 kg/m.   Physical Exam Constitutional:      General: She is not in acute distress.    Appearance: Normal appearance. She is not ill-appearing.  HENT:     Head: Normocephalic and atraumatic.  Cardiovascular:     Rate and Rhythm: Normal rate and regular rhythm.  Pulmonary:     Effort: Pulmonary effort is normal. No respiratory distress.     Breath sounds: No wheezing or rales.  Abdominal:     General: There is no distension.     Palpations: Abdomen is soft.     Tenderness: There is no abdominal tenderness.   Musculoskeletal:     Right lower leg: No edema.     Left lower leg: No edema.  Skin:    General: Skin is warm and dry.     Findings: Rash (b/l lower leg and two lesions on lef tarm - irregular border, erythematous with slight dry lesions that blanch, non tender) present.  Neurological:     Mental Status: She is alert.            Assessment & Plan:    See Problem List for Assessment and Plan of chronic medical problems.    This visit occurred during the SARS-CoV-2 public health emergency.  Safety protocols were in place, including screening questions prior to the visit, additional usage of staff PPE, and extensive cleaning of exam room while observing appropriate contact time as indicated for disinfecting solutions.

## 2020-11-06 ENCOUNTER — Other Ambulatory Visit: Payer: Self-pay

## 2020-11-06 ENCOUNTER — Ambulatory Visit: Payer: Medicare PPO | Admitting: Internal Medicine

## 2020-11-06 VITALS — BP 116/76 | HR 72 | Temp 98.3°F | Ht 66.0 in | Wt 117.0 lb

## 2020-11-06 DIAGNOSIS — R21 Rash and other nonspecific skin eruption: Secondary | ICD-10-CM

## 2020-11-06 LAB — CBC WITH DIFFERENTIAL/PLATELET
Basophils Absolute: 0 10*3/uL (ref 0.0–0.1)
Basophils Relative: 0.4 % (ref 0.0–3.0)
Eosinophils Absolute: 0.2 10*3/uL (ref 0.0–0.7)
Eosinophils Relative: 3.5 % (ref 0.0–5.0)
HCT: 38.6 % (ref 36.0–46.0)
Hemoglobin: 13.2 g/dL (ref 12.0–15.0)
Lymphocytes Relative: 13.2 % (ref 12.0–46.0)
Lymphs Abs: 0.9 10*3/uL (ref 0.7–4.0)
MCHC: 34.3 g/dL (ref 30.0–36.0)
MCV: 89.6 fl (ref 78.0–100.0)
Monocytes Absolute: 0.6 10*3/uL (ref 0.1–1.0)
Monocytes Relative: 9.5 % (ref 3.0–12.0)
Neutro Abs: 5 10*3/uL (ref 1.4–7.7)
Neutrophils Relative %: 73.4 % (ref 43.0–77.0)
Platelets: 247 10*3/uL (ref 150.0–400.0)
RBC: 4.3 Mil/uL (ref 3.87–5.11)
RDW: 13.6 % (ref 11.5–15.5)
WBC: 6.8 10*3/uL (ref 4.0–10.5)

## 2020-11-06 LAB — COMPREHENSIVE METABOLIC PANEL
ALT: 32 U/L (ref 0–35)
AST: 28 U/L (ref 0–37)
Albumin: 4.2 g/dL (ref 3.5–5.2)
Alkaline Phosphatase: 74 U/L (ref 39–117)
BUN: 13 mg/dL (ref 6–23)
CO2: 30 mEq/L (ref 19–32)
Calcium: 9 mg/dL (ref 8.4–10.5)
Chloride: 96 mEq/L (ref 96–112)
Creatinine, Ser: 0.73 mg/dL (ref 0.40–1.20)
GFR: 82 mL/min (ref >60.00)
Glucose, Bld: 83 mg/dL (ref 70–99)
Potassium: 3.9 mEq/L (ref 3.5–5.1)
Sodium: 131 mEq/L — ABNORMAL LOW (ref 135–145)
Total Bilirubin: 0.4 mg/dL (ref 0.2–1.2)
Total Protein: 6.9 g/dL (ref 6.0–8.3)

## 2020-11-06 LAB — C-REACTIVE PROTEIN: CRP: <1 mg/dL (ref 0.5–20.0)

## 2020-11-06 LAB — SEDIMENTATION RATE: Sed Rate: 11 mm/hr (ref 0–30)

## 2020-11-06 NOTE — Assessment & Plan Note (Signed)
New problem No fungal Slightly improvement with topical steroid cream Rash is worsening - getting new lesion, two on left arm ? Vasculitis Recommend derm evaluation, possible biopsy Will check labs - cbc, cmp, esr, crp, ana, anca

## 2020-11-06 NOTE — Patient Instructions (Addendum)
See dermatology to evaluate your rash - possible vasculitis.     No change in medications.  Ok to use topical steroids.      Have blood work downstairs.

## 2020-11-07 ENCOUNTER — Other Ambulatory Visit: Payer: Self-pay | Admitting: Obstetrics & Gynecology

## 2020-11-07 ENCOUNTER — Encounter: Payer: Self-pay | Admitting: Physician Assistant

## 2020-11-07 ENCOUNTER — Ambulatory Visit: Payer: Medicare PPO | Admitting: Physician Assistant

## 2020-11-07 DIAGNOSIS — L409 Psoriasis, unspecified: Secondary | ICD-10-CM | POA: Diagnosis not present

## 2020-11-07 DIAGNOSIS — D485 Neoplasm of uncertain behavior of skin: Secondary | ICD-10-CM | POA: Diagnosis not present

## 2020-11-07 LAB — ANA: Anti Nuclear Antibody (ANA): NEGATIVE

## 2020-11-07 LAB — ANCA SCREEN W REFLEX TITER: ANCA Screen: NEGATIVE

## 2020-11-07 NOTE — Patient Instructions (Signed)

## 2020-11-08 ENCOUNTER — Other Ambulatory Visit: Payer: Self-pay | Admitting: Internal Medicine

## 2020-11-12 ENCOUNTER — Encounter: Payer: Self-pay | Admitting: Physician Assistant

## 2020-11-12 NOTE — Progress Notes (Signed)
   Follow-Up Visit   Subjective  Jo Gomez is a 75 y.o. female who presents for the following: Skin Problem (Rash like on legs- months- now spreading to arms and legs- itches sometimes tx- tac ointment then ringworm and tx - ketoconazole  cream that was no help- PCP thinks it may be vasculitis ).   The following portions of the chart were reviewed this encounter and updated as appropriate:  Tobacco  Allergies  Meds  Problems  Med Hx  Surg Hx  Fam Hx      Objective  Well appearing patient in no apparent distress; mood and affect are within normal limits.  A full examination was performed including scalp, head, eyes, ears, nose, lips, neck, chest, axillae, abdomen, back, buttocks, bilateral upper extremities, bilateral lower extremities, hands, feet, fingers, toes, fingernails, and toenails. All findings within normal limits unless otherwise noted below.  Objective  Left Upper Arm & Right lower Leg: Hyperkeratotic scale with pink base         Assessment & Plan  Neoplasm of uncertain behavior of skin Left Upper Arm & Right lower Leg  Skin / nail biopsy Type of biopsy: tangential   Informed consent: discussed and consent obtained   Timeout: patient name, date of birth, surgical site, and procedure verified   Procedure prep:  Patient was prepped and draped in usual sterile fashion (Non sterile) Prep type:  Chlorhexidine Anesthesia: the lesion was anesthetized in a standard fashion   Anesthetic:  1% lidocaine w/ epinephrine 1-100,000 local infiltration Instrument used: flexible razor blade   Outcome: patient tolerated procedure well   Post-procedure details: wound care instructions given    Specimen 1 - Surgical pathology Differential Diagnosis: r/o psoriasis, lupus, bcc, scc, vasculitis  2 specimens   Check Margins: No    I, Allexa Acoff, PA-C, have reviewed all documentation's for this visit.  The documentation on 11/12/20 for the exam, diagnosis,  procedures and orders are all accurate and complete.

## 2020-11-14 ENCOUNTER — Telehealth: Payer: Self-pay | Admitting: Physician Assistant

## 2020-11-14 NOTE — Telephone Encounter (Signed)
Called patient to let her know pathology results aren't back yet.

## 2020-11-14 NOTE — Telephone Encounter (Signed)
Patient is calling for pathology results from last visit with Kelli Sheffield, PA-C. 

## 2020-11-15 ENCOUNTER — Other Ambulatory Visit: Payer: Self-pay | Admitting: Internal Medicine

## 2020-11-18 ENCOUNTER — Telehealth: Payer: Self-pay | Admitting: *Deleted

## 2020-11-18 MED ORDER — TRIAMCINOLONE ACETONIDE 0.1 % EX CREA: 1.0000 "application " | TOPICAL_CREAM | Freq: Two times a day (BID) | CUTANEOUS | 3 refills | Status: DC | PRN

## 2020-11-18 NOTE — Telephone Encounter (Signed)
Pathology results to patient- per Robyne Askew okay to use Triamcinolone cream- sent to patient pharmacy.  Patient denies having strep.

## 2020-11-18 NOTE — Telephone Encounter (Signed)
-----   Message from Warren Danes, [place] sent at 11/18/2020 12:01 PM EDT ----- 454 TAC.1% BID follow up in 4-6 weeks.  Ask if she previously had a strep infection.

## 2020-11-20 DIAGNOSIS — M19011 Primary osteoarthritis, right shoulder: Secondary | ICD-10-CM | POA: Diagnosis not present

## 2020-11-20 DIAGNOSIS — M19012 Primary osteoarthritis, left shoulder: Secondary | ICD-10-CM | POA: Diagnosis not present

## 2020-11-27 ENCOUNTER — Encounter: Payer: Self-pay | Admitting: Physician Assistant

## 2020-12-02 DIAGNOSIS — M4807 Spinal stenosis, lumbosacral region: Secondary | ICD-10-CM | POA: Diagnosis not present

## 2020-12-02 DIAGNOSIS — M5416 Radiculopathy, lumbar region: Secondary | ICD-10-CM | POA: Diagnosis not present

## 2020-12-10 ENCOUNTER — Ambulatory Visit (HOSPITAL_BASED_OUTPATIENT_CLINIC_OR_DEPARTMENT_OTHER): Payer: Medicare PPO | Admitting: Obstetrics & Gynecology

## 2020-12-25 ENCOUNTER — Other Ambulatory Visit: Payer: Self-pay | Admitting: Neurosurgery

## 2020-12-25 DIAGNOSIS — M5416 Radiculopathy, lumbar region: Secondary | ICD-10-CM

## 2020-12-26 ENCOUNTER — Ambulatory Visit
Admission: RE | Admit: 2020-12-26 | Discharge: 2020-12-26 | Disposition: A | Discharge status: Home / Self Care | Payer: Medicare PPO | Source: Ambulatory Visit | Attending: Neurosurgery | Admitting: Neurosurgery

## 2020-12-26 ENCOUNTER — Other Ambulatory Visit: Payer: Self-pay

## 2020-12-26 ENCOUNTER — Other Ambulatory Visit: Payer: Medicare PPO

## 2020-12-26 DIAGNOSIS — M5416 Radiculopathy, lumbar region: Secondary | ICD-10-CM

## 2020-12-26 DIAGNOSIS — M4727 Other spondylosis with radiculopathy, lumbosacral region: Secondary | ICD-10-CM | POA: Diagnosis not present

## 2020-12-26 MED ORDER — IOPAMIDOL (ISOVUE-M 200) INJECTION 41%
1.0000 mL | Freq: Once | INTRAMUSCULAR | Status: AC
  Administered 2020-12-26: 1 mL via EPIDURAL

## 2020-12-26 MED ORDER — METHYLPREDNISOLONE ACETATE 40 MG/ML INJ SUSP (RADIOLOG
80.0000 mg | Freq: Once | INTRAMUSCULAR | Status: AC
  Administered 2020-12-26: 80 mg via EPIDURAL

## 2020-12-26 NOTE — Discharge Instructions (Signed)

## 2020-12-31 ENCOUNTER — Emergency Department (HOSPITAL_COMMUNITY): Payer: Medicare PPO

## 2020-12-31 ENCOUNTER — Other Ambulatory Visit: Payer: Self-pay

## 2020-12-31 ENCOUNTER — Encounter (HOSPITAL_COMMUNITY): Payer: Self-pay

## 2020-12-31 ENCOUNTER — Emergency Department (HOSPITAL_COMMUNITY)
Admission: EM | Admit: 2020-12-31 | Discharge: 2020-12-31 | Disposition: A | Discharge status: Home / Self Care | Payer: Medicare PPO | Attending: Emergency Medicine | Admitting: Emergency Medicine

## 2020-12-31 DIAGNOSIS — M545 Low back pain, unspecified: Secondary | ICD-10-CM | POA: Diagnosis not present

## 2020-12-31 DIAGNOSIS — M5416 Radiculopathy, lumbar region: Secondary | ICD-10-CM | POA: Insufficient documentation

## 2020-12-31 DIAGNOSIS — M5441 Lumbago with sciatica, right side: Secondary | ICD-10-CM | POA: Diagnosis not present

## 2020-12-31 DIAGNOSIS — E039 Hypothyroidism, unspecified: Secondary | ICD-10-CM | POA: Diagnosis not present

## 2020-12-31 DIAGNOSIS — Z87891 Personal history of nicotine dependence: Secondary | ICD-10-CM | POA: Diagnosis not present

## 2020-12-31 DIAGNOSIS — R202 Paresthesia of skin: Secondary | ICD-10-CM | POA: Insufficient documentation

## 2020-12-31 DIAGNOSIS — R531 Weakness: Secondary | ICD-10-CM | POA: Diagnosis not present

## 2020-12-31 DIAGNOSIS — M5440 Lumbago with sciatica, unspecified side: Secondary | ICD-10-CM

## 2020-12-31 DIAGNOSIS — J45909 Unspecified asthma, uncomplicated: Secondary | ICD-10-CM | POA: Insufficient documentation

## 2020-12-31 DIAGNOSIS — Z79899 Other long term (current) drug therapy: Secondary | ICD-10-CM | POA: Diagnosis not present

## 2020-12-31 DIAGNOSIS — M5410 Radiculopathy, site unspecified: Secondary | ICD-10-CM

## 2020-12-31 LAB — CBC WITH DIFFERENTIAL/PLATELET
Abs Immature Granulocytes: 0.19 10*3/uL — ABNORMAL HIGH (ref 0.00–0.07)
Basophils Absolute: 0 10*3/uL (ref 0.0–0.1)
Basophils Relative: 0 %
Eosinophils Absolute: 0.1 10*3/uL (ref 0.0–0.5)
Eosinophils Relative: 1 %
HCT: 37.8 % (ref 36.0–46.0)
Hemoglobin: 13.2 g/dL (ref 12.0–15.0)
Immature Granulocytes: 2 %
Lymphocytes Relative: 13 %
Lymphs Abs: 1.4 10*3/uL (ref 0.7–4.0)
MCH: 31.6 pg (ref 26.0–34.0)
MCHC: 34.9 g/dL (ref 30.0–36.0)
MCV: 90.4 fL (ref 80.0–100.0)
Monocytes Absolute: 1.2 10*3/uL — ABNORMAL HIGH (ref 0.1–1.0)
Monocytes Relative: 12 %
Neutro Abs: 7.5 10*3/uL (ref 1.7–7.7)
Neutrophils Relative %: 72 %
Platelets: 270 10*3/uL (ref 150–400)
RBC: 4.18 MIL/uL (ref 3.87–5.11)
RDW: 13.5 % (ref 11.5–15.5)
WBC: 10.3 10*3/uL (ref 4.0–10.5)
nRBC: 0 % (ref 0.0–0.2)

## 2020-12-31 LAB — BASIC METABOLIC PANEL
Anion gap: 7 (ref 5–15)
BUN: 12 mg/dL (ref 8–23)
CO2: 27 mmol/L (ref 22–32)
Calcium: 8.6 mg/dL — ABNORMAL LOW (ref 8.9–10.3)
Chloride: 91 mmol/L — ABNORMAL LOW (ref 98–111)
Creatinine, Ser: 0.72 mg/dL (ref 0.44–1.00)
GFR, Estimated: >60 mL/min (ref >60)
Glucose, Bld: 88 mg/dL (ref 70–99)
Potassium: 4.6 mmol/L (ref 3.5–5.1)
Sodium: 125 mmol/L — ABNORMAL LOW (ref 135–145)

## 2020-12-31 MED ORDER — DIAZEPAM 5 MG/ML IJ SOLN
2.5000 mg | Freq: Once | INTRAMUSCULAR | Status: AC
  Administered 2020-12-31: 2.5 mg via INTRAVENOUS
  Filled 2020-12-31: qty 2

## 2020-12-31 MED ORDER — FENTANYL CITRATE (PF) 100 MCG/2ML IJ SOLN
50.0000 ug | Freq: Once | INTRAMUSCULAR | Status: AC
  Administered 2020-12-31: 50 ug via INTRAVENOUS
  Filled 2020-12-31: qty 2

## 2020-12-31 MED ORDER — DEXAMETHASONE SODIUM PHOSPHATE 10 MG/ML IJ SOLN
10.0000 mg | Freq: Once | INTRAMUSCULAR | Status: AC
  Administered 2020-12-31: 10 mg via INTRAVENOUS
  Filled 2020-12-31: qty 1

## 2020-12-31 MED ORDER — GADOBUTROL 1 MMOL/ML IV SOLN
5.0000 mL | Freq: Once | INTRAVENOUS | Status: AC | PRN
  Administered 2020-12-31: 5 mL via INTRAVENOUS

## 2020-12-31 MED ORDER — METHYLPREDNISOLONE 4 MG PO TBPK: ORAL_TABLET | ORAL | 0 refills | Status: DC

## 2020-12-31 NOTE — ED Notes (Signed)
All appropriate discharge materials reviewed at length with patient. Time for questions provided. Pt has no other questions at this time and verbalizes understanding of all provided materials.  

## 2020-12-31 NOTE — ED Triage Notes (Signed)
Pt presents to ED with increase lower back pain. Pt has a hx of the same. Her PCP is on vacation. Received an injection in her spine 1 week ago. Pt here for pain control, tingling to BLE, hands feel shaky after receiving her injection last week.  Pt just finished a prednisone pack.Currently taking Robaxin and Gabapentin until last night. She stopped taking both if these because of feeling "panicky" and very shaky"

## 2020-12-31 NOTE — ED Provider Notes (Addendum)
[facility]   CSN: [id number] Arrival date & time: 12/31/20  1010     History Chief Complaint  Patient presents with  . Back Pain    Jo Gomez is a 75 y.o. female.  History of chronic back pain.  Had epidural last week and still having right lower leg pain and weakness.  States that maybe she is has some issues with holding her urine.  Denies any loss of stool or numbness in between her legs.  She feels like her right lower leg was giving out on her all day yesterday.  She is having pain from the right lower back all the way down into her right foot.  Finished a course of steroids recently.  Taking gabapentin and Robaxin as well.  The history is provided by the patient.  Back Pain Location:  Lumbar spine Quality:  Aching Radiates to:  R foot Pain severity:  Mild Onset quality:  Gradual Timing:  Constant Progression:  Worsening Chronicity:  Recurrent Worsened by:  Ambulation Associated symptoms: bladder incontinence (maybe), numbness, paresthesias and weakness   Associated symptoms: no abdominal pain, no abdominal swelling, no bowel incontinence, no chest pain, no dysuria, no fever, no headaches, no leg pain, no pelvic pain, no perianal numbness and no tingling        Past Medical History:  Diagnosis Date  . Allergy   . Anxiety   . 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 Asthma   . Depression   . GERD (gastroesophageal reflux disease)    diet controlled, no med  . History of endoscopy   . HSV infection   . IgA deficiency (HCC)   . Low vitamin B12 level   . Osteoporosis   . Ovarian cyst    TAH/BSO  . Shingles   . Thyroid dysfunction     Patient Active Problem List   Diagnosis Date Noted  . Rash and nonspecific skin eruption 11/06/2020  . Elevated LFTs 09/10/2020  . RUQ pain 07/02/2020  . Epigastric pain 07/02/2020  . Urine frequency 07/02/2020  . History of frequent upper respiratory  infection 04/15/2020  . Chronic rhinitis 04/15/2020  . Chronic bronchitis (HCC) 02/01/2019  . Migraine headache 01/31/2019  . Hyperglycemia 01/30/2019  . Chronic cough 01/02/2019  . Cardiac murmur 09/29/2016  . Pain of both shoulder joints 06/24/2016  . 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 Chronic sinusitis 01/22/2016  . Insomnia 09/16/2015  . Low back pain radiating to left leg 06/25/2014  . 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 Vitamin B12 deficiency 03/05/2010  . Esophageal reflux 03/04/2010  . IgA deficiency (HCC) 10/18/2009  . Hypothyroidism 09/05/2007  . Allergic rhinitis 09/05/2007  . Depression 10/21/2006  . Reactive airway disease 10/21/2006  . Osteopenia 10/21/2006    Past Surgical History:  Procedure Laterality Date  . broken arm     surgery for this  . BROW LIFT  1/15  . COLONOSCOPY    . THYROIDECTOMY  2000  . TOTAL ABDOMINAL HYSTERECTOMY W/ BILATERAL SALPINGOOPHORECTOMY  1999     OB History    Gravida  1   Para  1   Term      Preterm      AB      Living  1     SAB      IAB      Ectopic      Multiple  Live Births              Family History  Problem Relation Age of Onset  . COPD Father        chronic lung infections  . Colon polyps Father   . Heart attack Mother   . Osteoporosis Mother   . Lung cancer Mother   . Colon polyps Mother   . Colon cancer Neg Hx   . Esophageal cancer Neg Hx   . Stomach cancer Neg Hx   . Rectal cancer Neg Hx     Social History   Tobacco Use  . Smoking status: Former Smoker    Packs/day: 0.50    Years: 3.00    Pack years: 1.50    Types: Cigarettes    Quit date: 08/24/1968    Years since quitting: 52.3  . Smokeless tobacco: Never Used  . Tobacco comment: in college  Vaping Use  . Vaping Use: Never used  Substance Use Topics  . Alcohol use: Yes    Alcohol/week: 2.0 standard drinks    Types: 2 Standard drinks or equivalent per week  . Drug use: No     Home Medications Prior to Admission medications   Medication Sig Start Date End Date Taking? Authorizing Provider  methylPREDNISolone (MEDROL DOSEPAK) 4 MG TBPK tablet Follow package insert 12/31/20  Yes Raianna Slight, DO  albuterol (VENTOLIN HFA) 108 (90 Base) MCG/ACT inhaler Inhale 2 puffs into the lungs every 4 (four) hours as needed for wheezing or shortness of breath (chest tightness, coughing). 07/22/20   Garnet Sierras, DO  calcium-vitamin D (OSCAL WITH D) 500-200 MG-UNIT TABS tablet Take by mouth.    [provider]  cyanocobalamin (,VITAMIN B-12,) 1000 MCG/ML injection Inject 1 Ml under the skin ever 30 days. 01/04/20   Binnie Rail, MD  estrogen-methylTESTOSTERone (EST ESTROGENS-METHYLTEST HS) 0.625-1.25 MG tablet TAKE 1 TABLET BY MOUTH 3 TIMES WEEKLY 10/29/20   Megan Salon, MD  ketoconazole (NIZORAL) 2 % cream APPLY 1 APPLICATION TOPICALLY DAILY FOR 2 WEEKS 10/28/20   Binnie Rail, MD  levothyroxine (SYNTHROID) 75 MCG tablet TAKE 1 TABLET(75 MCG) BY MOUTH DAILY BEFORE BREAKFAST 01/31/20   Burns, Claudina Lick, MD  rizatriptan (MAXALT) 10 MG tablet Take 1 tablet (10 mg total) by mouth as needed for migraine. May repeat in 2 hours if needed 10/04/19   Binnie Rail, MD  sertraline (ZOLOFT) 100 MG tablet Take 2 tablets (200 mg total) by mouth daily. 07/26/20   Binnie Rail, MD  sodium chloride (OCEAN) 0.65 % SOLN nasal spray Place 1 spray into both nostrils as needed for congestion. 07/28/16   Nche, Charlene Brooke, NP  traZODone (DESYREL) 150 MG tablet TAKE 1 TABLET(150 MG) BY MOUTH AT BEDTIME 05/25/20   Burns, Claudina Lick, MD  triamcinolone (KENALOG) 0.1 % Apply 1 application topically 2 (two) times daily as needed. 11/18/20   Sheffield, Vida Roller R, PA-C  triamcinolone ointment (KENALOG) 0.5 % APPLY TOPICALLY TO THE AFFECTED AREA TWICE DAILY 11/08/20   Binnie Rail, MD  TUBERCULIN SYR 1CC/25GX5/8" (SAFETY-LOK TB SYR 1CC/25GX5/8") 25G X 5/8" 1 ML MISC USE AS DIRECTED FOR B-12 INJECTIONS. 11/06/17    Burns, Claudina Lick, MD  valACYclovir (VALTREX) 1000 MG tablet TAKE 2 TABLETS BY MOUTH EVERY 12 HOURS FOR 1 DAY AS NEEDED 02/07/20   Binnie Rail, MD  Vitamin D, Ergocalciferol, (DRISDOL) 1.25 MG (50000 UNIT) CAPS capsule 1 capsule every other week. 03/01/20   Megan Salon, MD  Allergies    Gammagard s-d less iga [immune globulin (human)], Clarithromycin, Erythromycin base, Codeine, Levaquin [levofloxacin in d5w], Other, Sulfonamide derivatives, and Tetracycline  Review of Systems   Review of Systems  Constitutional: Negative for chills and fever.  HENT: Negative for ear pain and sore throat.   Eyes: Negative for pain and visual disturbance.  Respiratory: Negative for cough and shortness of breath.   Cardiovascular: Negative for chest pain and palpitations.  Gastrointestinal: Negative for abdominal pain, bowel incontinence and vomiting.  Genitourinary: Positive for bladder incontinence (maybe). Negative for dysuria, hematuria and pelvic pain.  Musculoskeletal: Positive for back pain and gait problem. Negative for arthralgias.  Skin: Negative for color change and rash.  Neurological: Positive for weakness, numbness and paresthesias. Negative for dizziness, tingling, tremors, seizures, syncope, facial asymmetry, speech difficulty, light-headedness and headaches.  All other systems reviewed and are negative.   Physical Exam Updated Vital Signs  ED Triage Vitals  Enc Vitals Group     BP 12/31/20 1014 (!) 157/74     Pulse Rate 12/31/20 1014 70     Resp 12/31/20 1014 16     Temp 12/31/20 1014 98.1 F (36.7 C)     Temp Source 12/31/20 1014 Oral     SpO2 12/31/20 1014 100 %     Weight --      Height --      Head Circumference --      Peak Flow --      Pain Score 12/31/20 1020 10     Pain Loc --      Pain Edu? --      Excl. in [place]? --     Physical Exam Vitals and nursing note reviewed.  Constitutional:      General: She is not in acute distress.    Appearance: She is  well-developed. She is not ill-appearing.  HENT:     Head: Normocephalic and atraumatic.     Nose: Nose normal.     Mouth/Throat:     Mouth: Mucous membranes are moist.  Eyes:     Extraocular Movements: Extraocular movements intact.     Pupils: Pupils are equal, round, and reactive to light.  Cardiovascular:     Rate and Rhythm: Normal rate and regular rhythm.     Pulses: Normal pulses.     Heart sounds: Normal heart sounds. No murmur heard.   Pulmonary:     Effort: Pulmonary effort is normal. No respiratory distress.     Breath sounds: Normal breath sounds.  Abdominal:     Palpations: Abdomen is soft.     Tenderness: There is no abdominal tenderness.  Musculoskeletal:        General: Tenderness present. No swelling or deformity. Normal range of motion.     Cervical back: Normal range of motion and neck supple.     Comments: Tenderness to the right lower paraspinal lumbar muscles and right gluteal muscles  Skin:    General: Skin is warm and dry.  Neurological:     General: No focal deficit present.     Mental Status: She is alert and oriented to person, place, and time.     Cranial Nerves: No cranial nerve deficit.     Sensory: No sensory deficit.     Comments: 5+ out of 5 strength in left lower extremity, trace weakness to the right lower extremity but limited secondary to pain     ED Results / Procedures / Treatments   Labs (all labs ordered  are listed, but only abnormal results are displayed) Labs Reviewed  CBC WITH DIFFERENTIAL/PLATELET - Abnormal; Notable for the following components:      Result Value   Monocytes Absolute 1.2 (*)    Abs Immature Granulocytes 0.19 (*)    All other components within normal limits  BASIC METABOLIC PANEL - Abnormal; Notable for the following components:   Sodium 125 (*)    Chloride 91 (*)    Calcium 8.6 (*)    All other components within normal limits    EKG None  Radiology MR Lumbar Spine W Wo Contrast  Result Date:  12/31/2020 CLINICAL DATA:  Acute presentation with worsening low back pain. Tingling of the lower extremities. EXAM: MRI LUMBAR SPINE WITHOUT AND WITH CONTRAST TECHNIQUE: Multiplanar and multiecho pulse sequences of the lumbar spine were obtained without and with intravenous contrast. CONTRAST:  32mL GADAVIST GADOBUTROL 1 MMOL/ML IV SOLN COMPARISON:  09/22/2014 FINDINGS: Segmentation:  5 lumbar type vertebral bodies. Alignment:  No malalignment. Vertebrae: No fracture or primary bone lesion. Mild discogenic endplate edema and enhancement at L5-S1 could contribute to low back pain. Conus medullaris and cauda equina: Conus extends to the L1 level. Conus and cauda equina appear normal. Paraspinal and other soft tissues: Negative Disc levels: No abnormality from T11-12 through L1-2. L2-3: Minimal disc bulge.  No stenosis. L3-4: Minimal disc bulge.  No stenosis. L4-5: Broad-based disc herniation more prominent towards the left. Mild facet and ligamentous hypertrophy. Narrowing of the lateral recesses left more than right. Either L5 nerve could be affected, more likely the left. L4 nerves appear to exit the foramina without gross compression. L5-S1: Disc degeneration with loss of disc height. Shallow protrusion of the disc more prominent towards the left. Mild facet and ligamentous hypertrophy. Mild stenosis of both subarticular lateral recesses and neural foramina. IMPRESSION: L4-5: Shallow disc herniation more prominent towards the left. Facet and ligamentous hypertrophy. Stenosis of the lateral recesses left more than right. Neural compression could occur at this level, particularly on the left. This has worsened since 2016. L5-S1: Endplate osteophytes and shallow disc protrusion. Facet and ligamentous hypertrophy. Narrowing of both subarticular lateral recesses and foramina which could possibly cause neural compression, particularly in the lateral recesses. Slight worsening since 2016. Electronically Signed   By: Nelson Chimes M.D.   On: 12/31/2020 13:23    Procedures Procedures   Medications Ordered in ED Medications  dexamethasone (DECADRON) injection 10 mg (10 mg Intravenous Given 12/31/20 1150)  fentaNYL (SUBLIMAZE) injection 50 mcg (50 mcg Intravenous Given 12/31/20 1149)  diazepam (VALIUM) injection 2.5 mg (2.5 mg Intravenous Given 12/31/20 1330)  gadobutrol (GADAVIST) 1 MMOL/ML injection 5 mL (5 mLs Intravenous Contrast Given 12/31/20 1303)    ED Course  I have reviewed the triage vital signs and the nursing notes.  Pertinent labs & imaging results that were available during my care of the patient were reviewed by me and considered in my medical decision making (see chart for details).    MDM Rules/Calculators/A&P                          Jeneen Montgomery is here with low back pain.  History of chronic back pain.  Worse since epidural several days ago.  Got worse overnight.  Feels weak in the right lower leg.  May be some bladder incontinence.  She does appear to have some trace weakness in the right lower extremity however difficult to fully assess secondary  to pain.  Has normal sensation.  Overall could be a complication of the epidural versus cauda equina versus pain from chronic stenosis.  We will get an MRI to further evaluate.  We will give IV fentanyl, IV Decadron and check basic labs.  Overall lab work is unremarkable.  MRI shows disc herniation at L4-L5 and L5-S1.  There is stenosis of the lateral recesses bilaterally.  Slightly worse than 2016.  There is no evidence of compression of cauda equina.  Overall ongoing radicular pain.  She appears to have good strength and sensation when she gives good effort and overall suspect that weakness is secondary to pain but appears to be moving things fairly well at this time.  Will prescribe Medrol Dosepak and have her follow-up with her spine team.  Patient was able to ambulate without much discomfort following Valium.  Discharged in good  condition.  This chart was dictated using voice recognition software.  Despite best efforts to proofread,  errors can occur which can change the documentation meaning.   Final Clinical Impression(s) / ED Diagnoses Final diagnoses:  Acute right-sided low back pain with sciatica, sciatica laterality unspecified  Radiculopathy, unspecified spinal region    Rx / DC Orders ED Discharge Orders         Ordered    methylPREDNISolone (MEDROL DOSEPAK) 4 MG TBPK tablet        12/31/20 [place], [place], DO 12/31/20 [place], [place], DO 12/31/20 1452

## 2020-12-31 NOTE — ED Notes (Signed)
Pt transported to MRI 

## 2021-01-03 ENCOUNTER — Other Ambulatory Visit: Payer: Self-pay | Admitting: Neurological Surgery

## 2021-01-03 DIAGNOSIS — M5416 Radiculopathy, lumbar region: Secondary | ICD-10-CM

## 2021-01-06 DIAGNOSIS — M5416 Radiculopathy, lumbar region: Secondary | ICD-10-CM | POA: Diagnosis not present

## 2021-01-06 DIAGNOSIS — M4807 Spinal stenosis, lumbosacral region: Secondary | ICD-10-CM | POA: Diagnosis not present

## 2021-01-10 ENCOUNTER — Other Ambulatory Visit: Payer: Self-pay

## 2021-01-10 ENCOUNTER — Ambulatory Visit
Admission: RE | Admit: 2021-01-10 | Discharge: 2021-01-10 | Disposition: A | Discharge status: Home / Self Care | Payer: Medicare PPO | Source: Ambulatory Visit | Attending: Neurological Surgery | Admitting: Neurological Surgery

## 2021-01-10 DIAGNOSIS — M47817 Spondylosis without myelopathy or radiculopathy, lumbosacral region: Secondary | ICD-10-CM | POA: Diagnosis not present

## 2021-01-10 DIAGNOSIS — M5416 Radiculopathy, lumbar region: Secondary | ICD-10-CM

## 2021-01-10 MED ORDER — METHYLPREDNISOLONE ACETATE 40 MG/ML INJ SUSP (RADIOLOG
80.0000 mg | Freq: Once | INTRAMUSCULAR | Status: AC
  Administered 2021-01-10: 80 mg via EPIDURAL

## 2021-01-10 MED ORDER — IOPAMIDOL (ISOVUE-M 200) INJECTION 41%
1.0000 mL | Freq: Once | INTRAMUSCULAR | Status: AC
  Administered 2021-01-10: 1 mL via EPIDURAL

## 2021-01-10 NOTE — Discharge Instructions (Signed)
Post Procedure Spinal Discharge Instruction Sheet  1. You may resume a regular diet and any medications that you routinely take (including pain medications).  2. No driving day of procedure.  3. Light activity throughout the rest of the day.  Do not do any strenuous work, exercise, bending or lifting.  The day following the procedure, you can resume normal physical activity but you should refrain from exercising or physical therapy for at least three days thereafter.   Common Side Effects:   Headaches- take your usual medications as directed by your physician.  Increase your fluid intake.  Caffeinated beverages may be helpful.  Lie flat in bed until your headache resolves.   Restlessness or inability to sleep- you may have trouble sleeping for the next few days.  Ask your referring physician if you need any medication for sleep.   Facial flushing or redness- should subside within a few days.   Increased pain- a temporary increase in pain a day or two following your procedure is not unusual.  Take your pain medication as prescribed by your referring physician.   Leg cramps  Please contact our office at [phone] for the following symptoms:  Fever greater than 100 degrees.  Headaches unresolved with medication after 2-3 days.  Increased swelling, pain, or redness at injection site.   Thank you for visiting [facility] today.  

## 2021-01-13 ENCOUNTER — Encounter: Payer: Self-pay | Admitting: Internal Medicine

## 2021-01-14 NOTE — Progress Notes (Signed)
Subjective:    Patient ID: Jo Gomez, female    DOB: 1945-12-18, 75 y.o.   MRN: [id number]  HPI The patient is here for an acute visit.  Low back pain with radiation to right foot.  She is having difficulty walking - her right foot has lost the ability to push off and bend.  She can not stand tip toe.    She is seeing Dr Vertell Limber.  She has had two epidurals - the first one did nothing.  The second one was in her L4-5 on the right on 5/20. First epidural  - did not help.  2nd helped a little, but now symptoms getting worse.   Pain down right leg to foot, sometimes into groin, having right foot drop.  Pain severe at times and having difficulty walking.  Has had to crawl back from the bathroom at times.    Taking soma and gabapentin.   She has already taken 2 steroid paks w/o improvement.  She initially was not going to see Dr Vertell Limber until the third week in June but is able to see him tomorrow.   Has had recurrent nasal sores in the right nostril.  Has had to lance one in the past.  Using saline and has tried neosporin.    Medications and allergies reviewed with patient and updated if appropriate.  Patient Active Problem List   Diagnosis Date Noted  . Rash and nonspecific skin eruption 11/06/2020  . Elevated LFTs 09/10/2020  . RUQ pain 07/02/2020  . Epigastric pain 07/02/2020  . Urine frequency 07/02/2020  . History of frequent upper respiratory infection 04/15/2020  . Chronic rhinitis 04/15/2020  . Chronic bronchitis ([place]) 02/01/2019  . Migraine headache 01/31/2019  . Hyperglycemia 01/30/2019  . Chronic cough 01/02/2019  . Cardiac murmur 09/29/2016  . Pain of both shoulder joints 06/24/2016  . 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 Chronic sinusitis 01/22/2016  . Insomnia 09/16/2015  . Low back pain radiating to left leg 06/25/2014  . 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 Vitamin B12 deficiency 03/05/2010  . Esophageal reflux  03/04/2010  . 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 Hypothyroidism 09/05/2007  . Allergic rhinitis 09/05/2007  . Depression 10/21/2006  . Reactive airway disease 10/21/2006  . Osteopenia 10/21/2006    Current Outpatient Medications on File Prior to Visit  Medication Sig Dispense Refill  . albuterol (VENTOLIN HFA) 108 (90 Base) MCG/ACT inhaler Inhale 2 puffs into the lungs every 4 (four) hours as needed for wheezing or shortness of breath (chest tightness, coughing). 8 g 2  . calcium-vitamin D (OSCAL WITH D) 500-200 MG-UNIT TABS tablet Take by mouth.    . cyanocobalamin (,VITAMIN B-12,) 1000 MCG/ML injection Inject 1 Ml under the skin ever 30 days. 30 mL 0  . estrogen-methylTESTOSTERone (EST ESTROGENS-METHYLTEST HS) 0.625-1.25 MG tablet TAKE 1 TABLET BY MOUTH 3 TIMES WEEKLY 36 tablet 1  . gabapentin (NEURONTIN) 300 MG capsule Take 1 capsule by mouth 3 (three) times daily.    . hydroquinone 4 % cream Apply topically 2 (two) times daily.    Marland Kitchen levothyroxine (SYNTHROID) 75 MCG tablet TAKE 1 TABLET(75 MCG) BY MOUTH DAILY BEFORE BREAKFAST 90 tablet 3  . methocarbamol (ROBAXIN) 500 MG tablet Take 1 tablet by mouth 3 (three) times daily as needed.    . methylPREDNISolone (MEDROL DOSEPAK) 4 MG TBPK tablet Follow package insert 21 each 0  . rizatriptan (MAXALT) 10 MG tablet Take 1 tablet (  10 mg total) by mouth as needed for migraine. May repeat in 2 hours if needed 10 tablet 5  . sertraline (ZOLOFT) 100 MG tablet Take 2 tablets (200 mg total) by mouth daily. 180 tablet 3  . sodium chloride (OCEAN) 0.65 % SOLN nasal spray Place 1 spray into both nostrils as needed for congestion. 15 mL 0  . traZODone (DESYREL) 150 MG tablet TAKE 1 TABLET(150 MG) BY MOUTH AT BEDTIME 90 tablet 3  . triamcinolone (KENALOG) 0.1 % Apply 1 application topically 2 (two) times daily as needed. 80 g 3  . triamcinolone ointment (KENALOG) 0.5 % APPLY TOPICALLY TO THE AFFECTED AREA TWICE DAILY 15 g 0  . TUBERCULIN SYR 1CC/25GX5/8"  (SAFETY-LOK TB SYR 1CC/25GX5/8") 25G X 5/8" 1 ML MISC USE AS DIRECTED FOR B-12 INJECTIONS. 20 each 3  . valACYclovir (VALTREX) 1000 MG tablet TAKE 2 TABLETS BY MOUTH EVERY 12 HOURS FOR 1 DAY AS NEEDED 20 tablet 3  . Vitamin D, Ergocalciferol, (DRISDOL) 1.25 MG (50000 UNIT) CAPS capsule 1 capsule every other week. 6 capsule 4   No current facility-administered medications on file prior to visit.    Past Medical History:  Diagnosis Date  . Allergy   . Anxiety   . 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 Asthma   . Depression   . GERD (gastroesophageal reflux disease)    diet controlled, no med  . History of endoscopy   . HSV infection   . 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 Low vitamin B12 level   . Osteoporosis   . Ovarian cyst    TAH/BSO  . Shingles   . Thyroid dysfunction     Past Surgical History:  Procedure Laterality Date  . broken arm     surgery for this  . BROW LIFT  1/15  . COLONOSCOPY    . THYROIDECTOMY  2000  . TOTAL ABDOMINAL HYSTERECTOMY W/ BILATERAL SALPINGOOPHORECTOMY  1999    Social History   Socioeconomic History  . Marital status: Married    Spouse name: [name]  . Number of children: 1  . Years of education: Not on file  . Highest education level: Master's degree (e.g., MA, MS, MEng, MEd, MSW, MBA)  Occupational History    Employer: RETIRED  Tobacco Use  . Smoking status: Former Smoker    Packs/day: 0.50    Years: 3.00    Pack years: 1.50    Types: Cigarettes    Quit date: 08/24/1968    Years since quitting: 52.4  . Smokeless tobacco: Never Used  . Tobacco comment: in college  Vaping Use  . Vaping Use: Never used  Substance and Sexual Activity  . Alcohol use: Yes    Alcohol/week: 2.0 standard drinks    Types: 2 Standard drinks or equivalent per week  . Drug use: No  . Sexual activity: Yes    Partners: Female    Birth control/protection: Surgical    Comment: TAH/BSO  Other Topics Concern  . Not on file  Social History Narrative    Patient is right-handed. She lives with her wife in a split level house. She occasionally drinks coffee.   Social Determinants of Health   Financial Resource Strain: Low Risk   . Difficulty of Paying Living Expenses: Not hard at all  Food Insecurity: No Food Insecurity  . Worried About Charity fundraiser in the Last Year: Never true  . Ran Out of Food in the Last Year: Never true  Transportation Needs: No Transportation Needs  . Lack of Transportation (Medical): No  . Lack of Transportation (Non-Medical): No  Physical Activity: Sufficiently Active  . Days of Exercise per Week: 7 days  . Minutes of Exercise per Session: 30 min  Stress: No Stress Concern Present  . Feeling of Stress : Not at all  Social Connections: Unknown  . Frequency of Communication with Friends and Family: More than three times a week  . Frequency of Social Gatherings with Friends and Family: More than three times a week  . Attends Religious Services: Patient refused  . Active Member of Clubs or Organizations: Yes  . Attends Archivist Meetings: More than 4 times per year  . Marital Status: Married    Family History  Problem Relation Age of Onset  . COPD Father        chronic lung infections  . Colon polyps Father   . Heart attack Mother   . Osteoporosis Mother   . Lung cancer Mother   . Colon polyps Mother   . Colon cancer Neg Hx   . Esophageal cancer Neg Hx   . Stomach cancer Neg Hx   . Rectal cancer Neg Hx     Review of Systems  Constitutional: Negative for fever.  HENT: Negative for congestion, sinus pressure and sinus pain.        Right nostril recurrent sores  Musculoskeletal: Positive for back pain.  Neurological: Positive for weakness and numbness.       Objective:   Vitals:   01/15/21 1413  BP: 140/68  Pulse: 79  Temp: 98 F (36.7 C)  SpO2: 98%   BP Readings from Last 3 Encounters:  01/15/21 140/68  01/10/21 132/70  12/31/20 (!) 163/67   Wt Readings from Last 3  Encounters:  11/06/20 117 lb (53.1 kg)  07/22/20 116 lb (52.6 kg)  07/02/20 112 lb 12.8 oz (51.2 kg)   Body mass index is 18.88 kg/m.   Physical Exam Constitutional:      General: She is not in acute distress.    Appearance: Normal appearance. She is not ill-appearing.  HENT:     Head: Normocephalic and atraumatic.  Skin:    General: Skin is warm and dry.  Neurological:     Mental Status: She is alert.     Gait: Gait abnormal (using crutch, foot drop on right).            Assessment & Plan:    See Problem List for Assessment and Plan of chronic medical problems.    This visit occurred during the SARS-CoV-2 public health emergency.  Safety protocols were in place, including screening questions prior to the visit, additional usage of staff PPE, and extensive cleaning of exam room while observing appropriate contact time as indicated for disinfecting solutions.

## 2021-01-15 ENCOUNTER — Encounter: Payer: Self-pay | Admitting: Internal Medicine

## 2021-01-15 ENCOUNTER — Other Ambulatory Visit: Payer: Self-pay

## 2021-01-15 ENCOUNTER — Ambulatory Visit: Payer: Medicare PPO | Admitting: Internal Medicine

## 2021-01-15 DIAGNOSIS — J3489 Other specified disorders of nose and nasal sinuses: Secondary | ICD-10-CM | POA: Insufficient documentation

## 2021-01-15 DIAGNOSIS — M5416 Radiculopathy, lumbar region: Secondary | ICD-10-CM | POA: Diagnosis not present

## 2021-01-15 MED ORDER — BACTROBAN NASAL 2 % NA OINT: 1.0000 "application " | TOPICAL_OINTMENT | Freq: Two times a day (BID) | NASAL | 1 refills | Status: DC

## 2021-01-15 NOTE — Assessment & Plan Note (Signed)
Acute Right nasal passageway - recurrent bactroban ointment bid x 5 days

## 2021-01-15 NOTE — Assessment & Plan Note (Signed)
Subacute Following with neurosurgery - dr Vertell Limber Has had MRI, two injections -- still in significant pain and having difficulty walking To see Dr Vertell Limber tomorrow Continue gabapentin and soma for now

## 2021-01-15 NOTE — Patient Instructions (Signed)
   Use the ointment in the nose for 5 days as directed.

## 2021-01-16 ENCOUNTER — Encounter: Payer: Self-pay | Admitting: Internal Medicine

## 2021-01-16 DIAGNOSIS — M4807 Spinal stenosis, lumbosacral region: Secondary | ICD-10-CM | POA: Diagnosis not present

## 2021-01-16 DIAGNOSIS — M5126 Other intervertebral disc displacement, lumbar region: Secondary | ICD-10-CM | POA: Diagnosis not present

## 2021-01-16 DIAGNOSIS — M5416 Radiculopathy, lumbar region: Secondary | ICD-10-CM | POA: Diagnosis not present

## 2021-01-16 DIAGNOSIS — R030 Elevated blood-pressure reading, without diagnosis of hypertension: Secondary | ICD-10-CM | POA: Diagnosis not present

## 2021-01-17 ENCOUNTER — Other Ambulatory Visit: Payer: Self-pay | Admitting: Internal Medicine

## 2021-01-17 DIAGNOSIS — M5126 Other intervertebral disc displacement, lumbar region: Secondary | ICD-10-CM | POA: Diagnosis not present

## 2021-01-17 DIAGNOSIS — M4726 Other spondylosis with radiculopathy, lumbar region: Secondary | ICD-10-CM | POA: Diagnosis not present

## 2021-01-17 DIAGNOSIS — M4807 Spinal stenosis, lumbosacral region: Secondary | ICD-10-CM | POA: Diagnosis not present

## 2021-01-17 DIAGNOSIS — M4727 Other spondylosis with radiculopathy, lumbosacral region: Secondary | ICD-10-CM | POA: Diagnosis not present

## 2021-01-17 DIAGNOSIS — M5117 Intervertebral disc disorders with radiculopathy, lumbosacral region: Secondary | ICD-10-CM | POA: Diagnosis not present

## 2021-01-20 ENCOUNTER — Encounter: Payer: Self-pay | Admitting: Allergy

## 2021-01-22 ENCOUNTER — Ambulatory Visit: Payer: Medicare PPO | Admitting: Allergy

## 2021-02-06 ENCOUNTER — Encounter: Payer: Medicare PPO | Admitting: Internal Medicine

## 2021-02-12 ENCOUNTER — Encounter: Payer: Self-pay | Admitting: Internal Medicine

## 2021-02-12 DIAGNOSIS — L409 Psoriasis, unspecified: Secondary | ICD-10-CM | POA: Insufficient documentation

## 2021-02-12 NOTE — Progress Notes (Signed)
Subjective:    Patient ID: Jo Gomez, female    DOB: June 02, 1946, 75 y.o.   MRN: [id number]   This visit occurred during the SARS-CoV-2 public health emergency.  Safety protocols were in place, including screening questions prior to the visit, additional usage of staff PPE, and extensive cleaning of exam room while observing appropriate contact time as indicated for disinfecting solutions.    HPI She is here for a physical exam.   S/p back surgery - doing better.  Still has pain down posterior right leg.  Taking gabapentin tid, methocarbamol.  The medication is making her loopy and unsteady.  She has fallen a few times.    Medications and allergies reviewed with patient and updated if appropriate.  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   Chronic sinusitis 01/22/2016   Insomnia 09/16/2015   Low back pain radiating to left leg 06/25/2014   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 8 g 2   calcium-vitamin D (OSCAL WITH D) 500-200 MG-UNIT TABS tablet Take by mouth.     cyanocobalamin (,VITAMIN B-12,) 1000 MCG/ML injection Inject 1 Ml under the skin ever 30 days. 30 mL 0   estrogen-methylTESTOSTERone (EST ESTROGENS-METHYLTEST HS) 0.625-1.25 MG tablet TAKE 1 TABLET BY MOUTH 3 TIMES WEEKLY 36 tablet 1   gabapentin (NEURONTIN) 300 MG capsule Take 1 capsule by mouth 3 (three) times daily.     hydroquinone 4 % cream Apply topically 2 (two) times daily.     levothyroxine (SYNTHROID) 75 MCG tablet TAKE 1 TABLET(75 MCG) BY MOUTH DAILY BEFORE BREAKFAST 90 tablet 3   methocarbamol (ROBAXIN) 500 MG tablet Take 1 tablet by mouth 3 (three) times daily as needed.     mupirocin nasal ointment (BACTROBAN NASAL) 2 % Place 1 application into the nose 2 (two) times daily. Use one-half of tube in each nostril twice daily for five (5) days. After application, press sides of nose together and gently massage. 10 g 1   rizatriptan (MAXALT) 10 MG tablet Take 1 tablet (10 mg total) by mouth as needed for migraine. May repeat in 2 hours if needed 10 tablet 5   sertraline (ZOLOFT) 100 MG tablet Take 2 tablets (200 mg total) by mouth daily. 180 tablet 3   sodium chloride (OCEAN) 0.65 % SOLN nasal spray Place 1 spray into both nostrils as needed for congestion. 15 mL 0   traZODone (DESYREL) 150 MG tablet  TAKE 1 TABLET(150 MG) BY MOUTH AT BEDTIME 90 tablet 3   triamcinolone (KENALOG) 0.1 % Apply 1 application topically 2 (two) times daily as needed. 80 g 3   triamcinolone ointment (KENALOG) 0.5 % APPLY TOPICALLY TO THE AFFECTED AREA TWICE DAILY 15 g 0   TUBERCULIN SYR 1CC/25GX5/8" (SAFETY-LOK TB SYR 1CC/25GX5/8") 25G X 5/8" 1 ML MISC USE AS DIRECTED FOR B-12 INJECTIONS. 20 each 3   valACYclovir (VALTREX) 1000 MG tablet TAKE 2 TABLETS BY MOUTH EVERY 12 HOURS FOR 1 DAY AS NEEDED 20 tablet 3   Vitamin D, Ergocalciferol, (DRISDOL)  1.25 MG (50000 UNIT) CAPS capsule 1 capsule every other week. 6 capsule 4   No current facility-administered medications on file prior to visit.    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   Asthma    Depression    GERD (gastroesophageal reflux disease)    diet controlled, no med   History of endoscopy    HSV infection    IgA deficiency ([facility])    Low vitamin B12 level    Osteoporosis    Ovarian cyst    TAH/BSO   Shingles    Thyroid dysfunction     Past Surgical History:  Procedure Laterality Date   broken arm     surgery for this   BROW LIFT  1/15   COLONOSCOPY     THYROIDECTOMY  2000   TOTAL ABDOMINAL HYSTERECTOMY W/ BILATERAL SALPINGOOPHORECTOMY  1999    Social History   Socioeconomic History   Marital status: Married    Spouse name: [name]   Number of children: 1   Years of education: Not on file   Highest education level: Master's degree (e.g., MA, MS, MEng, MEd, MSW, MBA)  Occupational History    Employer: RETIRED  Tobacco Use   Smoking status: Former    Packs/day: 0.50    Years: 3.00    Pack years: 1.50    Types: Cigarettes    Quit date: 08/24/1968    Years since quitting: 52.5   Smokeless tobacco: Never   Tobacco comments:    in college  Vaping Use   Vaping Use: Never used  Substance and Sexual Activity   Alcohol use: Yes    Alcohol/week: 2.0 standard drinks    Types: 2 Standard drinks or equivalent per week   Drug use: No   Sexual activity: Yes    Partners: Female    Birth control/protection: Surgical    Comment: TAH/BSO  Other Topics Concern   Not on file  Social History Narrative   Patient is right-handed. She lives with her wife in a split level house. She occasionally drinks coffee.   Social Determinants of Health   Financial Resource Strain: Not on file  Food Insecurity: Not on file  Transportation Needs: Not on file  Physical Activity: Not on file  Stress: Not  on file  Social Connections: Not on file    Family History  Problem Relation Age of Onset   COPD Father        chronic lung infections   Colon polyps Father    Heart attack Mother    Osteoporosis Mother    Lung cancer Mother    Colon polyps Mother    Colon cancer Neg Hx    Esophageal cancer Neg Hx    Stomach cancer Neg Hx    Rectal cancer Neg Hx  Review of Systems  Constitutional:  Negative for chills and fever.  Eyes:  Negative for visual disturbance.  Respiratory:  Negative for cough, shortness of breath and wheezing.   Cardiovascular:  Negative for chest pain, palpitations and leg swelling.  Gastrointestinal:  Negative for abdominal pain, blood in stool, constipation, diarrhea and nausea.       No Gerd   Genitourinary:  Negative for dysuria and hematuria.  Musculoskeletal:  Positive for arthralgias (shoulder pain b/l).       Radiculopathy down right leg  Skin:  Positive for rash (psoriasis).  Neurological:  Positive for dizziness and light-headedness (medication related). Negative for headaches.  Psychiatric/Behavioral:  Positive for dysphoric mood (controlled). The patient is not nervous/anxious.       Objective:   Vitals:   02/13/21 1013  BP: 130/82  Pulse: 66  Temp: 97.7 F (36.5 C)  SpO2: 99%   Filed Weights   02/13/21 1013  Weight: 119 lb (54 kg)   Body mass index is 19.21 kg/m.  BP Readings from Last 3 Encounters:  02/13/21 130/82  01/15/21 140/68  01/10/21 132/70    Wt Readings from Last 3 Encounters:  02/13/21 119 lb (54 kg)  11/06/20 117 lb (53.1 kg)  07/22/20 116 lb (52.6 kg)     Physical Exam Constitutional: She appears well-developed and well-nourished. No distress.  HENT:  Head: Normocephalic and atraumatic.  Right Ear: External ear normal. Normal ear canal and TM Left Ear: External ear normal.  Normal ear canal and TM Mouth/Throat: Oropharynx is clear and moist.  Eyes: Conjunctivae and EOM are normal.  Neck: Neck supple. No  tracheal deviation present. No thyromegaly present.  No carotid bruit  Cardiovascular: Normal rate, regular rhythm and normal heart sounds.   No murmur heard.  No edema. Pulmonary/Chest: Effort normal and breath sounds normal. No respiratory distress. She has no wheezes. She has no rales.  Breast: deferred   Abdominal: Soft. She exhibits no distension. There is no tenderness.  Lymphadenopathy: She has no cervical adenopathy.  Skin: Skin is warm and dry. She is not diaphoretic.  Psychiatric: She has a normal mood and affect. Her behavior is normal.        Assessment & Plan:   Physical exam: Screening blood work    ordered Immunizations  Up to date besides shingrix Colonoscopy  Up to date  Mammogram  Up to date  Gyn  up to date Dexa   Up to date  Eye exams  up to date Exercise    as much as able Weight  normal Substance abuse   none      See Problem List for Assessment and Plan of chronic medical problems.

## 2021-02-12 NOTE — Patient Instructions (Addendum)
Blood work was ordered.     Medications changes include :   try gabapentin at a lower dose. 100 mg pills sent to pharmacy.   Your prescription(s) have been submitted to your pharmacy. Please take as directed and contact our office if you believe you are having problem(s) with the medication(s).   Please followup in 1 year   Health Maintenance, Female Adopting a healthy lifestyle and getting preventive care are important in promoting health and wellness. Ask your health care provider about: The right schedule for you to have regular tests and exams. Things you can do on your own to prevent diseases and keep yourself healthy. What should I know about diet, weight, and exercise? Eat a healthy diet  Eat a diet that includes plenty of vegetables, fruits, low-fat dairy products, and lean protein. Do not eat a lot of foods that are high in solid fats, added sugars, or sodium.  Maintain a healthy weight Body mass index (BMI) is used to identify weight problems. It estimates body fat based on height and weight. Your health care provider can help determineyour BMI and help you achieve or maintain a healthy weight. Get regular exercise Get regular exercise. This is one of the most important things you can do for your health. Most adults should: Exercise for at least 150 minutes each week. The exercise should increase your heart rate and make you sweat (moderate-intensity exercise). Do strengthening exercises at least twice a week. This is in addition to the moderate-intensity exercise. Spend less time sitting. Even light physical activity can be beneficial. Watch cholesterol and blood lipids Have your blood tested for lipids and cholesterol at 73 years of age, then havethis test every 5 years. Have your cholesterol levels checked more often if: Your lipid or cholesterol levels are high. You are older than 73 years of age. You are at high risk for heart disease. What should I know about  cancer screening? Depending on your health history and family history, you may need to have cancer screening at various ages. This may include screening for: Breast cancer. Cervical cancer. Colorectal cancer. Skin cancer. Lung cancer. What should I know about heart disease, diabetes, and high blood pressure? Blood pressure and heart disease High blood pressure causes heart disease and increases the risk of stroke. This is more likely to develop in people who have high blood pressure readings, are of African descent, or are overweight. Have your blood pressure checked: Every 3-5 years if you are 39-56 years of age. Every year if you are 39 years old or older. Diabetes Have regular diabetes screenings. This checks your fasting blood sugar level. Have the screening done: Once every three years after age 83 if you are at a normal weight and have a low risk for diabetes. More often and at a younger age if you are overweight or have a high risk for diabetes. What should I know about preventing infection? Hepatitis B If you have a higher risk for hepatitis B, you should be screened for this virus. Talk with your health care provider to find out if you are at risk forhepatitis B infection. Hepatitis C Testing is recommended for: Everyone born from [birth year] through 1963. Anyone with known risk factors for hepatitis C. Sexually transmitted infections (STIs) Get screened for STIs, including gonorrhea and chlamydia, if: You are sexually active and are younger than 73 years of age. You are older than 73 years of age and your health care provider tells you that you  are at risk for this type of infection. Your sexual activity has changed since you were last screened, and you are at increased risk for chlamydia or gonorrhea. Ask your health care provider if you are at risk. Ask your health care provider about whether you are at high risk for HIV. Your health care provider may recommend a prescription  medicine to help prevent HIV infection. If you choose to take medicine to prevent HIV, you should first get tested for HIV. You should then be tested every 3 months for as long as you are taking the medicine. Pregnancy If you are about to stop having your period (premenopausal) and you may become pregnant, seek counseling before you get pregnant. Take 400 to 800 micrograms (mcg) of folic acid every day if you become pregnant. Ask for birth control (contraception) if you want to prevent pregnancy. Osteoporosis and menopause Osteoporosis is a disease in which the bones lose minerals and strength with aging. This can result in bone fractures. If you are 3 years old or older, or if you are at risk for osteoporosis and fractures, ask your health care provider if you should: Be screened for bone loss. Take a calcium or vitamin D supplement to lower your risk of fractures. Be given hormone replacement therapy (HRT) to treat symptoms of menopause. Follow these instructions at home: Lifestyle Do not use any products that contain nicotine or tobacco, such as cigarettes, e-cigarettes, and chewing tobacco. If you need help quitting, ask your health care provider. Do not use street drugs. Do not share needles. Ask your health care provider for help if you need support or information about quitting drugs. Alcohol use Do not drink alcohol if: Your health care provider tells you not to drink. You are pregnant, may be pregnant, or are planning to become pregnant. If you drink alcohol: Limit how much you use to 0-1 drink a day. Limit intake if you are breastfeeding. Be aware of how much alcohol is in your drink. In the [place] one drink equals one 12 oz bottle of beer (355 mL), one 5 oz glass of wine (148 mL), or one 1 oz glass of hard liquor (44 mL). General instructions Schedule regular health, dental, and eye exams. Stay current with your vaccines. Tell your health care provider if: You often feel  depressed. You have ever been abused or do not feel safe at home. Summary Adopting a healthy lifestyle and getting preventive care are important in promoting health and wellness. Follow your health care provider's instructions about healthy diet, exercising, and getting tested or screened for diseases. Follow your health care provider's instructions on monitoring your cholesterol and blood pressure. This information is not intended to replace advice given to you by your health care provider. Make sure you discuss any questions you have with your healthcare provider. Document Revised: 08/03/2018 Document Reviewed: 08/03/2018 Elsevier Patient Education  2022 [employer].

## 2021-02-13 ENCOUNTER — Ambulatory Visit (INDEPENDENT_AMBULATORY_CARE_PROVIDER_SITE_OTHER): Payer: Medicare PPO | Admitting: Internal Medicine

## 2021-02-13 ENCOUNTER — Other Ambulatory Visit: Payer: Self-pay | Admitting: Internal Medicine

## 2021-02-13 ENCOUNTER — Other Ambulatory Visit: Payer: Self-pay

## 2021-02-13 VITALS — BP 130/82 | HR 66 | Temp 97.7°F | Ht 66.0 in | Wt 119.0 lb

## 2021-02-13 DIAGNOSIS — R739 Hyperglycemia, unspecified: Secondary | ICD-10-CM

## 2021-02-13 DIAGNOSIS — M8589 Other specified disorders of bone density and structure, multiple sites: Secondary | ICD-10-CM

## 2021-02-13 DIAGNOSIS — G43909 Migraine, unspecified, not intractable, without status migrainosus: Secondary | ICD-10-CM | POA: Diagnosis not present

## 2021-02-13 DIAGNOSIS — E538 Deficiency of other specified B group vitamins: Secondary | ICD-10-CM

## 2021-02-13 DIAGNOSIS — E890 Postprocedural hypothyroidism: Secondary | ICD-10-CM | POA: Diagnosis not present

## 2021-02-13 DIAGNOSIS — F3289 Other specified depressive episodes: Secondary | ICD-10-CM | POA: Diagnosis not present

## 2021-02-13 DIAGNOSIS — L409 Psoriasis, unspecified: Secondary | ICD-10-CM | POA: Diagnosis not present

## 2021-02-13 DIAGNOSIS — R1011 Right upper quadrant pain: Secondary | ICD-10-CM

## 2021-02-13 DIAGNOSIS — G4709 Other insomnia: Secondary | ICD-10-CM

## 2021-02-13 DIAGNOSIS — F32A Depression, unspecified: Secondary | ICD-10-CM

## 2021-02-13 DIAGNOSIS — Z0000 Encounter for general adult medical examination without abnormal findings: Secondary | ICD-10-CM

## 2021-02-13 DIAGNOSIS — M5416 Radiculopathy, lumbar region: Secondary | ICD-10-CM

## 2021-02-13 LAB — CBC WITH DIFFERENTIAL/PLATELET
Basophils Absolute: 0 10*3/uL (ref 0.0–0.1)
Basophils Relative: 0.3 % (ref 0.0–3.0)
Eosinophils Absolute: 0.1 10*3/uL (ref 0.0–0.7)
Eosinophils Relative: 1.2 % (ref 0.0–5.0)
HCT: 33.8 % — ABNORMAL LOW (ref 36.0–46.0)
Hemoglobin: 12.1 g/dL (ref 12.0–15.0)
Lymphocytes Relative: 14.3 % (ref 12.0–46.0)
Lymphs Abs: 1.3 10*3/uL (ref 0.7–4.0)
MCHC: 35.7 g/dL (ref 30.0–36.0)
MCV: 92.3 fl (ref 78.0–100.0)
Monocytes Absolute: 0.9 10*3/uL (ref 0.1–1.0)
Monocytes Relative: 9.6 % (ref 3.0–12.0)
Neutro Abs: 7 10*3/uL (ref 1.4–7.7)
Neutrophils Relative %: 74.6 % (ref 43.0–77.0)
Platelets: 220 10*3/uL (ref 150.0–400.0)
RBC: 3.67 Mil/uL — ABNORMAL LOW (ref 3.87–5.11)
RDW: 15.8 % — ABNORMAL HIGH (ref 11.5–15.5)
WBC: 9.4 10*3/uL (ref 4.0–10.5)

## 2021-02-13 LAB — COMPREHENSIVE METABOLIC PANEL
ALT: 29 U/L (ref 0–35)
AST: 28 U/L (ref 0–37)
Albumin: 4.1 g/dL (ref 3.5–5.2)
Alkaline Phosphatase: 61 U/L (ref 39–117)
BUN: 9 mg/dL (ref 6–23)
CO2: 29 mEq/L (ref 19–32)
Calcium: 9 mg/dL (ref 8.4–10.5)
Chloride: 96 mEq/L (ref 96–112)
Creatinine, Ser: 0.69 mg/dL (ref 0.40–1.20)
GFR: 86.37 mL/min (ref >60.00)
Glucose, Bld: 78 mg/dL (ref 70–99)
Potassium: 4 mEq/L (ref 3.5–5.1)
Sodium: 132 mEq/L — ABNORMAL LOW (ref 135–145)
Total Bilirubin: 0.5 mg/dL (ref 0.2–1.2)
Total Protein: 6.5 g/dL (ref 6.0–8.3)

## 2021-02-13 LAB — HEMOGLOBIN A1C: Hgb A1c MFr Bld: 5.5 % (ref 4.6–6.5)

## 2021-02-13 LAB — LIPID PANEL
Cholesterol: 186 mg/dL (ref 0–200)
HDL: 64.2 mg/dL (ref >39.00)
LDL Cholesterol: 110 mg/dL — ABNORMAL HIGH (ref 0–99)
NonHDL: 122.18
Total CHOL/HDL Ratio: 3
Triglycerides: 59 mg/dL (ref 0.0–149.0)
VLDL: 11.8 mg/dL (ref 0.0–40.0)

## 2021-02-13 LAB — TSH: TSH: 0.75 u[IU]/mL (ref 0.35–4.50)

## 2021-02-13 LAB — VITAMIN D 25 HYDROXY (VIT D DEFICIENCY, FRACTURES): VITD: 60.66 ng/mL (ref 30.00–100.00)

## 2021-02-13 LAB — VITAMIN B12: Vitamin B-12: 1169 pg/mL — ABNORMAL HIGH (ref 211–911)

## 2021-02-13 MED ORDER — GABAPENTIN 100 MG PO CAPS: 100.0000 mg | ORAL_CAPSULE | Freq: Three times a day (TID) | ORAL | 3 refills | Status: DC

## 2021-02-13 MED ORDER — SERTRALINE HCL 100 MG PO TABS: 150.0000 mg | ORAL_TABLET | Freq: Every day | ORAL | 3 refills | Status: DC

## 2021-02-13 NOTE — Assessment & Plan Note (Signed)
Chronic ooc migraines only Continue maxalt 10 mg prn

## 2021-02-13 NOTE — Assessment & Plan Note (Signed)
Chronic Controlled, stable Continue sertraline 150 mg daily  

## 2021-02-13 NOTE — Assessment & Plan Note (Signed)
Chronic  Clinically euthyroid Currently taking levothyroxine 75 mcg qd Check tsh  Titrate med dose if needed

## 2021-02-13 NOTE — Assessment & Plan Note (Signed)
Chronic Controlled, stable Continue trazodone 150 mg nightly

## 2021-02-13 NOTE — Assessment & Plan Note (Signed)
Resolved Possibly MSK

## 2021-02-13 NOTE — Assessment & Plan Note (Signed)
Chronic continue triamcinolone cream

## 2021-02-13 NOTE — Assessment & Plan Note (Signed)
Chronic dexa up to date Continue calcium/vitamin d daily

## 2021-02-13 NOTE — Assessment & Plan Note (Signed)
Chronic Check a1c Low sugar / carb diet Stressed regular exercise  

## 2021-02-13 NOTE — Assessment & Plan Note (Signed)
Chronic S/p surgery Still with right posterior leg nerve pain Continue methocarbamol 500 mg TID prn Taking gabapentin 300 mg TID -- this is causing side effects -- would try decreasing am and afternoon dose to 100-200 mg  As long as pain is controlled -- I gave her an rx for 100 mg gabapentin to try

## 2021-02-13 NOTE — Assessment & Plan Note (Signed)
Chronic Doing monthly B12 injections  check b12 level

## 2021-02-16 ENCOUNTER — Encounter: Payer: Self-pay | Admitting: Physician Assistant

## 2021-02-20 ENCOUNTER — Encounter: Payer: Self-pay | Admitting: Internal Medicine

## 2021-02-25 ENCOUNTER — Encounter: Payer: Self-pay | Admitting: Internal Medicine

## 2021-02-25 ENCOUNTER — Ambulatory Visit: Payer: Medicare PPO | Admitting: Family

## 2021-02-26 MED ORDER — DULOXETINE HCL 30 MG PO CPEP: 30.0000 mg | ORAL_CAPSULE | Freq: Every day | ORAL | 5 refills | Status: DC

## 2021-03-08 ENCOUNTER — Other Ambulatory Visit: Payer: Self-pay | Admitting: Physician Assistant

## 2021-03-12 DIAGNOSIS — M545 Low back pain, unspecified: Secondary | ICD-10-CM | POA: Diagnosis not present

## 2021-03-12 DIAGNOSIS — R030 Elevated blood-pressure reading, without diagnosis of hypertension: Secondary | ICD-10-CM | POA: Diagnosis not present

## 2021-03-12 DIAGNOSIS — M5416 Radiculopathy, lumbar region: Secondary | ICD-10-CM | POA: Diagnosis not present

## 2021-03-12 DIAGNOSIS — M5126 Other intervertebral disc displacement, lumbar region: Secondary | ICD-10-CM | POA: Diagnosis not present

## 2021-03-24 ENCOUNTER — Ambulatory Visit: Payer: Medicare PPO | Admitting: Physician Assistant

## 2021-03-24 DIAGNOSIS — H2513 Age-related nuclear cataract, bilateral: Secondary | ICD-10-CM | POA: Diagnosis not present

## 2021-03-24 DIAGNOSIS — H353131 Nonexudative age-related macular degeneration, bilateral, early dry stage: Secondary | ICD-10-CM | POA: Diagnosis not present

## 2021-03-25 ENCOUNTER — Ambulatory Visit: Payer: Medicare PPO | Admitting: Physician Assistant

## 2021-03-25 ENCOUNTER — Other Ambulatory Visit: Payer: Self-pay

## 2021-03-29 ENCOUNTER — Other Ambulatory Visit: Payer: Self-pay | Admitting: Internal Medicine

## 2021-03-31 ENCOUNTER — Other Ambulatory Visit: Payer: Self-pay

## 2021-03-31 ENCOUNTER — Encounter: Payer: Self-pay | Admitting: Allergy

## 2021-03-31 ENCOUNTER — Ambulatory Visit: Payer: Medicare PPO | Admitting: Allergy

## 2021-03-31 VITALS — BP 134/78 | HR 72 | Ht 66.0 in | Wt 119.0 lb

## 2021-03-31 DIAGNOSIS — R12 Heartburn: Secondary | ICD-10-CM | POA: Diagnosis not present

## 2021-03-31 DIAGNOSIS — D802 Selective deficiency of immunoglobulin A [IgA]: Secondary | ICD-10-CM

## 2021-03-31 DIAGNOSIS — J4530 Mild persistent asthma, uncomplicated: Secondary | ICD-10-CM | POA: Diagnosis not present

## 2021-03-31 DIAGNOSIS — J310 Chronic rhinitis: Secondary | ICD-10-CM | POA: Diagnosis not present

## 2021-03-31 DIAGNOSIS — L409 Psoriasis, unspecified: Secondary | ICD-10-CM

## 2021-03-31 MED ORDER — FLUTICASONE PROPIONATE HFA 110 MCG/ACT IN AERO
2.0000 | INHALATION_SPRAY | Freq: Every day | RESPIRATORY_TRACT | 3 refills | Status: DC
  Filled 2021-11-19: qty 12, 60d supply, fill #0

## 2021-03-31 NOTE — Assessment & Plan Note (Signed)
Biopsy of rash on legs showed psoriasis. Topical triamcinolone 0.5% ointment not effective.  . Continue to follow up with dermatology regarding this.

## 2021-03-31 NOTE — Assessment & Plan Note (Signed)
Past history - Frequent sinus infections requiring antibiotics. Environmental allergy panel was negative.  Interim history - stable.  . Use Flonase (fluticasone) nasal spray 1 spray per nostril twice a day as needed for nasal congestion.  . Nasal saline spray (i.e., Simply Saline) or nasal saline lavage (i.e., NeilMed) is recommended as needed and prior to medicated nasal sprays.

## 2021-03-31 NOTE — Patient Instructions (Addendum)
Frequent infections:  Keep track of infections. Usually need at least 14-21 days worth of antibiotics with infections.  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 There is a small risk of allergic reaction Recommend getting the annual flu vaccine.   Breathing: Daily controller medication(s): START Flovent 118mg 2 puffs once a day and rinse mouth after each use.  May use albuterol rescue inhaler 2 puffs every 4 to 6 hours as needed for shortness of breath, chest tightness, coughing, and wheezing. May use albuterol rescue inhaler 2 puffs 5 to 15 minutes prior to strenuous physical activities. Monitor frequency of use.  Breathing control goals:  Full participation in all desired activities (may need albuterol before activity) Albuterol use two times or less a week on average (not counting use with activity) Cough interfering with sleep two times or less a month Oral steroids no more than once a year No hospitalizations  Chronic rhinitis Use Flonase (fluticasone) nasal spray 1 spray per nostril twice a day as needed for nasal congestion.  Nasal saline spray (i.e., Simply Saline) or nasal saline lavage (i.e., NeilMed) is recommended as needed and prior to medicated nasal sprays.  Skin: Continue to follow up with dermatology regarding this.  Heartburn: See handout for lifestyle and dietary modifications.  Follow up in 3 months or sooner if needed.

## 2021-03-31 NOTE — Assessment & Plan Note (Signed)
Past history - Respiratory symptoms with URIs mainly for the past 10 years. Uses albuterol in these situations with good benefit. 2021 spirometry was normal. Interim history - using albuterol mainly in the mornings due to coughing with phlegm.  Today's spirometry showed: possible restrictive disease with 14% improvement in FEV1 post bronchodilator treatment. Clinically feeling improved.  . Will do a trial of daily ICS inhaler.  . Daily controller medication(s): START Flovent 125mg 2 puffs once a day and rinse mouth after each use.  . May use albuterol rescue inhaler 2 puffs every 4 to 6 hours as needed for shortness of breath, chest tightness, coughing, and wheezing. May use albuterol rescue inhaler 2 puffs 5 to 15 minutes prior to strenuous physical activities. Monitor frequency of use.   Get spirometry at next visit.

## 2021-03-31 NOTE — Progress Notes (Signed)
Follow Up Note  RE: Jo Gomez MRN: [id number] DOB: 04-15-1946 Date of Office Visit: 03/31/2021  Referring provider: Binnie Rail, MD Primary care provider: Binnie Rail, MD  Chief Complaint: Follow-up  History of Present Illness: I had the pleasure of seeing Jo Gomez for a follow up visit at the [facility] on 03/31/2021. She is a 75 y.o. female, who is being followed for IgA deficiency, chronic non-allergic rhinitis, reactive airway disease. Her previous allergy office visit was on 07/22/2020 with Dr. Maudie Mercury. Today is a regular follow up visit.  IgA deficiency No respiratory infections since the last visit.  She had a bladder infection and took antibiotics for this.   Patient is up to date with Covid-19 vaccinations including 2 booster vaccine. No prior Covid-19 infections.   Questioning whether she would benefit from Evusheld.   Chronic rhinitis Occasional symptoms and using Flonase as needed with good benefit.    Reactive airway disease Sometimes has coughing and brings up some phlegm especially in the mornings.  Only using albuterol occasionally - no use the last 1 month.  Denies any ER/urgent care visits or prednisone use since the last visit.  Reflux is in control.   Skin Patient saw dermatology for psoriasis on the legs and was given a topical triamcinolone 0.5% with no benefit. Patient did get skin biopsy which showed psoriasis.    Patient had back surgery for a ruptured disc.   Assessment and Plan: Jo Gomez is a 75 y.o. 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 Uses albuterol in these situations with good benefit. 2021 spirometry was normal. Interim history - using albuterol mainly in the mornings due to coughing with phlegm. Today's spirometry showed: possible restrictive disease with 14% improvement in FEV1 post bronchodilator treatment.  Clinically feeling improved.  Will do a trial of daily ICS inhaler.  Daily controller medication(s): START Flovent 16mg 2 puffs once a day and rinse mouth after each use.  May use albuterol rescue inhaler 2 puffs every 4 to 6 hours as needed for shortness of breath, chest tightness, coughing, and wheezing. May use albuterol rescue inhaler 2 puffs 5 to 15 minutes prior to strenuous physical activities. Monitor frequency of use.  Get spirometry at next visit.  IgA deficiency ([facility] Past [facility] Father had IgA deficiency as well. Patient has issues with frequent sinopulmonary infections requiring prolonged courses of antibiotics. 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 Normal IgG and IgM; low IgA, pneumococcal titers adequate, good response to Tdap. Interim history - no sinopulmonary infections. Up to date with Covid-19 vaccinations + 2 boosters. Keep track of infections. Usually need at least 14-21 days worth of antibiotics with infections.  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 There is a small risk of allergic reaction Recommend getting the annual flu vaccine.  No indication for Evusheld at this time.  Nonallergic rhinitis Past history - Frequent sinus infections requiring antibiotics. Environmental allergy panel was negative.  Interim history - stable.  Use Flonase (fluticasone) nasal spray 1 spray per nostril twice a day as needed for nasal congestion.  Nasal saline spray (i.e., Simply Saline) or nasal saline lavage (i.e., NeilMed) is recommended as needed and prior to medicated nasal sprays.  Heartburn See handout for lifestyle and dietary modifications.  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 Topical triamcinolone 0.5% ointment not effective.  Continue to follow up with dermatology regarding this.  Return in about 3 months (around 07/01/2021).  Meds ordered  this encounter  Medications   fluticasone (FLOVENT HFA) 110 MCG/ACT inhaler    Sig: Inhale 2 puffs into the lungs daily. And rinse mouth after each use.    Dispense:  1 each    Refill:  5    Lab Orders  No laboratory test(s) ordered today    Diagnostics: Spirometry:  Tracings reviewed. Her effort: Good reproducible efforts. FVC: 1.81L FEV1: 1.46L, 61% predicted FEV1/FVC ratio: 81% Interpretation: Spirometry consistent with possible restrictive disease with 14% improvement in FEV1 post bronchodilator treatment. Clinically feeling improved.   Please see scanned spirometry results for details.  Medication List:  Current Outpatient Medications  Medication Sig Dispense Refill   calcium-vitamin D (OSCAL WITH D) 500-200 MG-UNIT TABS tablet Take by mouth.     cyanocobalamin (,VITAMIN B-12,) 1000 MCG/ML injection INJECT 1 ML UNDER THE SKIN EVERY 30 DAYS 30 mL 0   estrogen-methylTESTOSTERone (EST ESTROGENS-METHYLTEST HS) 0.625-1.25 MG tablet TAKE 1 TABLET BY MOUTH 3 TIMES WEEKLY 36 tablet 1   fluticasone (FLOVENT HFA) 110 MCG/ACT inhaler Inhale 2 puffs into the lungs daily. And rinse mouth after each use. 1 each 5   gabapentin (NEURONTIN) 300 MG capsule Take 1 capsule by mouth at bedtime.     levothyroxine (SYNTHROID) 75 MCG tablet TAKE 1 TABLET(75 MCG) BY MOUTH DAILY BEFORE BREAKFAST 90 tablet 3   methocarbamol (ROBAXIN) 500 MG tablet Take 500 mg by mouth every 8 (eight) hours as needed for muscle spasms.     rizatriptan (MAXALT) 10 MG tablet Take 1 tablet (10 mg total) by mouth as needed for migraine. May repeat in 2 hours if needed 10 tablet 5   sodium chloride (OCEAN) 0.65 % SOLN nasal spray Place 1 spray into both nostrils as needed for congestion. 15 mL 0   traZODone (DESYREL) 150 MG tablet TAKE 1 TABLET(150 MG) BY MOUTH AT BEDTIME 90 tablet 3   triamcinolone ointment (KENALOG) 0.5 % APPLY TOPICALLY TO THE AFFECTED AREA TWICE DAILY 15 g 0   TUBERCULIN SYR 1CC/25GX5/8" (SAFETY-LOK TB  SYR 1CC/25GX5/8") 25G X 5/8" 1 ML MISC USE AS DIRECTED FOR B-12 INJECTIONS. 20 each 3   valACYclovir (VALTREX) 1000 MG tablet TAKE 2 TABLETS BY MOUTH EVERY 12 HOURS FOR 1 DAY AS NEEDED 20 tablet 3   Vitamin D, Ergocalciferol, (DRISDOL) 1.25 MG (50000 UNIT) CAPS capsule 1 capsule every other week. 6 capsule 4   albuterol (VENTOLIN HFA) 108 (90 Base) MCG/ACT inhaler Inhale 2 puffs into the lungs every 4 (four) hours as needed for wheezing or shortness of breath (chest tightness, coughing). (Patient not taking: Reported on 03/31/2021) 8 g 2   DULoxetine (CYMBALTA) 30 MG capsule Take 1 capsule (30 mg total) by mouth daily. (Patient not taking: Reported on 03/31/2021) 30 capsule 5   gabapentin (NEURONTIN) 100 MG capsule Take 1-3 capsules (100-300 mg total) by mouth 3 (three) times daily. (Patient not taking: Reported on 03/31/2021) 120 capsule 3   No current facility-administered medications for this visit.   Allergies: Allergies  Allergen Reactions   Gammagard S-D Less Iga [Immune Globulin (Human)] Anaphylaxis    Have to have triple washed blood products due to this allergy pt states   Clarithromycin Other (See Comments)    Severe leg and muscle cramps   Erythromycin Base Nausea And Vomiting  Codeine Rash    Syrup only, can take hydrocodone, oxycodone   Levaquin [Levofloxacin In D5w] Other (See Comments)    tendonitis   Other Rash and Other (See Comments)    IGA Deficiency - catches a lot of viruses   Sulfonamide Derivatives Rash   Tetracycline Rash   I reviewed her past medical history, social history, family history, and environmental history and no significant changes have been reported from her previous visit.  Review of Systems  Constitutional:  Negative for appetite change, chills, fever and unexpected weight change.  HENT:  Negative for congestion and rhinorrhea.   Eyes:  Negative for itching.  Respiratory:  Positive for cough. Negative for chest tightness, shortness of breath and  wheezing.   Cardiovascular:  Negative for chest pain.  Gastrointestinal:  Negative for abdominal pain.  Genitourinary:  Negative for difficulty urinating.  Skin:  Positive for rash.  Neurological:  Negative for headaches.   Objective: BP 134/78   Pulse 72   Ht '5\' 6"'$  (1.676 m)   Wt 119 lb (54 kg)   LMP 08/24/1997   SpO2 97%   BMI 19.21 kg/m  Body mass index is 19.21 kg/m. Physical Exam Vitals and nursing note reviewed.  Constitutional:      Appearance: Normal appearance. She is well-developed.  HENT:     Head: Normocephalic and atraumatic.     Right Ear: Tympanic membrane and external ear normal.     Left Ear: Tympanic membrane and external ear normal.     Nose: Nose normal.     Mouth/Throat:     Mouth: Mucous membranes are moist.     Pharynx: Oropharynx is clear.  Eyes:     Conjunctiva/sclera: Conjunctivae normal.  Cardiovascular:     Rate and Rhythm: Normal rate and regular rhythm.     Heart sounds: Normal heart sounds. No murmur heard.   No friction rub. No gallop.  Pulmonary:     Effort: Pulmonary effort is normal.     Breath sounds: Normal breath sounds. No wheezing, rhonchi or rales.  Musculoskeletal:     Cervical back: Neck supple.  Skin:    General: Skin is warm.     Findings: Rash present.     Comments: Erythematous, rough, circular rashes on lower extremities b/l with some whitish plaques. Fewer similar rash on upper extremities b/l.  Neurological:     Mental Status: She is alert and oriented to person, place, and time.  Psychiatric:        Behavior: Behavior normal.  Previous notes and tests were reviewed. The plan was reviewed with the patient/family, and all questions/concerned were addressed.  It was my pleasure to see Lucindia today and participate in her care. Please feel free to contact me with any questions or concerns.  Sincerely,  Rexene Alberts, DO Allergy & Immunology  [facility] office:  [place] office: [phone]

## 2021-03-31 NOTE — Assessment & Plan Note (Signed)
   See handout for lifestyle and dietary modifications.

## 2021-03-31 NOTE — Assessment & Plan Note (Signed)
Past history - Patient was diagnosed with IgA deficiency at [facility] over 10 years ago. Father had IgA deficiency as well. Patient has issues with frequent sinopulmonary infections requiring prolonged courses of antibiotics. 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 Normal IgG and IgM; low IgA, pneumococcal titers adequate, good response to Tdap. Interim history - no sinopulmonary infections. Up to date with Covid-19 vaccinations + 2 boosters.  Keep track of infections.  Usually need at least 14-21 days worth of antibiotics with infections.   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 There is a small risk of allergic reaction  Recommend getting the annual flu vaccine.   No indication for Evusheld at this time.

## 2021-04-04 ENCOUNTER — Other Ambulatory Visit: Payer: Self-pay | Admitting: Internal Medicine

## 2021-04-08 ENCOUNTER — Ambulatory Visit: Payer: Medicare PPO | Admitting: Dermatology

## 2021-04-08 ENCOUNTER — Encounter: Payer: Self-pay | Admitting: Dermatology

## 2021-04-08 ENCOUNTER — Other Ambulatory Visit: Payer: Self-pay

## 2021-04-08 DIAGNOSIS — L409 Psoriasis, unspecified: Secondary | ICD-10-CM

## 2021-04-08 MED ORDER — CLOBETASOL PROPIONATE 0.05 % EX FOAM: CUTANEOUS | 2 refills | Status: DC

## 2021-04-08 NOTE — Patient Instructions (Addendum)
[email or web address] to look up the injection treatments   Show this FREE discount to your pharmacist at [facility] clobetasol 1 can of foam 50g of 0.05% [employer] LIMITED TIME OFFER Save $10 the first time you use this coupon $ 23.07 - $ 30.07 $33.07 - $40.07 BIN PCN Group Member ID [id number] [place] [id number]

## 2021-04-09 ENCOUNTER — Encounter: Payer: Self-pay | Admitting: Dermatology

## 2021-04-15 ENCOUNTER — Ambulatory Visit: Payer: Medicare PPO

## 2021-04-16 NOTE — Progress Notes (Signed)
Subjective:    Patient ID: Jo Gomez, female    DOB: 05-28-1946, 75 y.o.   MRN: [id number]  HPI The patient is here for an acute visit.  Psoriasis - diagnosed by dermatology - treatments discussed - topical treatments, light therapy, biologics.  He recommended that she read about Biologics and she is unsure if that something that she really needs or should be on.   Having increased exhaustion, achy - hurt everywhere.  Feels like a combination of joints and muscles.  This started a while ago, but has gotten worse.    She has coldness in her extremities.   Ibuprofen helps some.    Medications and allergies reviewed with patient and updated if appropriate.  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   Chronic sinusitis 01/22/2016   Insomnia 09/16/2015   Low back pain radiating to left leg 06/25/2014   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 30 capsule 5   estrogen-methylTESTOSTERone (EST ESTROGENS-METHYLTEST HS) 0.625-1.25 MG tablet TAKE 1 TABLET BY MOUTH 3 TIMES WEEKLY 36 tablet 1   fluticasone (FLOVENT HFA) 110 MCG/ACT inhaler Inhale 2 puffs into the lungs daily. And rinse mouth after each use. 1 each 5   levothyroxine (SYNTHROID) 75 MCG tablet TAKE 1 TABLET(75 MCG) BY MOUTH DAILY BEFORE BREAKFAST 90 tablet 3   rizatriptan (MAXALT) 10 MG tablet Take 1 tablet (10 mg total) by mouth as needed for migraine. May repeat in 2 hours if needed 10 tablet 5   sodium chloride (OCEAN) 0.65 % SOLN nasal spray Place 1 spray into both nostrils as needed for congestion. 15 mL 0   traZODone (DESYREL) 150 MG tablet TAKE 1 TABLET(150 MG) BY MOUTH AT BEDTIME 90 tablet 3   TUBERCULIN SYR 1CC/25GX5/8" (SAFETY-LOK TB SYR 1CC/25GX5/8") 25G X 5/8" 1 ML MISC USE AS DIRECTED FOR B-12 INJECTIONS. 20 each 3   valACYclovir (VALTREX) 1000 MG tablet TAKE 2 TABLETS BY MOUTH EVERY 12 HOURS FOR 1 DAY AS NEEDED 20 tablet 3   No current facility-administered medications on file prior to visit.    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   Asthma    Depression    GERD (gastroesophageal reflux disease)  diet controlled, no med   History of endoscopy    HSV infection    IgA deficiency (HCC)    Low vitamin B12 level    Osteoporosis    Ovarian cyst    TAH/BSO   Shingles    Thyroid dysfunction     Past Surgical History:  Procedure Laterality Date   broken arm     surgery for this   BROW LIFT  1/15   COLONOSCOPY     THYROIDECTOMY  2000   TOTAL ABDOMINAL HYSTERECTOMY W/ BILATERAL SALPINGOOPHORECTOMY  1999    Social History   Socioeconomic History   Marital status: Married    Spouse name: [name]   Number of children: 1   Years of education: Not on file   Highest  education level: Master's degree (e.g., MA, MS, MEng, MEd, MSW, MBA)  Occupational History    Employer: RETIRED  Tobacco Use   Smoking status: Former    Packs/day: 0.50    Years: 3.00    Pack years: 1.50    Types: Cigarettes    Quit date: 08/24/1968    Years since quitting: 52.6   Smokeless tobacco: Never   Tobacco comments:    in college  Vaping Use   Vaping Use: Never used  Substance and Sexual Activity   Alcohol use: Yes    Alcohol/week: 2.0 standard drinks    Types: 2 Standard drinks or equivalent per week   Drug use: No   Sexual activity: Yes    Partners: Female    Birth control/protection: Surgical    Comment: TAH/BSO  Other Topics Concern   Not on file  Social History Narrative   Patient is right-handed. She lives with her wife in a split level house. She occasionally drinks coffee.   Social Determinants of Health   Financial Resource Strain: Low Risk    Difficulty of Paying Living Expenses: Not hard at all  Food Insecurity: No Food Insecurity   Worried About Charity fundraiser in the Last Year: Never true   [place] in the Last Year: Never true  Transportation Needs: No Transportation Needs   Lack of Transportation (Medical): No   Lack of Transportation (Non-Medical): No  Physical Activity: Sufficiently Active   Days of Exercise per Week: 5 days   Minutes of Exercise per Session: 30 min  Stress: No Stress Concern Present   Feeling of Stress : Not at all  Social Connections: Moderately Integrated   Frequency of Communication with Friends and Family: More than three times a week   Frequency of Social Gatherings with Friends and Family: Three times a week   Attends Religious Services: Never   Active Member of Clubs or Organizations: Yes   Attends Archivist Meetings: Never   Marital Status: Married    Family History  Problem Relation Age of Onset   COPD Father        chronic lung infections   Colon polyps Father    Heart attack Mother     Osteoporosis Mother    Lung cancer Mother    Colon polyps Mother    Colon cancer Neg Hx    Esophageal cancer Neg Hx    Stomach cancer Neg Hx    Rectal cancer Neg Hx     Review of Systems  Constitutional:  Positive for fatigue. Negative for fever.  Musculoskeletal:  Positive for arthralgias and myalgias. Negative for joint swelling.  Neurological:  Negative for headaches.  Objective:   Vitals:   04/17/21 0946  BP: 120/60  Pulse: 75  SpO2: 98%   BP Readings from Last 3 Encounters:  04/17/21 120/60  04/17/21 120/60  03/31/21 134/78   Wt Readings from Last 3 Encounters:  04/17/21 117 lb 9.6 oz (53.3 kg)  04/17/21 117 lb 9.6 oz (53.3 kg)  03/31/21 119 lb (54 kg)   Body mass index is 18.98 kg/m.   Physical Exam Constitutional:      General: She is not in acute distress.    Appearance: Normal appearance. She is not ill-appearing.  Musculoskeletal:     Comments: No obvious joint deformities or joint swelling  Skin:    General: Skin is warm and dry.     Findings: Rash (Areas of psoriasis visible bilateral anterior lower extremities) present.  Neurological:     Mental Status: She is alert.           Assessment & Plan:    See Problem List for Assessment and Plan of chronic medical problems.    This visit occurred during the SARS-CoV-2 public health emergency.  Safety protocols were in place, including screening questions prior to the visit, additional usage of staff PPE, and extensive cleaning of exam room while observing appropriate contact time as indicated for disinfecting solutions.

## 2021-04-17 ENCOUNTER — Other Ambulatory Visit: Payer: Self-pay

## 2021-04-17 ENCOUNTER — Other Ambulatory Visit: Payer: Self-pay | Admitting: Obstetrics & Gynecology

## 2021-04-17 ENCOUNTER — Other Ambulatory Visit: Payer: Self-pay | Admitting: Internal Medicine

## 2021-04-17 ENCOUNTER — Ambulatory Visit: Payer: Medicare PPO | Admitting: Internal Medicine

## 2021-04-17 ENCOUNTER — Encounter: Payer: Self-pay | Admitting: Internal Medicine

## 2021-04-17 ENCOUNTER — Ambulatory Visit (INDEPENDENT_AMBULATORY_CARE_PROVIDER_SITE_OTHER): Payer: Medicare PPO

## 2021-04-17 VITALS — BP 120/60 | HR 75 | Ht 66.0 in | Wt 117.6 lb

## 2021-04-17 VITALS — BP 120/60 | HR 75 | Resp 16 | Ht 66.0 in | Wt 117.6 lb

## 2021-04-17 DIAGNOSIS — M2550 Pain in unspecified joint: Secondary | ICD-10-CM | POA: Diagnosis not present

## 2021-04-17 DIAGNOSIS — Z0000 Encounter for general adult medical examination without abnormal findings: Secondary | ICD-10-CM

## 2021-04-17 DIAGNOSIS — M791 Myalgia, unspecified site: Secondary | ICD-10-CM

## 2021-04-17 DIAGNOSIS — L409 Psoriasis, unspecified: Secondary | ICD-10-CM | POA: Diagnosis not present

## 2021-04-17 NOTE — Progress Notes (Signed)
Subjective:   Jo Gomez is a 75 y.o. female who presents for Medicare Annual (Subsequent) preventive examination.  Review of Systems     Cardiac Risk Factors include: advanced age (>63mn, >>27women);family history of premature cardiovascular disease     Objective:    Today's Vitals   04/17/21 0831  BP: 120/60  Pulse: 75  Resp: 16  SpO2: 98%  Weight: 117 lb 9.6 oz (53.3 kg)  Height: '5\' 6"'$  (1.676 m)   Body mass index is 18.98 kg/m.  Advanced Directives 04/17/2021 02/06/2020 05/13/2016 11/01/2014  Does Patient Have a Medical Advance Directive? Yes Yes Yes No  Type of Advance Directive Living will;Healthcare Power of Attorney Living will Living will -  Does patient want to make changes to medical advance directive? No - Patient declined No - Patient declined - -  Copy of [place]? No - copy requested - - -    Current Medications (verified) Outpatient Encounter Medications as of 04/17/2021  Medication Sig   clobetasol (OLUX) 0.05 % topical foam Apply to AA once to twice daily   cyanocobalamin (,VITAMIN B-12,) 1000 MCG/ML injection INJECT 1 ML UNDER THE SKIN EVERY 30 DAYS   DULoxetine (CYMBALTA) 30 MG capsule Take 1 capsule (30 mg total) by mouth daily.   estrogen-methylTESTOSTERone (EST ESTROGENS-METHYLTEST HS) 0.625-1.25 MG tablet TAKE 1 TABLET BY MOUTH 3 TIMES WEEKLY   fluticasone (FLOVENT HFA) 110 MCG/ACT inhaler Inhale 2 puffs into the lungs daily. And rinse mouth after each use.   levothyroxine (SYNTHROID) 75 MCG tablet TAKE 1 TABLET(75 MCG) BY MOUTH DAILY BEFORE BREAKFAST   rizatriptan (MAXALT) 10 MG tablet Take 1 tablet (10 mg total) by mouth as needed for migraine. May repeat in 2 hours if needed   sodium chloride (OCEAN) 0.65 % SOLN nasal spray Place 1 spray into both nostrils as needed for congestion.   traZODone (DESYREL) 150 MG tablet TAKE 1 TABLET(150 MG) BY MOUTH AT BEDTIME   TUBERCULIN SYR 1CC/25GX5/8" (SAFETY-LOK TB SYR  1CC/25GX5/8") 25G X 5/8" 1 ML MISC USE AS DIRECTED FOR B-12 INJECTIONS.   valACYclovir (VALTREX) 1000 MG tablet TAKE 2 TABLETS BY MOUTH EVERY 12 HOURS FOR 1 DAY AS NEEDED   Vitamin D, Ergocalciferol, (DRISDOL) 1.25 MG (50000 UNIT) CAPS capsule 1 capsule every other week.   albuterol (VENTOLIN HFA) 108 (90 Base) MCG/ACT inhaler Inhale 2 puffs into the lungs every 4 (four) hours as needed for wheezing or shortness of breath (chest tightness, coughing). (Patient not taking: Reported on 03/31/2021)   No facility-administered encounter medications on file as of 04/17/2021.    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   Asthma    Depression    GERD (gastroesophageal reflux disease)    diet controlled, no med   History of endoscopy    HSV infection    IgA deficiency (HCC)    Low vitamin B12 level    Osteoporosis    Ovarian cyst    TAH/BSO   Shingles    Thyroid dysfunction    Past Surgical History:  Procedure Laterality Date   broken arm     surgery for this   BROW LIFT  1/15   COLONOSCOPY     THYROIDECTOMY  2000  TOTAL ABDOMINAL HYSTERECTOMY W/ BILATERAL SALPINGOOPHORECTOMY  1999   Family History  Problem Relation Age of Onset   COPD Father        chronic lung infections   Colon polyps Father    Heart attack Mother    Osteoporosis Mother    Lung cancer Mother    Colon polyps Mother    Colon cancer Neg Hx    Esophageal cancer Neg Hx    Stomach cancer Neg Hx    Rectal cancer Neg Hx    Social History   Socioeconomic History   Marital status: Married    Spouse name: [name]   Number of children: 1   Years of education: Not on file   Highest education level: Master's degree (e.g., MA, MS, MEng, MEd,  MSW, MBA)  Occupational History    Employer: RETIRED  Tobacco Use   Smoking status: Former    Packs/day: 0.50    Years: 3.00    Pack years: 1.50    Types: Cigarettes    Quit date: 08/24/1968    Years since quitting: 52.6   Smokeless tobacco: Never   Tobacco comments:    in college  Vaping Use   Vaping Use: Never used  Substance and Sexual Activity   Alcohol use: Yes    Alcohol/week: 2.0 standard drinks    Types: 2 Standard drinks or equivalent per week   Drug use: No   Sexual activity: Yes    Partners: Female    Birth control/protection: Surgical    Comment: TAH/BSO  Other Topics Concern   Not on file  Social History Narrative   Patient is right-handed. She lives with her wife in a split level house. She occasionally drinks coffee.   Social Determinants of Health   Financial Resource Strain: Low Risk    Difficulty of Paying Living Expenses: Not hard at all  Food Insecurity: No Food Insecurity   Worried About Charity fundraiser in the Last Year: Never true   [place] in the Last Year: Never true  Transportation Needs: No Transportation Needs   Lack of Transportation (Medical): No   Lack of Transportation (Non-Medical): No  Physical Activity: Sufficiently Active   Days of Exercise per Week: 5 days   Minutes of Exercise per Session: 30 min  Stress: No Stress Concern Present   Feeling of Stress : Not at all  Social Connections: Moderately Integrated   Frequency of Communication with Friends and Family: More than three times a week   Frequency of Social Gatherings with Friends and Family: Three times a week   Attends Religious Services: Never   Active Member of Clubs or Organizations: Yes   Attends Archivist Meetings: Never   Marital Status: Married    Tobacco Counseling Counseling given: Not Answered Tobacco comments: in college   Clinical Intake:  Pre-visit preparation completed: Yes  Pain : No/denies pain     BMI - recorded:  18.98 Nutritional Status: BMI <19  Underweight Nutritional Risks: None Diabetes: No  How often do you need to have someone help you when you read instructions, pamphlets, or other written materials from your doctor or pharmacy?: 1 - Never What is the last grade level you completed in school?: Master's Degree  Diabetic? no  Interpreter Needed?: No  Information entered by :: Lisette Abu, LPN   Activities of Daily Living In your present state of health, do you have any difficulty performing the following activities: 04/17/2021 02/13/2021  Hearing?  N N  Vision? N Y  Difficulty concentrating or making decisions? N Y  Walking or climbing stairs? N Y  Dressing or bathing? N N  Doing errands, shopping? N Y  Conservation officer, nature and eating ? N -  Using the Toilet? N -  In the past six months, have you accidently leaked urine? N -  Do you have problems with loss of bowel control? N -  Managing your Medications? N -  Managing your Finances? N -  Housekeeping or managing your Housekeeping? N -  Some recent data might be hidden    Patient Care Team: Binnie Rail, MD as PCP - General (Internal Medicine) [facility] Megan Salon, MD as Consulting Physician (Gynecology) Warren Danes, PA-C as Physician Assistant (Dermatology) Warden Fillers, MD as Consulting Physician (Ophthalmology)  Indicate any recent Medical Services you may have received from other than [facility] providers in the past year (date may be approximate).     Assessment:   This is a routine wellness examination for [place].  Hearing/Vision screen Hearing Screening - Comments:: Patient denied any hearing difficulty. Vision Screening - Comments:: Patient wears eye glasses.  Eye exam done annually by [facility].  Dietary issues and exercise activities discussed: Current Exercise Habits: Home exercise routine, Type of exercise: walking, Time (Minutes): 30, Frequency (Times/Week): 5, Weekly  Exercise (Minutes/Week): 150, Intensity: Mild, Exercise limited by: respiratory conditions(s);psychological condition(s);neurologic condition(s)   Goals Addressed               This Visit's Progress     Patient Stated (pt-stated)        My goal is to get more physically active.      Depression Screen PHQ 2/9 Scores 04/17/2021 02/13/2021 02/06/2020 02/06/2020 02/01/2019 11/05/2017 04/29/2016  PHQ - 2 Score 2 0 0 0 0 0 0  PHQ- 9 Score - - - - 0 - -    Fall Risk Fall Risk  04/17/2021 02/13/2021 02/06/2020 02/01/2019 04/06/2018  Falls in the past year? 0 1 0 0 Yes  Number falls in past yr: 0 0 0 0 2 or more  Injury with Fall? 0 0 0 - No  Risk Factor Category  - - - - High Fall Risk  Risk for fall due to : No Fall Risks - No Fall Risks - -  Follow up Falls evaluation completed - Falls evaluation completed;Education provided - -    FALL RISK PREVENTION PERTAINING TO THE HOME:  Any stairs in or around the home? No  If so, are there any without handrails? No  Home free of loose throw rugs in walkways, pet beds, electrical cords, etc? Yes  Adequate lighting in your home to reduce risk of falls? Yes   ASSISTIVE DEVICES UTILIZED TO PREVENT FALLS:  Life alert? No  Use of a cane, walker or w/c? No  Grab bars in the bathroom? Yes  Shower chair or bench in shower? Yes  Elevated toilet seat or a handicapped toilet? Yes   TIMED UP AND GO:  Was the test performed? Yes .  Length of time to ambulate 10 feet: 6 sec.   Gait steady and fast without use of assistive device  Cognitive Function: Normal cognitive status assessed by direct observation by this Nurse Health Advisor. No abnormalities found.     Montreal Cognitive Assessment  04/06/2018  Visuospatial/ Executive (0/5) 4  Naming (0/3) 3  Attention: Read list of digits (0/2) 2  Attention: Read list of letters (  0/1) 1  Attention: Serial 7 subtraction starting at 100 (0/3) 1  Language: Repeat phrase (0/2) 2  Language : Fluency (0/1) 1   Abstraction (0/2) 2  Delayed Recall (0/5) 0  Orientation (0/6) 6  Total 22  Adjusted Score (based on education) 22   6CIT Screen 02/06/2020  What Year? 0 points  What month? 0 points  What time? 0 points  Count back from 20 0 points  Months in reverse 0 points  Repeat phrase 0 points  Total Score 0    Immunizations Immunization History  Administered Date(s) Administered   Influenza Split 07/13/2011, 08/08/2012   Influenza Whole 04/21/2010   Influenza,inj,Quad PF,6+ Mos 05/26/2013, 06/25/2014   Influenza-Unspecified 06/25/2015   Moderna Sars-Covid-2 Vaccination 09/26/2019, 10/27/2019, 03/30/2020   PFIZER Comirnaty(Gray Top)Covid-19 Tri-Sucrose Vaccine 12/08/2020   Pneumococcal Conjugate-13 06/25/2014   Pneumococcal Polysaccharide-23 11/23/1978, 08/24/1996, 03/25/2007, 02/01/2019   Td 08/24/2002, 08/05/2020   Tdap 11/30/2013    TDAP status: Up to date  Flu Vaccine status: Due, Education has been provided regarding the importance of this vaccine. Advised may receive this vaccine at local pharmacy or Health Dept. Aware to provide a copy of the vaccination record if obtained from local pharmacy or Health Dept. Verbalized acceptance and understanding.  Pneumococcal vaccine status: Up to date  Covid-19 vaccine status: Completed vaccines  Qualifies for Shingles Vaccine? Yes   Zostavax completed No   Shingrix Completed?: No.    Education has been provided regarding the importance of this vaccine. Patient has been advised to call insurance company to determine out of pocket expense if they have not yet received this vaccine. Advised may also receive vaccine at local pharmacy or Health Dept. Verbalized acceptance and understanding.  Screening Tests Health Maintenance  Topic Date Due   Zoster Vaccines- Shingrix (1 of 2) Never done   COVID-19 Vaccine (5 - Booster for Moderna series) 04/09/2021   INFLUENZA VACCINE  03/24/2021   MAMMOGRAM  04/19/2022   DEXA SCAN  09/27/2022    TETANUS/TDAP  08/05/2030   Hepatitis C Screening  Completed   PNA vac Low Risk Adult  Completed   HPV VACCINES  Aged Out    Health Maintenance  Health Maintenance Due  Topic Date Due   Zoster Vaccines- Shingrix (1 of 2) Never done   COVID-19 Vaccine (5 - Booster for Moderna series) 04/09/2021   INFLUENZA VACCINE  03/24/2021    Colorectal cancer screening: Type of screening: Colonoscopy. Completed 07/15/2016. Repeat every 5 years  Mammogram status: Completed 04/19/2020. Repeat every year  Bone Density status: Completed 09/27/2020. Results reflect: Bone density results: OSTEOPENIA. Repeat every 2 years.  Lung Cancer Screening: (Low Dose CT Chest recommended if Age 71-80 years, 30 pack-year currently smoking OR have quit w/in 15years.) does not qualify.   Lung Cancer Screening Referral: no  Additional Screening:  Hepatitis C Screening: does qualify; Completed yes  Vision Screening: Recommended annual ophthalmology exams for early detection of glaucoma and other disorders of the eye. Is the patient up to date with their annual eye exam?  Yes  Who is the provider or what is the name of the office in which the patient attends annual eye exams? [facility] If pt is not established with a provider, would they like to be referred to a provider to establish care? No .   Dental Screening: Recommended annual dental exams for proper oral hygiene  Community Resource Referral / Chronic Care Management: CRR required this visit?  No   CCM required  this visit?  No      Plan:     I have personally reviewed and noted the following in the patient's chart:   Medical and social history Use of alcohol, tobacco or illicit drugs  Current medications and supplements including opioid prescriptions.  Functional ability and status Nutritional status Physical activity Advanced directives List of other physicians Hospitalizations, surgeries, and ER visits in previous 12  months Vitals Screenings to include cognitive, depression, and falls Referrals and appointments  In addition, I have reviewed and discussed with patient certain preventive protocols, quality metrics, and best practice recommendations. A written personalized care plan for preventive services as well as general preventive health recommendations were provided to patient.     Sheral Flow, LPN   [id number]   Nurse Notes: n/a

## 2021-04-17 NOTE — Assessment & Plan Note (Signed)
Referral to rheumatology for possible evaluation of autoimmune arthritis/psoriatic arthritis

## 2021-04-17 NOTE — Patient Instructions (Addendum)
     A referral was ordered for rheumatology.         Someone from their office will call you to schedule an appointment.

## 2021-04-17 NOTE — Assessment & Plan Note (Signed)
Chronic Diagnosed by dermatology Currently on clobetasol foam which is helping some, but still has significant rash More concerning having increased joint pain, muscle pain and episodes of extreme exhaustion concerning for an autoimmune arthritis-possible psoriatic arthritis Dermatology discussed with her Biologics Will refer to rheumatology for further evaluation and guidance

## 2021-04-17 NOTE — Patient Instructions (Addendum)
Jo Gomez , Thank you for taking time to come for your Medicare Wellness Visit. I appreciate your ongoing commitment to your health goals. Please review the following plan we discussed and let me know if I can assist you in the future.   Screening recommendations/referrals: Colonoscopy: last done 07/15/2016; not a candidate for repeat due to age 75: last done 04/19/2020; due every 1-2 years  Bone Density: last done 09/27/2020; due every 2 years Recommended yearly ophthalmology/optometry visit for glaucoma screening and checkup Recommended yearly dental visit for hygiene and checkup  Vaccinations: Influenza vaccine: due Fall 2022  Pneumococcal vaccine: 06/25/2014, 02/01/2019 Tdap vaccine: 11/30/2013' due every 10 years Shingles vaccine: never done   Covid-19: 09/26/2019, 10/27/2019, 03/30/2020, 12/08/2020  Advanced directives: Please bring a copy of your health care power of attorney and living will to the office at your convenience.  Conditions/risks identified: Yes; Client understands the importance of follow-up with providers by attending scheduled visits and discussed goals to eat healthier, increase physical activity, exercise the brain, socialize more, get enough sleep and make time for laughter.  Next appointment: Please schedule your next Medicare Wellness Visit with your Nurse Health Advisor in 1 year by calling [phone].   Preventive Care 29 Years and Older, Female Preventive care refers to lifestyle choices and visits with your health care provider that can promote health and wellness. What does preventive care include? A yearly physical exam. This is also called an annual well check. Dental exams once or twice a year. Routine eye exams. Ask your health care provider how often you should have your eyes checked. Personal lifestyle choices, including: Daily care of your teeth and gums. Regular physical activity. Eating a healthy diet. Avoiding tobacco and drug  use. Limiting alcohol use. Practicing safe sex. Taking low-dose aspirin every day. Taking vitamin and mineral supplements as recommended by your health care provider. What happens during an annual well check? The services and screenings done by your health care provider during your annual well check will depend on your age, overall health, lifestyle risk factors, and family history of disease. Counseling  Your health care provider may ask you questions about your: Alcohol use. Tobacco use. Drug use. Emotional well-being. Home and relationship well-being. Sexual activity. Eating habits. History of falls. Memory and ability to understand (cognition). Work and work Statistician. Reproductive health. Screening  You may have the following tests or measurements: Height, weight, and BMI. Blood pressure. Lipid and cholesterol levels. These may be checked every 5 years, or more frequently if you are over 8 years old. Skin check. Lung cancer screening. You may have this screening every year starting at age 25 if you have a 30-pack-year history of smoking and currently smoke or have quit within the past 15 years. Fecal occult blood test (FOBT) of the stool. You may have this test every year starting at age 26. Flexible sigmoidoscopy or colonoscopy. You may have a sigmoidoscopy every 5 years or a colonoscopy every 10 years starting at age 81. Hepatitis C blood test. Hepatitis B blood test. Sexually transmitted disease (STD) testing. Diabetes screening. This is done by checking your blood sugar (glucose) after you have not eaten for a while (fasting). You may have this done every 1-3 years. Bone density scan. This is done to screen for osteoporosis. You may have this done starting at age 48. Mammogram. This may be done every 1-2 years. Talk to your health care provider about how often you should have regular mammograms. Talk with your health  care provider about your test results, treatment  options, and if necessary, the need for more tests. Vaccines  Your health care provider may recommend certain vaccines, such as: Influenza vaccine. This is recommended every year. Tetanus, diphtheria, and acellular pertussis (Tdap, Td) vaccine. You may need a Td booster every 10 years. Zoster vaccine. You may need this after age 34. Pneumococcal 13-valent conjugate (PCV13) vaccine. One dose is recommended after age 35. Pneumococcal polysaccharide (PPSV23) vaccine. One dose is recommended after age 67. Talk to your health care provider about which screenings and vaccines you need and how often you need them. This information is not intended to replace advice given to you by your health care provider. Make sure you discuss any questions you have with your health care provider. Document Released: 09/06/2015 Document Revised: 04/29/2016 Document Reviewed: 06/11/2015 Elsevier Interactive Patient Education  2017 [place] Prevention in the Home Falls can cause injuries. They can happen to people of all ages. There are many things you can do to make your home safe and to help prevent falls. What can I do on the outside of my home? Regularly fix the edges of walkways and driveways and fix any cracks. Remove anything that might make you trip as you walk through a door, such as a raised step or threshold. Trim any bushes or trees on the path to your home. Use bright outdoor lighting. Clear any walking paths of anything that might make someone trip, such as rocks or tools. Regularly check to see if handrails are loose or broken. Make sure that both sides of any steps have handrails. Any raised decks and porches should have guardrails on the edges. Have any leaves, snow, or ice cleared regularly. Use sand or salt on walking paths during winter. Clean up any spills in your garage right away. This includes oil or grease spills. What can I do in the bathroom? Use night lights. Install grab  bars by the toilet and in the tub and shower. Do not use towel bars as grab bars. Use non-skid mats or decals in the tub or shower. If you need to sit down in the shower, use a plastic, non-slip stool. Keep the floor dry. Clean up any water that spills on the floor as soon as it happens. Remove soap buildup in the tub or shower regularly. Attach bath mats securely with double-sided non-slip rug tape. Do not have throw rugs and other things on the floor that can make you trip. What can I do in the bedroom? Use night lights. Make sure that you have a light by your bed that is easy to reach. Do not use any sheets or blankets that are too big for your bed. They should not hang down onto the floor. Have a firm chair that has side arms. You can use this for support while you get dressed. Do not have throw rugs and other things on the floor that can make you trip. What can I do in the kitchen? Clean up any spills right away. Avoid walking on wet floors. Keep items that you use a lot in easy-to-reach places. If you need to reach something above you, use a strong step stool that has a grab bar. Keep electrical cords out of the way. Do not use floor polish or wax that makes floors slippery. If you must use wax, use non-skid floor wax. Do not have throw rugs and other things on the floor that can make you trip. What can  I do with my stairs? Do not leave any items on the stairs. Make sure that there are handrails on both sides of the stairs and use them. Fix handrails that are broken or loose. Make sure that handrails are as long as the stairways. Check any carpeting to make sure that it is firmly attached to the stairs. Fix any carpet that is loose or worn. Avoid having throw rugs at the top or bottom of the stairs. If you do have throw rugs, attach them to the floor with carpet tape. Make sure that you have a light switch at the top of the stairs and the bottom of the stairs. If you do not have them,  ask someone to add them for you. What else can I do to help prevent falls? Wear shoes that: Do not have high heels. Have rubber bottoms. Are comfortable and fit you well. Are closed at the toe. Do not wear sandals. If you use a stepladder: Make sure that it is fully opened. Do not climb a closed stepladder. Make sure that both sides of the stepladder are locked into place. Ask someone to hold it for you, if possible. Clearly mark and make sure that you can see: Any grab bars or handrails. First and last steps. Where the edge of each step is. Use tools that help you move around (mobility aids) if they are needed. These include: Canes. Walkers. Scooters. Crutches. Turn on the lights when you go into a dark area. Replace any light bulbs as soon as they burn out. Set up your furniture so you have a clear path. Avoid moving your furniture around. If any of your floors are uneven, fix them. If there are any pets around you, be aware of where they are. Review your medicines with your doctor. Some medicines can make you feel dizzy. This can increase your chance of falling. Ask your doctor what other things that you can do to help prevent falls. This information is not intended to replace advice given to you by your health care provider. Make sure you discuss any questions you have with your health care provider. Document Released: 06/06/2009 Document Revised: 01/16/2016 Document Reviewed: 09/14/2014 Elsevier Interactive Patient Education  2017 [employer].

## 2021-04-19 ENCOUNTER — Encounter: Payer: Self-pay | Admitting: Dermatology

## 2021-04-19 NOTE — Progress Notes (Signed)
   Follow-Up Visit   Subjective  Jo Gomez is a 75 y.o. female who presents for the following: Psoriasis (Lower legs flared up biopsy proven psoriasis by Leandrew Koyanagi treatment triamconolione ketoconazole ).  Psoriasis Location: Mostly arms and legs Duration:  Quality:  Associated Signs/Symptoms: Modifying Factors:  Severity:  Timing: Context:   Objective  Well appearing patient in no apparent distress; mood and affect are within normal limits. Generalized, Left Lower Leg - Anterior, Left Upper Arm - Posterior, Right Forearm - Posterior, Right Lower Leg - Anterior, Right Upper Arm - Posterior Small plaques, most severe on extremities.  Patient also stated she has joint pain; no objective synovitis.    A focused examination was performed including head, neck, arms, legs, joints. Relevant physical exam findings are noted in the Assessment and Plan.   Assessment & Plan    Psoriasis Left Upper Arm - Posterior; Right Upper Arm - Posterior; Right Forearm - Posterior; Left Lower Leg - Anterior; Right Lower Leg - Anterior; Generalized  Clobetasol daily after bathing for 3 to 4 weeks; avoid use on face and body folds.  Follow-up by MyChart or phone at that time.  Related Medications clobetasol (OLUX) 0.05 % topical foam Apply to AA once to twice daily      I, Lavonna Monarch, MD, have reviewed all documentation for this visit.  The documentation on 04/19/21 for the exam, diagnosis, procedures, and orders are all accurate and complete.

## 2021-04-30 ENCOUNTER — Other Ambulatory Visit (HOSPITAL_BASED_OUTPATIENT_CLINIC_OR_DEPARTMENT_OTHER): Payer: Self-pay | Admitting: Obstetrics & Gynecology

## 2021-04-30 ENCOUNTER — Other Ambulatory Visit: Payer: Self-pay | Admitting: *Deleted

## 2021-04-30 MED ORDER — EST ESTROGENS-METHYLTEST HS 0.625-1.25 MG PO TABS: ORAL_TABLET | ORAL | 0 refills | Status: DC

## 2021-05-09 ENCOUNTER — Encounter: Payer: Self-pay | Admitting: Internal Medicine

## 2021-05-12 ENCOUNTER — Encounter (HOSPITAL_BASED_OUTPATIENT_CLINIC_OR_DEPARTMENT_OTHER): Payer: Self-pay

## 2021-05-13 MED ORDER — EST ESTROGENS-METHYLTEST HS 0.625-1.25 MG PO TABS: ORAL_TABLET | ORAL | 1 refills | Status: DC

## 2021-05-13 NOTE — Progress Notes (Signed)
Office Visit Note  Patient: Jo Gomez             Date of Birth: Jan 24, 1946           MRN: [id number]             PCP: Binnie Rail, MD Referring: Binnie Rail, MD Visit Date: 05/14/2021  Subjective:  New Patient (Initial Visit) (Patient complains of generalized joint pain and stiffness. Patient had lumbar spine surgery 01/2021. )   History of Present Illness: Jo Gomez is a 75 y.o. 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  She has had chronic joint pain for multiple years in multiple sites more recently had back surgery in June for degenerative disc disease of the lumbar spine.  New development of psoriatic skin lesions since about 9 months ago.  These developed on her shins elbows and outer surfaces of her arms.  She has not seen significant fingernail or toenail changes.  Topical treatment with clobetasol from dermatology developed skin tears and bruising so discontinued this.  She has stiffness sometimes for minutes sometimes throughout much of the day.  Occasional episodes with some swelling but frequently pain with normal joint appearance.  She denies any new abdominal symptoms or hematochezia.  She denies any history of inflammatory eye disease including iritis or uveitis. She does also have a history of past injury with surgical fixation at right radius. 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 Today symptoms are doing fairly well compared to average.   10/2020 ANA neg ANCA neg ESR 11 CRP neg  12/31/20 MRI Lumbar spine IMPRESSION: L4-5: Shallow disc herniation more prominent towards the left. Facet and ligamentous hypertrophy. Stenosis of the lateral recesses left more than right. Neural compression could occur at this level, particularly on the left. This has worsened since 2016. L5-S1: Endplate osteophytes and shallow disc protrusion. Facet and ligamentous  hypertrophy. Narrowing of both subarticular lateral recesses and foramina which could possibly cause neural compression, particularly in the lateral recesses. Slight worsening since 2016.  Activities of Daily Living:  Patient reports morning stiffness for 1-24 hours.   Patient Reports nocturnal pain.  Difficulty dressing/grooming: Reports Difficulty climbing stairs: Reports Difficulty getting out of chair: Reports Difficulty using hands for taps, buttons, cutlery, and/or writing: Reports  Review of Systems  Constitutional:  Positive for fatigue.  HENT:  Negative for mouth sores, mouth dryness and nose dryness.   Eyes:  Positive for visual disturbance. Negative for pain, itching and dryness.  Respiratory:  Positive for shortness of breath. Negative for cough, hemoptysis and difficulty breathing.   Cardiovascular:  Negative for chest pain, palpitations and swelling in legs/feet.  Gastrointestinal:  Negative for abdominal pain, blood in stool, constipation and diarrhea.  Endocrine: Negative for increased urination.  Genitourinary:  Negative for painful urination.  Musculoskeletal:  Positive for joint pain, joint pain, myalgias, muscle weakness, morning stiffness, muscle tenderness and myalgias. Negative for joint swelling.  Skin:  Positive for color change, rash and redness.  Allergic/Immunologic: Positive for susceptible to infections.  Neurological:  Positive for weakness. Negative for dizziness, numbness, headaches and memory loss.  Hematological:  Negative for swollen glands.  Psychiatric/Behavioral:  Negative for confusion and sleep disturbance.    PMFS History:  Patient Active Problem List   Diagnosis Date Noted   Bilateral hand pain 05/14/2021   Myalgia 04/17/2021   Polyarthralgia 04/17/2021   Heartburn 03/31/2021  Not well controlled mild persistent asthma 03/31/2021   Psoriasis 02/12/2021   Nasal sore 01/15/2021   Lumbar radiculopathy, right 01/15/2021   Elevated LFTs  09/10/2020   RUQ pain 07/02/2020   Urine frequency 07/02/2020   History of frequent upper respiratory infection 04/15/2020   Nonallergic rhinitis 04/15/2020   Chronic bronchitis ([place]) 02/01/2019   Migraine headache 01/31/2019   Hyperglycemia 01/30/2019   Chronic cough 01/02/2019   Cardiac murmur 09/29/2016   Pain of both shoulder joints 06/24/2016   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   Chronic sinusitis 01/22/2016   Insomnia 09/16/2015   Low back pain radiating to left leg 06/25/2014   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   Vitamin B12 deficiency 03/05/2010   IgA deficiency ([place]) 10/18/2009   Hypothyroidism 09/05/2007   Allergic rhinitis 09/05/2007   Depression 10/21/2006   Osteopenia 10/21/2006    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   Asthma    Depression    GERD (gastroesophageal reflux disease)    diet controlled, no med   History of endoscopy    HSV infection    IgA deficiency ([place])    Low vitamin B12 level    Osteoporosis    Ovarian cyst    TAH/BSO   Shingles    Thyroid dysfunction     Family History  Problem Relation Age of Onset   Heart attack Mother    Osteoporosis Mother    Lung cancer Mother    Colon polyps Mother    Arthritis Mother    COPD Father        chronic lung infections   Colon polyps Father    Colon cancer Neg Hx    Esophageal cancer Neg Hx    Stomach cancer Neg Hx    Rectal cancer Neg Hx    Past Surgical History:  Procedure Laterality Date   broken arm     surgery for this   BROW LIFT  08/2013   COLONOSCOPY     LUMBAR DISC SURGERY     L-4, L-5, S-1   THYROIDECTOMY  2000   TOTAL ABDOMINAL HYSTERECTOMY W/ BILATERAL SALPINGOOPHORECTOMY  1999   Social History   Social History Narrative   Patient is right-handed. She lives with her wife in a split level house. She occasionally drinks coffee.   Immunization History   Administered Date(s) Administered   Influenza Split 07/13/2011, 08/08/2012   Influenza Whole 04/21/2010   Influenza,inj,Quad PF,6+ Mos 05/26/2013, 06/25/2014   Influenza-Unspecified 06/25/2015   Moderna Sars-Covid-2 Vaccination 09/26/2019, 10/27/2019, 03/30/2020   PFIZER Comirnaty(Gray Top)Covid-19 Tri-Sucrose Vaccine 12/08/2020   Pneumococcal Conjugate-13 06/25/2014   Pneumococcal Polysaccharide-23 11/23/1978, 08/24/1996, 03/25/2007, 02/01/2019   Td 08/24/2002, 08/05/2020   Tdap 11/30/2013     Objective: Vital Signs: BP 124/82 (BP Location: Right Arm, Patient Position: Sitting, Cuff Size: Normal)   Pulse 75   Ht 4' 11"  (1.499 m)   Wt 120 lb (54.4 kg)   LMP 08/24/1997   BMI 24.24 kg/m    Physical Exam HENT:     Mouth/Throat:     Mouth: Mucous membranes are moist.     Pharynx: Oropharynx is clear.  Eyes:     Conjunctiva/sclera: Conjunctivae normal.  Cardiovascular:     Rate and Rhythm: Normal rate and regular rhythm.  Pulmonary:     Effort: Pulmonary effort is normal.     Breath  sounds: Normal breath sounds.  Musculoskeletal:     Right lower leg: No edema.     Left lower leg: No edema.  Skin:    General: Skin is warm and dry.     Comments: Small mostly <1dm diameter psoriatic plaques on arms and shins bilaterally Normal nail appearance Normal nailfold capillaries appearance  Neurological:     Mental Status: She is alert.     Deep Tendon Reflexes: Reflexes normal.  Psychiatric:        Mood and Affect: Mood normal.     Musculoskeletal Exam:  Neck full ROM no tenderness Shoulders full ROM no tenderness or swelling Elbows full ROM no tenderness or swelling Wrists full ROM no tenderness or swelling Fingers full ROM no tenderness or swelling Knees full ROM no tenderness or swelling Ankles full ROM no tenderness or swelling   Investigation: No additional findings.  Imaging: XR Hand 2 View Left  Result Date: 05/14/2021 X-ray left hand 2 views Radiocarpal  joint space appears normal.  Cystic changes present at base of thumb and first MCP.  MCP joint spaces appear normal.  PIP and DIP joint spaces appear well-preserved.  Minimal periosteal reaction changes.  Bone mineralization appears normal. Impression Mild degenerative changes, no erosion fusion or focal demineralization seen to indicate inflammatory change  XR Hand 2 View Right  Result Date: 05/14/2021 X-ray right hand 2 views Surgical screws in position and distal radius.  Numerous cystic changes of ulnar styloid process.  Multiple cystic changes also seen in carpal bones.  Possible narrowing of third MCP joint space otherwise intact.  PIP and DIP joint spaces also appear intact.  Bone mineralization appears normal. Impression Mild degenerative arthritis no erosions or focal demineralization specific for inflammatory arthritis although there is some probable third MCP either narrowing or mild subluxation, surgical hardware in position and distal radius   Recent Labs: Lab Results  Component Value Date   WBC 9.4 02/13/2021   HGB 12.1 02/13/2021   PLT 220.0 02/13/2021   NA 132 (L) 02/13/2021   K 4.0 02/13/2021   CL 96 02/13/2021   CO2 29 02/13/2021   GLUCOSE 78 02/13/2021   BUN 9 02/13/2021   CREATININE 0.69 02/13/2021   BILITOT 0.5 02/13/2021   ALKPHOS 61 02/13/2021   AST 28 02/13/2021   ALT 29 02/13/2021   PROT 6.5 02/13/2021   ALBUMIN 4.1 02/13/2021   CALCIUM 9.0 02/13/2021   GFRAA 82 03/26/2008    Speciality Comments: No specialty comments available.  Procedures:  No procedures performed Allergies: Gammagard s-d less iga [immune globulin (human)], Clarithromycin, Erythromycin base, Codeine, Levaquin [levofloxacin in d5w], Other, Sulfonamide derivatives, and Tetracycline   Assessment / Plan:     Visit Diagnoses: Polyarthralgia Bilateral hand pain - Plan: XR Hand 2 View Left, XR Hand 2 View Right  Joint pain in multiple sites no specific inflammatory changes seen on exam  today.  For the hands bilateral x-rays obtained showing probably degenerative arthritis changes no features strongly suggestive for chronic psoriatic disease.  It may be most beneficial to repeat exam including possible ultrasound exam during another episode of symptom exacerbation recommended she contact the clinic again PRN for this.  Psoriasis  Currently mild disease activity based on limited surfaces involved and very small lesions.  No evidence of nail involvements no described episodes of dactylitis.   Orders: Orders Placed This Encounter  Procedures   XR Hand 2 View Left   XR Hand 2 View Right    No  orders of the defined types were placed in this encounter.    Follow-Up Instructions: Return if symptoms worsen or fail to improve.   Collier Salina, MD  Note - This record has been created using [employer].  Chart creation errors have been sought, but may not always  have been located. Such creation errors do not reflect on  the standard of medical care.

## 2021-05-14 ENCOUNTER — Other Ambulatory Visit: Payer: Self-pay

## 2021-05-14 ENCOUNTER — Ambulatory Visit: Payer: Self-pay

## 2021-05-14 ENCOUNTER — Ambulatory Visit: Payer: Medicare PPO | Admitting: Internal Medicine

## 2021-05-14 ENCOUNTER — Encounter: Payer: Self-pay | Admitting: Internal Medicine

## 2021-05-14 VITALS — BP 124/82 | HR 75 | Ht 59.0 in | Wt 120.0 lb

## 2021-05-14 DIAGNOSIS — M79641 Pain in right hand: Secondary | ICD-10-CM | POA: Diagnosis not present

## 2021-05-14 DIAGNOSIS — L409 Psoriasis, unspecified: Secondary | ICD-10-CM | POA: Diagnosis not present

## 2021-05-14 DIAGNOSIS — M2550 Pain in unspecified joint: Secondary | ICD-10-CM

## 2021-05-14 DIAGNOSIS — M79642 Pain in left hand: Secondary | ICD-10-CM

## 2021-05-14 DIAGNOSIS — D802 Selective deficiency of immunoglobulin A [IgA]: Secondary | ICD-10-CM

## 2021-05-20 ENCOUNTER — Other Ambulatory Visit: Payer: Self-pay | Admitting: Internal Medicine

## 2021-05-20 DIAGNOSIS — Z1231 Encounter for screening mammogram for malignant neoplasm of breast: Secondary | ICD-10-CM

## 2021-05-21 DIAGNOSIS — M5416 Radiculopathy, lumbar region: Secondary | ICD-10-CM | POA: Diagnosis not present

## 2021-05-21 DIAGNOSIS — M545 Low back pain, unspecified: Secondary | ICD-10-CM | POA: Diagnosis not present

## 2021-06-10 DIAGNOSIS — M5416 Radiculopathy, lumbar region: Secondary | ICD-10-CM | POA: Diagnosis not present

## 2021-06-10 DIAGNOSIS — M545 Low back pain, unspecified: Secondary | ICD-10-CM | POA: Diagnosis not present

## 2021-06-13 ENCOUNTER — Ambulatory Visit
Admission: RE | Admit: 2021-06-13 | Discharge: 2021-06-13 | Disposition: A | Discharge status: Home / Self Care | Payer: Medicare PPO | Source: Ambulatory Visit

## 2021-06-13 ENCOUNTER — Other Ambulatory Visit: Payer: Self-pay

## 2021-06-13 DIAGNOSIS — Z1231 Encounter for screening mammogram for malignant neoplasm of breast: Secondary | ICD-10-CM | POA: Diagnosis not present

## 2021-06-16 ENCOUNTER — Ambulatory Visit: Payer: Medicare PPO | Admitting: Rheumatology

## 2021-06-16 DIAGNOSIS — L400 Psoriasis vulgaris: Secondary | ICD-10-CM | POA: Diagnosis not present

## 2021-06-25 ENCOUNTER — Ambulatory Visit (HOSPITAL_BASED_OUTPATIENT_CLINIC_OR_DEPARTMENT_OTHER): Payer: Medicare PPO | Admitting: Obstetrics & Gynecology

## 2021-06-25 ENCOUNTER — Other Ambulatory Visit: Payer: Self-pay

## 2021-06-25 ENCOUNTER — Encounter (HOSPITAL_BASED_OUTPATIENT_CLINIC_OR_DEPARTMENT_OTHER): Payer: Self-pay | Admitting: Obstetrics & Gynecology

## 2021-06-25 VITALS — BP 130/77 | HR 70 | Ht 59.5 in | Wt 120.0 lb

## 2021-06-25 DIAGNOSIS — E559 Vitamin D deficiency, unspecified: Secondary | ICD-10-CM

## 2021-06-25 DIAGNOSIS — Z9229 Personal history of other drug therapy: Secondary | ICD-10-CM

## 2021-06-25 MED ORDER — EST ESTROGENS-METHYLTEST HS 0.625-1.25 MG PO TABS: ORAL_TABLET | ORAL | 1 refills | Status: DC

## 2021-06-25 MED ORDER — VITAMIN D (ERGOCALCIFEROL) 1.25 MG (50000 UNIT) PO CAPS
ORAL_CAPSULE | ORAL | 3 refills | Status: DC
Start: 2021-06-25 — End: 2022-08-31
  Filled 2022-01-12: qty 6, 84d supply, fill #0
  Filled 2022-04-24: qty 6, 84d supply, fill #1

## 2021-06-26 ENCOUNTER — Encounter (HOSPITAL_BASED_OUTPATIENT_CLINIC_OR_DEPARTMENT_OTHER): Payer: Self-pay

## 2021-06-27 MED ORDER — EST ESTROGENS-METHYLTEST HS 0.625-1.25 MG PO TABS: ORAL_TABLET | ORAL | 1 refills | Status: DC

## 2021-06-28 ENCOUNTER — Other Ambulatory Visit: Payer: Self-pay | Admitting: Internal Medicine

## 2021-06-28 NOTE — Progress Notes (Signed)
GYNECOLOGY  VISIT  HPI: 75 y.o. G1P1 Married White or Caucasian female here for follow up regarding HRT.  On 0.625/1.25 Estratest three times weekly.  This has worked well for her for many year and she does not want to stop.  Risks including DVT/PE, stroke, MI, breast cancer are still present even with the every other day dosing.  Is not having hot flashes.  Also on vit D and doing well with this.  Last check was 01/2021.  Was 81.    Last MMG was 06/18/2021.  Last BMD 09/27/2020.  Colonoscopy 2017.  H/o hysterectomy with BSO 1999.2  GYNECOLOGIC HISTORY: Patient's last menstrual period was 08/24/1997.  Patient Active Problem List   Diagnosis Date Noted   Bilateral hand pain 05/14/2021   Myalgia 04/17/2021   Polyarthralgia 04/17/2021   Heartburn 03/31/2021   Not well controlled mild persistent asthma 03/31/2021   Psoriasis 02/12/2021   Nasal sore 01/15/2021   Lumbar radiculopathy, right 01/15/2021   Elevated LFTs 09/10/2020   RUQ pain 07/02/2020   Urine frequency 07/02/2020   History of frequent upper respiratory infection 04/15/2020   Nonallergic rhinitis 04/15/2020   Chronic bronchitis ([place]) 02/01/2019   Migraine headache 01/31/2019   Hyperglycemia 01/30/2019   Chronic cough 01/02/2019   Cardiac murmur 09/29/2016   Pain of both shoulder joints 06/24/2016   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   Chronic sinusitis 01/22/2016   Insomnia 09/16/2015   Low back pain radiating to left leg 06/25/2014   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   Vitamin B12 deficiency 03/05/2010   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   Hypothyroidism 09/05/2007   Allergic rhinitis 09/05/2007   Depression 10/21/2006   Osteopenia 10/21/2006    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   Asthma    Depression    GERD (gastroesophageal reflux disease)    diet controlled, no med   History of endoscopy     HSV infection    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 Vertell Limber   THYROIDECTOMY  2000   TOTAL ABDOMINAL HYSTERECTOMY W/ BILATERAL SALPINGOOPHORECTOMY  1999    MEDS:   Current Outpatient Medications on File Prior to Visit  Medication Sig Dispense Refill   cyanocobalamin (,VITAMIN B-12,) 1000 MCG/ML injection INJECT 1 ML UNDER THE SKIN EVERY 30 DAYS 30 mL 0   fluticasone (FLOVENT HFA) 110 MCG/ACT inhaler Inhale 2 puffs into the lungs daily. And rinse mouth after each use. 1 each 5   levothyroxine (SYNTHROID) 75 MCG tablet TAKE 1 TABLET(75 MCG) BY MOUTH DAILY BEFORE BREAKFAST 90 tablet 3   rizatriptan (MAXALT) 10 MG tablet Take 1 tablet (10 mg total) by mouth as needed for migraine. May repeat in 2 hours if needed 10 tablet 5   sodium chloride (OCEAN) 0.65 % SOLN nasal spray Place 1 spray into both nostrils as needed for congestion. 15 mL 0   traZODone (DESYREL) 150 MG tablet TAKE 1 TABLET(150 MG) BY MOUTH AT BEDTIME 90 tablet 3   TUBERCULIN  SYR 1CC/25GX5/8" (SAFETY-LOK TB SYR 1CC/25GX5/8") 25G X 5/8" 1 ML MISC USE AS DIRECTED FOR B-12 INJECTIONS. 20 each 3   valACYclovir (VALTREX) 1000 MG tablet TAKE 2 TABLETS BY MOUTH EVERY 12 HOURS FOR 1 DAY AS NEEDED 20 tablet 3   clobetasol (OLUX) 0.05 % topical foam Apply to AA once to twice daily (Patient not taking: Reported on 06/25/2021) 50 g 2   DULoxetine (CYMBALTA) 30 MG capsule Take 1 capsule (30 mg total) by mouth daily. (Patient not taking: Reported on 06/25/2021) 30 capsule 5   No current facility-administered medications on file prior to visit.    ALLERGIES: Gammagard s-d less iga [immune globulin (human)], Clarithromycin, Erythromycin base, Codeine, Levaquin [levofloxacin in d5w], Other,  Sulfonamide derivatives, and Tetracycline  Family History  Problem Relation Age of Onset   Heart attack Mother    Osteoporosis Mother    Lung cancer Mother    Colon polyps Mother    Arthritis Mother    COPD Father        chronic lung infections   Colon polyps Father    Colon cancer Neg Hx    Esophageal cancer Neg Hx    Stomach cancer Neg Hx    Rectal cancer Neg Hx    Breast cancer Neg Hx     SH:  married, non smoker  Review of Systems  Constitutional: Negative.   Gastrointestinal: Negative.   Genitourinary: Negative.    PHYSICAL EXAMINATION:    BP 130/77 (BP Location: Right Arm, Patient Position: Sitting, Cuff Size: Normal)   Pulse 70   Ht 4' 11.5" (1.511 m) Comment: reported  Wt 120 lb (54.4 kg)   LMP 08/24/1997   BMI 23.83 kg/m     Physical Exam  Constitutional: No distress.  Pulmonary/Chest: Effort normal and breath sounds normal. She has no wheezes. She has no rales. She exhibits no tenderness.  Neurological: She is alert and oriented to person, place, and time.  Skin: Skin is warm and dry.    Assessment/Plan: There are no diagnoses linked to this encounter.1. History of postmenopausal HRT - estrogen-methylTESTOSTERone (EST ESTROGENS-METHYLTEST HS) 0.625-1.25 MG tablet; TAKE 1 TABLET BY MOUTH 3 TIMES WEEKLY  Dispense: 36 tablet; Refill: 1 - pt aware rx will need to be done again in 24mo because of the testosterone in the medication  2. Vitamin D deficiency - Vitamin D, Ergocalciferol, (DRISDOL) 1.25 MG (50000 UNIT) CAPS capsule; TAKE 1 CAPSULE BY MOUTH EVERY OTHER WEEK  Dispense: 6 capsule; Refill: 3

## 2021-06-29 IMAGING — US US ABDOMEN LIMITED
1 series · 14 of 25 positions shown · non-contrast
Comparison: Abdominal ultrasound 03/06/2010

CLINICAL DATA: Elevated LFTs

EXAM:
ULTRASOUND ABDOMEN LIMITED RIGHT UPPER QUADRANT

[Series 1: us abdomen limited · 14 of 49 slices shown]
[im 1/49]
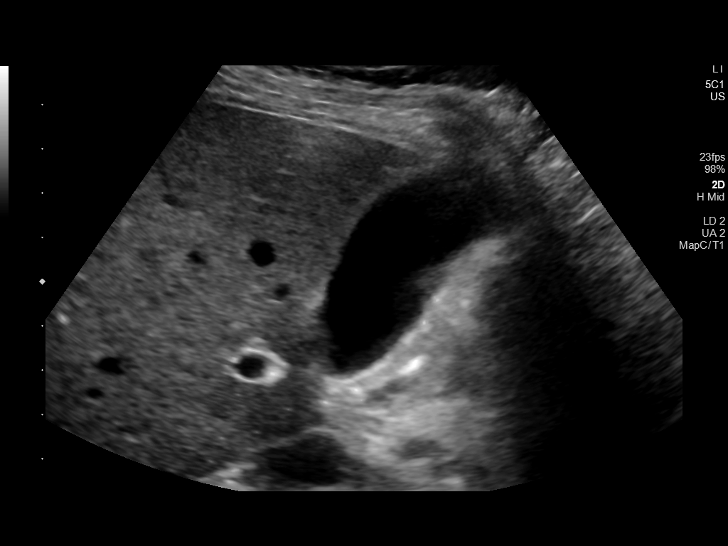
[im 5/49]
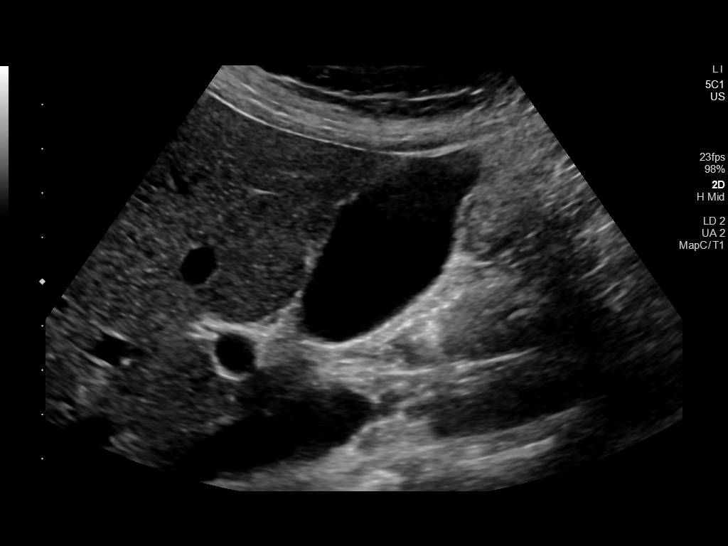
[im 9/49]
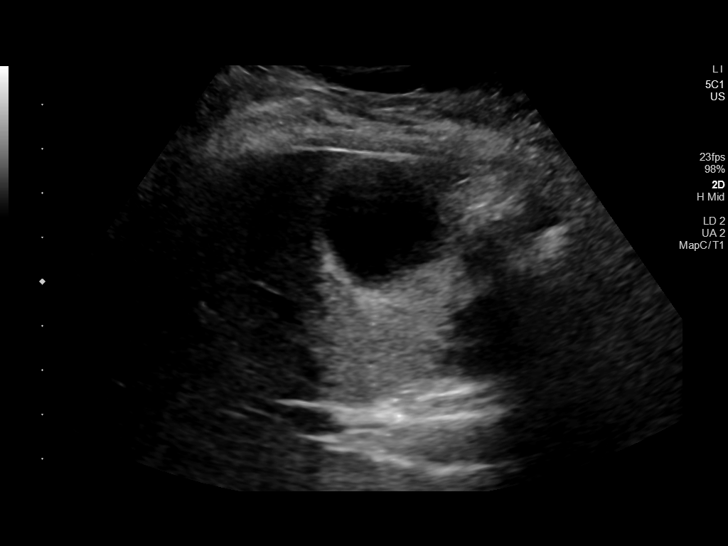
[im 13/49]
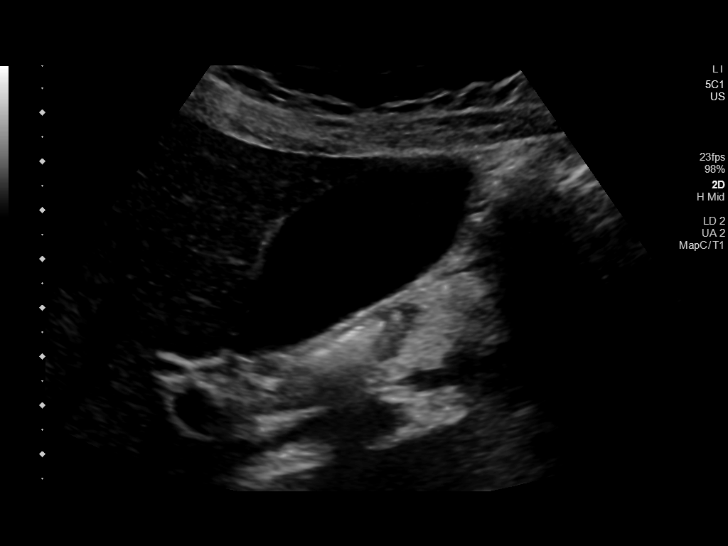
[im 17/49]
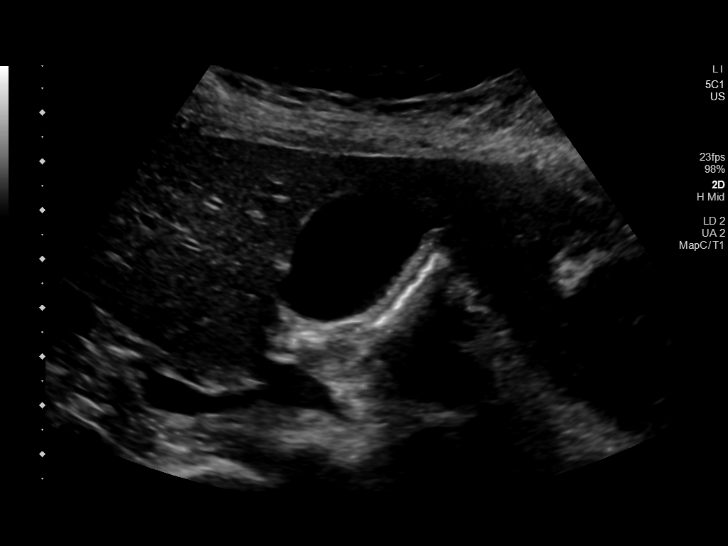
[im 19/49]
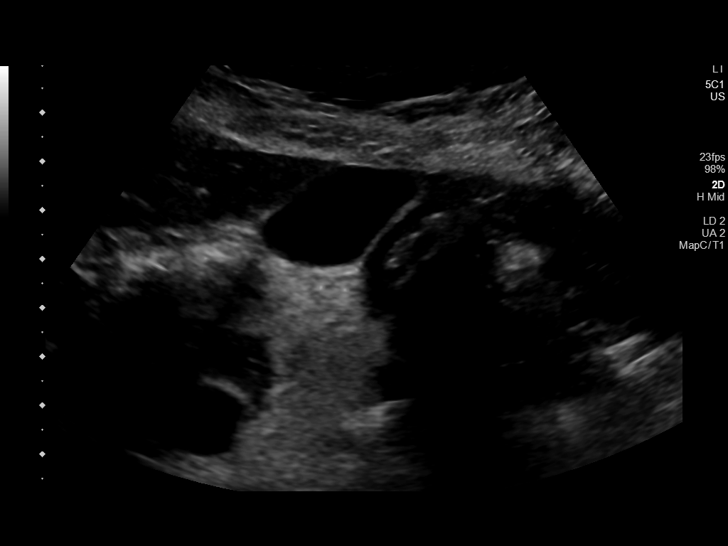
[im 23/49]
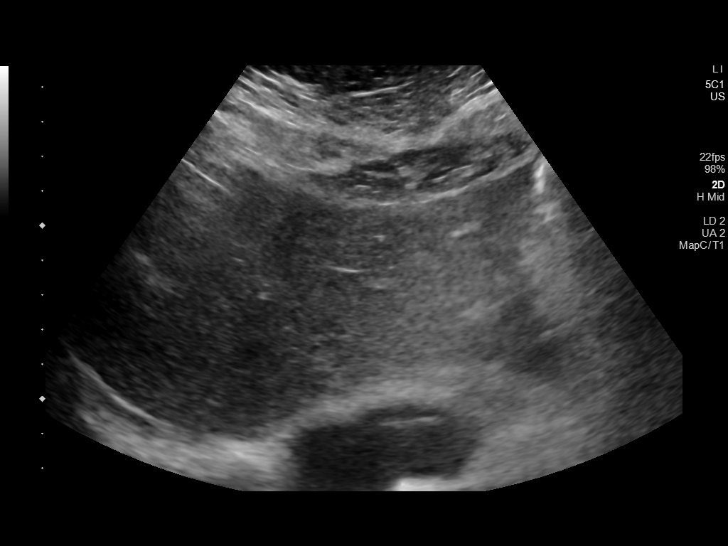
[im 27/49]
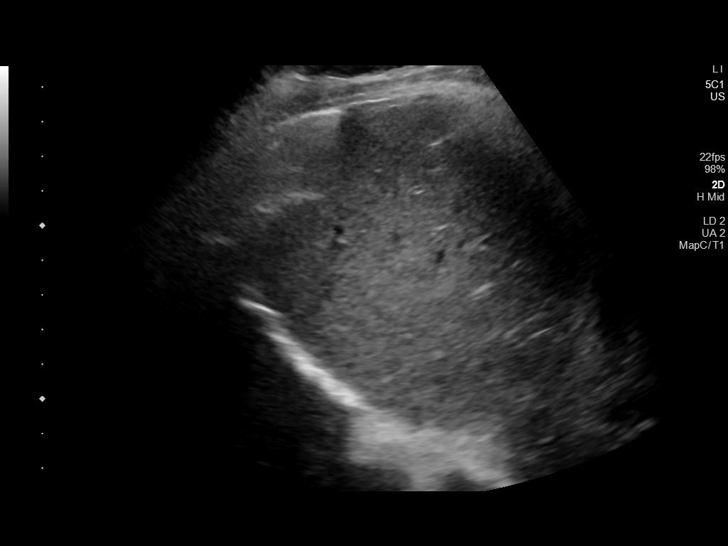
[im 31/49]
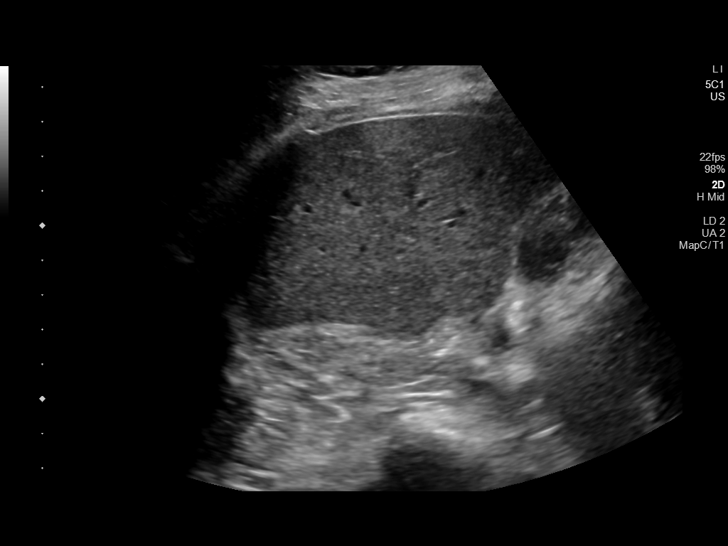
[im 33/49]
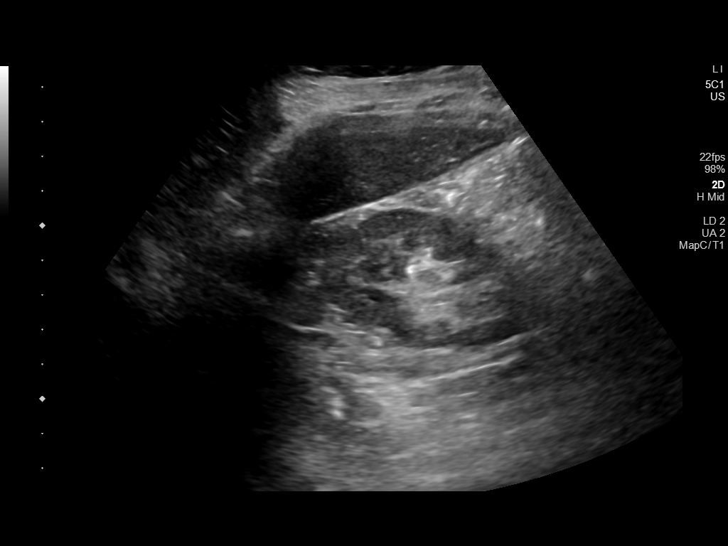
[im 37/49]
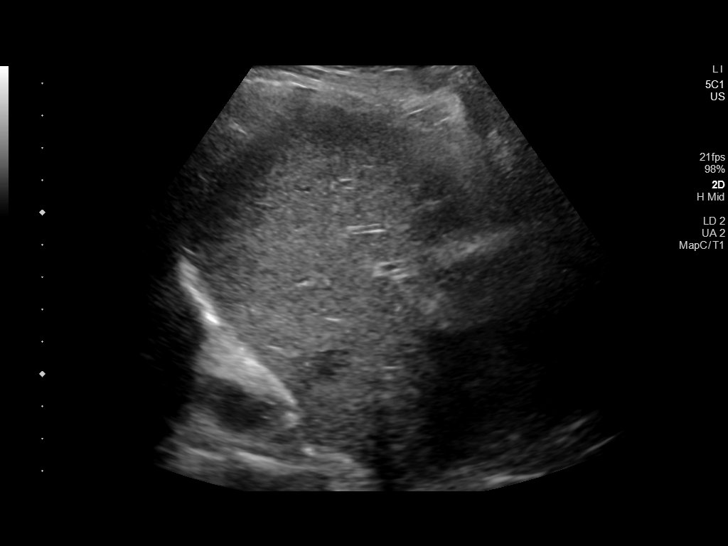
[im 41/49]
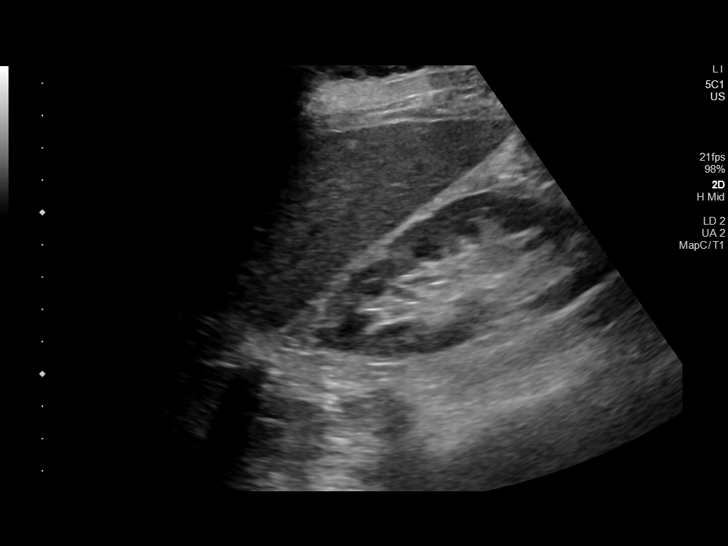
[im 45/49]
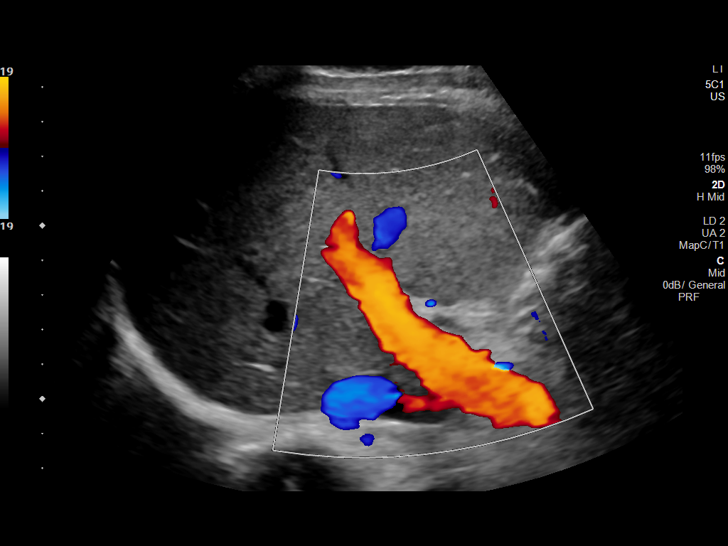
[im 49/49]
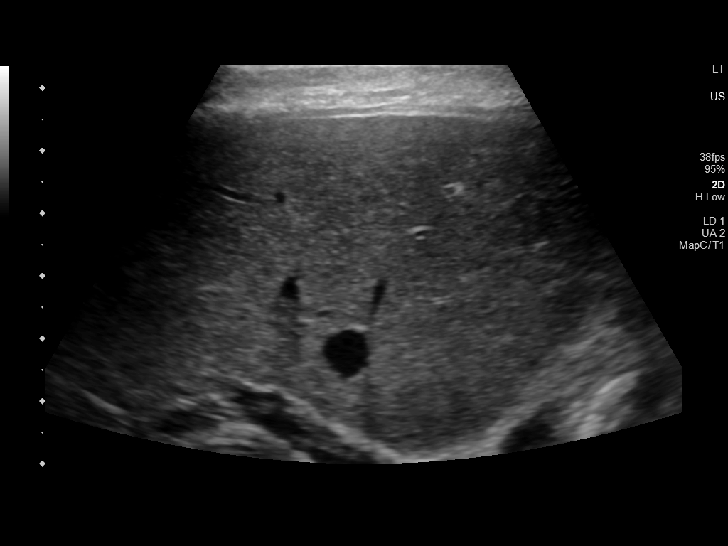

[14 of 25 positions shown; findings below may reference images not displayed]

FINDINGS: Gallbladder:

No gallstones or wall thickening visualized. No sonographic Murphy
sign noted by sonographer.

Common bile duct:

Diameter: 0.4 cm, within normal limits

Liver:

No focal lesion identified. Within normal limits in parenchymal
echogenicity. Portal vein is patent on color Doppler imaging with
normal direction of blood flow towards the liver.

Other: None.
IMPRESSION: No sonographic finding to explain the patient's abnormal liver
enzymes.

## 2021-06-30 DIAGNOSIS — M5126 Other intervertebral disc displacement, lumbar region: Secondary | ICD-10-CM | POA: Diagnosis not present

## 2021-06-30 DIAGNOSIS — M4807 Spinal stenosis, lumbosacral region: Secondary | ICD-10-CM | POA: Diagnosis not present

## 2021-06-30 DIAGNOSIS — M5136 Other intervertebral disc degeneration, lumbar region: Secondary | ICD-10-CM | POA: Diagnosis not present

## 2021-06-30 DIAGNOSIS — R269 Unspecified abnormalities of gait and mobility: Secondary | ICD-10-CM | POA: Diagnosis not present

## 2021-07-03 ENCOUNTER — Telehealth: Payer: Self-pay | Admitting: Gastroenterology

## 2021-07-03 NOTE — Telephone Encounter (Signed)
Inbound call from patient asking when she is due for her next colonoscopy.  Please advise.

## 2021-07-03 NOTE — Telephone Encounter (Signed)
Per last procedure report in 2017.  Patient is due for a 5 year colonoscopy. She has been scheduled for pre-visit and colonoscopy

## 2021-07-07 ENCOUNTER — Other Ambulatory Visit: Payer: Self-pay

## 2021-07-07 ENCOUNTER — Encounter: Payer: Self-pay | Admitting: Allergy

## 2021-07-07 ENCOUNTER — Ambulatory Visit: Payer: Medicare PPO | Admitting: Allergy

## 2021-07-07 VITALS — BP 120/62 | HR 67 | Temp 98.3°F | Resp 16 | Ht 59.5 in | Wt 121.4 lb

## 2021-07-07 DIAGNOSIS — R12 Heartburn: Secondary | ICD-10-CM

## 2021-07-07 DIAGNOSIS — L409 Psoriasis, unspecified: Secondary | ICD-10-CM

## 2021-07-07 DIAGNOSIS — J310 Chronic rhinitis: Secondary | ICD-10-CM | POA: Diagnosis not present

## 2021-07-07 DIAGNOSIS — D802 Selective deficiency of immunoglobulin A [IgA]: Secondary | ICD-10-CM | POA: Diagnosis not present

## 2021-07-07 DIAGNOSIS — J4530 Mild persistent asthma, uncomplicated: Secondary | ICD-10-CM | POA: Diagnosis not present

## 2021-07-07 NOTE — Assessment & Plan Note (Signed)
Past history - Biopsy of rash on legs showed psoriasis. Topical triamcinolone 0.5% ointment not effective.  . Continue to follow up with dermatology regarding this.

## 2021-07-07 NOTE — Patient Instructions (Addendum)
Breathing: Daily controller medication(s): May try to decrease Flovent 111mcg to one puff once a day and rinse mouth after each use.  If you notice the coughing returning then go back to 2 puffs once a day During upper respiratory infections: start Flovent 197mcg 2 puffs twice a day for 1-2 weeks until your breathing symptoms return to baseline.  May use albuterol rescue inhaler 2 puffs every 4 to 6 hours as needed for shortness of breath, chest tightness, coughing, and wheezing. May use albuterol rescue inhaler 2 puffs 5 to 15 minutes prior to strenuous physical activities. Monitor frequency of use.  Breathing control goals:  Full participation in all desired activities (may need albuterol before activity) Albuterol use two times or less a week on average (not counting use with activity) Cough interfering with sleep two times or less a month Oral steroids no more than once a year No hospitalizations  IgA deficiency:  Keep track of infections. Usually need at least 14-21 days worth of antibiotics with infections.  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 There is a small risk of allergic reaction  Chronic rhinitis Use Flonase (fluticasone) nasal spray 1 spray per nostril twice a day as needed for nasal congestion.  Nasal saline spray (i.e., Simply Saline) or nasal saline lavage (i.e., NeilMed) is recommended as needed and prior to medicated nasal sprays.  Skin: Continue to follow up with dermatology regarding this.  Heartburn: Continue lifestyle and dietary modifications.  Follow up in 6 months or sooner if needed.   Skin care recommendations  Bath time: Always use lukewarm water. AVOID very hot or cold water. Keep bathing time to 5-10 minutes. Do NOT use bubble bath. Use a mild soap and use just enough to wash the dirty areas. Do NOT scrub skin vigorously.  After bathing, pat dry your skin with a towel. Do NOT rub or scrub the  skin.  Moisturizers and prescriptions:  ALWAYS apply moisturizers immediately after bathing (within 3 minutes). This helps to lock-in moisture. Use the moisturizer several times a day over the whole body. Good summer moisturizers include: Aveeno, CeraVe, Cetaphil. Good winter moisturizers include: Aquaphor, Vaseline, Cerave, Cetaphil, Eucerin, Vanicream. When using moisturizers along with medications, the moisturizer should be applied about one hour after applying the medication to prevent diluting effect of the medication or moisturize around where you applied the medications. When not using medications, the moisturizer can be continued twice daily as maintenance.  Laundry and clothing: Avoid laundry products with added color or perfumes. Use unscented hypo-allergenic laundry products such as Tide free, Cheer free & gentle, and All free and clear.  If the skin still seems dry or sensitive, you can try double-rinsing the clothes. Avoid tight or scratchy clothing such as wool. Do not use fabric softeners or dyer sheets.

## 2021-07-07 NOTE — Assessment & Plan Note (Signed)
Better controlled with diet.  Continue lifestyle and dietary modifications.

## 2021-07-07 NOTE — Assessment & Plan Note (Signed)
Past history - Respiratory symptoms with URIs mainly for the past 10 years. Uses albuterol in these situations with good benefit. 2021 spirometry was normal. Interim history - coughing resolved with daily Flovent use.   Today's spirometry was normal. . Daily controller medication(s): May try to decrease Flovent 187mcg to one puff once a day and rinse mouth after each use.  o If you notice the coughing returning then go back to 2 puffs once a day . During upper respiratory infections: start Flovent 172mcg 2 puffs twice a day for 1-2 weeks until your breathing symptoms return to baseline.  . May use albuterol rescue inhaler 2 puffs every 4 to 6 hours as needed for shortness of breath, chest tightness, coughing, and wheezing. May use albuterol rescue inhaler 2 puffs 5 to 15 minutes prior to strenuous physical activities. Monitor frequency of use.   Get spirometry at next visit.

## 2021-07-07 NOTE — Assessment & Plan Note (Signed)
Past history - Frequent sinus infections requiring antibiotics. Environmental allergy panel was negative.  Interim history - stable.  . Use Flonase (fluticasone) nasal spray 1 spray per nostril twice a day as needed for nasal congestion.  . Nasal saline spray (i.e., Simply Saline) or nasal saline lavage (i.e., NeilMed) is recommended as needed and prior to medicated nasal sprays.

## 2021-07-07 NOTE — Progress Notes (Signed)
Follow Up Note  RE: Jo Gomez MRN: [id number] DOB: Aug 20, 1946 Date of Office Visit: 07/07/2021  Referring provider: Binnie Rail, MD Primary care provider: Binnie Rail, MD  Chief Complaint: Asthma (3 MTH F/U - Patient feels the [place] has really helped.)  History of Present Illness: I had the pleasure of seeing Jo Gomez for a follow up visit at the [facility] on 07/07/2021. She is a 75 y.o. female, who is being followed for asthma, IgA deficiency, non-allergic rhinitis, heartburn and psoriasis. Her previous allergy office visit was on 03/31/2021 with Dr. Maudie Mercury. Today is a regular follow up visit.  Asthma  Currently on Flovent 165mcg 2 puffs once a day and noticed it helped the coughing. No albuterol use since the last visit.   Denies any SOB, coughing, wheezing, chest tightness, nocturnal awakenings, ER/urgent care visits or prednisone use since the last visit.   IgA deficiency Got flu vaccine and Covid-19 bivalent booster. No infections or antibiotics since the last visit.  Nonallergic rhinitis Asymptomatic with no nasal spray use.    Heartburn Doing much better and watching her diet.   Psoriasis Follows with dermatology and using topical steroid creams.  Patient hesitant about starting other meds for this due to their side effect profile.   Assessment and Plan: Jo Gomez is a 75 y.o. female with: Mild persistent asthma without complication Past history - Respiratory symptoms with URIs mainly for the past 10 years. Uses albuterol in these situations with good benefit. 2021 spirometry was normal. Interim history - coughing resolved with daily Flovent use.  Today's spirometry was normal. Daily controller medication(s): May try to decrease Flovent 168mcg to one puff once a day and rinse mouth after each use.  If you notice the coughing returning then go back to 2 puffs once a day During upper respiratory infections: start Flovent 125mcg 2  puffs twice a day for 1-2 weeks until your breathing symptoms return to baseline.  May use albuterol rescue inhaler 2 puffs every 4 to 6 hours as needed for shortness of breath, chest tightness, coughing, and wheezing. May use albuterol rescue inhaler 2 puffs 5 to 15 minutes prior to strenuous physical activities. Monitor frequency of use.  Get spirometry at next visit.  IgA deficiency [facility]) Past history - Patient was diagnosed with IgA deficiency at [facility] over 10 years ago. Father had IgA deficiency as well. Patient has issues with frequent sinopulmonary infections requiring prolonged courses of antibiotics. 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 Normal IgG and IgM; low IgA, pneumococcal titers adequate, good response to Tdap. Interim history - no sinopulmonary infections. Up to date with Covid-19 vaccinations with bivalent booster and flu vaccine. Keep track of infections. Usually need at least 14-21 days worth of antibiotics with infections.  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 There is a small risk of allergic reaction.  Nonallergic rhinitis Past history - Frequent sinus infections requiring antibiotics. Environmental allergy panel was negative.  Interim history - stable.  Use Flonase (fluticasone) nasal spray 1 spray per nostril twice a day as needed for nasal congestion.  Nasal saline spray (i.e., Simply Saline) or nasal saline lavage (i.e., NeilMed) is recommended as needed and prior to medicated nasal sprays.  Heartburn Better controlled with diet. Continue lifestyle and dietary modifications.  Psoriasis Past history - Biopsy of rash on legs showed psoriasis. Topical triamcinolone 0.5% ointment not effective.  Continue to follow up  with dermatology regarding this.  Return in about 6 months (around 01/04/2022).  No orders of the defined types were placed in this encounter.  Lab Orders  No laboratory test(s) ordered today     Diagnostics: Spirometry:  Tracings reviewed. Her effort: Good reproducible efforts. FVC: 2.25L FEV1: 1.56L, 86% predicted FEV1/FVC ratio: 69% Interpretation: Spirometry consistent with normal pattern.  Please see scanned spirometry results for details.  Medication List:  Current Outpatient Medications  Medication Sig Dispense Refill   cyanocobalamin (,VITAMIN B-12,) 1000 MCG/ML injection INJECT 1ML UNDER THE SKIN EVERY 30 DAYS 3 mL 3   estrogen-methylTESTOSTERone (EST ESTROGENS-METHYLTEST HS) 0.625-1.25 MG tablet TAKE 1 TABLET BY MOUTH 3 TIMES WEEKLY 36 tablet 1   fluticasone (FLOVENT HFA) 110 MCG/ACT inhaler Inhale 2 puffs into the lungs daily. And rinse mouth after each use. 1 each 5   levothyroxine (SYNTHROID) 75 MCG tablet TAKE 1 TABLET(75 MCG) BY MOUTH DAILY BEFORE BREAKFAST 90 tablet 3   rizatriptan (MAXALT) 10 MG tablet Take 1 tablet (10 mg total) by mouth as needed for migraine. May repeat in 2 hours if needed 10 tablet 5   sodium chloride (OCEAN) 0.65 % SOLN nasal spray Place 1 spray into both nostrils as needed for congestion. 15 mL 0   traZODone (DESYREL) 150 MG tablet TAKE 1 TABLET(150 MG) BY MOUTH AT BEDTIME 90 tablet 3   TUBERCULIN SYR 1CC/25GX5/8" (SAFETY-LOK TB SYR 1CC/25GX5/8") 25G X 5/8" 1 ML MISC USE AS DIRECTED FOR B-12 INJECTIONS. 20 each 3   valACYclovir (VALTREX) 1000 MG tablet TAKE 2 TABLETS BY MOUTH EVERY 12 HOURS FOR 1 DAY AS NEEDED 20 tablet 3   Vitamin D, Ergocalciferol, (DRISDOL) 1.25 MG (50000 UNIT) CAPS capsule TAKE 1 CAPSULE BY MOUTH EVERY OTHER WEEK 6 capsule 3   No current facility-administered medications for this visit.   Allergies: Allergies  Allergen Reactions   Gammagard S-D Less Iga [Immune Globulin (Human)] Anaphylaxis    Have to have triple washed blood products due to this allergy pt states   Clarithromycin Other (See Comments)    Severe leg and muscle cramps   Erythromycin Base Nausea And Vomiting   Codeine Rash    Syrup only, can  take hydrocodone, oxycodone   Levaquin [Levofloxacin In D5w] Other (See Comments)    tendonitis   Other Rash and Other (See Comments)    IGA Deficiency - catches a lot of viruses   Sulfonamide Derivatives Rash   Tetracycline Rash   I reviewed her past medical history, social history, family history, and environmental history and no significant changes have been reported from her previous visit.  Review of Systems  Constitutional:  Negative for appetite change, chills, fever and unexpected weight change.  HENT:  Negative for congestion and rhinorrhea.   Eyes:  Negative for itching.  Respiratory:  Negative for cough, chest tightness, shortness of breath and wheezing.   Cardiovascular:  Negative for chest pain.  Gastrointestinal:  Negative for abdominal pain.  Genitourinary:  Negative for difficulty urinating.  Skin:  Positive for rash.  Neurological:  Negative for headaches.   Objective: BP 120/62   Pulse 67   Temp 98.3 F (36.8 C)   Resp 16   Ht 4' 11.5" (1.511 m)   Wt 121 lb 6.4 oz (55.1 kg)   LMP 08/24/1997   SpO2 97%   BMI 24.11 kg/m  Body mass index is 24.11 kg/m. Physical Exam Vitals and nursing note reviewed.  Constitutional:      Appearance: Normal appearance.  She is well-developed.  HENT:     Head: Normocephalic and atraumatic.     Right Ear: Tympanic membrane and external ear normal.     Left Ear: External ear normal. There is impacted cerumen.     Nose: Nose normal.     Mouth/Throat:     Mouth: Mucous membranes are moist.     Pharynx: Oropharynx is clear.  Eyes:     Conjunctiva/sclera: Conjunctivae normal.  Cardiovascular:     Rate and Rhythm: Normal rate and regular rhythm.     Heart sounds: Normal heart sounds. No murmur heard.   No friction rub. No gallop.  Pulmonary:     Effort: Pulmonary effort is normal.     Breath sounds: Normal breath sounds. No wheezing, rhonchi or rales.  Musculoskeletal:     Cervical back: Neck supple.  Skin:    General:  Skin is warm.     Findings: Rash present.     Comments: Erythematous patches on anterior shin area.  Neurological:     Mental Status: She is alert and oriented to person, place, and time.  Psychiatric:        Behavior: Behavior normal.  Previous notes and tests were reviewed. The plan was reviewed with the patient/family, and all questions/concerned were addressed.  It was my pleasure to see Yanci today and participate in her care. Please feel free to contact me with any questions or concerns.  Sincerely,  Rexene Alberts, DO Allergy & Immunology  [facility] office: [place] office: [phone]

## 2021-07-07 NOTE — Assessment & Plan Note (Signed)
Past history - Patient was diagnosed with IgA deficiency at [facility] over 10 years ago. Father had IgA deficiency as well. Patient has issues with frequent sinopulmonary infections requiring prolonged courses of antibiotics. 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 Normal IgG and IgM; low IgA, pneumococcal titers adequate, good response to Tdap. Interim history - no sinopulmonary infections. Up to date with Covid-19 vaccinations with bivalent booster and flu vaccine.  Keep track of infections.  Usually need at least 14-21 days worth of antibiotics with infections.   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 There is a small risk of allergic reaction.

## 2021-08-04 DIAGNOSIS — M5416 Radiculopathy, lumbar region: Secondary | ICD-10-CM | POA: Diagnosis not present

## 2021-08-05 ENCOUNTER — Other Ambulatory Visit: Payer: Self-pay | Admitting: Internal Medicine

## 2021-08-06 DIAGNOSIS — M5416 Radiculopathy, lumbar region: Secondary | ICD-10-CM | POA: Diagnosis not present

## 2021-08-12 ENCOUNTER — Other Ambulatory Visit: Payer: Self-pay

## 2021-08-12 ENCOUNTER — Ambulatory Visit (AMBULATORY_SURGERY_CENTER): Payer: Medicare PPO | Admitting: *Deleted

## 2021-08-12 VITALS — Ht 59.5 in | Wt 112.0 lb

## 2021-08-12 DIAGNOSIS — Z8371 Family history of colonic polyps: Secondary | ICD-10-CM

## 2021-08-12 MED ORDER — NA SULFATE-K SULFATE-MG SULF 17.5-3.13-1.6 GM/177ML PO SOLN: 1.0000 | Freq: Once | ORAL | 0 refills | Status: AC

## 2021-08-12 NOTE — Progress Notes (Signed)
No egg or soy allergy known to patient  No issues known to pt with past sedation with any surgeries or procedures Patient denies ever being told they had issues or difficulty with intubation  No FH of Malignant Hyperthermia Pt is not on diet pills Pt is not on  home 02  Pt is not on blood thinners  Pt denies issues with constipation  No A fib or A flutter  Pt is fully vaccinated  for Covid   and  NO PA's for preps discussed with pt In PV today  Discussed with pt there will be an out-of-pocket cost for prep and that varies from $0 to 70 +  dollars - pt verbalized understanding   Due to the COVID-19 pandemic we are asking patients to follow certain guidelines in PV and the [place]   Pt aware of COVID protocols and [facility] guidelines   PV completed over the phone. Pt verified name, DOB, address and insurance during PV today.  Pt mailed instruction packet with copy of consent form to read and not return, and instruction Pt encouraged to call with questions or issues.  If pt has My chart, procedure instructions sent via My Chart

## 2021-08-13 DIAGNOSIS — M5416 Radiculopathy, lumbar region: Secondary | ICD-10-CM | POA: Diagnosis not present

## 2021-08-20 DIAGNOSIS — M5416 Radiculopathy, lumbar region: Secondary | ICD-10-CM | POA: Diagnosis not present

## 2021-08-26 DIAGNOSIS — M5416 Radiculopathy, lumbar region: Secondary | ICD-10-CM | POA: Diagnosis not present

## 2021-08-29 ENCOUNTER — Encounter: Payer: Self-pay | Admitting: Gastroenterology

## 2021-09-01 DIAGNOSIS — M5416 Radiculopathy, lumbar region: Secondary | ICD-10-CM | POA: Diagnosis not present

## 2021-09-01 DIAGNOSIS — Q046 Congenital cerebral cysts: Secondary | ICD-10-CM | POA: Diagnosis not present

## 2021-09-03 ENCOUNTER — Encounter: Payer: Self-pay | Admitting: Gastroenterology

## 2021-09-03 ENCOUNTER — Ambulatory Visit (AMBULATORY_SURGERY_CENTER): Payer: Medicare PPO | Admitting: Gastroenterology

## 2021-09-03 ENCOUNTER — Other Ambulatory Visit: Payer: Self-pay

## 2021-09-03 VITALS — BP 126/58 | HR 57 | Temp 96.8°F | Resp 17 | Ht 59.5 in | Wt 117.0 lb

## 2021-09-03 DIAGNOSIS — F419 Anxiety disorder, unspecified: Secondary | ICD-10-CM | POA: Diagnosis not present

## 2021-09-03 DIAGNOSIS — Z1211 Encounter for screening for malignant neoplasm of colon: Secondary | ICD-10-CM

## 2021-09-03 DIAGNOSIS — Z8371 Family history of colonic polyps: Secondary | ICD-10-CM | POA: Diagnosis not present

## 2021-09-03 DIAGNOSIS — F329 Major depressive disorder, single episode, unspecified: Secondary | ICD-10-CM | POA: Diagnosis not present

## 2021-09-03 MED ORDER — SODIUM CHLORIDE 0.9 % IV SOLN: 500.0000 mL | Freq: Once | INTRAVENOUS | Status: DC

## 2021-09-03 NOTE — Progress Notes (Signed)
Sedate, gd SR, tolerated procedure well, VSS, report to RN 

## 2021-09-03 NOTE — Patient Instructions (Signed)
Handout on diverticulosis given to you today   YOU HAD AN ENDOSCOPIC PROCEDURE TODAY AT [facility]:   Refer to the procedure report that was given to you for any specific questions about what was found during the examination.  If the procedure report does not answer your questions, please call your gastroenterologist to clarify.  If you requested that your care partner not be given the details of your procedure findings, then the procedure report has been included in a sealed envelope for you to review at your convenience later.  YOU SHOULD EXPECT: Some feelings of bloating in the abdomen. Passage of more gas than usual.  Walking can help get rid of the air that was put into your GI tract during the procedure and reduce the bloating. If you had a lower endoscopy (such as a colonoscopy or flexible sigmoidoscopy) you may notice spotting of blood in your stool or on the toilet paper. If you underwent a bowel prep for your procedure, you may not have a normal bowel movement for a few days.  Please Note:  You might notice some irritation and congestion in your nose or some drainage.  This is from the oxygen used during your procedure.  There is no need for concern and it should clear up in a day or so.  SYMPTOMS TO REPORT IMMEDIATELY:  Following lower endoscopy (colonoscopy or flexible sigmoidoscopy):  Excessive amounts of blood in the stool  Significant tenderness or worsening of abdominal pains  Swelling of the abdomen that is new, acute  Fever of 100F or higher  For urgent or emergent issues, a gastroenterologist can be reached at any hour by calling [phone]. Do not use MyChart messaging for urgent concerns.    DIET:  We do recommend a small meal at first, but then you may proceed to your regular diet.  Drink plenty of fluids but you should avoid alcoholic beverages for 24 hours.  ACTIVITY:  You should plan to take it easy for the rest of today and you should NOT DRIVE  or use heavy machinery until tomorrow (because of the sedation medicines used during the test).    FOLLOW UP: Our staff will call the number listed on your records 48-72 hours following your procedure to check on you and address any questions or concerns that you may have regarding the information given to you following your procedure. If we do not reach you, we will leave a message.  We will attempt to reach you two times.  During this call, we will ask if you have developed any symptoms of COVID 19. If you develop any symptoms (ie: fever, flu-like symptoms, shortness of breath, cough etc.) before then, please call [phone].  If you test positive for Covid 19 in the 2 weeks post procedure, please call and report this information to [place].    SIGNATURES/CONFIDENTIALITY: You and/or your care partner have signed paperwork which will be entered into your electronic medical record.  These signatures attest to the fact that that the information above on your After Visit Summary has been reviewed and is understood.  Full responsibility of the confidentiality of this discharge information lies with you and/or your care-partner.

## 2021-09-03 NOTE — Progress Notes (Signed)
History & Physical  Primary Care Physician:  Binnie Rail, MD Primary Gastroenterologist: Lucio Edward, MD  CHIEF COMPLAINT: Family history of colon polyps   HPI: Jo Gomez is a 76 y.o. female family history of colon polyps for colonoscopy.    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 Vertell Limber   THYROIDECTOMY  2000   TOTAL ABDOMINAL HYSTERECTOMY W/ BILATERAL SALPINGOOPHORECTOMY  1999   UPPER GASTROINTESTINAL ENDOSCOPY      Prior to Admission medications   Medication Sig Start Date End Date Taking? Authorizing Provider  clobetasol cream (TEMOVATE) 0.05 % clobetasol 0.05 % topical cream  APPLY ONCE TO TWICE DAILY TO THE AFFECTED AREA   Yes [provider]  estrogen-methylTESTOSTERone (EST ESTROGENS-METHYLTEST HS) 0.625-1.25 MG tablet TAKE 1 TABLET BY MOUTH 3 TIMES WEEKLY 06/27/21  Yes Megan Salon, MD  levothyroxine (SYNTHROID) 75 MCG tablet TAKE 1 TABLET(75 MCG) BY MOUTH DAILY BEFORE BREAKFAST 01/17/21  Yes Burns, Claudina Lick, MD  sertraline (ZOLOFT) 100 MG tablet sertraline 100 mg tablet   Yes [provider]  traZODone (DESYREL) 150 MG tablet TAKE 1 TABLET(150 MG) BY MOUTH AT BEDTIME 04/17/21  Yes Burns, Claudina Lick, MD  triamcinolone cream (KENALOG) 0.1 %    Yes [provider]  cyanocobalamin (,VITAMIN B-12,) 1000 MCG/ML injection INJECT 1ML UNDER THE SKIN EVERY 30 DAYS 06/30/21   Binnie Rail, MD  fluticasone (FLOVENT HFA) 110 MCG/ACT  inhaler Inhale 2 puffs into the lungs daily. And rinse mouth after each use. Patient taking differently: Inhale 2 puffs into the lungs daily. And rinse mouth after each use. 1 puff daily 03/31/21   Garnet Sierras, DO  gabapentin (NEURONTIN) 100 MG capsule gabapentin 100 mg capsule Patient not taking: Reported on 08/12/2021    [provider]  gabapentin (NEURONTIN) 300 MG capsule gabapentin 300 mg capsule    [provider]  methocarbamol (ROBAXIN) 500 MG tablet methocarbamol 500 mg tablet  TAKE 1 TABLET BY MOUTH FOUR TIMES DAILY 08/05/21   [provider]  rizatriptan (MAXALT) 10 MG tablet Take 1 tablet (10 mg total) by mouth as needed for migraine. May repeat in 2 hours if needed Patient not taking: Reported on 09/03/2021 10/04/19   Binnie Rail, MD  sodium chloride (OCEAN) 0.65 % SOLN nasal spray Place 1 spray into both nostrils as needed for congestion. 07/28/16   Nche, Charlene Brooke, NP  TUBERCULIN SYR 1CC/25GX5/8" (SAFETY-LOK TB SYR 1CC/25GX5/8") 25G X 5/8" 1 ML MISC USE AS DIRECTED FOR B-12 INJECTIONS. 11/06/17   Binnie Rail, MD  valACYclovir (VALTREX) 1000 MG tablet TAKE 2 TABLETS BY MOUTH EVERY 12 HOURS FOR 1 DAY AS NEEDED 02/07/20   Binnie Rail, MD  Vitamin D, Ergocalciferol, (DRISDOL) 1.25  MG (50000 UNIT) CAPS capsule TAKE 1 CAPSULE BY MOUTH EVERY OTHER WEEK Patient not taking: Reported on 08/12/2021 06/25/21   Megan Salon, MD    Current Outpatient Medications  Medication Sig Dispense Refill   clobetasol cream (TEMOVATE) 0.05 % clobetasol 0.05 % topical cream  APPLY ONCE TO TWICE DAILY TO THE AFFECTED AREA     estrogen-methylTESTOSTERone (EST ESTROGENS-METHYLTEST HS) 0.625-1.25 MG tablet TAKE 1 TABLET BY MOUTH 3 TIMES WEEKLY 36 tablet 1   levothyroxine (SYNTHROID) 75 MCG tablet TAKE 1 TABLET(75 MCG) BY MOUTH DAILY BEFORE BREAKFAST 90 tablet 3   sertraline (ZOLOFT) 100 MG tablet sertraline 100 mg tablet     traZODone (DESYREL) 150 MG tablet TAKE 1  TABLET(150 MG) BY MOUTH AT BEDTIME 90 tablet 3   triamcinolone cream (KENALOG) 0.1 %      cyanocobalamin (,VITAMIN B-12,) 1000 MCG/ML injection INJECT 1ML UNDER THE SKIN EVERY 30 DAYS 3 mL 3   fluticasone (FLOVENT HFA) 110 MCG/ACT inhaler Inhale 2 puffs into the lungs daily. And rinse mouth after each use. (Patient taking differently: Inhale 2 puffs into the lungs daily. And rinse mouth after each use. 1 puff daily) 1 each 5   gabapentin (NEURONTIN) 100 MG capsule gabapentin 100 mg capsule (Patient not taking: Reported on 08/12/2021)     gabapentin (NEURONTIN) 300 MG capsule gabapentin 300 mg capsule     methocarbamol (ROBAXIN) 500 MG tablet methocarbamol 500 mg tablet  TAKE 1 TABLET BY MOUTH FOUR TIMES DAILY     rizatriptan (MAXALT) 10 MG tablet Take 1 tablet (10 mg total) by mouth as needed for migraine. May repeat in 2 hours if needed (Patient not taking: Reported on 09/03/2021) 10 tablet 5   sodium chloride (OCEAN) 0.65 % SOLN nasal spray Place 1 spray into both nostrils as needed for congestion. 15 mL 0   TUBERCULIN SYR 1CC/25GX5/8" (SAFETY-LOK TB SYR 1CC/25GX5/8") 25G X 5/8" 1 ML MISC USE AS DIRECTED FOR B-12 INJECTIONS. 20 each 3   valACYclovir (VALTREX) 1000 MG tablet TAKE 2 TABLETS BY MOUTH EVERY 12 HOURS FOR 1 DAY AS NEEDED 20 tablet 3   Vitamin D, Ergocalciferol, (DRISDOL) 1.25 MG (50000 UNIT) CAPS capsule TAKE 1 CAPSULE BY MOUTH EVERY OTHER WEEK (Patient not taking: Reported on 08/12/2021) 6 capsule 3   Current Facility-Administered Medications  Medication Dose Route Frequency Provider Last Rate Last Admin   0.9 %  sodium chloride infusion  500 mL Intravenous Once Ladene Artist, MD        Allergies as of 09/03/2021 - Review Complete 09/03/2021  Allergen Reaction Noted   Gammagard s-d less iga [immune globulin (human)] Anaphylaxis 06/17/2020   Clarithromycin Other (See Comments) 04/03/2008   Erythromycin base Nausea And Vomiting 01/13/2016   Codeine Rash 11/23/2013   Levaquin  [levofloxacin in d5w] Other (See Comments) 02/06/2016   Other Rash and Other (See Comments) 04/18/2009   Sulfonamide derivatives Rash 04/18/2009   Tetracycline Rash 04/18/2009    Family History  Problem Relation Age of Onset   Heart attack Mother    Osteoporosis Mother    Lung cancer Mother    Colon polyps Mother    Arthritis Mother    COPD Father        chronic lung infections   Colon polyps Father    Colon cancer Neg Hx    Esophageal cancer Neg Hx    Stomach cancer Neg Hx    Rectal cancer Neg Hx    Breast cancer Neg Hx  Social History   Socioeconomic History   Marital status: Married    Spouse name: [name]   Number of children: 1   Years of education: Not on file   Highest education level: Master's degree (e.g., MA, MS, MEng, MEd, MSW, MBA)  Occupational History    Employer: RETIRED  Tobacco Use   Smoking status: Former    Packs/day: 0.50    Years: 3.00    Pack years: 1.50    Types: Cigarettes    Quit date: 08/24/1968    Years since quitting: 53.0   Smokeless tobacco: Never   Tobacco comments:    in college  Vaping Use   Vaping Use: Never used  Substance and Sexual Activity   Alcohol use: Yes    Alcohol/week: 2.0 standard drinks    Types: 2 Standard drinks or equivalent per week   Drug use: No   Sexual activity: Yes    Partners: Female    Birth control/protection: Surgical    Comment: TAH/BSO  Other Topics Concern   Not on file  Social History Narrative   Patient is right-handed. She lives with her wife in a split level house. She occasionally drinks coffee.   Social Determinants of Health   Financial Resource Strain: Low Risk    Difficulty of Paying Living Expenses: Not hard at all  Food Insecurity: No Food Insecurity   Worried About Charity fundraiser in the Last Year: Never true   [place] in the Last Year: Never true  Transportation Needs: No Transportation Needs   Lack of Transportation (Medical): No   Lack of Transportation  (Non-Medical): No  Physical Activity: Sufficiently Active   Days of Exercise per Week: 5 days   Minutes of Exercise per Session: 30 min  Stress: No Stress Concern Present   Feeling of Stress : Not at all  Social Connections: Moderately Integrated   Frequency of Communication with Friends and Family: More than three times a week   Frequency of Social Gatherings with Friends and Family: Three times a week   Attends Religious Services: Never   Active Member of Clubs or Organizations: Yes   Attends Archivist Meetings: Never   Marital Status: Married  Human resources officer Violence: Not At Risk   Fear of Current or Ex-Partner: No   Emotionally Abused: No   Physically Abused: No   Sexually Abused: No    Review of Systems:  All systems reviewed an negative except where noted in HPI.  Gen: Denies any fever, chills, sweats, anorexia, fatigue, weakness, malaise, weight loss, and sleep disorder CV: Denies chest pain, angina, palpitations, syncope, orthopnea, PND, peripheral edema, and claudication. Resp: Denies dyspnea at rest, dyspnea with exercise, cough, sputum, wheezing, coughing up blood, and pleurisy. GI: Denies vomiting blood, jaundice, and fecal incontinence.   Denies dysphagia or odynophagia. GU : Denies urinary burning, blood in urine, urinary frequency, urinary hesitancy, nocturnal urination, and urinary incontinence. MS: Denies joint pain, limitation of movement, and swelling, stiffness, low back pain, extremity pain. Denies muscle weakness, cramps, atrophy.  Derm: Denies rash, itching, dry skin, hives, moles, warts, or unhealing ulcers.  Psych: Denies depression, anxiety, memory loss, suicidal ideation, hallucinations, paranoia, and confusion. Heme: Denies bruising, bleeding, and enlarged lymph nodes. Neuro:  Denies any headaches, dizziness, paresthesias. Endo:  Denies any problems with DM, thyroid, adrenal function.   Physical Exam: Vital signs in last 24  hours: General:  Alert, well-developed, in NAD Head:  Normocephalic and atraumatic. Eyes:  Sclera clear, no icterus.   Conjunctiva pink. Ears:  Normal auditory acuity. Mouth:  No deformity or lesions.  Neck:  Supple; no masses . Lungs:  Clear throughout to auscultation.   No wheezes, crackles, or rhonchi. No acute distress. Heart:  Regular rate and rhythm; no murmurs. Abdomen:  Soft, nondistended, nontender. No masses, hepatomegaly. No obvious masses.  Normal bowel .    Rectal:  Deferred   Msk:  Symmetrical without gross deformities.. Pulses:  Normal pulses noted. Extremities:  Without edema. Neurologic:  Alert and  oriented x4;  grossly normal neurologically. Skin:  Intact without significant lesions or rashes. Cervical Nodes:  No significant cervical adenopathy. Psych:  Alert and cooperative. Normal mood and affect.   Impression / Plan:   Family history of colon polyps for colonoscopy.   Pricilla Riffle. Fuller Plan  09/03/2021, 9:45 AM See Shea Evans, [facility], to contact our on call provider

## 2021-09-03 NOTE — Op Note (Addendum)
[place] Patient Name: Jo Gomez Procedure Date: 09/03/2021 9:18 AM MRN: [id number] Endoscopist: Ladene Artist , MD Age: 76 Referring MD:  Date of Birth: 29-Jan-1946 Gender: Female Account #: [id number] Procedure:                Colonoscopy Indications:              Colon cancer screening in patient at increased                            risk: Family history of colon polyps in multiple                            1st-degree relatives Medicines:                Monitored Anesthesia Care Procedure:                Pre-Anesthesia Assessment:                           - Prior to the procedure, a History and Physical                            was performed, and patient medications and                            allergies were reviewed. The patient's tolerance of                            previous anesthesia was also reviewed. The risks                            and benefits of the procedure and the sedation                            options and risks were discussed with the patient.                            All questions were answered, and informed consent                            was obtained. Prior Anticoagulants: The patient has                            taken no previous anticoagulant or antiplatelet                            agents. ASA Grade Assessment: II - A patient with                            mild systemic disease. After reviewing the risks                            and benefits, the patient was deemed in  satisfactory condition to undergo the procedure.                           After obtaining informed consent, the colonoscope                            was passed under direct vision. Throughout the                            procedure, the patient's blood pressure, pulse, and                            oxygen saturations were monitored continuously. The                            CF [id number] #[id number] was introduced  through the anus                            and advanced to the the cecum, identified by                            appendiceal orifice and ileocecal valve. The                            ileocecal valve, appendiceal orifice, and rectum                            were photographed. The quality of the bowel                            preparation was good. The colonoscopy was somewhat                            difficult due to significant looping and a tortuous                            colon. The patient tolerated the procedure well.                            The Olympus [id number]-[id number] (#[id number]) Colonoscope was                            introduced through the and advanced to the. Scope In: 9:51:29 AM Scope Out: 10:18:45 AM Scope Withdrawal Time: 0 hours 11 minutes 22 seconds  Total Procedure Duration: 0 hours 27 minutes 16 seconds  Findings:                 The perianal and digital rectal examinations were                            normal.                           A few medium-mouthed diverticula were found in the  left colon. There was no evidence of diverticular                            bleeding.                           The exam was otherwise without abnormality on                            direct and retroflexion views. Complications:            No immediate complications. Estimated blood loss:                            None. Estimated Blood Loss:     Estimated blood loss: none. Impression:               - Mild diverticulosis in the left colon.                           - The examination was otherwise normal on direct                            and retroflexion views.                           - No specimens collected. Recommendation:           - Consider repeat colonoscopy in 5 years, vs no                            repeat colonoscopy due to age, for screening                            purposes.                           - Patient has a contact  number available for                            emergencies. The signs and symptoms of potential                            delayed complications were discussed with the                            patient. Return to normal activities tomorrow.                            Written discharge instructions were provided to the                            patient.                           - Resume previous diet.                           -  Continue present medications. Ladene Artist, MD 09/03/2021 10:23:00 AM This report has been signed electronically.

## 2021-09-05 ENCOUNTER — Telehealth: Payer: Self-pay | Admitting: *Deleted

## 2021-09-05 NOTE — Telephone Encounter (Signed)
°  Follow up Call-  Call back number 09/03/2021 06/17/2020  Post procedure Call Back phone  # [phone]  Permission to leave phone message Yes Yes  Some recent data might be hidden     Patient questions:  Do you have a fever, pain , or abdominal swelling? No. Pain Score  0 *  Have you tolerated food without any problems? Yes.    Have you been able to return to your normal activities? Yes.    Do you have any questions about your discharge instructions: Diet   No. Medications  No. Follow up visit  No.  Do you have questions or concerns about your Care? No.  Actions: * If pain score is 4 or above: No action needed, pain <4.

## 2021-09-08 ENCOUNTER — Other Ambulatory Visit: Payer: Self-pay | Admitting: Dermatology

## 2021-09-08 DIAGNOSIS — L409 Psoriasis, unspecified: Secondary | ICD-10-CM

## 2021-09-10 ENCOUNTER — Other Ambulatory Visit: Payer: Self-pay

## 2021-09-10 ENCOUNTER — Encounter: Payer: Self-pay | Admitting: Internal Medicine

## 2021-09-10 MED ORDER — RIZATRIPTAN BENZOATE 10 MG PO TABS: 10.0000 mg | ORAL_TABLET | ORAL | 4 refills | Status: DC | PRN

## 2021-09-11 DIAGNOSIS — M2569 Stiffness of other specified joint, not elsewhere classified: Secondary | ICD-10-CM | POA: Diagnosis not present

## 2021-09-11 DIAGNOSIS — M5416 Radiculopathy, lumbar region: Secondary | ICD-10-CM | POA: Diagnosis not present

## 2021-09-11 DIAGNOSIS — R293 Abnormal posture: Secondary | ICD-10-CM | POA: Diagnosis not present

## 2021-09-11 DIAGNOSIS — M6281 Muscle weakness (generalized): Secondary | ICD-10-CM | POA: Diagnosis not present

## 2021-09-11 DIAGNOSIS — M545 Low back pain, unspecified: Secondary | ICD-10-CM | POA: Diagnosis not present

## 2021-09-16 ENCOUNTER — Encounter: Payer: Self-pay | Admitting: Dermatology

## 2021-09-16 ENCOUNTER — Ambulatory Visit: Payer: Medicare PPO | Admitting: Dermatology

## 2021-09-16 ENCOUNTER — Other Ambulatory Visit: Payer: Self-pay

## 2021-09-16 DIAGNOSIS — L404 Guttate psoriasis: Secondary | ICD-10-CM

## 2021-09-16 DIAGNOSIS — R5383 Other fatigue: Secondary | ICD-10-CM | POA: Diagnosis not present

## 2021-09-16 DIAGNOSIS — Z5181 Encounter for therapeutic drug level monitoring: Secondary | ICD-10-CM | POA: Diagnosis not present

## 2021-09-17 ENCOUNTER — Encounter: Payer: Self-pay | Admitting: Dermatology

## 2021-09-17 DIAGNOSIS — R293 Abnormal posture: Secondary | ICD-10-CM | POA: Diagnosis not present

## 2021-09-17 DIAGNOSIS — M545 Low back pain, unspecified: Secondary | ICD-10-CM | POA: Diagnosis not present

## 2021-09-17 DIAGNOSIS — M5416 Radiculopathy, lumbar region: Secondary | ICD-10-CM | POA: Diagnosis not present

## 2021-09-17 DIAGNOSIS — M6281 Muscle weakness (generalized): Secondary | ICD-10-CM | POA: Diagnosis not present

## 2021-09-17 DIAGNOSIS — L409 Psoriasis, unspecified: Secondary | ICD-10-CM

## 2021-09-17 DIAGNOSIS — M2569 Stiffness of other specified joint, not elsewhere classified: Secondary | ICD-10-CM | POA: Diagnosis not present

## 2021-09-17 NOTE — Progress Notes (Signed)
Spoke with Drug Rep Marissa Scumaci [place] [facility] [phone]  [email or web address] She stated there is no contraindication with skyrizi and the IGA Deficiency. I will update Dr Denna Haggard with the information and we are still waiting on the patient to complete the lab work.

## 2021-09-18 MED ORDER — CLOBETASOL PROPIONATE 0.05 % EX OINT
TOPICAL_OINTMENT | CUTANEOUS | 0 refills | Status: DC
  Filled 2022-01-12: qty 60, 30d supply, fill #0

## 2021-09-19 LAB — CBC WITH DIFFERENTIAL/PLATELET
Absolute Monocytes: 582 cells/uL (ref 200–950)
Basophils Absolute: 30 cells/uL (ref 0–200)
Basophils Relative: 0.5 %
Eosinophils Absolute: 222 cells/uL (ref 15–500)
Eosinophils Relative: 3.7 %
HCT: 42.2 % (ref 35.0–45.0)
Hemoglobin: 14.2 g/dL (ref 11.7–15.5)
Lymphs Abs: 966 cells/uL (ref 850–3900)
MCH: 30.4 pg (ref 27.0–33.0)
MCHC: 33.6 g/dL (ref 32.0–36.0)
MCV: 90.4 fL (ref 80.0–100.0)
MPV: 9.9 fL (ref 7.5–12.5)
Monocytes Relative: 9.7 %
Neutro Abs: 4200 cells/uL (ref 1500–7800)
Neutrophils Relative %: 70 %
Platelets: 243 10*3/uL (ref 140–400)
RBC: 4.67 10*6/uL (ref 3.80–5.10)
RDW: 13 % (ref 11.0–15.0)
Total Lymphocyte: 16.1 %
WBC: 6 10*3/uL (ref 3.8–10.8)

## 2021-09-19 LAB — COMPREHENSIVE METABOLIC PANEL
AG Ratio: 1.8 (calc) (ref 1.0–2.5)
ALT: 22 U/L (ref 6–29)
AST: 23 U/L (ref 10–35)
Albumin: 4.3 g/dL (ref 3.6–5.1)
Alkaline phosphatase (APISO): 81 U/L (ref 37–153)
BUN: 13 mg/dL (ref 7–25)
CO2: 29 mmol/L (ref 20–32)
Calcium: 9.2 mg/dL (ref 8.6–10.4)
Chloride: 100 mmol/L (ref 98–110)
Creat: 0.86 mg/dL (ref 0.60–1.00)
Globulin: 2.4 g/dL (calc) (ref 1.9–3.7)
Glucose, Bld: 93 mg/dL (ref 65–139)
Potassium: 4.1 mmol/L (ref 3.5–5.3)
Sodium: 134 mmol/L — ABNORMAL LOW (ref 135–146)
Total Bilirubin: 0.2 mg/dL (ref 0.2–1.2)
Total Protein: 6.7 g/dL (ref 6.1–8.1)

## 2021-09-19 LAB — HEPATITIS C ANTIBODY
Hepatitis C Ab: NONREACTIVE
SIGNAL TO CUT-OFF: 0.02 (ref <1.00)

## 2021-09-19 LAB — QUANTIFERON-TB GOLD PLUS
Mitogen-NIL: >10 IU/mL
NIL: 0.03 IU/mL
QuantiFERON-TB Gold Plus: NEGATIVE
TB1-NIL: 0 IU/mL
TB2-NIL: 0 IU/mL

## 2021-09-19 LAB — HEPATITIS B SURFACE ANTIGEN
Confirmation: NONREACTIVE
Hepatitis B Surface Ag: REACTIVE — AB

## 2021-09-19 LAB — HEPATITIS B SURFACE ANTIBODY, QUANTITATIVE: Hep B S AB Quant (Post): <5 m[IU]/mL — ABNORMAL LOW (ref >=10)

## 2021-09-19 LAB — HEPATITIS B CORE ANTIBODY, TOTAL: Hep B Core Total Ab: NONREACTIVE

## 2021-09-19 LAB — ANA: Anti Nuclear Antibody (ANA): NEGATIVE

## 2021-09-22 ENCOUNTER — Telehealth: Payer: Self-pay | Admitting: *Deleted

## 2021-09-22 ENCOUNTER — Encounter: Payer: Self-pay | Admitting: Internal Medicine

## 2021-09-22 DIAGNOSIS — R768 Other specified abnormal immunological findings in serum: Secondary | ICD-10-CM

## 2021-09-22 NOTE — Telephone Encounter (Signed)
-----   Message from Lavonna Monarch, MD sent at 09/19/2021  7:57 AM EST ----- Her hepatitis B ("surface antigen") test changed from negative (one year ago) to positive. She needs to see her PCP to have this evaluated before we can initiate biologic therapy.

## 2021-09-22 NOTE — Telephone Encounter (Signed)
Called patient to inform her of dr tafeens recommendation regarding her bloodwork. Patient states that she will follow up with PCP.

## 2021-09-26 ENCOUNTER — Other Ambulatory Visit: Payer: Medicare PPO

## 2021-09-26 DIAGNOSIS — R768 Other specified abnormal immunological findings in serum: Secondary | ICD-10-CM | POA: Diagnosis not present

## 2021-09-30 LAB — HEPATITIS B SURFACE ANTIBODY,QUALITATIVE: Hep B S Ab: NONREACTIVE

## 2021-09-30 LAB — HEPATITIS B SURFACE ANTIGEN
Confirmation: NONREACTIVE
Hepatitis B Surface Ag: REACTIVE — AB

## 2021-09-30 LAB — HEPATITIS B CORE ANTIBODY, TOTAL: Hep B Core Total Ab: NONREACTIVE

## 2021-10-01 NOTE — Addendum Note (Signed)
Addended by: Binnie Rail on: 10/01/2021 07:16 AM   Modules accepted: Orders

## 2021-10-05 ENCOUNTER — Encounter: Payer: Self-pay | Admitting: Dermatology

## 2021-10-05 NOTE — Progress Notes (Signed)
° °  Follow-Up Visit   Subjective  Jo Gomez is a 76 y.o. female who presents for the following: Psoriasis (Psoriasis not improved on the back tac and ketoconazole, clobetasol bid. Patient stated the foam burns .).  Psoriasis, extensive, unresponsive to therapy thus far, significant negative impact on her life. Location:  Duration:  Quality:  Associated Signs/Symptoms: Modifying Factors:  Severity:  Timing: Context:   Objective  Well appearing patient in no apparent distress; mood and affect are within normal limits. Left Arm, Left Leg, Right Arm, Right Leg 20% BSA small plaque plus guttate psoriasis.  No synovitis.  No nail involvement.  Essentially all treatment options discussed from alternative topicals to Biologics, [place] [facility].  Briefly discussed the new JAK inhibitors but I am not comfortable with this choice.    All skin waist up examined.  Areas beneath undergarment not fully examined, also examined legs.   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 I will update Dr Denna Haggard with the information and we are still waiting on the patient to complete the lab work.  Addendum after patient seen: I did a literature search and heard back from [facility]: IgA deficiency is not a contraindication to [employer].    Comprehensive metabolic panel - Left Arm, Left Leg, Right Arm, Right Leg  CBC with Differential/Platelet - Left Arm, Left Leg, Right Arm, Right Leg  Related Procedures Hepatitis B surface antibody,quantitative Hepatitis B surface antigen Hepatitis B core antibody, total Hepatitis C  antibody QuantiFERON-TB Gold Plus ANA  Encounter for therapeutic drug monitoring  Related Procedures Comprehensive metabolic panel CBC with Differential/Platelet Hepatitis B surface antibody,quantitative Hepatitis B surface antigen Hepatitis B core antibody, total Hepatitis C antibody QuantiFERON-TB Gold Plus ANA  Other fatigue  Related Procedures Comprehensive metabolic panel CBC with Differential/Platelet Hepatitis B surface antibody,quantitative Hepatitis B surface antigen Hepatitis B core antibody, total Hepatitis C antibody QuantiFERON-TB Gold Plus ANA      I, Lavonna Monarch, MD, have reviewed all documentation for this visit.  The documentation on 10/05/21 for the exam, diagnosis, procedures, and orders are all accurate and complete.

## 2021-10-06 ENCOUNTER — Ambulatory Visit: Payer: Medicare PPO | Admitting: Infectious Diseases

## 2021-10-06 ENCOUNTER — Other Ambulatory Visit: Payer: Self-pay

## 2021-10-06 ENCOUNTER — Encounter: Payer: Self-pay | Admitting: Infectious Diseases

## 2021-10-06 VITALS — BP 122/66 | HR 71 | Temp 98.3°F | Ht 60.0 in | Wt 116.0 lb

## 2021-10-06 DIAGNOSIS — R768 Other specified abnormal immunological findings in serum: Secondary | ICD-10-CM | POA: Diagnosis not present

## 2021-10-06 NOTE — Assessment & Plan Note (Addendum)
Jo Gomez is here for further clarification of recently newly reactive Hepatitis B Surface Antigen blood test.  Her risk of acquiring hepatitis B infection seems very low. Reviewed GI records from the small window of time she had RUQ pain and mildly elevated LFTs - seems very low risk for acute hepatitis at that point and had a negative hep b sAg late December 2021. I most suspect she has had a false positive preliminary hepatitis b surface antigen given that her hepatitis b core total antibody is nonreactive and confirmation of sAg was not able to be replicated based on the lab assay ordered.  We discussed that it is hard to know specifically what biologic pre-dispositions can cause these labs to cross-react.  Will get more information and check hepatitis b pcr, hepatitis e antibody/antigen and quantitative surface antigen.   Will let her know lab test results and further recommendations from there.

## 2021-10-06 NOTE — Patient Instructions (Addendum)
I suspect that your hepatitis b screening test is probably a false positive.   Will do a few more tests to give [place] more information.   Will let you know your results as they come in!

## 2021-10-06 NOTE — Progress Notes (Signed)
Patient Name: Jo Gomez  Date of Birth: 10/06/1945  MRN: [id number]  PCP: Binnie Rail, MD  Referring Provider: Binnie Rail, MD, Ph#: [phone]   Patient Active Problem List   Diagnosis Date Noted   Hepatitis B surface antigen positive 10/06/2021   Mild persistent asthma without complication 46/27/0350   Bilateral hand pain 05/14/2021   Myalgia 04/17/2021   Polyarthralgia 04/17/2021   Heartburn 03/31/2021   Psoriasis 02/12/2021   Nasal sore 01/15/2021   Lumbar radiculopathy, right 01/15/2021   Elevated LFTs 09/10/2020   RUQ pain 07/02/2020   Urine frequency 07/02/2020   History of frequent upper respiratory infection 04/15/2020   Nonallergic rhinitis 04/15/2020   Chronic bronchitis ([place]) 02/01/2019   Migraine headache 01/31/2019   Hyperglycemia 01/30/2019   Chronic cough 01/02/2019   Cardiac murmur 09/29/2016   Pain of both shoulder joints 06/24/2016   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 female referred after she was screened with hepatitis B surface antigen for use of biologics to help with guttate psoriasis management.   Hep B sAg REACTIVE 2 weeks ago (with negative confirmatory assay) and negative total core antibody. No immunity to hepatitis b. Liver function testing has been normal recently but was elevated in October - December 2021. She went to see GI team at this time for nausea and epigastric abdominal pain at that time. She does not recall any yellowing of the eyes /  skin. Reviewed medical records - she was having several months of epigastric pain that radiated to her back with increased nausea, belching and burping. Eating made the pain worse then. Some mild tenderness of RUQ on exam. EGD with small hiatal hernia gastric polyps, normal esophagus and duodenum. [place] of abdomen was negative for masses of liver and normal appearing GB. HIDA scan was normal.    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  Married to a woman (22 year relationship).   She has a history of IgA deficiency  - used to get a lot of URI in the past but much better the last few years with wearing a mask.  Quantiferon GOLD negative.  HIV historically non reactive.    Lab Results  Component Value Date   ALT 22 09/16/2021   AST 23 09/16/2021   ALKPHOS 61 02/13/2021   BILITOT 0.2 09/16/2021    Review of Systems  Constitutional:  Negative for appetite change, fatigue, fever and unexpected weight change.  Respiratory:  Negative for shortness of breath.   Cardiovascular:  Negative for chest pain and leg swelling.  Gastrointestinal:  Negative for abdominal pain, blood in stool, nausea and vomiting.       Still with GERD sx  Genitourinary:  Negative for difficulty urinating and hematuria.  Musculoskeletal:  Negative for arthralgias.  Skin:  Negative for color change.  Neurological:  Negative for dizziness, tremors and headaches.  Hematological:  Negative for adenopathy.  Psychiatric/Behavioral:  Negative for confusion.       Past Medical  History:  Diagnosis Date   Allergy    Anxiety    Arthritis    arthritic cysts in back/shoulder   Asthma    Depression    GERD (gastroesophageal reflux disease)    diet controlled, no med   History of endoscopy    HSV infection    IgA deficiency ([facility])    Low vitamin B12 level    Osteoporosis    Ovarian cyst    TAH/BSO   Shingles    Thyroid dysfunction     Prior to Admission medications   Medication Sig Start Date End Date  Taking? Authorizing Provider  clobetasol ointment (TEMOVATE) 0.05 % Apply to AA 1-2 times a day (not for face or skin folds) large surface area 09/18/21   Lavonna Monarch, MD  cyanocobalamin (,VITAMIN B-12,) 1000 MCG/ML injection INJECT 1ML UNDER THE SKIN EVERY 30 DAYS 06/30/21   Binnie Rail, MD  estrogen-methylTESTOSTERone (EST ESTROGENS-METHYLTEST HS) 0.625-1.25 MG tablet TAKE 1 TABLET BY MOUTH 3 TIMES WEEKLY 06/27/21   Megan Salon, MD  fluticasone (FLOVENT HFA) 110 MCG/ACT inhaler Inhale 2 puffs into the lungs daily. And rinse mouth after each use. Patient taking differently: Inhale 2 puffs into the lungs daily. And rinse mouth after each use. 1 puff daily 03/31/21   Garnet Sierras, DO  gabapentin (NEURONTIN) 100 MG capsule gabapentin 100 mg capsule Patient not taking: Reported on 08/12/2021    [provider]  gabapentin (NEURONTIN) 300 MG capsule gabapentin 300 mg capsule    [provider]  levothyroxine (SYNTHROID) 75 MCG tablet TAKE 1 TABLET(75 MCG) BY MOUTH DAILY BEFORE BREAKFAST 01/17/21   Burns, Claudina Lick, MD  methocarbamol (ROBAXIN) 500 MG tablet methocarbamol 500 mg tablet  TAKE 1 TABLET BY MOUTH FOUR TIMES DAILY 08/05/21   [provider]  rizatriptan (MAXALT) 10 MG tablet Take 1 tablet (10 mg total) by mouth as needed for migraine. May repeat in 2 hours if needed 09/10/21   Binnie Rail, MD  sertraline (ZOLOFT) 100 MG tablet sertraline 100 mg tablet    [provider]  sodium chloride (OCEAN) 0.65 % SOLN nasal spray Place 1 spray into both nostrils as needed for congestion. 07/28/16   Nche, Charlene Brooke, NP  traZODone (DESYREL) 150 MG tablet TAKE 1 TABLET(150 MG) BY MOUTH AT BEDTIME 04/17/21   Burns, Claudina Lick, MD  triamcinolone cream (KENALOG) 0.1 %     [provider]  TUBERCULIN SYR 1CC/25GX5/8" (SAFETY-LOK TB SYR 1CC/25GX5/8") 25G X 5/8" 1 ML MISC USE AS DIRECTED FOR B-12 INJECTIONS. 11/06/17   Burns, Claudina Lick, MD  valACYclovir (VALTREX) 1000 MG  tablet TAKE 2 TABLETS BY MOUTH EVERY 12 HOURS FOR 1 DAY AS NEEDED 02/07/20   Binnie Rail, MD  Vitamin D, Ergocalciferol, (DRISDOL) 1.25 MG (50000 UNIT) CAPS capsule TAKE 1 CAPSULE BY MOUTH EVERY OTHER WEEK 06/25/21   Megan Salon, MD    Allergies  Allergen Reactions   Gammagard S-D Less Iga [Immune Globulin (Human)] Anaphylaxis    Have to have triple washed blood products due to this allergy pt states   Clarithromycin Other (See Comments)    Severe leg and muscle cramps   Erythromycin Base Nausea And Vomiting   Codeine Rash    Syrup only, can take hydrocodone, oxycodone   Levaquin [Levofloxacin In D5w] Other (See Comments)    tendonitis   Other Rash and Other (See Comments)    IGA Deficiency - catches a lot of viruses  Sulfonamide Derivatives Rash   Tetracycline Rash    Social History   Tobacco Use   Smoking status: Former    Packs/day: 0.50    Years: 3.00    Pack years: 1.50    Types: Cigarettes    Quit date: 08/24/1968    Years since quitting: 53.1   Smokeless tobacco: Never   Tobacco comments:    in college  Vaping Use   Vaping Use: Never used  Substance Use Topics   Alcohol use: Yes    Alcohol/week: 2.0 standard drinks    Types: 2 Standard drinks or equivalent per week   Drug use: No    Family History  Problem Relation Age of Onset   Heart attack Mother    Osteoporosis Mother    Lung cancer Mother    Colon polyps Mother    Arthritis Mother    COPD Father        chronic lung infections   Colon polyps Father    Colon cancer Neg Hx    Esophageal cancer Neg Hx    Stomach cancer Neg Hx    Rectal cancer Neg Hx    Breast cancer Neg Hx      Objective:   Vitals:   10/06/21 1403  BP: 122/66  Pulse: 71  Temp: 98.3 F (36.8 C)   Physical Exam Constitutional:      Appearance: Normal appearance. She is not ill-appearing.  HENT:     Mouth/Throat:     Mouth: Mucous membranes are moist.     Pharynx: Oropharynx is clear.  Eyes:     General: No scleral  icterus. Cardiovascular:     Rate and Rhythm: Normal rate.  Pulmonary:     Effort: Pulmonary effort is normal.  Neurological:     Mental Status: She is oriented to person, place, and time.  Psychiatric:        Mood and Affect: Mood normal.        Thought Content: Thought content normal.     Laboratory: Genotype: No results found for: HCVGENOTYPE HCV viral load: No results found for: HCVQUANT Lab Results  Component Value Date   WBC 6.0 09/16/2021   HGB 14.2 09/16/2021   HCT 42.2 09/16/2021   MCV 90.4 09/16/2021   PLT 243 09/16/2021    Lab Results  Component Value Date   CREATININE 0.86 09/16/2021   BUN 13 09/16/2021   NA 134 (L) 09/16/2021   K 4.1 09/16/2021   CL 100 09/16/2021   CO2 29 09/16/2021    Lab Results  Component Value Date   ALT 22 09/16/2021   AST 23 09/16/2021   ALKPHOS 61 02/13/2021    Lab Results  Component Value Date   BILITOT 0.2 09/16/2021   ALBUMIN 4.1 02/13/2021    Imaging:  Abd  U/S 06-2020:  Liver:  No focal lesion identified. Within normal limits in parenchymal echogenicity. Portal vein is patent on color Doppler imaging with normal direction of blood flow towards the liver.    Assessment & Plan:   Problem List Items Addressed This Visit       Unprioritized   Hepatitis B surface antigen positive - Primary    Jazalynn is here for further clarification of recently newly reactive Hepatitis B Surface Antigen blood test.  Her risk of acquiring hepatitis B infection seems very low. Reviewed GI records from the small window of time she had RUQ pain and mildly elevated LFTs - seems very low risk for acute hepatitis  at that point and had a negative hep b sAg late December 2021. I most suspect she has had a false positive preliminary hepatitis b surface antigen given that her hepatitis b core total antibody is nonreactive and confirmation of sAg was not able to be replicated based on the lab assay ordered.  We discussed that it is hard to know  specifically what biologic pre-dispositions can cause these labs to cross-react.  Will get more information and check hepatitis b pcr, hepatitis e antibody/antigen and quantitative surface antigen.   Will let her know lab test results and further recommendations from there.       Relevant Orders   Hepatitis B DNA, ultraquantitative, PCR   Hepatitis B surface antigen   Hepatitis B e antigen   Hepatitis B e antibody    I spent 45 minutes with the patient including greater than 70% of time in face to face counsel of the patient re hepatitis c and the details described above and in coordination of their care.  Janene Madeira, MSN, NP-C [facility] [email or web address] Pager: [phone] Office: [place]: [phone]

## 2021-10-08 LAB — HEPATITIS B DNA, ULTRAQUANTITATIVE, PCR
Hepatitis B DNA (Calc): <1 Log IU/mL
Hepatitis B DNA: <10 IU/mL

## 2021-10-08 LAB — HEPATITIS B E ANTIGEN: Hep B E Ag: NONREACTIVE

## 2021-10-08 LAB — HEPATITIS B SURFACE ANTIGEN
Confirmation: NONREACTIVE
Hepatitis B Surface Ag: REACTIVE — AB

## 2021-10-08 LAB — HEPATITIS B E ANTIBODY: Hep B E Ab: NONREACTIVE

## 2021-10-10 ENCOUNTER — Ambulatory Visit (INDEPENDENT_AMBULATORY_CARE_PROVIDER_SITE_OTHER): Payer: Medicare PPO

## 2021-10-10 ENCOUNTER — Other Ambulatory Visit: Payer: Self-pay

## 2021-10-10 DIAGNOSIS — Z23 Encounter for immunization: Secondary | ICD-10-CM | POA: Diagnosis not present

## 2021-10-10 NOTE — Progress Notes (Signed)
Patient came in for Hep B vaccine.  Heplisav-B Vaccine was given in the right deltoid. Patient tolerated well.

## 2021-10-14 ENCOUNTER — Other Ambulatory Visit: Payer: Self-pay | Admitting: Internal Medicine

## 2021-10-15 ENCOUNTER — Ambulatory Visit: Payer: Medicare PPO | Admitting: Internal Medicine

## 2021-10-21 ENCOUNTER — Encounter: Payer: Self-pay | Admitting: Dermatology

## 2021-10-23 ENCOUNTER — Telehealth: Payer: Self-pay | Admitting: *Deleted

## 2021-10-23 NOTE — Telephone Encounter (Signed)
Skyrizi information sent to [facility] portal with office notes.  ?

## 2021-10-27 ENCOUNTER — Telehealth: Payer: Self-pay | Admitting: *Deleted

## 2021-10-27 NOTE — Telephone Encounter (Signed)
Prior authorization for skyrizi approved- 08/24/2021-08/23/2022  ? ?PA # [id number] ?

## 2021-10-27 NOTE — Telephone Encounter (Signed)
Patient made aware of Dr Delma Officer note and we will forward all information to skyrizi . ?

## 2021-10-28 ENCOUNTER — Telehealth: Payer: Self-pay | Admitting: *Deleted

## 2021-10-28 NOTE — Telephone Encounter (Signed)
[facility] sent fax stating they have completed welcome call and will contact patient to scheduled delivery.  ?

## 2021-10-29 DIAGNOSIS — M5136 Other intervertebral disc degeneration, lumbar region: Secondary | ICD-10-CM | POA: Diagnosis not present

## 2021-10-29 DIAGNOSIS — M4807 Spinal stenosis, lumbosacral region: Secondary | ICD-10-CM | POA: Diagnosis not present

## 2021-10-29 DIAGNOSIS — R030 Elevated blood-pressure reading, without diagnosis of hypertension: Secondary | ICD-10-CM | POA: Diagnosis not present

## 2021-11-04 ENCOUNTER — Other Ambulatory Visit (HOSPITAL_COMMUNITY): Payer: Self-pay

## 2021-11-04 ENCOUNTER — Telehealth: Payer: Self-pay | Admitting: Dermatology

## 2021-11-04 MED ORDER — SKYRIZI PEN 150 MG/ML ~~LOC~~ SOAJ
150.0000 mg | SUBCUTANEOUS | 2 refills | Status: DC
Start: 2021-11-04 — End: 2022-04-21
  Filled 2021-11-04: qty 1, fill #0
  Filled 2021-11-05: qty 1, 28d supply, fill #0
  Filled 2021-11-25: qty 1, 28d supply, fill #1
  Filled 2021-12-22: qty 1, 28d supply, fill #2
  Filled 2022-02-17: qty 1, 84d supply, fill #2

## 2021-11-04 NOTE — Telephone Encounter (Signed)
Phone call from patient. She does not want to use [facility] because she does not feel comfortable giving her information to them. She said she called [facility] and they can fill it for her. I sent it there per patient.  ?

## 2021-11-04 NOTE — Telephone Encounter (Signed)
Patient is calling back with the pharmacy information.  [name] is the pharmacy that she wants the prescription for [facility] sent in to. ?

## 2021-11-05 ENCOUNTER — Other Ambulatory Visit (HOSPITAL_COMMUNITY): Payer: Self-pay

## 2021-11-05 ENCOUNTER — Telehealth: Payer: Self-pay | Admitting: *Deleted

## 2021-11-05 NOTE — Telephone Encounter (Signed)
Fax from [facility] stating they discontinued skyrizi prescription because patient wanted to use local pharmacy. I called patient and she states that she is getting skyrizi from [facility].  ?

## 2021-11-06 ENCOUNTER — Other Ambulatory Visit (HOSPITAL_COMMUNITY): Payer: Self-pay

## 2021-11-07 ENCOUNTER — Other Ambulatory Visit (HOSPITAL_COMMUNITY): Payer: Self-pay

## 2021-11-07 MED ORDER — TRIAMCINOLONE ACETONIDE 0.1 % EX CREA: TOPICAL_CREAM | CUTANEOUS | 2 refills | Status: DC

## 2021-11-07 MED ORDER — MUPIROCIN 2 % EX OINT: TOPICAL_OINTMENT | CUTANEOUS | 0 refills | Status: DC

## 2021-11-07 MED ORDER — DULOXETINE HCL 30 MG PO CPEP: 30.0000 mg | ORAL_CAPSULE | Freq: Every day | ORAL | 1 refills | Status: DC

## 2021-11-07 MED ORDER — CLOBETASOL PROPIONATE 0.05 % EX FOAM: CUTANEOUS | 0 refills | Status: DC

## 2021-11-07 MED ORDER — GABAPENTIN 100 MG PO CAPS: ORAL_CAPSULE | ORAL | 2 refills | Status: DC

## 2021-11-10 ENCOUNTER — Other Ambulatory Visit (HOSPITAL_COMMUNITY): Payer: Self-pay

## 2021-11-13 NOTE — Progress Notes (Signed)
? ? ?Subjective:  ? ? Patient ID: Jo Gomez, female    DOB: June 06, 1946, 76 y.o.   MRN: [id number] ? ?This visit occurred during the SARS-CoV-2 public health emergency.  Safety protocols were in place, including screening questions prior to the visit, additional usage of staff PPE, and extensive cleaning of exam room while observing appropriate contact time as indicated for disinfecting solutions. ? ? ? ?HPI ?[name] is here for  ?Chief Complaint  ?Patient presents with  ? Psoriasis  ? ? ?She is taking Hotel [employer].  After taking it she felt achy, fatigue.  Those side effects have passed.  She still uses the steroid ointment on her lesions and that helps some. ? ?There is a little part of her that wonders about her diagnosis and is concerned if this is the best thing to do or if she should do light therapy.  She wonders about seeing a second dermatologist for their opinion ? ? ? ? ? ?Medications and allergies reviewed with patient and updated if appropriate. ? ?Current Outpatient Medications on File Prior to Visit  ?Medication Sig Dispense Refill  ? clobetasol (OLUX) 0.05 % topical foam Apply to affected areas 1-2 times a day 50 g 0  ? clobetasol ointment (TEMOVATE) 0.05 % Apply to the affected area 1-2 times a day (do not use on face, skin folds or large surface area) 60 g 0  ? cyanocobalamin (,VITAMIN B-12,) 1000 MCG/ML injection INJECT 1ML UNDER THE SKIN EVERY 30 DAYS 3 mL 1  ? estrogen-methylTESTOSTERone (EST ESTROGENS-METHYLTEST HS) 0.625-1.25 MG tablet TAKE 1 TABLET BY MOUTH 3 TIMES WEEKLY 36 tablet 1  ? fluticasone (FLOVENT HFA) 110 MCG/ACT inhaler Inhale 2 puffs into the lungs daily. Rinse mouth after each use. 12 g 3  ? levothyroxine (SYNTHROID) 75 MCG tablet TAKE 1 TABLET(75 MCG) BY MOUTH DAILY BEFORE BREAKFAST 90 tablet 1  ? mupirocin ointment (BACTROBAN) 2 % Apply in the nose 2 times a day 22 g 0  ? Risankizumab-rzaa (SKYRIZI PEN) 150 MG/ML SOAJ Inject 150 mg into the skin as directed. At weeks 0 & 4. 1  mL 2  ? rizatriptan (MAXALT) 10 MG tablet Take 1 tablet (10 mg total) by mouth as needed for migraine. May repeat in 2 hours if needed 10 tablet 4  ? sertraline (ZOLOFT) 100 MG tablet TAKE 2 TABLETS(200 MG) BY MOUTH DAILY 180 tablet 0  ? sodium chloride (OCEAN) 0.65 % SOLN nasal spray Place 1 spray into both nostrils as needed for congestion. 15 mL 0  ? traZODone (DESYREL) 150 MG tablet TAKE 1 TABLET(150 MG) BY MOUTH AT BEDTIME 90 tablet 2  ? triamcinolone cream (KENALOG) 0.1 % Apply to affected area 2 times a day as needed 80 g 2  ? TUBERCULIN SYR 1CC/25GX5/8" (SAFETY-LOK TB SYR 1CC/25GX5/8") 25G X 5/8" 1 ML MISC USE AS DIRECTED FOR B-12 INJECTIONS. 20 each 3  ? valACYclovir (VALTREX) 1000 MG tablet TAKE 2 TABLETS BY MOUTH EVERY 12 HOURS FOR 1 DAY AS NEEDED 20 tablet 3  ? Vitamin D, Ergocalciferol, (DRISDOL) 1.25 MG (50000 UNIT) CAPS capsule TAKE 1 CAPSULE BY MOUTH EVERY OTHER WEEK 6 capsule 3  ? ?No current facility-administered medications on file prior to visit.  ? ? ?Review of Systems ? ?   ?Objective:  ? ?Vitals:  ? 11/14/21 1426  ?BP: 126/80  ?Pulse: 72  ?Temp: 98.3 ?F (36.8 ?C)  ?SpO2: 97%  ? ?BP Readings from Last 3 Encounters:  ?11/14/21 126/80  ?10/06/21 122/66  ?  09/03/21 (!) 126/58  ? ?Wt Readings from Last 3 Encounters:  ?11/14/21 115 lb 3.2 oz (52.3 kg)  ?10/06/21 116 lb (52.6 kg)  ?09/03/21 117 lb (53.1 kg)  ? ?Body mass index is 22.5 kg/m?. ? ?  ?Physical Exam ?   ? ? ? ? ? ?Assessment & Plan:  ? ? ?See Problem List for Assessment and Plan of chronic medical problems.  ? ? ? ? ?

## 2021-11-14 ENCOUNTER — Other Ambulatory Visit: Payer: Self-pay

## 2021-11-14 ENCOUNTER — Encounter: Payer: Self-pay | Admitting: Internal Medicine

## 2021-11-14 ENCOUNTER — Ambulatory Visit: Payer: Medicare PPO | Admitting: Internal Medicine

## 2021-11-14 DIAGNOSIS — L409 Psoriasis, unspecified: Secondary | ICD-10-CM | POA: Diagnosis not present

## 2021-11-14 NOTE — Patient Instructions (Signed)
? ? ? ?  See Azzie Roup.   ? ?

## 2021-11-14 NOTE — Assessment & Plan Note (Signed)
Chronic ?Her psoriasis has gotten much worse since I saw her last and now she has it throughout most of her body ?It is painful, itchy and affecting her quality of life ?She is using steroid ointment and just started [employer] injections-it will take 4 months to see improvement from the injections ?She does wonder if she truly has psoriasis and worries about the treatment she is started ?I think a second opinion would be a good idea-this would help provide confidence in what she is doing and answer any questions that she may have regarding it-she will look into seeing dermatologist at [facility] ?

## 2021-11-17 DIAGNOSIS — M5117 Intervertebral disc disorders with radiculopathy, lumbosacral region: Secondary | ICD-10-CM | POA: Diagnosis not present

## 2021-11-17 DIAGNOSIS — M5416 Radiculopathy, lumbar region: Secondary | ICD-10-CM | POA: Diagnosis not present

## 2021-11-18 ENCOUNTER — Other Ambulatory Visit (HOSPITAL_COMMUNITY): Payer: Self-pay

## 2021-11-19 ENCOUNTER — Other Ambulatory Visit (HOSPITAL_COMMUNITY): Payer: Self-pay

## 2021-11-25 ENCOUNTER — Other Ambulatory Visit (HOSPITAL_COMMUNITY): Payer: Self-pay

## 2021-11-29 MED FILL — Levothyroxine Sodium Tab 75 MCG: ORAL | 90 days supply | Qty: 90 | Fill #0 | Status: AC

## 2021-12-01 ENCOUNTER — Other Ambulatory Visit (HOSPITAL_COMMUNITY): Payer: Self-pay

## 2021-12-02 ENCOUNTER — Other Ambulatory Visit (HOSPITAL_COMMUNITY): Payer: Self-pay

## 2021-12-08 ENCOUNTER — Ambulatory Visit: Payer: Medicare PPO

## 2021-12-17 ENCOUNTER — Telehealth: Payer: Self-pay | Admitting: Dermatology

## 2021-12-17 ENCOUNTER — Ambulatory Visit (INDEPENDENT_AMBULATORY_CARE_PROVIDER_SITE_OTHER): Payer: Medicare PPO

## 2021-12-17 DIAGNOSIS — Z23 Encounter for immunization: Secondary | ICD-10-CM

## 2021-12-17 NOTE — Progress Notes (Signed)
After obtaining consent, and per orders of Dr. Quay Burow, injection of HepB given by Max Sane. Patient tolerated injection well in the right deltoid and was told to report any adverse reaction to me immediately.  ?

## 2021-12-17 NOTE — Telephone Encounter (Signed)
Called patient to see what was going on- patient thinks/stated could something be wrong with 2nd skyrizi injections because she is having a flare up.  Per dr tafeen okay for patient to use steroid cream for "flare up. Patient stated she does already have a steroid prescription but her skin on her legs are already so thin and bruised. Informed patient of dr tafeens advise and told her to call back if needed.   ?

## 2021-12-17 NOTE — Telephone Encounter (Signed)
Wants to talk to Jo Gomez. On skyrizi. After 1st injection psoriasis almost all went away; took 2nd injection 2 weeks ago and it's coming back. She's wondering what's going on + if there may have been something wrong with the 2nd injection. (Told her it would probably be a nurse calling her back) ?

## 2021-12-19 ENCOUNTER — Other Ambulatory Visit: Payer: Self-pay | Admitting: Nurse Practitioner

## 2021-12-19 DIAGNOSIS — J0140 Acute pansinusitis, unspecified: Secondary | ICD-10-CM

## 2021-12-22 ENCOUNTER — Other Ambulatory Visit (HOSPITAL_COMMUNITY): Payer: Self-pay

## 2021-12-24 DIAGNOSIS — G9389 Other specified disorders of brain: Secondary | ICD-10-CM | POA: Diagnosis not present

## 2021-12-24 DIAGNOSIS — Q046 Congenital cerebral cysts: Secondary | ICD-10-CM | POA: Diagnosis not present

## 2021-12-31 ENCOUNTER — Telehealth: Payer: Self-pay

## 2021-12-31 ENCOUNTER — Other Ambulatory Visit (HOSPITAL_COMMUNITY): Payer: Self-pay

## 2021-12-31 DIAGNOSIS — L409 Psoriasis, unspecified: Secondary | ICD-10-CM

## 2021-12-31 MED ORDER — SKYRIZI PEN 150 MG/ML ~~LOC~~ SOAJ
150.0000 mg | SUBCUTANEOUS | 4 refills | Status: DC
  Filled 2021-12-31: qty 1, 28d supply, fill #0

## 2021-12-31 NOTE — Telephone Encounter (Signed)
Phone call from patient's pharmacy stating they are needing the maintenance dose for the patient's Skyrizi. Prescription escribed to patient's pharmacy.  ?

## 2022-01-02 ENCOUNTER — Other Ambulatory Visit (HOSPITAL_COMMUNITY): Payer: Self-pay

## 2022-01-04 NOTE — Progress Notes (Signed)
? ?Follow Up Note ? ?RE: Jo Gomez MRN: [id number] DOB: 10-Aug-1946 ?Date of Office Visit: 01/05/2022 ? ?Referring provider: Binnie Rail, MD ?Primary care provider: Binnie Rail, MD ? ?Chief Complaint: Follow-up ? ?History of Present Illness: ?I had the pleasure of seeing Jo Gomez for a follow up visit at the [facility] on 01/05/2022. She is a 76 y.o. female, who is being followed for asthma, IgA deficiency, nonallergic rhinitis, heartburn and psoriasis. Her previous allergy office visit was on 07/07/2021 with Dr. Maudie Gomez. Today is a regular follow up visit. ? ?Mild persistent asthma ?Currently on Flovent 169mg 1-2 puffs once a day and rinses mouth after each use.  ?Sometimes has coughing and phlegm and then takes 2 puffs during those days. She thinks it helps.  ?No albuterol use.  ? ?Denies any ER/urgent care visits or prednisone use since the last visit. ? ?Patient has been walking more lately.  ?  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  ?  ?Nonallergic rhinitis ?Asymptomatic. Using saline nasal spray only as needed.  ?  ?Heartburn ?Better controlled with diet. ?  ?Psoriasis ?Follows with dermatology and taking skyrizi injections now which seems to be helping.  ?  ?Assessment and Plan: ?Jo Gomez a 76y.o. female with: ?Mild persistent asthma without complication ?Past history - Respiratory symptoms with URIs mainly for the past 10 years. Uses albuterol in these situations with good benefit. 2021 spirometry was normal. ?Interim history - uses Flovent 1162m 1-2 puffs per day with good benefit.  ?Today's spirometry showed some restriction.  ?Daily controller medication(s): Flovent 11064m1-2 puffs once a day with spacer and rinse mouth afterwards. ?During upper respiratory infections: start Flovent 110m29m puffs twice a day for 1-2 weeks until your breathing symptoms return to baseline.  ?May use albuterol rescue inhaler 2 puffs every 4 to 6 hours as needed for  shortness of breath, chest tightness, coughing, and wheezing. May use albuterol rescue inhaler 2 puffs 5 to 15 minutes prior to strenuous physical activities. Monitor frequency of use.  ?Get spirometry at next visit. ? ?Nonallergic rhinitis ?Past history - Frequent sinus infections requiring antibiotics. Environmental allergy panel was negative.  ?Interim history - stable with saline nasal spray prn.  ?Use Flonase (fluticasone) nasal spray 1 spray per nostril twice a day as needed for nasal congestion.  ?Nasal saline spray (i.e., Simply Saline) or nasal saline lavage (i.e., NeilMed) is recommended as needed and prior to medicated nasal sprays. ? ?IgA deficiency (HCC)Rocaast history - Patient was diagnosed with IgA deficiency [facility] ago. Father had IgA deficiency as well. Patient has issues with frequent sinopulmonary infections requiring prolonged courses of antibiotics. 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 Normal IgG and IgM; low IgA, pneumococcal titers adequate, good response to Tdap. Up to date with vaccines.  ?Interim history - no infections/antibiotics use. Started on skyrizi for psoriasis.  ?Keep track of infections. ?Usually need at least 14-21 days worth of antibiotics with infections.  ?If you need a blood transfusion - make sure they know you have IgA deficiency. There is a small risk of allergic reaction. ?Get flu shot every fall.  ? ?Heartburn ?Controlled with diet. ?Continue lifestyle and dietary modifications. ? ?Psoriasis ?Continue to follow up with dermatology - [place]?Return in about 6 months (around 07/08/2022). ? ?No orders of the defined types were placed in this encounter. ? ?Lab Orders  ?No laboratory test(s) ordered  today  ? ? ?Diagnostics: ?Spirometry:  ?Tracings reviewed. Her effort: Good reproducible efforts. ?FVC: 1.92L ?FEV1: 1.45L, 81% predicted ?FEV1/FVC ratio: 76% ?Interpretation: Spirometry consistent with possible restrictive disease.  ?Please see  scanned spirometry results for details. ? ?Medication List:  ?Current Outpatient Medications  ?Medication Sig Dispense Refill  ? clobetasol ointment (TEMOVATE) 0.05 % Apply to the affected area 1-2 times a day (do not use on face, skin folds or large surface area) 60 g 0  ? cyanocobalamin (,VITAMIN B-12,) 1000 MCG/ML injection INJECT 1ML UNDER THE SKIN EVERY 30 DAYS 3 mL 1  ? estrogen-methylTESTOSTERone (EST ESTROGENS-METHYLTEST HS) 0.625-1.25 MG tablet TAKE 1 TABLET BY MOUTH 3 TIMES WEEKLY 36 tablet 1  ? fluticasone (FLOVENT HFA) 110 MCG/ACT inhaler Inhale 2 puffs into the lungs daily. Rinse mouth after each use. 12 g 3  ? levothyroxine (SYNTHROID) 75 MCG tablet TAKE 1 TABLET(75 MCG) BY MOUTH DAILY BEFORE BREAKFAST 90 tablet 1  ? Risankizumab-rzaa (SKYRIZI PEN) 150 MG/ML SOAJ Inject 150 mg into the skin as directed. At weeks 0 & 4. 1 mL 2  ? Risankizumab-rzaa (SKYRIZI PEN) 150 MG/ML SOAJ Inject 150 mg into the skin as directed. Every 12 weeks for maintenance. 1 mL 4  ? rizatriptan (MAXALT) 10 MG tablet Take 1 tablet (10 mg total) by mouth as needed for migraine. May repeat in 2 hours if needed 10 tablet 4  ? sertraline (ZOLOFT) 100 MG tablet TAKE 2 TABLETS(200 MG) BY MOUTH DAILY 180 tablet 0  ? sodium chloride (OCEAN) 0.65 % SOLN nasal spray Place 1 spray into both nostrils as needed for congestion. 15 mL 0  ? traZODone (DESYREL) 150 MG tablet TAKE 1 TABLET(150 MG) BY MOUTH AT BEDTIME 90 tablet 2  ? triamcinolone cream (KENALOG) 0.1 % Apply to affected area 2 times a day as needed 80 g 2  ? TUBERCULIN SYR 1CC/25GX5/8" (SAFETY-LOK TB SYR 1CC/25GX5/8") 25G X 5/8" 1 ML MISC USE AS DIRECTED FOR B-12 INJECTIONS. 20 each 3  ? valACYclovir (VALTREX) 1000 MG tablet TAKE 2 TABLETS BY MOUTH EVERY 12 HOURS FOR 1 DAY AS NEEDED 20 tablet 3  ? Vitamin D, Ergocalciferol, (DRISDOL) 1.25 MG (50000 UNIT) CAPS capsule TAKE 1 CAPSULE BY MOUTH EVERY OTHER WEEK 6 capsule 3  ? ?No current facility-administered medications for this visit.   ? ?Allergies: ?Allergies  ?Allergen Reactions  ? Gammagard S-D Less Iga [Immune Globulin (Human)] Anaphylaxis  ?  Have to have triple washed blood products due to this allergy pt states  ? Clarithromycin Other (See Comments)  ?  Severe leg and muscle cramps  ? Erythromycin Base Nausea And Vomiting  ? Codeine Rash  ?  Syrup only, can take hydrocodone, oxycodone  ? Levaquin [Levofloxacin In D5w] Other (See Comments)  ?  tendonitis  ? Other Rash and Other (See Comments)  ?  IGA Deficiency - catches a lot of viruses  ? Sulfonamide Derivatives Rash  ? Tetracycline Rash  ? ?I reviewed her past medical history, social history, family history, and environmental history and no significant changes have been reported from her previous visit. ? ?Review of Systems  ?Constitutional:  Negative for appetite change, chills, fever and unexpected weight change.  ?HENT:  Negative for congestion and rhinorrhea.   ?Eyes:  Negative for itching.  ?Respiratory:  Negative for cough, chest tightness, shortness of breath and wheezing.   ?Cardiovascular:  Negative for chest pain.  ?Gastrointestinal:  Negative for abdominal pain.  ?Genitourinary:  Negative for difficulty urinating.  ?Skin:  Positive for rash.  ?Neurological:  Negative for headaches.  ? ?Objective: ?BP 122/64   Pulse 68   Temp 97.8 ?F (36.6 ?C)   Resp 18   Ht 5' (1.524 m)   Wt 113 lb 2 oz (51.3 kg)   LMP 08/24/1997   SpO2 98%   BMI 22.09 kg/m?  ?Body mass index is 22.09 kg/m?Marland Kitchen ?Physical Exam ?Vitals and nursing note reviewed.  ?Constitutional:   ?   Appearance: Normal appearance. She is well-developed.  ?HENT:  ?   Head: Normocephalic and atraumatic.  ?   Right Ear: Tympanic membrane and external ear normal.  ?   Left Ear: Tympanic membrane and external ear normal.  ?   Nose: Nose normal.  ?   Mouth/Throat:  ?   Mouth: Mucous membranes are moist.  ?   Pharynx: Oropharynx is clear.  ?Eyes:  ?   Conjunctiva/sclera: Conjunctivae normal.  ?Cardiovascular:  ?   Rate and  Rhythm: Normal rate and regular rhythm.  ?   Heart sounds: Normal heart sounds. No murmur heard. ?  No friction rub. No gallop.  ?Pulmonary:  ?   Effort: Pulmonary effort is normal.  ?   Breath sounds: Normal breath soun

## 2022-01-05 ENCOUNTER — Encounter: Payer: Self-pay | Admitting: Allergy

## 2022-01-05 ENCOUNTER — Ambulatory Visit: Payer: Medicare PPO | Admitting: Allergy

## 2022-01-05 VITALS — BP 122/64 | HR 68 | Temp 97.8°F | Resp 18 | Ht 60.0 in | Wt 113.1 lb

## 2022-01-05 DIAGNOSIS — D802 Selective deficiency of immunoglobulin A [IgA]: Secondary | ICD-10-CM

## 2022-01-05 DIAGNOSIS — J4530 Mild persistent asthma, uncomplicated: Secondary | ICD-10-CM | POA: Diagnosis not present

## 2022-01-05 DIAGNOSIS — R12 Heartburn: Secondary | ICD-10-CM

## 2022-01-05 DIAGNOSIS — L409 Psoriasis, unspecified: Secondary | ICD-10-CM

## 2022-01-05 DIAGNOSIS — J310 Chronic rhinitis: Secondary | ICD-10-CM | POA: Diagnosis not present

## 2022-01-05 NOTE — Assessment & Plan Note (Signed)
Controlled with diet. ?? Continue lifestyle and dietary modifications. ?

## 2022-01-05 NOTE — Assessment & Plan Note (Signed)
Past history - Respiratory symptoms with URIs mainly for the past 10 years. Uses albuterol in these situations with good benefit. 2021 spirometry was normal. ?Interim history - uses Flovent 127mg 1-2 puffs per day with good benefit.  ?? Today's spirometry showed some restriction.  ?? Daily controller medication(s): Flovent 1143m 1-2 puffs once a day with spacer and rinse mouth afterwards. ?? During upper respiratory infections: start Flovent 11071m2 puffs twice a day for 1-2 weeks until your breathing symptoms return to baseline.  ?? May use albuterol rescue inhaler 2 puffs every 4 to 6 hours as needed for shortness of breath, chest tightness, coughing, and wheezing. May use albuterol rescue inhaler 2 puffs 5 to 15 minutes prior to strenuous physical activities. Monitor frequency of use.  ?? Get spirometry at next visit. ?

## 2022-01-05 NOTE — Assessment & Plan Note (Signed)
Past history - Frequent sinus infections requiring antibiotics. Environmental allergy panel was negative.  ?Interim history - stable with saline nasal spray prn.  ?? Use Flonase (fluticasone) nasal spray 1 spray per nostril twice a day as needed for nasal congestion.  ?? Nasal saline spray (i.e., Simply Saline) or nasal saline lavage (i.e., NeilMed) is recommended as needed and prior to medicated nasal sprays. ?

## 2022-01-05 NOTE — Assessment & Plan Note (Signed)
Past history - Patient was diagnosed with IgA deficiency at [facility] over 10 years ago. Father had IgA deficiency as well. Patient has issues with frequent sinopulmonary infections requiring prolonged courses of antibiotics. 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 Normal IgG and IgM; low IgA, pneumococcal titers adequate, good response to Tdap. Up to date with vaccines.  ?Interim history - no infections/antibiotics use. Started on skyrizi for psoriasis.  ?? Keep track of infections. ?? Usually need at least 14-21 days worth of antibiotics with infections.  ?? 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 There is a small risk of allergic reaction. ?? Get flu shot every fall.  ?

## 2022-01-05 NOTE — Patient Instructions (Addendum)
Breathing: ?Daily controller medication(s): Flovent 178mg 1-2 puffs once a day with spacer and rinse mouth afterwards. ?During upper respiratory infections: start Flovent 1176m 2 puffs twice a day for 1-2 weeks until your breathing symptoms return to baseline.  ?May use albuterol rescue inhaler 2 puffs every 4 to 6 hours as needed for shortness of breath, chest tightness, coughing, and wheezing. May use albuterol rescue inhaler 2 puffs 5 to 15 minutes prior to strenuous physical activities. Monitor frequency of use.  ?Breathing control goals:  ?Full participation in all desired activities (may need albuterol before activity) ?Albuterol use two times or less a week on average (not counting use with activity) ?Cough interfering with sleep two times or less a month ?Oral steroids no more than once a year ?No hospitalizations ? ?IgA deficiency:  ?Keep track of infections. ?Usually need at least 14-21 days worth of antibiotics with infections.  ?If you need a blood transfusion - make sure they know you have IgA deficiency. There is a small risk of allergic reaction ?Get flu shot every fall.  ? ?Chronic rhinitis ?Use Flonase (fluticasone) nasal spray 1 spray per nostril twice a day as needed for nasal congestion.  ?Nasal saline spray (i.e., Simply Saline) or nasal saline lavage (i.e., NeilMed) is recommended as needed and prior to medicated nasal sprays. ? ?Skin: ?Continue to follow up with dermatology regarding this. ? ?Heartburn: ?Continue lifestyle and dietary modifications. ? ?Follow up in 6 months or sooner if needed.  ? ?Skin care recommendations ? ?Bath time: ?Always use lukewarm water. AVOID very hot or cold water. ?Keep bathing time to 5-10 minutes. ?Do NOT use bubble bath. ?Use a mild soap and use just enough to wash the dirty areas. ?Do NOT scrub skin vigorously.  ?After bathing, pat dry your skin with a towel. Do NOT rub or scrub the skin. ? ?Moisturizers and prescriptions:  ?ALWAYS apply moisturizers  immediately after bathing (within 3 minutes). This helps to lock-in moisture. ?Use the moisturizer several times a day over the whole body. ?Good summer moisturizers include: Aveeno, CeraVe, Cetaphil. ?Good winter moisturizers include: Aquaphor, Vaseline, Cerave, Cetaphil, Eucerin, Vanicream. ?When using moisturizers along with medications, the moisturizer should be applied about one hour after applying the medication to prevent diluting effect of the medication or moisturize around where you applied the medications. When not using medications, the moisturizer can be continued twice daily as maintenance. ? ?Laundry and clothing: ?Avoid laundry products with added color or perfumes. ?Use unscented hypo-allergenic laundry products such as Tide free, Cheer free & gentle, and All free and clear.  ?If the skin still seems dry or sensitive, you can try double-rinsing the clothes. ?Avoid tight or scratchy clothing such as wool. ?Do not use fabric softeners or dyer sheets.  ?

## 2022-01-05 NOTE — Assessment & Plan Note (Signed)
?   Continue to follow up with dermatology - on [place]. ?

## 2022-01-07 DIAGNOSIS — I618 Other nontraumatic intracerebral hemorrhage: Secondary | ICD-10-CM | POA: Diagnosis not present

## 2022-01-07 DIAGNOSIS — Q046 Congenital cerebral cysts: Secondary | ICD-10-CM | POA: Diagnosis not present

## 2022-01-07 DIAGNOSIS — R4189 Other symptoms and signs involving cognitive functions and awareness: Secondary | ICD-10-CM | POA: Diagnosis not present

## 2022-01-12 ENCOUNTER — Other Ambulatory Visit (HOSPITAL_COMMUNITY): Payer: Self-pay

## 2022-01-12 ENCOUNTER — Other Ambulatory Visit: Payer: Self-pay | Admitting: Internal Medicine

## 2022-01-12 MED FILL — Cyanocobalamin Inj 1000 MCG/ML: INTRAMUSCULAR | 90 days supply | Qty: 3 | Fill #0 | Status: AC

## 2022-01-27 NOTE — Progress Notes (Signed)
Jo Gomez, Jo Gomez ([id number]) Visit Report for 01/28/2022 Allergy List Details Patient Name: Date of Service: Jo Gomez, Jo Gomez 01/28/2022 8:30 A M Medical Record Number: [id number] Patient Account Number: [id number] Date of Birth/Sex: Treating RN: [birth year] (76 y.o. Sue Lush Primary Care Bertine Schlottman: Other Clinician: Referring Josue Kass: Treating Hala Narula/Extender: Cheree Ditto in Treatment: 0 Allergies Active Allergies Gammagard S-D (IgA < 1 mcg/mL) clarithromycin erythromycin base codeine Levaquin Sulfa (Sulfonamide Antibiotics) tetracycline Allergy Notes Electronic Signature(s) Signed: 01/27/2022 5:56:07 PM By: Lorrin [name] Entered By: Lorrin [name] on 01/27/2022 17:56:07

## 2022-01-28 ENCOUNTER — Encounter (HOSPITAL_BASED_OUTPATIENT_CLINIC_OR_DEPARTMENT_OTHER): Payer: Medicare PPO | Admitting: Internal Medicine

## 2022-02-15 ENCOUNTER — Encounter: Payer: Self-pay | Admitting: Internal Medicine

## 2022-02-16 ENCOUNTER — Ambulatory Visit (INDEPENDENT_AMBULATORY_CARE_PROVIDER_SITE_OTHER): Payer: Medicare PPO | Admitting: Internal Medicine

## 2022-02-16 ENCOUNTER — Other Ambulatory Visit (HOSPITAL_COMMUNITY): Payer: Self-pay

## 2022-02-16 ENCOUNTER — Encounter: Payer: Self-pay | Admitting: Internal Medicine

## 2022-02-16 VITALS — BP 108/56 | HR 71 | Temp 97.9°F | Ht 60.0 in | Wt 111.6 lb

## 2022-02-16 DIAGNOSIS — E538 Deficiency of other specified B group vitamins: Secondary | ICD-10-CM | POA: Diagnosis not present

## 2022-02-16 DIAGNOSIS — F3289 Other specified depressive episodes: Secondary | ICD-10-CM | POA: Diagnosis not present

## 2022-02-16 DIAGNOSIS — G4709 Other insomnia: Secondary | ICD-10-CM | POA: Diagnosis not present

## 2022-02-16 DIAGNOSIS — M8589 Other specified disorders of bone density and structure, multiple sites: Secondary | ICD-10-CM | POA: Diagnosis not present

## 2022-02-16 DIAGNOSIS — M47816 Spondylosis without myelopathy or radiculopathy, lumbar region: Secondary | ICD-10-CM | POA: Insufficient documentation

## 2022-02-16 DIAGNOSIS — G43909 Migraine, unspecified, not intractable, without status migrainosus: Secondary | ICD-10-CM | POA: Diagnosis not present

## 2022-02-16 DIAGNOSIS — R739 Hyperglycemia, unspecified: Secondary | ICD-10-CM

## 2022-02-16 DIAGNOSIS — E890 Postprocedural hypothyroidism: Secondary | ICD-10-CM | POA: Diagnosis not present

## 2022-02-16 DIAGNOSIS — Z0000 Encounter for general adult medical examination without abnormal findings: Secondary | ICD-10-CM

## 2022-02-16 MED ORDER — SERTRALINE HCL 100 MG PO TABS
ORAL_TABLET | ORAL | 3 refills | Status: DC
  Filled 2022-02-16: qty 180, 90d supply, fill #0
  Filled 2022-05-14: qty 180, 90d supply, fill #1
  Filled 2022-08-22: qty 180, 90d supply, fill #2

## 2022-02-16 MED ORDER — TRAZODONE HCL 150 MG PO TABS
ORAL_TABLET | ORAL | 3 refills | Status: DC
  Filled 2022-02-16: qty 90, 90d supply, fill #0
  Filled 2022-05-14: qty 90, 90d supply, fill #1
  Filled 2022-08-24: qty 90, 90d supply, fill #2

## 2022-02-16 MED ORDER — LEVOTHYROXINE SODIUM 75 MCG PO TABS
ORAL_TABLET | ORAL | 3 refills | Status: DC
  Filled 2022-02-16: qty 90, 90d supply, fill #0
  Filled 2022-03-10 – 2022-05-18 (×2): qty 90, 90d supply, fill #1
  Filled 2022-08-24: qty 90, 90d supply, fill #2
  Filled 2022-12-02: qty 90, 90d supply, fill #3

## 2022-02-16 NOTE — Assessment & Plan Note (Signed)
Chronic Occasional migraine only Continue Maxalt 10 mg as needed

## 2022-02-16 NOTE — Assessment & Plan Note (Signed)
Chronic  Clinically euthyroid Currently taking levothyroxine 75 mcg daily Check tsh  Titrate med dose if needed  

## 2022-02-16 NOTE — Assessment & Plan Note (Signed)
Chronic DEXA up-to-date Continue as much activity/exercise as possible Continue calcium and vitamin D daily

## 2022-02-17 ENCOUNTER — Other Ambulatory Visit (HOSPITAL_COMMUNITY): Payer: Self-pay

## 2022-02-18 ENCOUNTER — Other Ambulatory Visit (INDEPENDENT_AMBULATORY_CARE_PROVIDER_SITE_OTHER): Payer: Medicare PPO

## 2022-02-18 DIAGNOSIS — M8589 Other specified disorders of bone density and structure, multiple sites: Secondary | ICD-10-CM | POA: Diagnosis not present

## 2022-02-18 DIAGNOSIS — Z0000 Encounter for general adult medical examination without abnormal findings: Secondary | ICD-10-CM | POA: Diagnosis not present

## 2022-02-18 DIAGNOSIS — R739 Hyperglycemia, unspecified: Secondary | ICD-10-CM

## 2022-02-18 DIAGNOSIS — E890 Postprocedural hypothyroidism: Secondary | ICD-10-CM | POA: Diagnosis not present

## 2022-02-18 DIAGNOSIS — E538 Deficiency of other specified B group vitamins: Secondary | ICD-10-CM

## 2022-02-18 LAB — LIPID PANEL
Cholesterol: 173 mg/dL (ref 0–200)
HDL: 67.4 mg/dL (ref >39.00)
LDL Cholesterol: 93 mg/dL (ref 0–99)
NonHDL: 105.13
Total CHOL/HDL Ratio: 3
Triglycerides: 62 mg/dL (ref 0.0–149.0)
VLDL: 12.4 mg/dL (ref 0.0–40.0)

## 2022-02-18 LAB — CBC WITH DIFFERENTIAL/PLATELET
Basophils Absolute: 0 10*3/uL (ref 0.0–0.1)
Basophils Relative: 0.6 % (ref 0.0–3.0)
Eosinophils Absolute: 0.4 10*3/uL (ref 0.0–0.7)
Eosinophils Relative: 5.4 % — ABNORMAL HIGH (ref 0.0–5.0)
HCT: 39.1 % (ref 36.0–46.0)
Hemoglobin: 13.5 g/dL (ref 12.0–15.0)
Lymphocytes Relative: 20.7 % (ref 12.0–46.0)
Lymphs Abs: 1.3 10*3/uL (ref 0.7–4.0)
MCHC: 34.6 g/dL (ref 30.0–36.0)
MCV: 94 fl (ref 78.0–100.0)
Monocytes Absolute: 0.8 10*3/uL (ref 0.1–1.0)
Monocytes Relative: 11.8 % (ref 3.0–12.0)
Neutro Abs: 4 10*3/uL (ref 1.4–7.7)
Neutrophils Relative %: 61.5 % (ref 43.0–77.0)
Platelets: 253 10*3/uL (ref 150.0–400.0)
RBC: 4.16 Mil/uL (ref 3.87–5.11)
RDW: 13.2 % (ref 11.5–15.5)
WBC: 6.5 10*3/uL (ref 4.0–10.5)

## 2022-02-18 LAB — COMPREHENSIVE METABOLIC PANEL
ALT: 21 U/L (ref 0–35)
AST: 24 U/L (ref 0–37)
Albumin: 4 g/dL (ref 3.5–5.2)
Alkaline Phosphatase: 74 U/L (ref 39–117)
BUN: 12 mg/dL (ref 6–23)
CO2: 28 mEq/L (ref 19–32)
Calcium: 9.1 mg/dL (ref 8.4–10.5)
Chloride: 98 mEq/L (ref 96–112)
Creatinine, Ser: 0.76 mg/dL (ref 0.40–1.20)
GFR: 77.43 mL/min (ref >60.00)
Glucose, Bld: 89 mg/dL (ref 70–99)
Potassium: 4.3 mEq/L (ref 3.5–5.1)
Sodium: 132 mEq/L — ABNORMAL LOW (ref 135–145)
Total Bilirubin: 0.5 mg/dL (ref 0.2–1.2)
Total Protein: 6.6 g/dL (ref 6.0–8.3)

## 2022-02-18 LAB — VITAMIN B12: Vitamin B-12: 435 pg/mL (ref 211–911)

## 2022-02-18 LAB — HEMOGLOBIN A1C: Hgb A1c MFr Bld: 5.3 % (ref 4.6–6.5)

## 2022-02-18 LAB — VITAMIN D 25 HYDROXY (VIT D DEFICIENCY, FRACTURES): VITD: 67.38 ng/mL (ref 30.00–100.00)

## 2022-02-18 LAB — TSH: TSH: 1.68 u[IU]/mL (ref 0.35–5.50)

## 2022-03-02 ENCOUNTER — Encounter: Payer: Self-pay | Admitting: Internal Medicine

## 2022-03-06 ENCOUNTER — Encounter (HOSPITAL_BASED_OUTPATIENT_CLINIC_OR_DEPARTMENT_OTHER): Payer: Self-pay | Admitting: Obstetrics & Gynecology

## 2022-03-09 ENCOUNTER — Other Ambulatory Visit (HOSPITAL_BASED_OUTPATIENT_CLINIC_OR_DEPARTMENT_OTHER): Payer: Self-pay | Admitting: Obstetrics & Gynecology

## 2022-03-09 ENCOUNTER — Other Ambulatory Visit (HOSPITAL_COMMUNITY): Payer: Self-pay

## 2022-03-09 DIAGNOSIS — Z9229 Personal history of other drug therapy: Secondary | ICD-10-CM

## 2022-03-09 MED ORDER — EST ESTROGENS-METHYLTEST HS 0.625-1.25 MG PO TABS
1.0000 | ORAL_TABLET | ORAL | 1 refills | Status: DC
  Filled 2022-03-09 – 2022-06-12 (×2): qty 36, 84d supply, fill #0

## 2022-03-10 ENCOUNTER — Other Ambulatory Visit (HOSPITAL_COMMUNITY): Payer: Self-pay

## 2022-03-11 ENCOUNTER — Other Ambulatory Visit: Payer: Self-pay | Admitting: Internal Medicine

## 2022-03-11 DIAGNOSIS — Z1231 Encounter for screening mammogram for malignant neoplasm of breast: Secondary | ICD-10-CM

## 2022-03-12 ENCOUNTER — Other Ambulatory Visit (HOSPITAL_COMMUNITY): Payer: Self-pay

## 2022-03-13 ENCOUNTER — Other Ambulatory Visit (HOSPITAL_COMMUNITY): Payer: Self-pay

## 2022-03-25 ENCOUNTER — Encounter: Payer: Self-pay | Admitting: Internal Medicine

## 2022-03-26 ENCOUNTER — Encounter: Payer: Self-pay | Admitting: Family Medicine

## 2022-03-26 ENCOUNTER — Other Ambulatory Visit (HOSPITAL_COMMUNITY): Payer: Self-pay

## 2022-03-26 ENCOUNTER — Ambulatory Visit: Payer: Medicare PPO | Admitting: Family Medicine

## 2022-03-26 VITALS — BP 134/78 | HR 64 | Temp 97.8°F | Ht 60.0 in | Wt 110.0 lb

## 2022-03-26 DIAGNOSIS — H6992 Unspecified Eustachian tube disorder, left ear: Secondary | ICD-10-CM | POA: Insufficient documentation

## 2022-03-26 DIAGNOSIS — J019 Acute sinusitis, unspecified: Secondary | ICD-10-CM | POA: Insufficient documentation

## 2022-03-26 DIAGNOSIS — H6982 Other specified disorders of Eustachian tube, left ear: Secondary | ICD-10-CM

## 2022-03-26 MED ORDER — AMOXICILLIN-POT CLAVULANATE 875-125 MG PO TABS
1.0000 | ORAL_TABLET | Freq: Two times a day (BID) | ORAL | 0 refills | Status: DC
  Filled 2022-03-26: qty 20, 10d supply, fill #0

## 2022-03-26 MED ORDER — PREDNISONE 10 MG (21) PO TBPK
ORAL_TABLET | Freq: Every day | ORAL | 0 refills | Status: DC
  Filled 2022-03-26: qty 21, 6d supply, fill #0

## 2022-03-26 NOTE — Patient Instructions (Signed)
Augmentin and prednisone tapering Dosepak have been prescribed.  Stay well-hydrated.  Continue using Flonase and consider taking a nondrowsy antihistamine such as Claritin, Zyrtec or Xyzal.  Follow-up if you are getting worse or if you are not back to baseline when you complete the antibiotics.

## 2022-03-26 NOTE — Progress Notes (Signed)
Subjective:     Patient ID: Jo Gomez, female    DOB: [birth year], 76 y.o.   MRN: [id number]  Chief Complaint  Patient presents with   Ear Fullness    Started a month ago with sinus issues and then got a little better with antibiotics but now they have started to resurface. She is having headaches along with left ear fullness. States she couldn't hear for 2 days but woke up this morning and she can hear again but would like to have it checked as she thinks sinus infection is coming back    HPI Patient is in today for complaints of a left sided frontal headache, left frontal and maxillary sinus pressure, post nasal drainage and left ear feeling clogged.  Coughing up green mucus occasionally.    She took a course of Amoxicillin x 10 days. States typically sinusitis requires longer antibiotic treatment and oral steroids.   Denies fever, chills, dizziness, chest pain, palpitations, shortness of breath, abdominal pain, N/V/D.     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 Vertell Limber   THYROIDECTOMY  2000   TOTAL ABDOMINAL HYSTERECTOMY W/ BILATERAL SALPINGOOPHORECTOMY  1999   UPPER GASTROINTESTINAL ENDOSCOPY      Family History  Problem Relation Age of Onset   Heart attack Mother    Osteoporosis Mother    Lung cancer Mother    Colon polyps  Mother    Arthritis Mother    COPD Father        chronic lung infections   Colon polyps Father    Colon cancer Neg Hx    Esophageal cancer Neg Hx    Stomach cancer Neg Hx    Rectal cancer Neg Hx    Breast cancer Neg Hx     Social History   Socioeconomic History   Marital status: Married    Spouse name: [name]   Number of children: 1   Years of education: Not on file   Highest education level: Master's degree (e.g., MA, MS, MEng, MEd, MSW, MBA)  Occupational History    Employer: RETIRED  Tobacco Use   Smoking status: Former    Packs/day: 0.50    Years: 3.00    Total pack years: 1.50    Types: Cigarettes    Quit date: 08/24/1968    Years since quitting: 53.6   Smokeless tobacco: Never   Tobacco comments:    in college  Vaping Use   Vaping Use: Never used  Substance and Sexual Activity   Alcohol use: Yes    Alcohol/week: 2.0 standard drinks of alcohol  Types: 2 Standard drinks or equivalent per week   Drug use: No   Sexual activity: Yes    Partners: Female    Birth control/protection: Surgical    Comment: TAH/BSO  Other Topics Concern   Not on file  Social History Narrative   Patient is right-handed. She lives with her wife in a split level house. She occasionally drinks coffee.   Social Determinants of Health   Financial Resource Strain: Low Risk  (04/17/2021)   Overall Financial Resource Strain (CARDIA)    Difficulty of Paying Living Expenses: Not hard at all  Food Insecurity: No Food Insecurity (04/17/2021)   Hunger Vital Sign    Worried About Running Out of Food in the Last Year: Never true    [place] in the Last Year: Never true  Transportation Needs: No Transportation Needs (04/17/2021)   PRAPARE - Hydrologist (Medical): No    Lack of Transportation (Non-Medical): No  Physical Activity: Sufficiently Active (04/17/2021)   Exercise Vital Sign    Days of Exercise per Week: 5 days    Minutes of Exercise per Session: 30  min  Stress: No Stress Concern Present (04/17/2021)   [place]    Feeling of Stress : Not at all  Social Connections: Moderately Integrated (04/17/2021)   Social Connection and Isolation Panel [NHANES]    Frequency of Communication with Friends and Family: More than three times a week    Frequency of Social Gatherings with Friends and Family: Three times a week    Attends Religious Services: Never    Active Member of Clubs or Organizations: Yes    Attends Archivist Meetings: Never    Marital Status: Married  Human resources officer Violence: Not At Risk (04/17/2021)   Humiliation, Afraid, Rape, and Kick questionnaire    Fear of Current or Ex-Partner: No    Emotionally Abused: No    Physically Abused: No    Sexually Abused: No    Outpatient Medications Prior to Visit  Medication Sig Dispense Refill   cyanocobalamin (,VITAMIN B-12,) 1000 MCG/ML injection INJECT 1ML UNDER THE SKIN EVERY 30 DAYS 3 mL 1   estrogen-methylTESTOSTERone (EST ESTROGENS-METHYLTEST HS) 0.625-1.25 MG tablet Take 1 tablet by mouth 3 (three) times a week. 36 tablet 1   fluticasone (FLOVENT HFA) 110 MCG/ACT inhaler Inhale 2 puffs into the lungs daily. Rinse mouth after each use. 12 g 3   levothyroxine (SYNTHROID) 75 MCG tablet TAKE 1 TABLET (75 MCG) BY MOUTH DAILY BEFORE BREAKFAST 90 tablet 3   Risankizumab-rzaa (SKYRIZI PEN) 150 MG/ML SOAJ Inject 150 mg into the skin as directed. At weeks 0 & 4. 1 mL 2   rizatriptan (MAXALT) 10 MG tablet Take 1 tablet (10 mg total) by mouth as needed for migraine. May repeat in 2 hours if needed 10 tablet 4   sertraline (ZOLOFT) 100 MG tablet TAKE 2 TABLETS BY MOUTH DAILY 180 tablet 3   sodium chloride (OCEAN) 0.65 % SOLN nasal spray Place 1 spray into both nostrils as needed for congestion. 15 mL 0   traZODone (DESYREL) 150 MG tablet TAKE 1 TABLET BY MOUTH AT BEDTIME 90 tablet 3   valACYclovir (VALTREX) 1000 MG  tablet TAKE 2 TABLETS BY MOUTH EVERY 12 HOURS FOR 1 DAY AS NEEDED 20 tablet 3   Vitamin D, Ergocalciferol, (DRISDOL) 1.25 MG (50000 UNIT) CAPS capsule TAKE 1 CAPSULE BY MOUTH EVERY OTHER WEEK 6 capsule  3   triamcinolone cream (KENALOG) 0.1 % Apply to affected area 2 times a day as needed (Patient not taking: Reported on 02/16/2022) 80 g 2   No facility-administered medications prior to visit.    Allergies  Allergen Reactions   Gammagard S-D Less Iga [Immune Globulin (Human)] Anaphylaxis    Have to have triple washed blood products due to this allergy pt states   Clarithromycin Other (See Comments)    Severe leg and muscle cramps   Erythromycin Base Nausea And Vomiting   Codeine Rash    Syrup only, can take hydrocodone, oxycodone   Levaquin [Levofloxacin In D5w] Other (See Comments)    tendonitis   Other Rash and Other (See Comments)    IGA Deficiency - catches a lot of viruses   Sulfonamide Derivatives Rash   Tetracycline Rash    ROS     Objective:    Physical Exam Constitutional:      General: She is not in acute distress.    Appearance: She is not ill-appearing.  HENT:     Right Ear: Tympanic membrane and ear canal normal.     Left Ear: Ear canal normal. No mastoid tenderness. Tympanic membrane is retracted.     Nose: Congestion present.     Right Sinus: No maxillary sinus tenderness or frontal sinus tenderness.     Left Sinus: No maxillary sinus tenderness or frontal sinus tenderness.     Mouth/Throat:     Mouth: Mucous membranes are moist.     Pharynx: Oropharynx is clear.  Eyes:     Conjunctiva/sclera: Conjunctivae normal.     Pupils: Pupils are equal, round, and reactive to light.  Cardiovascular:     Rate and Rhythm: Normal rate and regular rhythm.  Pulmonary:     Effort: Pulmonary effort is normal.     Breath sounds: Normal breath sounds.  Musculoskeletal:     Cervical back: Normal range of motion and neck supple. No tenderness.  Lymphadenopathy:      Cervical: No cervical adenopathy.  Neurological:     Mental Status: She is alert.     BP 134/78 (BP Location: Left Arm, Patient Position: Sitting, Cuff Size: Large)   Pulse 64   Temp 97.8 F (36.6 C) (Temporal)   Ht 5' (1.524 m)   Wt 110 lb (49.9 kg)   LMP 08/24/1997   SpO2 99%   BMI 21.48 kg/m  Wt Readings from Last 3 Encounters:  03/26/22 110 lb (49.9 kg)  02/16/22 111 lb 9.6 oz (50.6 kg)  01/05/22 113 lb 2 oz (51.3 kg)       Assessment & Plan:   Problem List Items Addressed This Visit       Respiratory   Acute sinusitis with symptoms > 10 days - Primary    Recent course of Amoxicillin taken. Augmentin and prednisone dose pak prescribed. Discussed symptomatic treatment. Follow up if worsening or not back to baseline after completing the antibiotic.       Relevant Medications   amoxicillin-clavulanate (AUGMENTIN) 875-125 MG tablet   predniSONE (STERAPRED UNI-PAK 21 TAB) 10 MG (21) TBPK tablet     Nervous and Auditory   Eustachian tube dysfunction, left   Relevant Medications   amoxicillin-clavulanate (AUGMENTIN) 875-125 MG tablet   predniSONE (STERAPRED UNI-PAK 21 TAB) 10 MG (21) TBPK tablet    I am having Jo Gomez start on amoxicillin-clavulanate and predniSONE. I am also having her maintain her sodium chloride, valACYclovir, fluticasone, Vitamin D (Ergocalciferol), cyanocobalamin,  rizatriptan, Skyrizi Pen, triamcinolone cream, levothyroxine, traZODone, sertraline, and Est Estrogens-Methyltest HS.  Meds ordered this encounter  Medications   amoxicillin-clavulanate (AUGMENTIN) 875-125 MG tablet    Sig: Take 1 tablet by mouth 2 (two) times daily.    Dispense:  20 tablet    Refill:  0    Order Specific Question:   Supervising Provider    Answer:   Pricilla Holm A [[id number]]   predniSONE (STERAPRED UNI-PAK 21 TAB) 10 MG (21) TBPK tablet    Sig: Take by mouth daily as directed.    Dispense:  21 tablet    Refill:  0    Order Specific Question:    Supervising Provider    Answer:   Pricilla Holm A [[id number]]

## 2022-03-26 NOTE — Assessment & Plan Note (Signed)
Recent course of Amoxicillin taken. Augmentin and prednisone dose pak prescribed. Discussed symptomatic treatment. Follow up if worsening or not back to baseline after completing the antibiotic.

## 2022-03-27 ENCOUNTER — Other Ambulatory Visit (HOSPITAL_COMMUNITY): Payer: Self-pay

## 2022-03-27 ENCOUNTER — Ambulatory Visit: Payer: Medicare PPO | Admitting: Internal Medicine

## 2022-04-02 ENCOUNTER — Other Ambulatory Visit: Payer: Self-pay | Admitting: Family Medicine

## 2022-04-02 ENCOUNTER — Encounter: Payer: Self-pay | Admitting: Family Medicine

## 2022-04-02 DIAGNOSIS — H6982 Other specified disorders of Eustachian tube, left ear: Secondary | ICD-10-CM

## 2022-04-02 DIAGNOSIS — J019 Acute sinusitis, unspecified: Secondary | ICD-10-CM

## 2022-04-02 MED ORDER — AMOXICILLIN-POT CLAVULANATE 875-125 MG PO TABS: 1.0000 | ORAL_TABLET | Freq: Two times a day (BID) | ORAL | 0 refills | Status: DC

## 2022-04-07 ENCOUNTER — Other Ambulatory Visit (HOSPITAL_COMMUNITY): Payer: Self-pay

## 2022-04-07 MED FILL — Cyanocobalamin Inj 1000 MCG/ML: INTRAMUSCULAR | 90 days supply | Qty: 3 | Fill #1 | Status: AC

## 2022-04-17 ENCOUNTER — Ambulatory Visit (INDEPENDENT_AMBULATORY_CARE_PROVIDER_SITE_OTHER): Payer: Medicare PPO

## 2022-04-17 VITALS — BP 110/60 | HR 66 | Temp 98.1°F | Ht 60.0 in | Wt 108.8 lb

## 2022-04-17 DIAGNOSIS — Z0000 Encounter for general adult medical examination without abnormal findings: Secondary | ICD-10-CM | POA: Diagnosis not present

## 2022-04-17 NOTE — Patient Instructions (Signed)
[name] , Thank you for taking time to come for your Medicare Wellness Visit. I appreciate your ongoing commitment to your health goals. Please review the following plan we discussed and let me know if I can assist you in the future.   Screening recommendations/referrals: Colonoscopy: 09/03/2021; due every 5 years (due 09/03/2026) Mammogram: 06/13/2021; due every year (scheduled 06/18/2022) Bone Density: 09/27/2020; due every 2 years (due 09/27/2022) Recommended yearly ophthalmology/optometry visit for glaucoma screening and checkup Recommended yearly dental visit for hygiene and checkup  Vaccinations: Influenza vaccine: 05/20/2021; due Fall Season 2023 Pneumococcal vaccine: 06/25/2014, 02/01/2019 Tdap vaccine: 08/05/2020; due every 10 years (due 08/05/2030) Shingles vaccine: due; recommend Shingrix which is 2 doses 2-6 months apart and over 90% effective    Covid-19: 09/26/2019, 10/27/2019, 04/30/2020, 12/08/2020, 05/12/2021  Advanced directives: Yes; Please bring a copy of your health care power of attorney and living will to the office at your convenience.  Conditions/risks identified: Yes  Next appointment: Please schedule your next Medicare Wellness Visit with your Nurse Health Advisor in 1 year by calling [phone].   Preventive Care 30 Years and Older, Female Preventive care refers to lifestyle choices and visits with your health care provider that can promote health and wellness. What does preventive care include? A yearly physical exam. This is also called an annual well check. Dental exams once or twice a year. Routine eye exams. Ask your health care provider how often you should have your eyes checked. Personal lifestyle choices, including: Daily care of your teeth and gums. Regular physical activity. Eating a healthy diet. Avoiding tobacco and drug use. Limiting alcohol use. Practicing safe sex. Taking low-dose aspirin every day. Taking vitamin and mineral supplements as  recommended by your health care provider. What happens during an annual well check? The services and screenings done by your health care provider during your annual well check will depend on your age, overall health, lifestyle risk factors, and family history of disease. Counseling  Your health care provider may ask you questions about your: Alcohol use. Tobacco use. Drug use. Emotional well-being. Home and relationship well-being. Sexual activity. Eating habits. History of falls. Memory and ability to understand (cognition). Work and work Statistician. Reproductive health. Screening  You may have the following tests or measurements: Height, weight, and BMI. Blood pressure. Lipid and cholesterol levels. These may be checked every 5 years, or more frequently if you are over 75 years old. Skin check. Lung cancer screening. You may have this screening every year starting at age 47 if you have a 30-pack-year history of smoking and currently smoke or have quit within the past 15 years. Fecal occult blood test (FOBT) of the stool. You may have this test every year starting at age 64. Flexible sigmoidoscopy or colonoscopy. You may have a sigmoidoscopy every 5 years or a colonoscopy every 10 years starting at age 1. Hepatitis C blood test. Hepatitis B blood test. Sexually transmitted disease (STD) testing. Diabetes screening. This is done by checking your blood sugar (glucose) after you have not eaten for a while (fasting). You may have this done every 1-3 years. Bone density scan. This is done to screen for osteoporosis. You may have this done starting at age 34. Mammogram. This may be done every 1-2 years. Talk to your health care provider about how often you should have regular mammograms. Talk with your health care provider about your test results, treatment options, and if necessary, the need for more tests. Vaccines  Your health care provider  may recommend certain vaccines, such  as: Influenza vaccine. This is recommended every year. Tetanus, diphtheria, and acellular pertussis (Tdap, Td) vaccine. You may need a Td booster every 10 years. Zoster vaccine. You may need this after age 69. Pneumococcal 13-valent conjugate (PCV13) vaccine. One dose is recommended after age 62. Pneumococcal polysaccharide (PPSV23) vaccine. One dose is recommended after age 45. Talk to your health care provider about which screenings and vaccines you need and how often you need them. This information is not intended to replace advice given to you by your health care provider. Make sure you discuss any questions you have with your health care provider. Document Released: 09/06/2015 Document Revised: 04/29/2016 Document Reviewed: 06/11/2015 Elsevier Interactive Patient Education  2017 [place] Prevention in the Home Falls can cause injuries. They can happen to people of all ages. There are many things you can do to make your home safe and to help prevent falls. What can I do on the outside of my home? Regularly fix the edges of walkways and driveways and fix any cracks. Remove anything that might make you trip as you walk through a door, such as a raised step or threshold. Trim any bushes or trees on the path to your home. Use bright outdoor lighting. Clear any walking paths of anything that might make someone trip, such as rocks or tools. Regularly check to see if handrails are loose or broken. Make sure that both sides of any steps have handrails. Any raised decks and porches should have guardrails on the edges. Have any leaves, snow, or ice cleared regularly. Use sand or salt on walking paths during winter. Clean up any spills in your garage right away. This includes oil or grease spills. What can I do in the bathroom? Use night lights. Install grab bars by the toilet and in the tub and shower. Do not use towel bars as grab bars. Use non-skid mats or decals in the tub or  shower. If you need to sit down in the shower, use a plastic, non-slip stool. Keep the floor dry. Clean up any water that spills on the floor as soon as it happens. Remove soap buildup in the tub or shower regularly. Attach bath mats securely with double-sided non-slip rug tape. Do not have throw rugs and other things on the floor that can make you trip. What can I do in the bedroom? Use night lights. Make sure that you have a light by your bed that is easy to reach. Do not use any sheets or blankets that are too big for your bed. They should not hang down onto the floor. Have a firm chair that has side arms. You can use this for support while you get dressed. Do not have throw rugs and other things on the floor that can make you trip. What can I do in the kitchen? Clean up any spills right away. Avoid walking on wet floors. Keep items that you use a lot in easy-to-reach places. If you need to reach something above you, use a strong step stool that has a grab bar. Keep electrical cords out of the way. Do not use floor polish or wax that makes floors slippery. If you must use wax, use non-skid floor wax. Do not have throw rugs and other things on the floor that can make you trip. What can I do with my stairs? Do not leave any items on the stairs. Make sure that there are handrails on both sides  of the stairs and use them. Fix handrails that are broken or loose. Make sure that handrails are as long as the stairways. Check any carpeting to make sure that it is firmly attached to the stairs. Fix any carpet that is loose or worn. Avoid having throw rugs at the top or bottom of the stairs. If you do have throw rugs, attach them to the floor with carpet tape. Make sure that you have a light switch at the top of the stairs and the bottom of the stairs. If you do not have them, ask someone to add them for you. What else can I do to help prevent falls? Wear shoes that: Do not have high heels. Have  rubber bottoms. Are comfortable and fit you well. Are closed at the toe. Do not wear sandals. If you use a stepladder: Make sure that it is fully opened. Do not climb a closed stepladder. Make sure that both sides of the stepladder are locked into place. Ask someone to hold it for you, if possible. Clearly mark and make sure that you can see: Any grab bars or handrails. First and last steps. Where the edge of each step is. Use tools that help you move around (mobility aids) if they are needed. These include: Canes. Walkers. Scooters. Crutches. Turn on the lights when you go into a dark area. Replace any light bulbs as soon as they burn out. Set up your furniture so you have a clear path. Avoid moving your furniture around. If any of your floors are uneven, fix them. If there are any pets around you, be aware of where they are. Review your medicines with your doctor. Some medicines can make you feel dizzy. This can increase your chance of falling. Ask your doctor what other things that you can do to help prevent falls. This information is not intended to replace advice given to you by your health care provider. Make sure you discuss any questions you have with your health care provider. Document Released: 06/06/2009 Document Revised: 01/16/2016 Document Reviewed: 09/14/2014 Elsevier Interactive Patient Education  2017 [employer].

## 2022-04-17 NOTE — Progress Notes (Signed)
Subjective:   Jo Gomez is a 76 y.o. female who presents for Medicare Annual (Subsequent) preventive examination.  Review of Systems     Cardiac Risk Factors include: advanced age (>55mn, >>41women);family history of premature cardiovascular disease     Objective:    Today's Vitals   04/17/22 1115  BP: 110/60  Pulse: 66  Temp: 98.1 F (36.7 C)  SpO2: 97%  Weight: 108 lb 12.8 oz (49.4 kg)  Height: 5' (1.524 m)  PainSc: 0-No pain   Body mass index is 21.25 kg/m.     04/17/2022   11:44 AM 04/17/2021    9:03 AM 02/06/2020    9:55 AM 05/13/2016    1:50 PM 11/01/2014    3:48 PM  Advanced Directives  Does Patient Have a Medical Advance Directive? Yes Yes Yes Yes No  Type of [place] will;Healthcare Power of Attorney Living will Living will   Does patient want to make changes to medical advance directive? No - Patient declined No - Patient declined No - Patient declined    Copy of HDonnellyin Chart? No - copy requested No - copy requested       Current Medications (verified) Outpatient Encounter Medications as of 04/17/2022  Medication Sig   triamcinolone cream (KENALOG) 0.1 % Apply to affected area 2 times a day as needed   valACYclovir (VALTREX) 1000 MG tablet TAKE 2 TABLETS BY MOUTH EVERY 12 HOURS FOR 1 DAY AS NEEDED   amoxicillin-clavulanate (AUGMENTIN) 875-125 MG tablet Take 1 tablet by mouth 2 (two) times daily.   cyanocobalamin (VITAMIN B12) 1000 MCG/ML injection INJECT 1ML UNDER THE SKIN EVERY 30 DAYS   estrogen-methylTESTOSTERone (EST ESTROGENS-METHYLTEST HS) 0.625-1.25 MG tablet Take 1 tablet by mouth 3 (three) times a week.   fluticasone (FLOVENT HFA) 110 MCG/ACT inhaler Inhale 2 puffs into the lungs daily. Rinse mouth after each use.   levothyroxine (SYNTHROID) 75 MCG tablet TAKE 1 TABLET (75 MCG) BY MOUTH DAILY BEFORE BREAKFAST   predniSONE (STERAPRED UNI-PAK 21 TAB) 10 MG (21)  TBPK tablet Take by mouth daily as directed.   Risankizumab-rzaa (SKYRIZI PEN) 150 MG/ML SOAJ Inject 150 mg into the skin as directed. At weeks 0 & 4.   rizatriptan (MAXALT) 10 MG tablet Take 1 tablet (10 mg total) by mouth as needed for migraine. May repeat in 2 hours if needed   sertraline (ZOLOFT) 100 MG tablet TAKE 2 TABLETS BY MOUTH DAILY   sodium chloride (OCEAN) 0.65 % SOLN nasal spray Place 1 spray into both nostrils as needed for congestion.   traZODone (DESYREL) 150 MG tablet TAKE 1 TABLET BY MOUTH AT BEDTIME   Vitamin D, Ergocalciferol, (DRISDOL) 1.25 MG (50000 UNIT) CAPS capsule TAKE 1 CAPSULE BY MOUTH EVERY OTHER WEEK   No facility-administered encounter medications on file as of 04/17/2022.    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 Vertell Limber   THYROIDECTOMY  2000   TOTAL ABDOMINAL HYSTERECTOMY W/ BILATERAL SALPINGOOPHORECTOMY  1999   UPPER GASTROINTESTINAL ENDOSCOPY     Family History  Problem Relation Age of Onset   Heart attack Mother    Osteoporosis Mother    Lung cancer Mother    Colon polyps Mother    Arthritis Mother    COPD Father        chronic lung infections   Colon polyps Father    Colon cancer Neg Hx    Esophageal cancer Neg Hx    Stomach  cancer Neg Hx    Rectal cancer Neg Hx    Breast cancer Neg Hx    Social History   Socioeconomic History   Marital status: Married    Spouse name: [name]   Number of children: 1   Years of education: Not on file   Highest education level: Master's degree (e.g., MA, MS, MEng, MEd, MSW, MBA)  Occupational History    Employer: RETIRED  Tobacco Use   Smoking status: Former    Packs/day: 0.50    Years: 3.00    Total pack years: 1.50    Types: Cigarettes    Quit date: 08/24/1968    Years since quitting: 53.6   Smokeless tobacco: Never   Tobacco comments:    in college  Vaping Use   Vaping Use: Never used  Substance and Sexual Activity   Alcohol use: Yes    Alcohol/week: 2.0 standard drinks of alcohol    Types: 2 Standard drinks or equivalent per week   Drug use: No   Sexual activity: Yes    Partners: Female    Birth control/protection: Surgical    Comment: TAH/BSO  Other Topics Concern   Not on file  Social History Narrative   Patient is right-handed. She lives with her wife in a split level house. She occasionally drinks coffee.   Social Determinants of Health   Financial Resource Strain: Low Risk  (04/17/2022)   Overall Financial Resource Strain (CARDIA)    Difficulty of Paying Living Expenses: Not hard at all  Food Insecurity: No Food Insecurity (04/17/2022)   Hunger Vital Sign    Worried About Running Out of Food in the Last Year: Never true    Ran Out of Food in the Last Year: Never true  Transportation Needs: No Transportation Needs (04/17/2022)   PRAPARE - Hydrologist (Medical): No    Lack of Transportation (Non-Medical): No  Physical Activity: Sufficiently Active (04/17/2022)   Exercise Vital Sign    Days of Exercise per Week: 5 days    Minutes of Exercise per Session: 30 min  Stress: No Stress Concern Present (04/17/2022)   [place]    Feeling of Stress : Not at all   Social Connections: Moderately Integrated (04/17/2022)   Social Connection and Isolation Panel [NHANES]    Frequency of Communication with Friends and Family: More than three times a week    Frequency of Social Gatherings with Friends and Family: Three times a week    Attends Religious Services: Never    Active Member of  Clubs or Organizations: Yes    Attends Archivist Meetings: Never    Marital Status: Married    Tobacco Counseling Counseling given: Not Answered Tobacco comments: in college   Clinical Intake:  Pre-visit preparation completed: Yes  Pain : No/denies pain Pain Score: 0-No pain     BMI - recorded: 21.25 Nutritional Status: BMI of 19-24  Normal Nutritional Risks: None Diabetes: No  How often do you need to have someone help you when you read instructions, pamphlets, or other written materials from your doctor or pharmacy?: 1 - Never What is the last grade level you completed in school?: Masters Degree in Education  Diabetic? no  Interpreter Needed?: No  Information entered by :: Lisette Abu, LPN.   Activities of Daily Living    04/17/2022   11:46 AM  In your present state of health, do you have any difficulty performing the following activities:  Hearing? 0  Vision? 0  Difficulty concentrating or making decisions? 0  Walking or climbing stairs? 0  Dressing or bathing? 0  Doing errands, shopping? 0  Preparing Food and eating ? N  Using the Toilet? N  In the past six months, have you accidently leaked urine? N  Do you have problems with loss of bowel control? N  Managing your Medications? N  Managing your Finances? N  Housekeeping or managing your Housekeeping? N    Patient Care Team: Binnie Rail, MD as PCP - General (Internal Medicine) [facility] Megan Salon, MD as Consulting Physician (Gynecology) Warren Danes, PA-C as Physician Assistant (Dermatology) Warden Fillers, MD as Consulting  Physician (Ophthalmology) Lavonna Monarch, MD as Consulting Physician (Dermatology)  Indicate any recent Medical Services you may have received from other than [facility] providers in the past year (date may be approximate).     Assessment:   This is a routine wellness examination for [place].  Hearing/Vision screen Hearing Screening - Comments:: Patient denied any hearing difficulty.   No hearing aids.  Vision Screening - Comments:: Patient does wear corrective lenses/contacts.  Eye exam done by: Warden Fillers, MD.   Dietary issues and exercise activities discussed: Current Exercise Habits: Home exercise routine, Type of exercise: walking, Time (Minutes): 30, Frequency (Times/Week): 5, Weekly Exercise (Minutes/Week): 150, Intensity: Moderate, Exercise limited by: None identified   Goals Addressed             This Visit's Progress    My goal is to get back nto chair yoga and stretching.        Depression Screen    04/17/2022   11:45 AM 03/26/2022    3:58 PM 02/16/2022   11:16 AM 10/06/2021    2:04 PM 06/25/2021    2:18 PM 04/17/2021    9:02 AM 02/13/2021   10:20 AM  PHQ 2/9 Scores  PHQ - 2 Score 0 0 0 0 0 2 0  PHQ- 9 Score  2 2        Fall Risk    03/26/2022    3:59 PM 02/16/2022   11:16 AM 10/06/2021    2:03 PM 04/17/2021    9:05 AM 02/13/2021   10:19 AM  Fall Risk   Falls in the past year? '1 1 1 '$ 0 1  Number falls in past yr: '1 1 1 '$ 0 0  Injury with Fall? 0 0 0 0 0  Risk for fall due to : No Fall Risks  History of fall(s) No Fall Risks   Follow up  Falls evaluation completed  Falls evaluation completed Falls evaluation completed     FALL RISK PREVENTION PERTAINING TO THE HOME:  Any stairs in or around the home? No  If so, are there any without handrails? No  Home free of loose throw rugs in walkways, pet beds, electrical cords, etc? Yes  Adequate lighting in your home to reduce risk of falls? Yes   ASSISTIVE DEVICES UTILIZED TO PREVENT FALLS:  Life alert? Yes  (Apple  Watch) Use of a cane, walker or w/c? No  Grab bars in the bathroom? Yes  Shower chair or bench in shower? Yes  Elevated toilet seat or a handicapped toilet? Yes   TIMED UP AND GO:  Was the test performed? Yes .  Length of time to ambulate 10 feet: 6 sec.   Gait steady and fast without use of assistive device  Cognitive Function:      04/06/2018   11:56 AM  Montreal Cognitive Assessment   Visuospatial/ Executive (0/5) 4  Naming (0/3) 3  Attention: Read list of digits (0/2) 2  Attention: Read list of letters (0/1) 1  Attention: Serial 7 subtraction starting at 100 (0/3) 1  Language: Repeat phrase (0/2) 2  Language : Fluency (0/1) 1  Abstraction (0/2) 2  Delayed Recall (0/5) 0  Orientation (0/6) 6  Total 22  Adjusted Score (based on education) 22      04/17/2022   11:46 AM 02/06/2020    9:57 AM  6CIT Screen  What Year? 0 points 0 points  What month? 0 points 0 points  What time? 0 points 0 points  Count back from 20 0 points 0 points  Months in reverse 0 points 0 points  Repeat phrase 0 points 0 points  Total Score 0 points 0 points    Immunizations Immunization History  Administered Date(s) Administered   Hepb-cpg 10/10/2021, 12/17/2021   Influenza Split 07/13/2011, 08/08/2012   Influenza Whole 04/21/2010   Influenza,inj,Quad PF,6+ Mos 05/26/2013, 06/25/2014   Influenza-Unspecified 06/25/2015, 05/20/2021   Moderna Sars-Covid-2 Vaccination 09/26/2019, 10/27/2019, 04/30/2020   PFIZER Comirnaty(Gray Top)Covid-19 Tri-Sucrose Vaccine 12/08/2020   Pfizer Covid-19 Vaccine Bivalent Booster 4yr & up 05/12/2021   Pneumococcal Conjugate-13 06/25/2014   Pneumococcal Polysaccharide-23 11/23/1978, 08/24/1996, 03/25/2007, 02/01/2019   Td 08/24/2002, 08/05/2020   Tdap 11/30/2013    TDAP status: Up to date  Flu Vaccine status: Up to date  Pneumococcal vaccine status: Up to date  Covid-19 vaccine status: Completed vaccines  Qualifies for Shingles Vaccine? Yes    Zostavax completed No   Shingrix Completed?: No.    Education has been provided regarding the importance of this vaccine. Patient has been advised to call insurance company to determine out of pocket expense if they have not yet received this vaccine. Advised may also receive vaccine at local pharmacy or Health Dept. Verbalized acceptance and understanding.  Screening Tests Health Maintenance  Topic Date Due   Zoster Vaccines- Shingrix (1 of 2) Never done   COVID-19 Vaccine (6 - Moderna risk series) 07/07/2021   INFLUENZA VACCINE  03/24/2022   DEXA SCAN  09/27/2022   MAMMOGRAM  06/14/2023   COLONOSCOPY (Pts 45-439yrInsurance coverage will need to be confirmed)  09/03/2026   TETANUS/TDAP  08/05/2030   Pneumonia Vaccine 6569Years old  Completed   Hepatitis C Screening  Completed   HPV VACCINES  Aged Out    Health Maintenance  Health Maintenance Due  Topic Date Due   Zoster Vaccines- Shingrix (1 of 2) Never done  COVID-19 Vaccine (6 - Moderna risk series) 07/07/2021   INFLUENZA VACCINE  03/24/2022    Colorectal cancer screening: Type of screening: Colonoscopy. Completed 09/03/2021. Repeat every 5 years  Mammogram status: Completed 06/13/2021. Repeat every year  Bone Density status: Completed 09/27/2020. Results reflect: Bone density results: OSTEOPENIA. Repeat every 2 years.  Lung Cancer Screening: (Low Dose CT Chest recommended if Age 84-80 years, 30 pack-year currently smoking OR have quit w/in 15years.) does not qualify.   Lung Cancer Screening Referral: no  Additional Screening:  Hepatitis C Screening: does qualify; Completed 09/16/2021  Vision Screening: Recommended annual ophthalmology exams for early detection of glaucoma and other disorders of the eye. Is the patient up to date with their annual eye exam?  Yes  Who is the provider or what is the name of the office in which the patient attends annual eye exams? Warden Fillers, MD. If pt is not established with a  provider, would they like to be referred to a provider to establish care? No .   Dental Screening: Recommended annual dental exams for proper oral hygiene  Community Resource Referral / Chronic Care Management: CRR required this visit?  No   CCM required this visit?  No      Plan:     I have personally reviewed and noted the following in the patient's chart:   Medical and social history Use of alcohol, tobacco or illicit drugs  Current medications and supplements including opioid prescriptions. Patient is not currently taking opioid prescriptions. Functional ability and status Nutritional status Physical activity Advanced directives List of other physicians Hospitalizations, surgeries, and ER visits in previous 12 months Vitals Screenings to include cognitive, depression, and falls Referrals and appointments  In addition, I have reviewed and discussed with patient certain preventive protocols, quality metrics, and best practice recommendations. A written personalized care plan for preventive services as well as general preventive health recommendations were provided to patient.     Sheral Flow, LPN   5/46/5035   Nurse Notes:  Normal cognitive status assessed by direct observation by this Nurse Health Advisor. No abnormalities found.

## 2022-04-20 ENCOUNTER — Ambulatory Visit: Payer: Medicare PPO

## 2022-04-21 ENCOUNTER — Other Ambulatory Visit (HOSPITAL_COMMUNITY): Payer: Self-pay

## 2022-04-21 DIAGNOSIS — L409 Psoriasis, unspecified: Secondary | ICD-10-CM | POA: Diagnosis not present

## 2022-04-21 MED ORDER — SKYRIZI PEN 150 MG/ML ~~LOC~~ SOAJ
SUBCUTANEOUS | 3 refills | Status: DC
  Filled 2022-04-24: qty 1, 84d supply, fill #0
  Filled 2022-05-05: qty 1, fill #0
  Filled 2022-05-08: qty 1, 84d supply, fill #0
  Filled 2022-08-24 – 2022-08-25 (×2): qty 1, fill #0

## 2022-04-24 ENCOUNTER — Other Ambulatory Visit (HOSPITAL_COMMUNITY): Payer: Self-pay

## 2022-04-28 ENCOUNTER — Other Ambulatory Visit (HOSPITAL_COMMUNITY): Payer: Self-pay

## 2022-05-06 ENCOUNTER — Other Ambulatory Visit (HOSPITAL_COMMUNITY): Payer: Self-pay

## 2022-05-08 ENCOUNTER — Other Ambulatory Visit (HOSPITAL_COMMUNITY): Payer: Self-pay

## 2022-05-12 ENCOUNTER — Other Ambulatory Visit (HOSPITAL_COMMUNITY): Payer: Self-pay

## 2022-05-14 ENCOUNTER — Other Ambulatory Visit: Payer: Self-pay | Admitting: Allergy

## 2022-05-14 ENCOUNTER — Other Ambulatory Visit (HOSPITAL_COMMUNITY): Payer: Self-pay

## 2022-05-14 MED ORDER — FLUTICASONE PROPIONATE HFA 110 MCG/ACT IN AERO
2.0000 | INHALATION_SPRAY | Freq: Every day | RESPIRATORY_TRACT | 2 refills | Status: DC
  Filled 2022-05-14: qty 12, 60d supply, fill #0
  Filled 2022-10-15: qty 12, 60d supply, fill #1
  Filled 2023-02-26: qty 12, 60d supply, fill #2

## 2022-05-18 DIAGNOSIS — D229 Melanocytic nevi, unspecified: Secondary | ICD-10-CM | POA: Diagnosis not present

## 2022-05-18 DIAGNOSIS — L810 Postinflammatory hyperpigmentation: Secondary | ICD-10-CM | POA: Diagnosis not present

## 2022-05-18 DIAGNOSIS — L578 Other skin changes due to chronic exposure to nonionizing radiation: Secondary | ICD-10-CM | POA: Diagnosis not present

## 2022-05-18 DIAGNOSIS — L821 Other seborrheic keratosis: Secondary | ICD-10-CM | POA: Diagnosis not present

## 2022-05-18 DIAGNOSIS — L814 Other melanin hyperpigmentation: Secondary | ICD-10-CM | POA: Diagnosis not present

## 2022-05-21 ENCOUNTER — Other Ambulatory Visit (HOSPITAL_COMMUNITY): Payer: Self-pay

## 2022-06-12 ENCOUNTER — Other Ambulatory Visit (HOSPITAL_COMMUNITY): Payer: Self-pay

## 2022-06-13 ENCOUNTER — Encounter: Payer: Self-pay | Admitting: Internal Medicine

## 2022-06-16 ENCOUNTER — Other Ambulatory Visit: Payer: Self-pay

## 2022-06-16 ENCOUNTER — Other Ambulatory Visit (HOSPITAL_COMMUNITY): Payer: Self-pay

## 2022-06-16 MED ORDER — "SAFETY-LOK TB SYRINGE 25G X 5/8"" 1 ML MISC"
3 refills | Status: DC
  Filled 2022-06-16: qty 20, 20d supply, fill #0

## 2022-06-17 ENCOUNTER — Other Ambulatory Visit (HOSPITAL_COMMUNITY): Payer: Self-pay

## 2022-06-18 ENCOUNTER — Other Ambulatory Visit: Payer: Self-pay | Admitting: Physical Medicine and Rehabilitation

## 2022-06-18 ENCOUNTER — Other Ambulatory Visit: Payer: Self-pay | Admitting: Obstetrics & Gynecology

## 2022-06-18 DIAGNOSIS — Z1231 Encounter for screening mammogram for malignant neoplasm of breast: Secondary | ICD-10-CM

## 2022-06-30 DIAGNOSIS — M5417 Radiculopathy, lumbosacral region: Secondary | ICD-10-CM | POA: Diagnosis not present

## 2022-06-30 DIAGNOSIS — M5116 Intervertebral disc disorders with radiculopathy, lumbar region: Secondary | ICD-10-CM | POA: Diagnosis not present

## 2022-07-08 ENCOUNTER — Ambulatory Visit: Payer: Medicare PPO | Admitting: Allergy

## 2022-07-10 ENCOUNTER — Ambulatory Visit
Admission: RE | Admit: 2022-07-10 | Discharge: 2022-07-10 | Disposition: A | Discharge status: Home / Self Care | Payer: Medicare PPO | Source: Ambulatory Visit

## 2022-07-10 DIAGNOSIS — Z1231 Encounter for screening mammogram for malignant neoplasm of breast: Secondary | ICD-10-CM | POA: Diagnosis not present

## 2022-07-15 ENCOUNTER — Other Ambulatory Visit: Payer: Self-pay | Admitting: Internal Medicine

## 2022-07-15 ENCOUNTER — Other Ambulatory Visit (HOSPITAL_BASED_OUTPATIENT_CLINIC_OR_DEPARTMENT_OTHER): Payer: Self-pay | Admitting: Obstetrics & Gynecology

## 2022-07-15 DIAGNOSIS — Z9229 Personal history of other drug therapy: Secondary | ICD-10-CM

## 2022-07-17 ENCOUNTER — Encounter: Payer: Self-pay | Admitting: Internal Medicine

## 2022-07-20 ENCOUNTER — Other Ambulatory Visit: Payer: Self-pay

## 2022-07-20 ENCOUNTER — Other Ambulatory Visit (HOSPITAL_COMMUNITY): Payer: Self-pay

## 2022-07-20 MED ORDER — CYANOCOBALAMIN 1000 MCG/ML IJ SOLN
1000.0000 ug | INTRAMUSCULAR | 1 refills | Status: DC
  Filled 2022-07-20: qty 3, 90d supply, fill #0
  Filled 2022-10-15: qty 3, 90d supply, fill #1

## 2022-07-23 ENCOUNTER — Other Ambulatory Visit (HOSPITAL_COMMUNITY): Payer: Self-pay

## 2022-07-27 ENCOUNTER — Other Ambulatory Visit (HOSPITAL_COMMUNITY): Payer: Self-pay

## 2022-07-29 DIAGNOSIS — M5136 Other intervertebral disc degeneration, lumbar region: Secondary | ICD-10-CM | POA: Diagnosis not present

## 2022-08-04 ENCOUNTER — Other Ambulatory Visit (HOSPITAL_COMMUNITY): Payer: Self-pay

## 2022-08-19 ENCOUNTER — Other Ambulatory Visit (HOSPITAL_COMMUNITY): Payer: Self-pay

## 2022-08-20 ENCOUNTER — Telehealth: Payer: Self-pay | Admitting: Internal Medicine

## 2022-08-20 NOTE — Telephone Encounter (Signed)
Patient called and said that her lab work from 2021-10-05 was coded improperly and patient's insurance did not pay for it.  Patient #  [phone]

## 2022-08-22 ENCOUNTER — Other Ambulatory Visit (HOSPITAL_BASED_OUTPATIENT_CLINIC_OR_DEPARTMENT_OTHER): Payer: Self-pay | Admitting: Obstetrics & Gynecology

## 2022-08-22 DIAGNOSIS — E559 Vitamin D deficiency, unspecified: Secondary | ICD-10-CM

## 2022-08-24 ENCOUNTER — Other Ambulatory Visit: Payer: Self-pay

## 2022-08-25 ENCOUNTER — Other Ambulatory Visit (HOSPITAL_COMMUNITY): Payer: Self-pay

## 2022-08-25 ENCOUNTER — Other Ambulatory Visit: Payer: Self-pay

## 2022-08-26 ENCOUNTER — Other Ambulatory Visit (HOSPITAL_COMMUNITY): Payer: Self-pay

## 2022-08-27 ENCOUNTER — Other Ambulatory Visit (HOSPITAL_COMMUNITY): Payer: Self-pay

## 2022-08-31 ENCOUNTER — Other Ambulatory Visit (HOSPITAL_COMMUNITY): Payer: Self-pay

## 2022-08-31 ENCOUNTER — Encounter (HOSPITAL_BASED_OUTPATIENT_CLINIC_OR_DEPARTMENT_OTHER): Payer: Self-pay | Admitting: Obstetrics & Gynecology

## 2022-08-31 ENCOUNTER — Ambulatory Visit (HOSPITAL_BASED_OUTPATIENT_CLINIC_OR_DEPARTMENT_OTHER): Payer: Medicare PPO | Admitting: Obstetrics & Gynecology

## 2022-08-31 VITALS — BP 139/69 | HR 68 | Ht 60.0 in | Wt 111.4 lb

## 2022-08-31 DIAGNOSIS — Z9229 Personal history of other drug therapy: Secondary | ICD-10-CM

## 2022-08-31 DIAGNOSIS — E559 Vitamin D deficiency, unspecified: Secondary | ICD-10-CM

## 2022-08-31 DIAGNOSIS — B0089 Other herpesviral infection: Secondary | ICD-10-CM | POA: Diagnosis not present

## 2022-08-31 MED ORDER — EST ESTROGENS-METHYLTEST HS 0.625-1.25 MG PO TABS: 1.0000 | ORAL_TABLET | ORAL | 1 refills | Status: DC

## 2022-08-31 MED ORDER — VITAMIN D (ERGOCALCIFEROL) 1.25 MG (50000 UNIT) PO CAPS: ORAL_CAPSULE | ORAL | 3 refills | Status: DC

## 2022-08-31 NOTE — Progress Notes (Unsigned)
77 y.o. G1P1 Married White or Caucasian female here recheck.  On HRT and takes three times weekly. Denies vaginal bleeding.  Does not want to stop HRT.  Aware of risks.  Has a good holiday.    Health Maintenance: PCP:  Dr. Quay Burow.  Last wellness appt was 02/18/2022.  Did blood work at that appt:   Vaccines are up to date:  RSV vaccine discussed today Colonoscopy:  09/03/2021 MMG:  06/18/2021 BMD:  09/2020, osteopenia Last pap smear:  not indicated.   H/o abnormal pap smear:  no    reports that she quit smoking about 54 years ago. Her smoking use included cigarettes. She has a 1.50 pack-year smoking history. She has never used smokeless tobacco. She reports current alcohol use of about 2.0 standard drinks of alcohol per week. She reports that she does not use drugs.  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 Vertell Limber   THYROIDECTOMY  2000   TOTAL ABDOMINAL HYSTERECTOMY W/ BILATERAL SALPINGOOPHORECTOMY  1999   UPPER GASTROINTESTINAL ENDOSCOPY      Current Outpatient Medications  Medication Sig Dispense Refill   cyanocobalamin (VITAMIN B12) 1000 MCG/ML injection Inject 1 mL (1,000 mcg total) into the skin every 30 (thirty) days. 3 mL 1   estrogen-methylTESTOSTERone (EST ESTROGENS-METHYLTEST HS) 0.625-1.25 MG tablet Take 1 tablet by mouth 3 (three) times a week. 36 tablet 1   fluticasone (FLOVENT HFA) 110 MCG/ACT inhaler Inhale 2 puffs into the lungs daily. Rinse  mouth after each use. 12 g 2   levothyroxine (SYNTHROID) 75 MCG tablet TAKE 1 TABLET (75 MCG) BY MOUTH DAILY BEFORE BREAKFAST 90 tablet 3   Risankizumab-rzaa (SKYRIZI PEN) 150 MG/ML SOAJ Inject subcutaneously once every 3 months 1 mL 3   sertraline (ZOLOFT) 100 MG tablet TAKE 2 TABLETS BY MOUTH DAILY 180 tablet 3   traZODone (DESYREL) 150 MG tablet TAKE 1 TABLET BY MOUTH AT BEDTIME 90 tablet 3   valACYclovir (VALTREX) 1000 MG tablet TAKE 2 TABLETS BY MOUTH EVERY 12 HOURS FOR 1 DAY AS NEEDED 20 tablet 3   Vitamin D, Ergocalciferol, (DRISDOL) 1.25 MG (50000 UNIT) CAPS capsule TAKE 1 CAPSULE BY MOUTH EVERY OTHER WEEK 6 capsule 3   rizatriptan (MAXALT) 10 MG tablet Take 1 tablet (10 mg total) by mouth as needed for migraine. May repeat in 2 hours if needed (Patient not taking: Reported on 08/31/2022) 10 tablet 4   TUBERCULIN SYR 1CC/25GX5/8" (SAFETY-LOK TB SYR 1CC/25GX5/8") 25G X 5/8" 1 ML MISC USE AS DIRECTED FOR B-12 INJECTIONS. 20 each 3   No current facility-administered medications for this visit.    Family History  Problem Relation Age of Onset   Heart attack Mother    Osteoporosis Mother  Lung cancer Mother    Colon polyps Mother    Arthritis Mother    COPD Father        chronic lung infections   Colon polyps Father    Colon cancer Neg Hx    Esophageal cancer Neg Hx    Stomach cancer Neg Hx    Rectal cancer Neg Hx    Breast cancer Neg Hx     Review of Systems  Constitutional: Negative.   Genitourinary: Negative.     Exam:   BP 139/69   Pulse 68   Ht 5' (1.524 m)   Wt 111 lb 6.4 oz (50.5 kg)   LMP 08/24/1997   BMI 21.76 kg/m   Height: 5' (152.4 cm)  General appearance: alert, cooperative and appears stated age Breasts: normal appearance, no masses or tenderness Abdomen: soft, non-tender; bowel sounds normal; no masses,  no organomegaly Lymph nodes: Cervical, supraclavicular, and axillary nodes normal.  No abnormal inguinal nodes palpated  Assessment/Plan: 1.  History of postmenopausal HRT - pt taking three times weekly.  Aware of risks.  Does not want to stop.   - estrogen-methylTESTOSTERone (EST ESTROGENS-METHYLTEST HS) 0.625-1.25 MG tablet; Take 1 tablet by mouth 3 (three) times a week.  Dispense: 36 tablet; Refill: 1  2. Vitamin D deficiency - had Vit D level 01/2022 - Vitamin D, Ergocalciferol, (DRISDOL) 1.25 MG (50000 UNIT) CAPS capsule; TAKE 1 CAPSULE BY MOUTH EVERY OTHER WEEK  Dispense: 6 capsule; Refill: 3

## 2022-09-17 ENCOUNTER — Other Ambulatory Visit (HOSPITAL_COMMUNITY): Payer: Self-pay

## 2022-09-18 ENCOUNTER — Other Ambulatory Visit (HOSPITAL_COMMUNITY): Payer: Self-pay

## 2022-10-01 ENCOUNTER — Other Ambulatory Visit (HOSPITAL_COMMUNITY): Payer: Self-pay

## 2022-10-15 ENCOUNTER — Other Ambulatory Visit (HOSPITAL_COMMUNITY): Payer: Self-pay

## 2022-10-15 ENCOUNTER — Other Ambulatory Visit: Payer: Self-pay

## 2022-10-19 ENCOUNTER — Other Ambulatory Visit (HOSPITAL_COMMUNITY): Payer: Self-pay

## 2022-12-23 DIAGNOSIS — M5127 Other intervertebral disc displacement, lumbosacral region: Secondary | ICD-10-CM | POA: Diagnosis not present

## 2022-12-23 DIAGNOSIS — M5416 Radiculopathy, lumbar region: Secondary | ICD-10-CM | POA: Diagnosis not present

## 2022-12-23 DIAGNOSIS — M47814 Spondylosis without myelopathy or radiculopathy, thoracic region: Secondary | ICD-10-CM | POA: Diagnosis not present

## 2022-12-23 DIAGNOSIS — M5136 Other intervertebral disc degeneration, lumbar region: Secondary | ICD-10-CM | POA: Diagnosis not present

## 2023-01-01 ENCOUNTER — Emergency Department (HOSPITAL_BASED_OUTPATIENT_CLINIC_OR_DEPARTMENT_OTHER): Payer: Medicare PPO

## 2023-01-01 ENCOUNTER — Other Ambulatory Visit: Payer: Self-pay

## 2023-01-01 ENCOUNTER — Emergency Department (HOSPITAL_BASED_OUTPATIENT_CLINIC_OR_DEPARTMENT_OTHER)
Admission: EM | Admit: 2023-01-01 | Discharge: 2023-01-01 | Disposition: A | Discharge status: Home / Self Care | Payer: Medicare PPO | Attending: Emergency Medicine | Admitting: Emergency Medicine

## 2023-01-01 DIAGNOSIS — S0990XA Unspecified injury of head, initial encounter: Secondary | ICD-10-CM | POA: Diagnosis not present

## 2023-01-01 DIAGNOSIS — S0003XA Contusion of scalp, initial encounter: Secondary | ICD-10-CM | POA: Insufficient documentation

## 2023-01-01 DIAGNOSIS — M79631 Pain in right forearm: Secondary | ICD-10-CM | POA: Diagnosis not present

## 2023-01-01 DIAGNOSIS — W19XXXA Unspecified fall, initial encounter: Secondary | ICD-10-CM

## 2023-01-01 DIAGNOSIS — W010XXA Fall on same level from slipping, tripping and stumbling without subsequent striking against object, initial encounter: Secondary | ICD-10-CM | POA: Diagnosis not present

## 2023-01-01 DIAGNOSIS — M79669 Pain in unspecified lower leg: Secondary | ICD-10-CM | POA: Diagnosis not present

## 2023-01-01 DIAGNOSIS — S81011A Laceration without foreign body, right knee, initial encounter: Secondary | ICD-10-CM | POA: Diagnosis not present

## 2023-01-01 MED ORDER — CEPHALEXIN 500 MG PO CAPS
500.0000 mg | ORAL_CAPSULE | Freq: Two times a day (BID) | ORAL | 0 refills | Status: DC
  Filled 2023-01-02: qty 10, 5d supply, fill #0

## 2023-01-01 NOTE — ED Provider Notes (Signed)
[place] [facility] DEPARTMENT AT [facility] Provider Note   CSN: [id number] Arrival date & time: 01/01/23  1920     History Chief Complaint  Patient presents with  . Fall  . Head Injury    HPI Jo Gomez is a 77 y.o. female presenting for ground-level fall.  States that she hit her head.  Was at an urgent care being evaluated for another fall where she hit her forearm so she is also having forearm and shin pain.  Treated her own laceration with Mastisol and Steri-Strips prior to arrival..  Patient's recorded medical, surgical, social, medication list and allergies were reviewed in the Snapshot window as part of the initial history.   Review of Systems   Review of Systems  Constitutional:  Negative for chills and fever.  HENT:  Negative for ear pain and sore throat.   Eyes:  Negative for pain and visual disturbance.  Respiratory:  Negative for cough and shortness of breath.   Cardiovascular:  Negative for chest pain and palpitations.  Gastrointestinal:  Negative for abdominal pain and vomiting.  Genitourinary:  Negative for dysuria and hematuria.  Musculoskeletal:  Negative for arthralgias and back pain.  Skin:  Negative for color change and rash.  Neurological:  Positive for headaches. Negative for seizures and syncope.  All other systems reviewed and are negative.   Physical Exam Updated Vital Signs BP (!) 151/90 (BP Location: Left Arm)   Pulse 65   Temp 98 F (36.7 C)   Resp 20   Ht 5' (1.524 m)   Wt 52.6 kg   LMP 08/24/1997   SpO2 100%   BMI 22.65 kg/m  Physical Exam Vitals and nursing note reviewed.  Constitutional:      General: She is not in acute distress.    Appearance: She is well-developed.  HENT:     Head: Normocephalic and atraumatic.  Eyes:     Conjunctiva/sclera: Conjunctivae normal.  Cardiovascular:     Rate and Rhythm: Normal rate and regular rhythm.     Heart sounds: No murmur heard. Pulmonary:     Effort: Pulmonary  effort is normal. No respiratory distress.     Breath sounds: Normal breath sounds.  Abdominal:     General: There is no distension.     Palpations: Abdomen is soft.     Tenderness: There is no abdominal tenderness. There is no right CVA tenderness or left CVA tenderness.  Musculoskeletal:        General: Signs of injury (Bruising to the posterior scalp.) present. No swelling or tenderness. Normal range of motion.     Cervical back: Neck supple.  Skin:    General: Skin is warm and dry.  Neurological:     General: No focal deficit present.     Mental Status: She is alert and oriented to person, place, and time. Mental status is at baseline.     Cranial Nerves: No cranial nerve deficit.     ED Course/ Medical Decision Making/ A&P    Procedures Procedures   Medications Ordered in ED Medications - No data to display Medical Decision Making:   Jo Gomez is a 77 y.o. female who presented to the ED today with a fall at their home detailed above. They are not on a blood thinner. Complete initial physical exam performed, notably the patient  was hemodynamically stable  in no acute distress. No obvious deformities or injuries appreciated on extensive physical exam including active range of motion of  all joints.     Reviewed and confirmed nursing documentation for past medical history, family history, social history.    Initial Assessment/Plan:   This is a patient presenting with a moderate blunt mechanism trauma.  As such, I have considered intracranial injuries including intracranial hemorrhage, intrathoracic injuries including blunt myocardial or blunt lung injury, blunt abdominal injuries including aortic dissection, bladder injury, spleen injury, liver injury and I have considered orthopedic injuries including extremity or spinal injury. This is most consistent with an acute life/limb threatening illness complicated by underlying chronic conditions.  With the patient's  presentation of moderate mechanism trauma but an otherwise reassuring exam, patient warrants targeted evaluation for potential traumatic injuries. Will proceed with targeted evaluation for potential injuries. Will proceed with CT Head Additionally, patient with pain at right forarm. Will evaluate with  XR. Images reviewed and agree with radiology interpretation.  DG Forearm Right  Result Date: 01/01/2023 CLINICAL DATA:  Arm pain EXAM: RIGHT FOREARM - 2 VIEW COMPARISON:  Right hand x-ray 05/14/2021 FINDINGS: Orthopedic screws in the distal radius appear unchanged. No acute fracture or hardware loosening. Joint spaces are maintained. Soft tissues are within normal limits. IMPRESSION: Orthopedic screws in the distal radius appear unchanged. No acute fracture or hardware loosening. Electronically Signed   By: Darliss Cheney M.D.   On: 01/01/2023 21:18   CT HEAD WO CONTRAST ( )  Result Date: 01/01/2023 CLINICAL DATA:  Head trauma EXAM: CT HEAD WITHOUT CONTRAST TECHNIQUE: Contiguous axial images were obtained from the base of the skull through the vertex without intravenous contrast. RADIATION DOSE REDUCTION: This exam was performed according to the departmental dose-optimization program which includes automated exposure control, adjustment of the mA and/or kV according to patient size and/or use of iterative reconstruction technique. COMPARISON:  Brain MRI 03/04/2016 FINDINGS: Brain: No acute territorial infarction or intracranial hemorrhage is visualized. Small hyperdense 6 mm nodule along the anterior third ventral consistent with small colloid cyst. The ventricles are stable in size. Vascular: No hyperdense vessels.  Carotid vascular calcification Skull: Normal. Negative for fracture or focal lesion. Sinuses/Orbits: No acute finding. Other: None IMPRESSION: 1. No CT evidence for acute intracranial abnormality. 2. Stable 6 mm colloid cyst. Electronically Signed   By: Jasmine Pang M.D.   On: 01/01/2023 21:09     Final Reassessment and Plan:   Objective findings negative.  Patient ambulatory tolerating p.o. intake in no acute distress. Given negative objective findings, well appearance patient stable for outpatient care management.  Will treat with Keflex for her wound which did not get full wound irrigation and is already approximated by her Steri-Strips.  Considered taking down the Steri-Strips but after discussion with patient she would rather do a short course of antibiotics and follow-up with PCP to ensure appropriate healing.  Disposition:  I have considered need for hospitalization, however, considering all of the above, I believe this patient is stable for discharge at this time.  Patient/family educated about specific return precautions for given chief complaint and symptoms.  Patient/family educated about follow-up with PCP.     Patient/family expressed understanding of return precautions and need for follow-up. Patient spoken to regarding all imaging and laboratory results and appropriate follow up for these results. All education provided in verbal form with additional information in written form. Time was allowed for answering of patient questions. Patient discharged.    Emergency Department Medication Summary:   Medications - No data to display     Clinical Impression:  1. Fall, initial encounter  Discharge   Final Clinical Impression(s) / ED Diagnoses Final diagnoses:  Fall, initial encounter    Rx / DC Orders ED Discharge Orders          Ordered    cephALEXin (KEFLEX) 500 MG capsule  2 times daily        01/01/23 2240              Glyn Ade, MD 01/01/23 2317

## 2023-01-01 NOTE — ED Triage Notes (Signed)
Patient arrives with complaints of having two mechanical falls. She fell earlier today on wet ground and then again at Urgent Care. She fell and hit her head at [facility] and she was sent here for a head CT.

## 2023-01-02 ENCOUNTER — Other Ambulatory Visit (HOSPITAL_COMMUNITY): Payer: Self-pay

## 2023-01-07 ENCOUNTER — Encounter: Payer: Self-pay | Admitting: Internal Medicine

## 2023-01-07 NOTE — Progress Notes (Signed)
Subjective:    Patient ID: Jo Gomez, female    DOB: 1946-08-07, 77 y.o.   MRN: [id number]      HPI Leeyah is here for  Chief Complaint  Patient presents with   Fall    Skinned left leg pretty bad (wants to discuss wound care)    5/10 - ED after ground level fall - hit her left lower leg- initially went to urgent care and hit her head and forearm while at urgent care and then was advised to go to the emergency room for imaging.  Ct head neg for bleed.  Xray forearm neg for fx.    Left lower leg is a skin tear that is healing, but very slowly.  She is keeping it bandaged with nonstick pads and paper tape-applying Vaseline.  She has a little soreness in the back of her head, but no chronic headaches or other concerning symptoms.   Chronic back pain with pain in both legs.  She is following with neurosurgery.  She has chronic back pain with pain down her legs.  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  The neurosurgeon discussed surgery, but was concerned about the risks of surgery and advised trying any intrathecal injection.  She is nervous about that with possible side effects.  She wonders about getting a second opinion  Medications and allergies reviewed with patient and updated if appropriate.  Current Outpatient Medications on File Prior to Visit  Medication Sig Dispense Refill   cephALEXin (KEFLEX) 500 MG capsule Take 1 capsule (500 mg total) by mouth 2 (two) times daily. 10 capsule 0   cyanocobalamin (VITAMIN B12) 1000 MCG/ML injection Inject 1 mL (1,000 mcg total) into the skin every 30 (thirty) days. 3 mL 1   estrogen-methylTESTOSTERone (EST ESTROGENS-METHYLTEST HS) 0.625-1.25 MG tablet Take 1 tablet by mouth 3 (three) times a week. 36 tablet 1   fluticasone (FLOVENT HFA) 110 MCG/ACT inhaler Inhale 2 puffs into the lungs daily. Rinse mouth after each use. 12 g 2   levothyroxine (SYNTHROID) 75 MCG tablet TAKE 1 TABLET (75 MCG) BY MOUTH  DAILY BEFORE BREAKFAST 90 tablet 3   Risankizumab-rzaa (SKYRIZI PEN) 150 MG/ML SOAJ Inject subcutaneously once every 3 months 1 mL 3   sertraline (ZOLOFT) 100 MG tablet TAKE 2 TABLETS BY MOUTH DAILY 180 tablet 3   traZODone (DESYREL) 150 MG tablet TAKE 1 TABLET BY MOUTH AT BEDTIME 90 tablet 3   TUBERCULIN SYR 1CC/25GX5/8" (SAFETY-LOK TB SYR 1CC/25GX5/8") 25G X 5/8" 1 ML MISC USE AS DIRECTED FOR B-12 INJECTIONS. 20 each 3   valACYclovir (VALTREX) 1000 MG tablet TAKE 2 TABLETS BY MOUTH EVERY 12 HOURS FOR 1 DAY AS NEEDED 20 tablet 3   Vitamin D, Ergocalciferol, (DRISDOL) 1.25 MG (50000 UNIT) CAPS capsule TAKE 1 CAPSULE BY MOUTH EVERY OTHER WEEK 6 capsule 3   No current facility-administered medications on file prior to visit.    Review of Systems  Constitutional:  Negative for fever.  Eyes:  Negative for visual disturbance.  Musculoskeletal:  Positive for back pain.  Neurological:  Negative for dizziness, light-headedness and headaches.       Objective:   Vitals:   01/08/23 0908  BP: 112/68  Pulse: 70  Temp: 98 F (36.7 C)  SpO2: 99%   BP Readings from Last 3 Encounters:  01/08/23 112/68  01/01/23 (!) 153/76  08/31/22 139/69   Wt Readings from Last 3 Encounters:  01/08/23 114  lb (51.7 kg)  01/01/23 116 lb (52.6 kg)  08/31/22 111 lb 6.4 oz (50.5 kg)   Body mass index is 22.26 kg/m.    Physical Exam Constitutional:      General: She is not in acute distress.    Appearance: Normal appearance. She is not ill-appearing.  HENT:     Head: Normocephalic and atraumatic.  Skin:    General: Skin is warm and dry.     Comments: Skin tear left lower anterior extremity-no surrounding erythema or swelling Area is clean  Neurological:     Mental Status: She is alert. Mental status is at baseline.  Psychiatric:        Mood and Affect: Mood normal.        Behavior: Behavior normal.        Thought Content: Thought content normal.        Judgment: Judgment normal.             Assessment & Plan:    See Problem List for Assessment and Plan of chronic medical problems.

## 2023-01-07 NOTE — Patient Instructions (Addendum)
      Medications changes include :   none     

## 2023-01-08 ENCOUNTER — Ambulatory Visit: Payer: Medicare PPO | Admitting: Internal Medicine

## 2023-01-08 VITALS — BP 112/68 | HR 70 | Temp 98.0°F | Ht 60.0 in | Wt 114.0 lb

## 2023-01-08 DIAGNOSIS — M8589 Other specified disorders of bone density and structure, multiple sites: Secondary | ICD-10-CM | POA: Diagnosis not present

## 2023-01-08 DIAGNOSIS — M5416 Radiculopathy, lumbar region: Secondary | ICD-10-CM | POA: Diagnosis not present

## 2023-01-08 DIAGNOSIS — F3289 Other specified depressive episodes: Secondary | ICD-10-CM | POA: Diagnosis not present

## 2023-01-08 DIAGNOSIS — Z13820 Encounter for screening for osteoporosis: Secondary | ICD-10-CM

## 2023-01-08 DIAGNOSIS — S81812A Laceration without foreign body, left lower leg, initial encounter: Secondary | ICD-10-CM | POA: Insufficient documentation

## 2023-01-08 NOTE — Assessment & Plan Note (Signed)
Subacute As a result of her first fall earlier this month Skin tear looks clean and no evidence of infection Continue applying Vaseline, using a nonstick pad and paper tape She will monitor closely

## 2023-01-08 NOTE — Assessment & Plan Note (Signed)
Chronic Controlled, Stable Continue sertraline 200 mg daily 

## 2023-01-08 NOTE — Assessment & Plan Note (Signed)
Chronic Following with neurosurgery Epidurals have not been helpful-temporary relief Surgery versus intrathecal injection-scheduled for intrathecal injection, but she is anxious about that and wonders about a second opinion regarding surgery I would recommend getting a second opinion

## 2023-01-11 DIAGNOSIS — M5417 Radiculopathy, lumbosacral region: Secondary | ICD-10-CM | POA: Diagnosis not present

## 2023-01-12 ENCOUNTER — Other Ambulatory Visit: Payer: Self-pay | Admitting: Internal Medicine

## 2023-01-13 ENCOUNTER — Other Ambulatory Visit (HOSPITAL_COMMUNITY): Payer: Self-pay

## 2023-01-13 MED ORDER — CYANOCOBALAMIN 1000 MCG/ML IJ SOLN
1000.0000 ug | INTRAMUSCULAR | 1 refills | Status: DC
  Filled 2023-01-13: qty 3, 90d supply, fill #0
  Filled 2023-04-19: qty 3, 90d supply, fill #1

## 2023-01-16 ENCOUNTER — Encounter: Payer: Self-pay | Admitting: Internal Medicine

## 2023-01-25 ENCOUNTER — Telehealth: Payer: Self-pay | Admitting: Internal Medicine

## 2023-01-25 NOTE — Telephone Encounter (Signed)
No name or # was left on msg. Do you have completed information.Marland KitchenRaechel Gomez

## 2023-01-25 NOTE — Telephone Encounter (Signed)
[employer] called stating they need updating on coding from the Dr because [facility] keep sending her bills. Please advise.

## 2023-01-28 ENCOUNTER — Encounter: Payer: Self-pay | Admitting: Internal Medicine

## 2023-02-02 ENCOUNTER — Ambulatory Visit (HOSPITAL_BASED_OUTPATIENT_CLINIC_OR_DEPARTMENT_OTHER): Payer: Medicare PPO

## 2023-02-03 ENCOUNTER — Ambulatory Visit (HOSPITAL_BASED_OUTPATIENT_CLINIC_OR_DEPARTMENT_OTHER)
Admission: RE | Admit: 2023-02-03 | Discharge: 2023-02-03 | Disposition: A | Discharge status: Home / Self Care | Payer: Medicare PPO | Source: Ambulatory Visit | Attending: Internal Medicine | Admitting: Internal Medicine

## 2023-02-03 DIAGNOSIS — M8589 Other specified disorders of bone density and structure, multiple sites: Secondary | ICD-10-CM | POA: Insufficient documentation

## 2023-02-03 DIAGNOSIS — Z13820 Encounter for screening for osteoporosis: Secondary | ICD-10-CM | POA: Insufficient documentation

## 2023-02-03 DIAGNOSIS — Z780 Asymptomatic menopausal state: Secondary | ICD-10-CM | POA: Diagnosis not present

## 2023-02-04 ENCOUNTER — Encounter: Payer: Self-pay | Admitting: Internal Medicine

## 2023-02-04 NOTE — Progress Notes (Signed)
Subjective:    Patient ID: Jo Gomez, female    DOB: 1946/06/14, 77 y.o.   MRN: [id number]      HPI Haeli is here for  Chief Complaint  Patient presents with   Fall    Pt has increased frequency in falls, lost of balance and pounding heart    Balance worse in am.  Feels very unsteady - gets better as day progresses.  Better balance when walking on toes. She thinks it started with her back issues.  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  6 weeks ago she had an injection by neurosurgery and she no longer has the lower back pain in the left leg pain.  Gets brain fog - no dizziness.    Frequent falling, loss of balance - she does not always remember how or why she falls.  All of a sudden she just finds her self on the ground.  On occasion she feels lightheaded, but she is not sure if the lightheaded precedes any of the falls and denies other obvious symptoms prior to the fall.  Heart pounding with certain activities - at one point it reached 171 - was walking up a hill doing yard work.  She tends to only feel the help pounding and palpitations with activity, not at rest   Dexa - severe osteopenia with high frax -bone density slightly worse.   Medications and allergies reviewed with patient and updated if appropriate.  Current Outpatient Medications on File Prior to Visit  Medication Sig Dispense Refill   cyanocobalamin (VITAMIN B12) 1000 MCG/ML injection Inject 1 mL (1,000 mcg total) into the skin every 30 (thirty) days. 3 mL 1   estrogen-methylTESTOSTERone (EST ESTROGENS-METHYLTEST HS) 0.625-1.25 MG tablet Take 1 tablet by mouth 3 (three) times a week. 36 tablet 1   fluticasone (FLOVENT HFA) 110 MCG/ACT inhaler Inhale 2 puffs into the lungs daily. Rinse mouth after each use. 12 g 2   levothyroxine (SYNTHROID) 75 MCG tablet TAKE 1 TABLET (75 MCG) BY MOUTH DAILY BEFORE BREAKFAST 90 tablet 3   Risankizumab-rzaa  (SKYRIZI PEN) 150 MG/ML SOAJ Inject subcutaneously once every 3 months 1 mL 3   sertraline (ZOLOFT) 100 MG tablet TAKE 2 TABLETS BY MOUTH DAILY 180 tablet 3   traZODone (DESYREL) 150 MG tablet TAKE 1 TABLET BY MOUTH AT BEDTIME 90 tablet 3   TUBERCULIN SYR 1CC/25GX5/8" (SAFETY-LOK TB SYR 1CC/25GX5/8") 25G X 5/8" 1 ML MISC USE AS DIRECTED FOR B-12 INJECTIONS. 20 each 3   valACYclovir (VALTREX) 1000 MG tablet TAKE 2 TABLETS BY MOUTH EVERY 12 HOURS FOR 1 DAY AS NEEDED 20 tablet 3   Vitamin D, Ergocalciferol, (DRISDOL) 1.25 MG (50000 UNIT) CAPS capsule TAKE 1 CAPSULE BY MOUTH EVERY OTHER WEEK 6 capsule 3   No current facility-administered medications on file prior to visit.    Review of Systems  Constitutional:  Negative for fever.  Respiratory:  Positive for shortness of breath (with activity/incline). Negative for cough and wheezing.   Cardiovascular:  Positive for palpitations. Negative for chest pain and leg swelling.  Neurological:  Positive for weakness (generalized weakness) and light-headedness (a little at times). Negative for dizziness and headaches.       Objective:   Vitals:   02/05/23 0926  BP: 110/62  Pulse: 67  Temp: 98.6 F (37 C)  SpO2: 96%   BP Readings from Last 3 Encounters:  02/05/23 110/62  01/08/23  112/68  01/01/23 (!) 153/76   Wt Readings from Last 3 Encounters:  02/05/23 112 lb (50.8 kg)  01/08/23 114 lb (51.7 kg)  01/01/23 116 lb (52.6 kg)   Body mass index is 21.87 kg/m.    Physical Exam Constitutional:      General: She is not in acute distress.    Appearance: Normal appearance.  HENT:     Head: Normocephalic and atraumatic.  Eyes:     Conjunctiva/sclera: Conjunctivae normal.  Neck:     Vascular: No carotid bruit.  Cardiovascular:     Rate and Rhythm: Normal rate and regular rhythm.     Heart sounds: Normal heart sounds.  Pulmonary:     Effort: Pulmonary effort is normal. No respiratory distress.     Breath sounds: Normal breath sounds.  No wheezing.  Musculoskeletal:     Cervical back: Neck supple.     Right lower leg: No edema.     Left lower leg: No edema.  Lymphadenopathy:     Cervical: No cervical adenopathy.  Skin:    General: Skin is warm and dry.     Findings: No rash.  Neurological:     Mental Status: She is alert. Mental status is at baseline.     Cranial Nerves: No cranial nerve deficit.     Sensory: Sensory deficit (Decree sensation in the right lower leg compared to the left) present.     Motor: No weakness (No focal weakness, possible mild lateral hand weakness, but strength still 5/5).     Gait: Gait abnormal (Unsteady when she first gets up).  Psychiatric:        Mood and Affect: Mood normal.        Behavior: Behavior normal.            Assessment & Plan:    See Problem List for Assessment and Plan of chronic medical problems.

## 2023-02-04 NOTE — Patient Instructions (Addendum)
   An EKG was done today.    A carotid ultrasound was ordered.   A heart monitor was ordered.    A referral was ordered Dr Allena Katz [facility] and Dr Cristal Deer with cardiology and someone will call you to schedule an appointment.    For your bones - fosamax, reclast, prolia, forteo (build bone up)    Return if symptoms worsen or fail to improve.

## 2023-02-05 ENCOUNTER — Ambulatory Visit: Payer: Medicare PPO | Admitting: Internal Medicine

## 2023-02-05 ENCOUNTER — Encounter: Payer: Self-pay | Admitting: Internal Medicine

## 2023-02-05 ENCOUNTER — Ambulatory Visit: Payer: Medicare PPO | Attending: Internal Medicine

## 2023-02-05 VITALS — BP 110/62 | HR 67 | Temp 98.6°F | Ht 60.0 in | Wt 112.0 lb

## 2023-02-05 DIAGNOSIS — R002 Palpitations: Secondary | ICD-10-CM

## 2023-02-05 DIAGNOSIS — R42 Dizziness and giddiness: Secondary | ICD-10-CM

## 2023-02-05 DIAGNOSIS — R2689 Other abnormalities of gait and mobility: Secondary | ICD-10-CM | POA: Diagnosis not present

## 2023-02-05 DIAGNOSIS — G4709 Other insomnia: Secondary | ICD-10-CM

## 2023-02-05 DIAGNOSIS — R296 Repeated falls: Secondary | ICD-10-CM

## 2023-02-05 DIAGNOSIS — E890 Postprocedural hypothyroidism: Secondary | ICD-10-CM

## 2023-02-05 NOTE — Progress Notes (Unsigned)
Enrolled patient for a 7 day Zio XT monitor to be mailed to patients home   DOD to read 

## 2023-02-05 NOTE — Assessment & Plan Note (Addendum)
Chronic I do not think her thyroid is contributing to the palpitations Last TSH in the normal range Continue levothyroxine 75 mcg daily Will recheck TSH at upcoming visit

## 2023-02-05 NOTE — Assessment & Plan Note (Signed)
Subacute Has been experiencing frequent falls for a while Her poor balance which is likely related to lumbar radiculopathy is contributing, but concerned if there is any other reasons for some of her recurrent falls Will refer to neurology for further evaluation ?  Cardiac cause-EKG, vascular ultrasound, refer to cardiology

## 2023-02-05 NOTE — Assessment & Plan Note (Signed)
Subacute This has been going on for a while Started around the time that she was having significant lower back pain and radiculopathy Does have some chronic lumbar radiculopathy on the right side, possibly left-sided as well Her back issues and neuropathy are likely contributing to some of her balance issues, but there may be other factors Will refer to neurology for further evaluation Discussed PT-she will think about if she wants to start this now or see neurology first

## 2023-02-05 NOTE — Assessment & Plan Note (Addendum)
New Has been experiencing heart pounding or palpitations with certain activities, does not experience daily at rest EKG today: NSR at 62 bpm, nonspecific T wave abnormality.  No changes compared to last EKG from 2021 Will request Holter monitor for 1 week Referral to cardiology Blood work normal 1 year ago-will hold off on repeating until her physical in August

## 2023-02-05 NOTE — Assessment & Plan Note (Signed)
Chronic Controlled, stable Continue trazodone 150 mg HS Feels like she is getting a good amount of sleep and good quality sleep.

## 2023-02-05 NOTE — Assessment & Plan Note (Signed)
Chronic Episodic Does have some lightheadedness-not necessarily related to standing or certain activities Not sure if this precedes falling or not Will check EKG, carotid ultrasound and refer to cardiology

## 2023-02-08 ENCOUNTER — Telehealth (HOSPITAL_BASED_OUTPATIENT_CLINIC_OR_DEPARTMENT_OTHER): Payer: Self-pay | Admitting: Obstetrics & Gynecology

## 2023-02-08 DIAGNOSIS — R42 Dizziness and giddiness: Secondary | ICD-10-CM

## 2023-02-08 DIAGNOSIS — R002 Palpitations: Secondary | ICD-10-CM | POA: Diagnosis not present

## 2023-02-08 NOTE — Telephone Encounter (Signed)
Patient called and needs to talk to doctor .

## 2023-02-10 ENCOUNTER — Encounter: Payer: Self-pay | Admitting: Neurology

## 2023-02-18 DIAGNOSIS — R002 Palpitations: Secondary | ICD-10-CM | POA: Diagnosis not present

## 2023-02-18 DIAGNOSIS — R42 Dizziness and giddiness: Secondary | ICD-10-CM | POA: Diagnosis not present

## 2023-02-23 ENCOUNTER — Encounter: Payer: Self-pay | Admitting: Internal Medicine

## 2023-02-23 ENCOUNTER — Ambulatory Visit (HOSPITAL_COMMUNITY)
Admission: RE | Admit: 2023-02-23 | Discharge: 2023-02-23 | Disposition: A | Discharge status: Home / Self Care | Payer: Medicare PPO | Source: Ambulatory Visit | Attending: Internal Medicine | Admitting: Internal Medicine

## 2023-02-23 DIAGNOSIS — R42 Dizziness and giddiness: Secondary | ICD-10-CM | POA: Insufficient documentation

## 2023-02-23 DIAGNOSIS — R2689 Other abnormalities of gait and mobility: Secondary | ICD-10-CM | POA: Diagnosis not present

## 2023-02-23 DIAGNOSIS — R296 Repeated falls: Secondary | ICD-10-CM | POA: Insufficient documentation

## 2023-02-23 DIAGNOSIS — I779 Disorder of arteries and arterioles, unspecified: Secondary | ICD-10-CM | POA: Insufficient documentation

## 2023-02-27 ENCOUNTER — Other Ambulatory Visit (HOSPITAL_COMMUNITY): Payer: Self-pay

## 2023-03-01 ENCOUNTER — Other Ambulatory Visit (HOSPITAL_COMMUNITY): Payer: Self-pay

## 2023-03-01 ENCOUNTER — Other Ambulatory Visit: Payer: Self-pay | Admitting: Internal Medicine

## 2023-03-01 MED ORDER — LEVOTHYROXINE SODIUM 75 MCG PO TABS
ORAL_TABLET | ORAL | 3 refills | Status: DC
  Filled 2023-03-01: qty 90, 90d supply, fill #0
  Filled 2023-05-21: qty 90, 90d supply, fill #1
  Filled 2023-08-27: qty 90, 90d supply, fill #2
  Filled 2023-11-23: qty 90, 90d supply, fill #3

## 2023-03-01 MED ORDER — SERTRALINE HCL 100 MG PO TABS
ORAL_TABLET | ORAL | 3 refills | Status: DC
  Filled 2023-03-01: qty 180, 90d supply, fill #0
  Filled 2023-05-21: qty 180, 90d supply, fill #1
  Filled 2023-08-23: qty 180, 90d supply, fill #2

## 2023-03-10 NOTE — Progress Notes (Signed)
Initial neurology clinic note  Reason for Evaluation: Consultation requested by Pincus Sanes, MD for an opinion regarding falls and imbalance. My final recommendations will be communicated back to the requesting physician by way of shared medical record or letter to requesting physician via [place] mail.  HPI: This is [name], a 77 y.o. right-handed female with a medical history of hypothyroidism, insomnia, depression, anxiety, OA, osteoporosis, asthma, B12 deficiency who presents to neurology clinic with the chief complaint of falls and imbalance. The patient is alone today.  Patient had low back surgery (L5-S1) about 3 years ago (at [facility]). She had extreme back pain radiating into her legs. Surgery helped with her symptoms. Her back pain returned in severity and she has been getting injections (at least 3 since). She had an intraspinal injection about 1.5 months ago. This has really helped with pain.   She has started to have imbalance and falls over the last couple of months. She will wake up and feel like she does not know where her feet are. Her balance will be off. It will come and go. She endorses falls as well. She will fall 3-4 times per week, particularly when out in her yard. She will feel disoriented and off balance then lower herself to the ground. She will feel like she is not picking up her feet and will trip. She has significant difficulty with balance when closing her eyes in the shower. She does not use an ambulatory device (cane or walker).   She does some difficulty with holding her urine. She has had a couple of accidents over the last week. She denies any changes in bowel. She denies saddle anesthesia. She is unsure when her last MRI lumbar spine was (maybe more recently than 2022, but not in last few months).  She endorses occasional cramps in her legs. She feels generally weak, including in the arms.  She denies significant neck pain.  She occasionally gets some shooting pains in arms, but nothing significant.  The patient denies symptoms suggestive of oculobulbar weakness including diplopia, ptosis, dysphagia, poor saliva control, dysarthria/dysphonia, impaired mastication, facial weakness/droop.  There are no neuromuscular respiratory weakness symptoms, particularly orthopnea>dyspnea.   She does not report any constitutional symptoms like fever, night sweats, anorexia or unintentional weight loss.  Patient has not done physical therapy recently.  Patient takes B12 injections every 2 weeks.  EtOH use: maybe 2 beers per week  Restrictive diet? No Family history of neuropathy/myopathy/neurologic disease? No  She has never had an EMG.  Of note, patient was seen in this office by Dr. Everlena Cooper in 2019 for cognitive issues and an incidental microbleed seen on MRI brain. Her MoCA at that time was 22/30 (missed 1 point on visuospatial/executive, 2 points on serial 7s and 5 points on delayed recall) per note. She was to have neuropsych testing, but the doctor set to do testing left prior to testing being completed.   MEDICATIONS:  Outpatient Encounter Medications as of 03/18/2023  Medication Sig   alendronate (FOSAMAX) 70 MG tablet Take 1 tablet (70 mg total) by mouth every 7 (seven) days. Take first thing in am with 6 oz. Water.  Be upright after taking.  Eat nothing for one hour.   cyanocobalamin (VITAMIN B12) 1000 MCG/ML injection Inject 1 mL (1,000 mcg total) into the skin every 30 (thirty) days.   [START ON 03/19/2023] estrogen-methylTESTOSTERone (EST ESTROGENS-METHYLTEST HS) 0.625-1.25 MG tablet Take 1 tablet by mouth 3 (three)  times a week.   fluticasone (FLOVENT HFA) 110 MCG/ACT inhaler Inhale 2 puffs into the lungs daily. Rinse mouth after each use.   levothyroxine (SYNTHROID) 75 MCG tablet TAKE 1 TABLET (75 MCG) BY MOUTH DAILY BEFORE BREAKFAST   Risankizumab-rzaa (SKYRIZI PEN) 150 MG/ML SOAJ Inject subcutaneously once  every 3 months   sertraline (ZOLOFT) 100 MG tablet TAKE 2 TABLETS BY MOUTH DAILY   traZODone (DESYREL) 150 MG tablet TAKE 1 TABLET BY MOUTH AT BEDTIME   TUBERCULIN SYR 1CC/25GX5/8" (SAFETY-LOK TB SYR 1CC/25GX5/8") 25G X 5/8" 1 ML MISC USE AS DIRECTED FOR B-12 INJECTIONS.   valACYclovir (VALTREX) 1000 MG tablet TAKE 2 TABLETS BY MOUTH EVERY 12 HOURS FOR 1 DAY AS NEEDED   Vitamin D, Ergocalciferol, (DRISDOL) 1.25 MG (50000 UNIT) CAPS capsule TAKE 1 CAPSULE BY MOUTH EVERY OTHER WEEK   [DISCONTINUED] estrogen-methylTESTOSTERone (EST ESTROGENS-METHYLTEST HS) 0.625-1.25 MG tablet Take 1 tablet by mouth 3 (three) times a week.   No facility-administered encounter medications on file as of 03/18/2023.    PAST MEDICAL HISTORY: 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   Asthma    Depression    GERD (gastroesophageal reflux disease)    diet controlled, no med   History of endoscopy    HSV infection    IgA deficiency (HCC)    Low vitamin B12 level    Osteoporosis    Ovarian cyst    TAH/BSO   Plaque psoriasis    Shingles    Thyroid dysfunction     PAST SURGICAL HISTORY: Past Surgical History:  Procedure Laterality Date   broken arm     surgery for this   BROW LIFT  08/2013   COLONOSCOPY     LUMBAR DISC SURGERY  10/2020   L-4, L-5, S-1, Dr. Venetia Maxon   THYROIDECTOMY  2000   TOTAL ABDOMINAL HYSTERECTOMY W/ BILATERAL SALPINGOOPHORECTOMY  1999   UPPER GASTROINTESTINAL ENDOSCOPY      ALLERGIES: Allergies  Allergen Reactions   Gammagard S-D Less Iga [Immune Globulin (Human)] Anaphylaxis    Have to have triple washed blood products due to this allergy pt states   Clarithromycin Other (See Comments)    Severe leg and muscle cramps   Erythromycin Base Nausea And Vomiting   Levaquin [Levofloxacin In D5w] Other (See Comments)    tendonitis   Other Rash and Other (See Comments)    IGA Deficiency - catches a lot of viruses    Sulfonamide Derivatives Rash   Tetracycline Rash    FAMILY HISTORY: Family History  Problem Relation Age of Onset   Heart attack Mother    Osteoporosis Mother    Lung cancer Mother    Colon polyps Mother    Arthritis Mother    COPD Father        chronic lung infections   Colon polyps Father    Colon cancer Neg Hx    Esophageal cancer Neg Hx    Stomach cancer Neg Hx    Rectal cancer Neg Hx    Breast cancer Neg Hx     SOCIAL HISTORY: Social History   Tobacco Use   Smoking status: Former    Current packs/day: 0.00    Average packs/day: 0.5 packs/day for 3.0 years (1.5 ttl pk-yrs)    Types: Cigarettes    Start date: 08/24/1965    Quit date: 08/24/1968    Years since quitting: 11.6  Smokeless tobacco: Never   Tobacco comments:    in college  Vaping Use   Vaping status: Never Used  Substance Use Topics   Alcohol use: Yes    Alcohol/week: 2.0 standard drinks of alcohol    Types: 2 Standard drinks or equivalent per week    Comment: 2 glasses a week   Drug use: No   Social History   Social History Narrative   Patient is right-handed. She lives with her wife in a split level house. She occasionally drinks coffee.   Are you right handed or left handed?    Are you currently employed ?    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 wife   What type of home do you live in: 1 story or 2 story? one         OBJECTIVE: PHYSICAL EXAM: BP 128/84   Pulse 68   Ht 5' (1.524 m)   Wt 115 lb 12.8 oz (52.5 kg)   LMP 08/24/1997   SpO2 97%   BMI 22.62 kg/m   General: General appearance: Awake and alert. No distress. Cooperative with exam.  Skin: No obvious rash or jaundice. HEENT: Atraumatic. Anicteric. Lungs: Non-labored breathing on room air  Extremities: No edema. Psych: Affect appropriate.  Neurological: Mental Status: Alert. Speech fluent. No pseudobulbar affect Cranial Nerves: CNII: No RAPD.  Visual fields grossly intact. CNIII, IV, VI: PERRL. No nystagmus. EOMI. CN V: Facial sensation intact bilaterally to fine touch. CN VII: Facial muscles symmetric and strong. No ptosis at rest. CN VIII: Hearing grossly intact bilaterally. CN IX: No hypophonia. CN X: Palate elevates symmetrically. CN XI: Full strength shoulder shrug bilaterally. CN XII: Tongue protrusion full and midline. No atrophy or fasciculations. No significant dysarthria Motor: Tone is normal. No fasciculations in extremities. No atrophy.  Individual muscle group testing (MRC grade out of 5):  Movement     Neck flexion 5    Neck extension 5     Right Left   Shoulder abduction 5 5   Shoulder adduction 5 5   Shoulder ext rotation 5 5   Shoulder int rotation 5 5   Elbow flexion 5 5   Elbow extension 5 5   Wrist extension 5 5   Wrist flexion 5 5   Finger abduction - FDI 5 5   Finger abduction - ADM 5 5   Finger extension 5 5   Finger distal flexion - 2/3 5 5    Finger distal flexion - 4/5 5 5    Thumb flexion - FPL 5 5   Thumb abduction - APB 5 5    Hip flexion 5 5   Hip extension 5 5   Hip adduction 5 5   Hip abduction 5 5   Knee extension 5 5   Knee flexion 5 5   Dorsiflexion 5 5   Plantarflexion 5 5   Inversion 5 5   Eversion 5 5     Reflexes:  Right Left   Bicep 2+ 2+   Tricep 2+ 2+   BrRad 2+ 2+   Knee 2+ 2+   Ankle 2+ 2+    Pathological Reflexes: Babinski: flexor response bilaterally Hoffman: absent bilaterally Troemner: absent bilaterally Sensation: Pinprick: Intact in bilateral upper and lower extremities Vibration: Absent in bilateral great toes, diminished in bilateral ankles, fully present at bilateral patella Proprioception: Diminished in bilateral great toes Coordination: Intact finger-to- nose-finger  bilaterally. Romberg positive. Gait: Able to rise from chair with arms crossed unassisted. Narrow-based gait. Some imbalance when turning. Not able to tandem walk. Able to walk  on toes, difficulty walking on her heels.  Lab and Test Review: Internal labs: 02/18/22: CBC unremarkable CMP significant for low sodium (132) which is chronic Lipid panel: total cholesterol 173, LDL 93 TSH: 1.68 B12: 435 (lowest was 217 about 13 years ago) Vit D: wnl HbA1c: 5.3  ANA (09/16/21): negative  Imaging: Carotid ultrasound (02/23/23): Summary:  Right Carotid: There is no evidence of stenosis in the right ICA. The extracranial vessels were near-normal with only minimal wall thickening or plaque.   Left Carotid: Velocities in the left ICA are consistent with a 1-39% stenosis.   Vertebrals: Bilateral vertebral arteries demonstrate antegrade flow.  Subclavians: Normal flow hemodynamics were seen in bilateral subclavian arteries.   Holter monitor (02/08/23-02/12/23): Predominant rhythm was normal sinus rhythm with an average heart rate of 69 bpm and ranged from 48 to 114 bpm   Rare PACs, atrial couplets, atrial triplets   Rare PVC  CT head wo contrast (01/01/23): (for head trauma) FINDINGS: Brain: No acute territorial infarction or intracranial hemorrhage is visualized. Small hyperdense 6 mm nodule along the anterior third ventral consistent with small colloid cyst. The ventricles are stable in size.   Vascular: No hyperdense vessels.  Carotid vascular calcification   Skull: Normal. Negative for fracture or focal lesion.   Sinuses/Orbits: No acute finding.   Other: None   IMPRESSION: 1. No CT evidence for acute intracranial abnormality. 2. Stable 6 mm colloid cyst.  MRI lumbar spine w/wo contrast (12/31/20): FINDINGS: Segmentation:  5 lumbar type vertebral bodies.   Alignment:  No malalignment.   Vertebrae: No fracture or primary bone lesion. Mild discogenic endplate edema and enhancement at L5-S1 could contribute to low back pain.   Conus medullaris and cauda equina: Conus extends to the L1 level. Conus and cauda equina appear normal.   Paraspinal and other  soft tissues: Negative   Disc levels:   No abnormality from T11-12 through L1-2.   L2-3: Minimal disc bulge.  No stenosis.   L3-4: Minimal disc bulge.  No stenosis.   L4-5: Broad-based disc herniation more prominent towards the left. Mild facet and ligamentous hypertrophy. Narrowing of the lateral recesses left more than right. Either L5 nerve could be affected, more likely the left. L4 nerves appear to exit the foramina without gross compression.   L5-S1: Disc degeneration with loss of disc height. Shallow protrusion of the disc more prominent towards the left. Mild facet and ligamentous hypertrophy. Mild stenosis of both subarticular lateral recesses and neural foramina.   IMPRESSION: L4-5: Shallow disc herniation more prominent towards the left. Facet and ligamentous hypertrophy. Stenosis of the lateral recesses left more than right. Neural compression could occur at this level, particularly on the left. This has worsened since 2016.   L5-S1: Endplate osteophytes and shallow disc protrusion. Facet and ligamentous hypertrophy. Narrowing of both subarticular lateral recesses and foramina which could possibly cause neural compression, particularly in the lateral recesses. Slight worsening since 2016.  MRI brain w/wo contrast (01/04/18 - external): I reviewed images and agree with findings below.    ASSESSMENT: Jo Gomez is a 77 y.o. female who presents for evaluation of imbalance and falls. She has a relevant medical history of hypothyroidism, insomnia, depression, anxiety, OA, osteoporosis, asthma, B12 deficiency. Her neurological examination is pertinent for diminished sensation to vibration and proprioception in distal lower extremities  and positive Romberg. 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 Patient's symptoms of  imbalance and falls are likely due to poor sensation in bilateral distal lower extremities. 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 I will get labs to look for treatable causes and get an EMG to further clarify. I will also send patient to PT to work on balance.  PLAN: -Blood work: B1, B12, HbA1c, IFE, vit E, copper -EMG: PN (R > L) -Will attempt to get records from [facility] regarding any more recent MRIs of lumbar spine -PT for imbalance  -Return to clinic 3 months  The impression above as well as the plan as outlined below were extensively discussed with the patient who voiced understanding. All questions were answered to their satisfaction.  The patient was counseled on pertinent fall precautions per the printed material provided today, and as noted under the "Patient Instructions" section below.  When available, results of the above investigations and possible further recommendations will be communicated to the patient via telephone/MyChart. Patient to call office if not contacted after expected testing turnaround time.   Total time spent reviewing records, interview, history/exam, documentation, and coordination of care on day of encounter:  60 min   Thank you for allowing me to participate in patient's care.  If I can answer any additional questions, I would be pleased to do so.  Jacquelyne Balint, MD   CC: Pincus Sanes, MD [address] [place] [address]  CC: Referring provider: Pincus Sanes, MD [address] [place],  [place] [address]

## 2023-03-18 ENCOUNTER — Other Ambulatory Visit (HOSPITAL_COMMUNITY): Payer: Self-pay

## 2023-03-18 ENCOUNTER — Ambulatory Visit: Payer: Medicare PPO | Admitting: Neurology

## 2023-03-18 ENCOUNTER — Encounter (HOSPITAL_BASED_OUTPATIENT_CLINIC_OR_DEPARTMENT_OTHER): Payer: Self-pay | Admitting: Obstetrics & Gynecology

## 2023-03-18 ENCOUNTER — Other Ambulatory Visit (INDEPENDENT_AMBULATORY_CARE_PROVIDER_SITE_OTHER): Payer: Medicare PPO

## 2023-03-18 ENCOUNTER — Encounter: Payer: Self-pay | Admitting: Neurology

## 2023-03-18 ENCOUNTER — Ambulatory Visit (HOSPITAL_BASED_OUTPATIENT_CLINIC_OR_DEPARTMENT_OTHER): Payer: Medicare PPO | Admitting: Obstetrics & Gynecology

## 2023-03-18 VITALS — BP 139/81 | HR 78 | Ht 60.0 in | Wt 115.2 lb

## 2023-03-18 VITALS — BP 128/84 | HR 68 | Ht 60.0 in | Wt 115.8 lb

## 2023-03-18 DIAGNOSIS — R2689 Other abnormalities of gait and mobility: Secondary | ICD-10-CM

## 2023-03-18 DIAGNOSIS — Z9229 Personal history of other drug therapy: Secondary | ICD-10-CM | POA: Diagnosis not present

## 2023-03-18 DIAGNOSIS — M85851 Other specified disorders of bone density and structure, right thigh: Secondary | ICD-10-CM | POA: Diagnosis not present

## 2023-03-18 DIAGNOSIS — R202 Paresthesia of skin: Secondary | ICD-10-CM | POA: Diagnosis not present

## 2023-03-18 DIAGNOSIS — R296 Repeated falls: Secondary | ICD-10-CM | POA: Diagnosis not present

## 2023-03-18 DIAGNOSIS — R200 Anesthesia of skin: Secondary | ICD-10-CM

## 2023-03-18 DIAGNOSIS — M85852 Other specified disorders of bone density and structure, left thigh: Secondary | ICD-10-CM

## 2023-03-18 DIAGNOSIS — Z131 Encounter for screening for diabetes mellitus: Secondary | ICD-10-CM

## 2023-03-18 MED ORDER — ALENDRONATE SODIUM 70 MG PO TABS
70.0000 mg | ORAL_TABLET | ORAL | 3 refills | Status: DC
Start: 2023-03-18 — End: 2023-03-25
  Filled 2023-03-18: qty 12, 84d supply, fill #0

## 2023-03-18 MED ORDER — EST ESTROGENS-METHYLTEST HS 0.625-1.25 MG PO TABS: 1.0000 | ORAL_TABLET | ORAL | 3 refills | Status: DC

## 2023-03-18 NOTE — Patient Instructions (Signed)
I saw you today for imbalance and falls. I think this is due to nerve damage in your legs, perhaps from your back. I want to investigate further with the following: -Blood work today -Muscle and nerve test called EMG (see more information below)  I will be in touch when I have the results of these to discuss next steps.  I also want to refer you to physical therapy for imbalance.  I will try to get more records from [facility].  I will see you back in clinic in about 3 months.  The physicians and staff at [facility] are committed to providing excellent care. You may receive a survey requesting feedback about your experience at our office. We strive to receive "very good" responses to the survey questions. If you feel that your experience would prevent you from giving the office a "very good " response, please contact our office to try to remedy the situation. We may be reached at [phone]. Thank you for taking the time out of your busy day to complete the survey.  Jacquelyne Balint, MD [facility]   ELECTROMYOGRAM AND NERVE CONDUCTION STUDIES (EMG/NCS) INSTRUCTIONS  How to Prepare The neurologist conducting the EMG will need to know if you have certain medical conditions. Tell the neurologist and other EMG lab personnel if you: Have a pacemaker or any other electrical medical device Take blood-thinning medications Have hemophilia, a blood-clotting disorder that causes prolonged bleeding Bathing Take a shower or bath shortly before your exam in order to remove oils from your skin. Don't apply lotions or creams before the exam.  What to Expect You'll likely be asked to change into a hospital gown for the procedure and lie down on an examination table. The following explanations can help you understand what will happen during the exam.  Electrodes. The neurologist or a technician places surface electrodes at various locations on your skin depending on where  you're experiencing symptoms. Or the neurologist may insert needle electrodes at different sites depending on your symptoms.  Sensations. The electrodes will at times transmit a tiny electrical current that you may feel as a twinge or spasm. The needle electrode may cause discomfort or pain that usually ends shortly after the needle is removed. If you are concerned about discomfort or pain, you may want to talk to the neurologist about taking a short break during the exam.  Instructions. During the needle EMG, the neurologist will assess whether there is any spontaneous electrical activity when the muscle is at rest - activity that isn't present in healthy muscle tissue - and the degree of activity when you slightly contract the muscle.  He or she will give you instructions on resting and contracting a muscle at appropriate times. Depending on what muscles and nerves the neurologist is examining, he or she may ask you to change positions during the exam.  After your EMG You may experience some temporary, minor bruising where the needle electrode was inserted into your muscle. This bruising should fade within several days. If it persists, contact your primary care doctor.   Preventing Falls at [facility] are common, often dreaded events in the lives of older people. Aside from the obvious injuries and even death that may result, fall can cause wide-ranging consequences including loss of independence, mental decline, decreased activity and mobility. Younger people are also at risk of falling, especially those with chronic illnesses and fatigue.  Ways to reduce risk for falling Examine diet and  medications. Warm foods and alcohol dilate blood vessels, which can lead to dizziness when standing. Sleep aids, antidepressants and pain medications can also increase the likelihood of a fall.  Get a vision exam. Poor vision, cataracts and glaucoma increase the chances of falling.  Check foot gear. Shoes  should fit snugly and have a sturdy, nonskid sole and a broad, low heel  Participate in a physician-approved exercise program to build and maintain muscle strength and improve balance and coordination. Programs that use ankle weights or stretch bands are excellent for muscle-strengthening. Water aerobics programs and low-impact Tai Chi programs have also been shown to improve balance and coordination.  Increase vitamin D intake. Vitamin D improves muscle strength and increases the amount of calcium the body is able to absorb and deposit in bones.  How to prevent falls from common hazards Floors - Remove all loose wires, cords, and throw rugs. Minimize clutter. Make sure rugs are anchored and smooth. Keep furniture in its usual place.  Chairs -- Use chairs with straight backs, armrests and firm seats. Add firm cushions to existing pieces to add height.  Bathroom - Install grab bars and non-skid tape in the tub or shower. Use a bathtub transfer bench or a shower chair with a back support Use an elevated toilet seat and/or safety rails to assist standing from a low surface. Do not use towel racks or bathroom tissue holders to help you stand.  Lighting - Make sure halls, stairways, and entrances are well-lit. Install a night light in your bathroom or hallway. Make sure there is a light switch at the top and bottom of the staircase. Turn lights on if you get up in the middle of the night. Make sure lamps or light switches are within reach of the bed if you have to get up during the night.  Kitchen - Install non-skid rubber mats near the sink and stove. Clean spills immediately. Store frequently used utensils, pots, pans between waist and eye level. This helps prevent reaching and bending. Sit when getting things out of lower cupboards.  Living room/ Bedrooms - Place furniture with wide spaces in between, giving enough room to move around. Establish a route through the living room that gives you something  to hold onto as you walk.  Stairs - Make sure treads, rails, and rugs are secure. Install a rail on both sides of the stairs. If stairs are a threat, it might be helpful to arrange most of your activities on the lower level to reduce the number of times you must climb the stairs.  Entrances and doorways - Install metal handles on the walls adjacent to the doorknobs of all doors to make it more secure as you travel through the doorway.  Tips for maintaining balance Keep at least one hand free at all times. Try using a backpack or fanny pack to hold things rather than carrying them in your hands. Never carry objects in both hands when walking as this interferes with keeping your balance.  Attempt to swing both arms from front to back while walking. This might require a conscious effort if Parkinson's disease has diminished your movement. It will, however, help you to maintain balance and posture, and reduce fatigue.  Consciously lift your feet off of the ground when walking. Shuffling and dragging of the feet is a common culprit in losing your balance.  When trying to navigate turns, use a "U" technique of facing forward and making a wide turn, rather than pivoting  sharply.  Try to stand with your feet shoulder-length apart. When your feet are close together for any length of time, you increase your risk of losing your balance and falling.  Do one thing at a time. Don't try to walk and accomplish another task, such as reading or looking around. The decrease in your automatic reflexes complicates motor function, so the less distraction, the better.  Do not wear rubber or gripping soled shoes, they might "catch" on the floor and cause tripping.  Move slowly when changing positions. Use deliberate, concentrated movements and, if needed, use a grab bar or walking aid. Count 15 seconds between each movement. For example, when rising from a seated position, wait 15 seconds after standing to begin  walking.  If balance is a continuous problem, you might want to consider a walking aid such as a cane, walking stick, or walker. Once you've mastered walking with help, you might be ready to try it on your own again.

## 2023-03-19 ENCOUNTER — Encounter: Payer: Self-pay | Admitting: Neurology

## 2023-03-20 ENCOUNTER — Encounter: Payer: Self-pay | Admitting: Neurology

## 2023-03-20 NOTE — Progress Notes (Signed)
GYNECOLOGY  VISIT  CC:   HRT check  HPI: 77 y.o. G1P1 Married White or Caucasian female here for recheck on HRT.  Pt taking three times weekly.  Aware of stroke, MI and clotting risk.  She desires to continue.  Has some questions about BMD and treatment.  She has considered forteo.  Pt just has osteopenia.  Discussed other options considering most recent results.  She is exercising and taking supplements.  She would like to restart Fosamax.  Reminded about side effects/risks.  Rx will be sent into pharmacy.   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 3 mL 1   fluticasone (FLOVENT HFA) 110 MCG/ACT inhaler Inhale 2 puffs into the lungs daily. Rinse mouth after each use. 12 g 2   levothyroxine (SYNTHROID) 75 MCG tablet TAKE 1 TABLET (75 MCG) BY MOUTH DAILY BEFORE BREAKFAST 90 tablet 3   Risankizumab-rzaa (SKYRIZI PEN) 150 MG/ML SOAJ Inject subcutaneously once every 3 months 1 mL 3   sertraline (ZOLOFT) 100 MG tablet TAKE 2 TABLETS BY MOUTH DAILY 180 tablet 3   traZODone (DESYREL) 150 MG tablet TAKE 1 TABLET BY MOUTH AT BEDTIME 90 tablet 3   TUBERCULIN SYR 1CC/25GX5/8" (SAFETY-LOK TB SYR 1CC/25GX5/8") 25G X 5/8" 1 ML MISC USE AS DIRECTED FOR B-12 INJECTIONS. 20 each 3   valACYclovir (VALTREX) 1000 MG tablet TAKE 2 TABLETS BY MOUTH EVERY 12 HOURS FOR 1 DAY AS NEEDED 20 tablet 3   Vitamin D, Ergocalciferol, (DRISDOL) 1.25 MG  (50000 UNIT) CAPS capsule TAKE 1 CAPSULE BY MOUTH EVERY OTHER WEEK 6 capsule 3   No current facility-administered medications on file prior to visit.    ALLERGIES: Gammagard s-d less iga [immune globulin (human)], Clarithromycin, Erythromycin base, Levaquin [levofloxacin in d5w], Other, Sulfonamide derivatives, and Tetracycline  SH:  married, non smoker  Review of Systems  Constitutional: Negative.   Genitourinary: Negative.     PHYSICAL EXAMINATION:    BP 139/81 (BP Location: Right Arm, Patient Position: Sitting, Cuff Size: Normal)   Pulse 78   Ht 5' (1.524 m) Comment: Reported  Wt 115 lb 3.2 oz (52.3 kg)   LMP 08/24/1997   BMI 22.50 kg/m     Physical Exam Constitutional:      Appearance: Normal appearance.  Neurological:     General: No focal deficit present.     Mental Status: She is alert.  Psychiatric:        Mood and Affect: Mood normal.        Behavior: Behavior normal.      Assessment/Plan: 1. History of postmenopausal HRT - will continue HRT.  Pt aware of risks - estrogen-methylTESTOSTERone (EST ESTROGENS-METHYLTEST HS) 0.625-1.25 MG tablet; Take 1 tablet by mouth 3 (three) times a week.  Dispense: 36  tablet; Refill: 3  2. Osteopenia of necks of both femurs - will restart fosamax at this time - Recheck BMD 2 years - alendronate (FOSAMAX) 70 MG tablet; Take 1 tablet (70 mg total) by mouth every 7 (seven) days. Take first thing in am with 6 oz. Water.  Be upright after taking.  Eat nothing for one hour.  Dispense: 12 tablet; Refill: 3

## 2023-03-24 ENCOUNTER — Encounter: Payer: Self-pay | Admitting: Internal Medicine

## 2023-03-24 ENCOUNTER — Ambulatory Visit (INDEPENDENT_AMBULATORY_CARE_PROVIDER_SITE_OTHER): Payer: Medicare PPO

## 2023-03-24 VITALS — BP 128/84 | Ht 60.0 in | Wt 116.0 lb

## 2023-03-24 DIAGNOSIS — Z0000 Encounter for general adult medical examination without abnormal findings: Secondary | ICD-10-CM

## 2023-03-24 NOTE — Patient Instructions (Addendum)
Blood work was ordered.   The lab is on the first floor.    Medications changes include :   None    A referral was ordered behavioral health and someone will call you to schedule an appointment.     Return in about 1 year (around 03/24/2024) for Physical Exam.     Health Maintenance, Female Adopting a healthy lifestyle and getting preventive care are important in promoting health and wellness. Ask your health care provider about: The right schedule for you to have regular tests and exams. Things you can do on your own to prevent diseases and keep yourself healthy. What should I know about diet, weight, and exercise? Eat a healthy diet  Eat a diet that includes plenty of vegetables, fruits, low-fat dairy products, and lean protein. Do not eat a lot of foods that are high in solid fats, added sugars, or sodium. Maintain a healthy weight Body mass index (BMI) is used to identify weight problems. It estimates body fat based on height and weight. Your health care provider can help determine your BMI and help you achieve or maintain a healthy weight. Get regular exercise Get regular exercise. This is one of the most important things you can do for your health. Most adults should: Exercise for at least 150 minutes each week. The exercise should increase your heart rate and make you sweat (moderate-intensity exercise). Do strengthening exercises at least twice a week. This is in addition to the moderate-intensity exercise. Spend less time sitting. Even light physical activity can be beneficial. Watch cholesterol and blood lipids Have your blood tested for lipids and cholesterol at 75 years of age, then have this test every 5 years. Have your cholesterol levels checked more often if: Your lipid or cholesterol levels are high. You are older than 75 years of age. You are at high risk for heart disease. What should I know about cancer screening? Depending on your health history  and family history, you may need to have cancer screening at various ages. This may include screening for: Breast cancer. Cervical cancer. Colorectal cancer. Skin cancer. Lung cancer. What should I know about heart disease, diabetes, and high blood pressure? Blood pressure and heart disease High blood pressure causes heart disease and increases the risk of stroke. This is more likely to develop in people who have high blood pressure readings or are overweight. Have your blood pressure checked: Every 3-5 years if you are 61-21 years of age. Every year if you are 11 years old or older. Diabetes Have regular diabetes screenings. This checks your fasting blood sugar level. Have the screening done: Once every three years after age 22 if you are at a normal weight and have a low risk for diabetes. More often and at a younger age if you are overweight or have a high risk for diabetes. What should I know about preventing infection? Hepatitis B If you have a higher risk for hepatitis B, you should be screened for this virus. Talk with your health care provider to find out if you are at risk for hepatitis B infection. Hepatitis C Testing is recommended for: Everyone born from [birth year] through 1963. Anyone with known risk factors for hepatitis C. Sexually transmitted infections (STIs) Get screened for STIs, including gonorrhea and chlamydia, if: You are sexually active and are younger than 75 years of age. You are older than 75 years of age and your health care provider tells you that you are at  risk for this type of infection. Your sexual activity has changed since you were last screened, and you are at increased risk for chlamydia or gonorrhea. Ask your health care provider if you are at risk. Ask your health care provider about whether you are at high risk for HIV. Your health care provider may recommend a prescription medicine to help prevent HIV infection. If you choose to take medicine to prevent  HIV, you should first get tested for HIV. You should then be tested every 3 months for as long as you are taking the medicine. Pregnancy If you are about to stop having your period (premenopausal) and you may become pregnant, seek counseling before you get pregnant. Take 400 to 800 micrograms (mcg) of folic acid every day if you become pregnant. Ask for birth control (contraception) if you want to prevent pregnancy. Osteoporosis and menopause Osteoporosis is a disease in which the bones lose minerals and strength with aging. This can result in bone fractures. If you are 77 years old or older, or if you are at risk for osteoporosis and fractures, ask your health care provider if you should: Be screened for bone loss. Take a calcium or vitamin D supplement to lower your risk of fractures. Be given hormone replacement therapy (HRT) to treat symptoms of menopause. Follow these instructions at home: Alcohol use Do not drink alcohol if: Your health care provider tells you not to drink. You are pregnant, may be pregnant, or are planning to become pregnant. If you drink alcohol: Limit how much you have to: 0-1 drink a day. Know how much alcohol is in your drink. In the [place] one drink equals one 12 oz bottle of beer (355 mL), one 5 oz glass of wine (148 mL), or one 1 oz glass of hard liquor (44 mL). Lifestyle Do not use any products that contain nicotine or tobacco. These products include cigarettes, chewing tobacco, and vaping devices, such as e-cigarettes. If you need help quitting, ask your health care provider. Do not use street drugs. Do not share needles. Ask your health care provider for help if you need support or information about quitting drugs. General instructions Schedule regular health, dental, and eye exams. Stay current with your vaccines. Tell your health care provider if: You often feel depressed. You have ever been abused or do not feel safe at home. Summary Adopting a  healthy lifestyle and getting preventive care are important in promoting health and wellness. Follow your health care provider's instructions about healthy diet, exercising, and getting tested or screened for diseases. Follow your health care provider's instructions on monitoring your cholesterol and blood pressure. This information is not intended to replace advice given to you by your health care provider. Make sure you discuss any questions you have with your health care provider. Document Revised: 12/30/2020 Document Reviewed: 12/30/2020 Elsevier Patient Education  2024 [employer].

## 2023-03-24 NOTE — Progress Notes (Unsigned)
Subjective:    Patient ID: Jo Gomez, female    DOB: 1945-12-13, 76 y.o.   MRN: [id number]      HPI Jo Gomez is here for a Physical exam and her chronic medical problems.    Has seen neurology for balance/falls.  Has decreased sensation bilateral lower extremities-possibly related to back.  All week not feeling well - headache, PND and fatigued.  At this point she thinks it might be a viral sinus infection.  Some increase in anxiety and depression regarding her current balance issues and issues with her son.  Medications and allergies reviewed with patient and updated if appropriate.  Current Outpatient Medications on File Prior to Visit  Medication Sig Dispense Refill   cyanocobalamin (VITAMIN B12) 1000 MCG/ML injection Inject 1 mL (1,000 mcg total) into the skin every 30 (thirty) days. 3 mL 1   estrogen-methylTESTOSTERone (EST ESTROGENS-METHYLTEST HS) 0.625-1.25 MG tablet Take 1 tablet by mouth 3 (three) times a week. 36 tablet 3   fluticasone (FLOVENT HFA) 110 MCG/ACT inhaler Inhale 2 puffs into the lungs daily. Rinse mouth after each use. 12 g 2   levothyroxine (SYNTHROID) 75 MCG tablet TAKE 1 TABLET (75 MCG) BY MOUTH DAILY BEFORE BREAKFAST 90 tablet 3   Risankizumab-rzaa (SKYRIZI PEN) 150 MG/ML SOAJ Inject subcutaneously once every 3 months 1 mL 3   sertraline (ZOLOFT) 100 MG tablet TAKE 2 TABLETS BY MOUTH DAILY 180 tablet 3   traZODone (DESYREL) 150 MG tablet TAKE 1 TABLET BY MOUTH AT BEDTIME 90 tablet 3   TUBERCULIN SYR 1CC/25GX5/8" (SAFETY-LOK TB SYR 1CC/25GX5/8") 25G X 5/8" 1 ML MISC USE AS DIRECTED FOR B-12 INJECTIONS. 20 each 3   valACYclovir (VALTREX) 1000 MG tablet TAKE 2 TABLETS BY MOUTH EVERY 12 HOURS FOR 1 DAY AS NEEDED 20 tablet 3   Vitamin D, Ergocalciferol, (DRISDOL) 1.25 MG (50000 UNIT) CAPS capsule TAKE 1 CAPSULE BY MOUTH EVERY OTHER WEEK 6 capsule 3   No current facility-administered medications on file prior to visit.    Review of Systems   Constitutional:  Positive for fever (subjective fever last night).  HENT:  Positive for congestion, postnasal drip and sinus pressure (mild). Negative for sore throat.   Eyes:  Negative for visual disturbance.  Respiratory:  Positive for cough (related to PND). Negative for shortness of breath and wheezing.   Cardiovascular:  Negative for chest pain, palpitations and leg swelling.  Gastrointestinal:  Positive for nausea. Negative for abdominal pain, blood in stool, constipation and diarrhea.       No gerd  Genitourinary:  Negative for dysuria.  Musculoskeletal:  Positive for arthralgias (hands) and back pain (right lower back pain with pain down right leg).  Skin:  Negative for rash.  Neurological:  Positive for headaches (daily for last week). Negative for dizziness.  Psychiatric/Behavioral:  Positive for dysphoric mood (controlled). The patient is nervous/anxious.        Objective:   Vitals:   03/25/23 1059  BP: 122/68  Pulse: 70  Temp: 98.2 F (36.8 C)  SpO2: 98%   Filed Weights   03/25/23 1059  Weight: 114 lb 9.6 oz (52 kg)   Body mass index is 22.38 kg/m.  BP Readings from Last 3 Encounters:  03/25/23 122/68  03/24/23 128/84  03/18/23 128/84    Wt Readings from Last 3 Encounters:  03/25/23 114 lb 9.6 oz (52 kg)  03/24/23 116 lb (52.6 kg)  03/18/23 115 lb 12.8 oz (52.5 kg)  Physical Exam Constitutional: She appears well-developed and well-nourished. No distress.  HENT:  Head: Normocephalic and atraumatic.  Right Ear: External ear normal. Normal ear canal and TM Left Ear: External ear normal.  Normal ear canal and TM Mouth/Throat: Oropharynx is clear and moist.  Eyes: Conjunctivae normal.  Neck: Neck supple. No tracheal deviation present. No thyromegaly present.  No carotid bruit  Cardiovascular: Normal rate, regular rhythm and normal heart sounds.   No murmur heard.  No edema. Pulmonary/Chest: Effort normal and breath sounds normal. No respiratory  distress. She has no wheezes. She has no rales.  Breast: deferred   Abdominal: Soft. She exhibits no distension. There is no tenderness.  Lymphadenopathy: She has no cervical adenopathy.  Skin: Skin is warm and dry. She is not diaphoretic.  Psychiatric: She has a normal mood and affect. Her behavior is normal.     Lab Results  Component Value Date   WBC 6.5 02/18/2022   HGB 13.5 02/18/2022   HCT 39.1 02/18/2022   PLT 253.0 02/18/2022   GLUCOSE 89 02/18/2022   CHOL 173 02/18/2022   TRIG 62.0 02/18/2022   HDL 67.40 02/18/2022   LDLDIRECT 114.9 08/07/2013   LDLCALC 93 02/18/2022   ALT 21 02/18/2022   AST 24 02/18/2022   NA 132 (L) 02/18/2022   K 4.3 02/18/2022   CL 98 02/18/2022   CREATININE 0.76 02/18/2022   BUN 12 02/18/2022   CO2 28 02/18/2022   TSH 1.68 02/18/2022   HGBA1C 5.2 03/18/2023         Assessment & Plan:   Physical exam: Screening blood work  ordered Exercise referred to PT by neurology-stressed regular exercise Weight normal Substance abuse  none   Reviewed recommended immunizations.   Health Maintenance  Topic Date Due   COVID-19 Vaccine (7 - 2023-24 season) 09/01/2022   INFLUENZA VACCINE  03/25/2023   Zoster Vaccines- Shingrix (1 of 2) 06/25/2023 (Originally 02/24/1965)   Medicare Annual Wellness (AWV)  03/23/2024   DEXA SCAN  02/02/2025   Colonoscopy  09/03/2026   DTaP/Tdap/Td (4 - Td or Tdap) 08/05/2030   Pneumonia Vaccine 57+ Years old  Completed   Hepatitis C Screening  Completed   HPV VACCINES  Aged Out          See Problem List for Assessment and Plan of chronic medical problems.

## 2023-03-24 NOTE — Progress Notes (Signed)
Subjective:   Jo Gomez is a 77 y.o. female who presents for Medicare Annual (Subsequent) preventive examination.  Visit Complete: Virtual  I connected with  [name] on 03/24/23 by a audio enabled telemedicine application and verified that I am speaking with the correct person using two identifiers.  Patient Location: Home  Provider Location: Office/Clinic  I discussed the limitations of evaluation and management by telemedicine. The patient expressed understanding and agreed to proceed.  Patient Medicare AWV questionnaire was completed by the patient on 03/22/2023; I have confirmed that all information answered by patient is correct and no changes since this date.  Vital signs were taken from office visit dated 03/18/2023.  Vital Signs: Per patient no change in vitals since last visit.  Review of Systems     Cardiac Risk Factors include: advanced age (>77men, >31 women);family history of premature cardiovascular disease     Objective:    Today's Vitals   03/24/23 1433 03/24/23 1442  BP: 128/84   Weight: 116 lb (52.6 kg)   Height: 5' (1.524 m)   PainSc: 6  6   PainLoc: Back    Body mass index is 22.65 kg/m.     03/24/2023    2:35 PM 03/18/2023    2:49 PM 01/01/2023    7:56 PM 04/17/2022   11:44 AM 04/17/2021    9:03 AM 02/06/2020    9:55 AM 05/13/2016    1:50 PM  Advanced Directives  Does Patient Have a Medical Advance Directive? Yes Yes Yes Yes Yes Yes Yes  Type of Estate agent of [place];Living will Living will;Healthcare Power of Attorney Living will Healthcare Power of [place];Living will Living will;Healthcare Power of Attorney Living will Living will  Does patient want to make changes to medical advance directive?    No - Patient declined No - Patient declined No - Patient declined   Copy of Healthcare Power of Attorney in Chart? No - copy requested   No - copy requested No - copy requested      Current Medications  (verified) Outpatient Encounter Medications as of 03/24/2023  Medication Sig   alendronate (FOSAMAX) 70 MG tablet Take 1 tablet (70 mg total) by mouth every 7 (seven) days. Take first thing in am with 6 oz. Water.  Be upright after taking.  Eat nothing for one hour.   cyanocobalamin (VITAMIN B12) 1000 MCG/ML injection Inject 1 mL (1,000 mcg total) into the skin every 30 (thirty) days.   estrogen-methylTESTOSTERone (EST ESTROGENS-METHYLTEST HS) 0.625-1.25 MG tablet Take 1 tablet by mouth 3 (three) times a week.   fluticasone (FLOVENT HFA) 110 MCG/ACT inhaler Inhale 2 puffs into the lungs daily. Rinse mouth after each use.   levothyroxine (SYNTHROID) 75 MCG tablet TAKE 1 TABLET (75 MCG) BY MOUTH DAILY BEFORE BREAKFAST   Risankizumab-rzaa (SKYRIZI PEN) 150 MG/ML SOAJ Inject subcutaneously once every 3 months   sertraline (ZOLOFT) 100 MG tablet TAKE 2 TABLETS BY MOUTH DAILY   traZODone (DESYREL) 150 MG tablet TAKE 1 TABLET BY MOUTH AT BEDTIME   TUBERCULIN SYR 1CC/25GX5/8" (SAFETY-LOK TB SYR 1CC/25GX5/8") 25G X 5/8" 1 ML MISC USE AS DIRECTED FOR B-12 INJECTIONS.   valACYclovir (VALTREX) 1000 MG tablet TAKE 2 TABLETS BY MOUTH EVERY 12 HOURS FOR 1 DAY AS NEEDED   Vitamin D, Ergocalciferol, (DRISDOL) 1.25 MG (50000 UNIT) CAPS capsule TAKE 1 CAPSULE BY MOUTH EVERY OTHER WEEK   No facility-administered encounter medications on file as of 03/24/2023.    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 Venetia Maxon   THYROIDECTOMY  2000   TOTAL ABDOMINAL HYSTERECTOMY W/ BILATERAL SALPINGOOPHORECTOMY  1999   UPPER GASTROINTESTINAL ENDOSCOPY     Family History  Problem Relation Age of Onset   Heart attack Mother    Osteoporosis Mother    Lung cancer Mother    Colon polyps Mother    Arthritis Mother    COPD Father        chronic lung infections   Colon polyps Father    Colon cancer Neg Hx    Esophageal cancer Neg Hx    Stomach cancer Neg Hx    Rectal cancer Neg Hx    Breast cancer Neg Hx    Social History   Socioeconomic History   Marital status: Married    Spouse name: Britta Mccreedy   Number of children: 1   Years of education: Not on file   Highest education level: Master's degree (e.g., MA, MS, MEng, MEd, MSW, MBA)  Occupational History    Employer: RETIRED  Tobacco Use   Smoking status: Former    Current packs/day: 0.00    Average packs/day: 0.5 packs/day for 3.0 years (1.5 ttl pk-yrs)    Types: Cigarettes    Start date: 08/24/1965    Quit date: 08/24/1968    Years since quitting: 54.6   Smokeless tobacco: Never   Tobacco comments:    in college  Vaping Use   Vaping status: Never Used  Substance and Sexual Activity   Alcohol use: Yes    Alcohol/week: 2.0 standard drinks of alcohol    Types: 2 Standard drinks or equivalent per week    Comment: 2 glasses a week   Drug use: No   Sexual activity: Yes    Partners: Female    Birth control/protection: Surgical    Comment: TAH/BSO  Other Topics Concern   Not on file  Social History Narrative   Patient is right-handed. She lives with her wife in a split level house. She occasionally drinks coffee.   Are you right handed or left handed?    Are you currently employed ?    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 wife   What type of home do you live in: 1 story or 2 story? one       Social Determinants of Health   Financial Resource Strain: Low Risk  (03/24/2023)   Overall Financial Resource Strain (CARDIA)    Difficulty of Paying Living Expenses: Not hard at  all  Food Insecurity: No Food Insecurity (03/24/2023)   Hunger Vital Sign    Worried About Running Out of Food in the Last Year: Never true    Ran Out of Food in the Last Year: Never true  Transportation Needs: No Transportation Needs (03/24/2023)   PRAPARE - Administrator, Civil Service (Medical): No    Lack of Transportation (Non-Medical): No  Physical Activity: Insufficiently Active (03/24/2023)   Exercise Vital Sign    Days of Exercise per Week: 1 day    Minutes of Exercise per Session: 120 min  Stress: No Stress Concern Present (03/24/2023)   [employer] of Occupational Health - Occupational Stress Questionnaire    Feeling of Stress : Only a little  Social Connections: Moderately Isolated (03/24/2023)   Social Connection and Isolation Panel [NHANES]    Frequency of Communication with Friends and Family: Once a week    Frequency of Social Gatherings with Friends and Family: Once a week    Attends Religious Services: Never    Database administrator or Organizations: Yes    Attends Engineer, structural: More than 4 times per year    Marital Status: Married    Tobacco Counseling Counseling given: Not Answered Tobacco comments: in college   Clinical Intake:  Pre-visit preparation completed: Yes  Pain : 0-10 Pain Score: 6  Pain Type: Chronic pain, Neuropathic pain Pain Location: Back Pain Orientation: Lower Pain Radiating Towards: lower extremity causing gait issues Pain Descriptors / Indicators: Radiating Pain Onset: More than a month ago Pain Frequency: Constant     Nutritional Status: BMI of 19-24  Normal Nutritional  Risks: None Diabetes: No  How often do you need to have someone help you when you read instructions, pamphlets, or other written materials from your doctor or pharmacy?: 1 - Never What is the last grade level you completed in school?: [employer]; Retired Pharmacist, 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   Activities of Daily Living    03/24/2023    2:40 PM 03/22/2023   11:39 AM  In your present state of health, do you have any difficulty performing the following activities:  Hearing? 0 0  Vision? 0 0  Difficulty concentrating or making decisions? 0 0  Walking or climbing stairs? 1 1  Dressing or bathing? 0 0  Doing errands, shopping? 0 0  Preparing Food and eating ? N N  Using the Toilet? N N  In the past six months, have you accidently leaked urine? Y Y  Do you have problems with loss of bowel control? Y Y  Managing your Medications? N N  Managing your Finances? N N  Housekeeping or managing your Housekeeping? N N    Patient Care Team: Pincus Sanes, MD as PCP - General (Internal Medicine) [facility] Jerene Bears, MD as Consulting Physician (Gynecology) Glyn Ade, PA-C as Physician Assistant (Dermatology) Sallye Lat, MD as Consulting Physician (Ophthalmology) Janalyn Harder, MD (Inactive) as Consulting Physician (Dermatology)  Indicate any recent Medical Services you may have received from other than [facility] providers in the past year (date may be approximate).     Assessment:   This is a routine wellness examination for [place].  Hearing/Vision screen Hearing Screening - Comments:: Some hearing difficulties. No hearing aids.   Vision Screening - Comments:: Wears rx glasses - up to date with routine eye exams with Marchelle Gearing, MD.   Dietary issues and  exercise activities discussed:     Goals Addressed   None   Depression Screen    03/24/2023    2:35 PM 03/18/2023    9:46 AM 02/05/2023     9:28 AM 04/17/2022   11:45 AM 03/26/2022    3:58 PM 02/16/2022   11:16 AM 10/06/2021    2:04 PM  PHQ 2/9 Scores  PHQ - 2 Score 0 0 0 0 0 0 0  PHQ- 9 Score 0    2 2     Fall Risk    03/24/2023    2:46 PM 03/22/2023   11:39 AM 03/18/2023    2:49 PM 02/05/2023    9:27 AM 03/26/2022    3:59 PM  Fall Risk   Falls in the past year? 1 1 1 1 1   Number falls in past yr: 1 1 1 1 1   Injury with Fall? 1 1 1 1  0  Risk for fall due to : History of fall(s);Impaired balance/gait;Orthopedic patient   Impaired balance/gait;History of fall(s) No Fall Risks  Follow up Education provided;Falls prevention discussed;Falls evaluation completed  Falls evaluation completed Falls evaluation completed Falls evaluation completed    MEDICARE RISK AT HOME:  Medicare Risk at Home - 03/24/23 1439     Any stairs in or around the home? No    If so, are there any without handrails? No    Home free of loose throw rugs in walkways, pet beds, electrical cords, etc? No    Adequate lighting in your home to reduce risk of falls? Yes    Life alert? No    Use of a cane, walker or w/c? No    Grab bars in the bathroom? Yes    Shower chair or bench in shower? Yes    Elevated toilet seat or a handicapped toilet? Yes             TIMED UP AND GO:  Was the test performed?  No    Cognitive Function:      04/06/2018   11:56 AM  Montreal Cognitive Assessment   Visuospatial/ Executive (0/5) 4  Naming (0/3) 3  Attention: Read list of digits (0/2) 2  Attention: Read list of letters (0/1) 1  Attention: Serial 7 subtraction starting at 100 (0/3) 1  Language: Repeat phrase (0/2) 2  Language : Fluency (0/1) 1  Abstraction (0/2) 2  Delayed Recall (0/5) 0  Orientation (0/6) 6  Total 22  Adjusted Score (based on education) 22      03/24/2023    2:39 PM 04/17/2022   11:46 AM 02/06/2020    9:57 AM  6CIT Screen  What Year? 0 points 0 points 0 points  What month? 0 points 0 points 0 points  What time? 0 points 0 points  0 points  Count back from 20 0 points 0 points 0 points  Months in reverse 0 points 0 points 0 points  Repeat phrase 0 points 0 points 0 points  Total Score 0 points 0 points 0 points    Immunizations Immunization History  Administered Date(s) Administered   Fluad Quad(high Dose 65+) 07/07/2022   Hepb-cpg 10/10/2021, 12/17/2021   Influenza Split 07/13/2011, 08/08/2012   Influenza Whole 04/21/2010   Influenza,inj,Quad PF,6+ Mos 05/26/2013, 06/25/2014   Influenza-Unspecified 06/25/2015, 05/20/2021, 06/07/2022   Moderna Sars-Covid-2 Vaccination 09/26/2019, 10/27/2019, 04/30/2020   PFIZER Comirnaty(Gray Top)Covid-19 Tri-Sucrose Vaccine 12/08/2020   Pfizer Covid-19 Vaccine Bivalent Booster 4yrs & up 05/12/2021, 07/07/2022   Pneumococcal Conjugate-13 06/25/2014  Pneumococcal Polysaccharide-23 11/23/1978, 08/24/1996, 03/25/2007, 02/01/2019   Td 08/24/2002, 08/05/2020   Tdap 11/30/2013    TDAP status: Up to date  Flu Vaccine status: Up to date  Pneumococcal vaccine status: Up to date  Covid-19 vaccine status: Completed vaccines  Qualifies for Shingles Vaccine? Yes   Zostavax completed No   Shingrix Completed?: No.    Education has been provided regarding the importance of this vaccine. Patient has been advised to call insurance company to determine out of pocket expense if they have not yet received this vaccine. Advised may also receive vaccine at local pharmacy or Health Dept. Verbalized acceptance and understanding.  Screening Tests Health Maintenance  Topic Date Due   Zoster Vaccines- Shingrix (1 of 2) Never done   COVID-19 Vaccine (7 - 2023-24 season) 09/01/2022   INFLUENZA VACCINE  03/25/2023   Medicare Annual Wellness (AWV)  03/23/2024   DEXA SCAN  02/02/2025   Colonoscopy  09/03/2026   DTaP/Tdap/Td (4 - Td or Tdap) 08/05/2030   Pneumonia Vaccine 2+ Years old  Completed   Hepatitis C Screening  Completed   HPV VACCINES  Aged Out    Health Maintenance  Health  Maintenance Due  Topic Date Due   Zoster Vaccines- Shingrix (1 of 2) Never done   COVID-19 Vaccine (7 - 2023-24 season) 09/01/2022    Colorectal cancer screening: Type of screening: Colonoscopy. Completed 09/03/2021. Repeat every 5 years  Mammogram status: Completed 07/10/2022. Repeat every year  Bone Density status: Completed 02/03/2023. Results reflect: Bone density results: OSTEOPENIA. Repeat every 2-3 years.  Lung Cancer Screening: (Low Dose CT Chest recommended if Age 8-80 years, 20 pack-year currently smoking OR have quit w/in 15years.) does not qualify.   Lung Cancer Screening Referral: no  Additional Screening:  Hepatitis C Screening: does qualify; Completed 09/16/2021  Vision Screening: Recommended annual ophthalmology exams for early detection of glaucoma and other disorders of the eye. Is the patient up to date with their annual eye exam?  Yes  Who is the provider or what is the name of the office in which the patient attends annual eye exams? Marchelle Gearing, MD. If pt is not established with a provider, would they like to be referred to a provider to establish care? No .   Dental Screening: Recommended annual dental exams for proper oral hygiene  Diabetic Foot Exam: N/A  Community Resource Referral / Chronic Care Management: CRR required this visit?  No   CCM required this visit?  No     Plan:     I have personally reviewed and noted the following in the patient's chart:   Medical and social history Use of alcohol, tobacco or illicit drugs  Current medications and supplements including opioid prescriptions. Patient is not currently taking opioid prescriptions. Functional ability and status Nutritional status Physical activity Advanced directives List of other physicians Hospitalizations, surgeries, and ER visits in previous 12 months Vitals Screenings to include cognitive, depression, and falls Referrals and appointments  In addition, I have reviewed and  discussed with patient certain preventive protocols, quality metrics, and best practice recommendations. A written personalized care plan for preventive services as well as general preventive health recommendations were provided to patient.     Mickeal Needy, LPN   1/61/0960   After Visit Summary: (Mail) Due to this being a telephonic visit, the after visit summary with patients personalized plan was offered to patient via mail   Nurse Notes: Normal cognitive status assessed by direct observation via telephone conversation  by this Nurse Health Advisor. No abnormalities found.

## 2023-03-24 NOTE — Patient Instructions (Addendum)
Jo Gomez , Thank you for taking time to come for your Medicare Wellness Visit. I appreciate your ongoing commitment to your health goals. Please review the following plan we discussed and let me know if I can assist you in the future.   Referrals/Orders/Follow-Ups/Clinician Recommendations: 03/25/2023 at 10:40 a.m for physical with Dr. Lawerance Bach.  This is a list of the screening recommended for you and due dates:  Health Maintenance  Topic Date Due   Zoster (Shingles) Vaccine (1 of 2) Never done   COVID-19 Vaccine (7 - 2023-24 season) 09/01/2022   Flu Shot  03/25/2023   Medicare Annual Wellness Visit  03/23/2024   DEXA scan (bone density measurement)  02/02/2025   Colon Cancer Screening  09/03/2026   DTaP/Tdap/Td vaccine (4 - Td or Tdap) 08/05/2030   Pneumonia Vaccine  Completed   Hepatitis C Screening  Completed   HPV Vaccine  Aged Out    Advanced directives: (Copy Requested) Please bring a copy of your health care power of attorney and living will to the office to be added to your chart at your convenience.  Next Medicare Annual Wellness Visit scheduled for next year: Yes; 03/27/2024 at 2:30 p.m. via telephone visit.  Preventive Care 35 Years and Older, Female Preventive care refers to lifestyle choices and visits with your health care provider that can promote health and wellness. What does preventive care include? A yearly physical exam. This is also called an annual well check. Dental exams once or twice a year. Routine eye exams. Ask your health care provider how often you should have your eyes checked. Personal lifestyle choices, including: Daily care of your teeth and gums. Regular physical activity. Eating a healthy diet. Avoiding tobacco and drug use. Limiting alcohol use. Practicing safe sex. Taking low-dose aspirin every day. Taking vitamin and mineral supplements as recommended by your health care provider. What happens during an annual well check? The services and  screenings done by your health care provider during your annual well check will depend on your age, overall health, lifestyle risk factors, and family history of disease. Counseling  Your health care provider may ask you questions about your: Alcohol use. Tobacco use. Drug use. Emotional well-being. Home and relationship well-being. Sexual activity. Eating habits. History of falls. Memory and ability to understand (cognition). Work and work Astronomer. Reproductive health. Screening  You may have the following tests or measurements: Height, weight, and BMI. Blood pressure. Lipid and cholesterol levels. These may be checked every 5 years, or more frequently if you are over 26 years old. Skin check. Lung cancer screening. You may have this screening every year starting at age 30 if you have a 30-pack-year history of smoking and currently smoke or have quit within the past 15 years. Fecal occult blood test (FOBT) of the stool. You may have this test every year starting at age 78. Flexible sigmoidoscopy or colonoscopy. You may have a sigmoidoscopy every 5 years or a colonoscopy every 10 years starting at age 38. Hepatitis C blood test. Hepatitis B blood test. Sexually transmitted disease (STD) testing. Diabetes screening. This is done by checking your blood sugar (glucose) after you have not eaten for a while (fasting). You may have this done every 1-3 years. Bone density scan. This is done to screen for osteoporosis. You may have this done starting at age 1. Mammogram. This may be done every 1-2 years. Talk to your health care provider about how often you should have regular mammograms. Talk with your health  care provider about your test results, treatment options, and if necessary, the need for more tests. Vaccines  Your health care provider may recommend certain vaccines, such as: Influenza vaccine. This is recommended every year. Tetanus, diphtheria, and acellular pertussis (Tdap,  Td) vaccine. You may need a Td booster every 10 years. Zoster vaccine. You may need this after age 73. Pneumococcal 13-valent conjugate (PCV13) vaccine. One dose is recommended after age 88. Pneumococcal polysaccharide (PPSV23) vaccine. One dose is recommended after age 65. Talk to your health care provider about which screenings and vaccines you need and how often you need them. This information is not intended to replace advice given to you by your health care provider. Make sure you discuss any questions you have with your health care provider. Document Released: 09/06/2015 Document Revised: 04/29/2016 Document Reviewed: 06/11/2015 Elsevier Interactive Patient Education  2017 [employer].  Fall Prevention in the Home Falls can cause injuries. They can happen to people of all ages. There are many things you can do to make your home safe and to help prevent falls. What can I do on the outside of my home? Regularly fix the edges of walkways and driveways and fix any cracks. Remove anything that might make you trip as you walk through a door, such as a raised step or threshold. Trim any bushes or trees on the path to your home. Use bright outdoor lighting. Clear any walking paths of anything that might make someone trip, such as rocks or tools. Regularly check to see if handrails are loose or broken. Make sure that both sides of any steps have handrails. Any raised decks and porches should have guardrails on the edges. Have any leaves, snow, or ice cleared regularly. Use sand or salt on walking paths during winter. Clean up any spills in your garage right away. This includes oil or grease spills. What can I do in the bathroom? Use night lights. Install grab bars by the toilet and in the tub and shower. Do not use towel bars as grab bars. Use non-skid mats or decals in the tub or shower. If you need to sit down in the shower, use a plastic, non-slip stool. Keep the floor dry. Clean up any  water that spills on the floor as soon as it happens. Remove soap buildup in the tub or shower regularly. Attach bath mats securely with double-sided non-slip rug tape. Do not have throw rugs and other things on the floor that can make you trip. What can I do in the bedroom? Use night lights. Make sure that you have a light by your bed that is easy to reach. Do not use any sheets or blankets that are too big for your bed. They should not hang down onto the floor. Have a firm chair that has side arms. You can use this for support while you get dressed. Do not have throw rugs and other things on the floor that can make you trip. What can I do in the kitchen? Clean up any spills right away. Avoid walking on wet floors. Keep items that you use a lot in easy-to-reach places. If you need to reach something above you, use a strong step stool that has a grab bar. Keep electrical cords out of the way. Do not use floor polish or wax that makes floors slippery. If you must use wax, use non-skid floor wax. Do not have throw rugs and other things on the floor that can make you trip. What can  I do with my stairs? Do not leave any items on the stairs. Make sure that there are handrails on both sides of the stairs and use them. Fix handrails that are broken or loose. Make sure that handrails are as long as the stairways. Check any carpeting to make sure that it is firmly attached to the stairs. Fix any carpet that is loose or worn. Avoid having throw rugs at the top or bottom of the stairs. If you do have throw rugs, attach them to the floor with carpet tape. Make sure that you have a light switch at the top of the stairs and the bottom of the stairs. If you do not have them, ask someone to add them for you. What else can I do to help prevent falls? Wear shoes that: Do not have high heels. Have rubber bottoms. Are comfortable and fit you well. Are closed at the toe. Do not wear sandals. If you use a  stepladder: Make sure that it is fully opened. Do not climb a closed stepladder. Make sure that both sides of the stepladder are locked into place. Ask someone to hold it for you, if possible. Clearly mark and make sure that you can see: Any grab bars or handrails. First and last steps. Where the edge of each step is. Use tools that help you move around (mobility aids) if they are needed. These include: Canes. Walkers. Scooters. Crutches. Turn on the lights when you go into a dark area. Replace any light bulbs as soon as they burn out. Set up your furniture so you have a clear path. Avoid moving your furniture around. If any of your floors are uneven, fix them. If there are any pets around you, be aware of where they are. Review your medicines with your doctor. Some medicines can make you feel dizzy. This can increase your chance of falling. Ask your doctor what other things that you can do to help prevent falls. This information is not intended to replace advice given to you by your health care provider. Make sure you discuss any questions you have with your health care provider. Document Released: 06/06/2009 Document Revised: 01/16/2016 Document Reviewed: 09/14/2014 Elsevier Interactive Patient Education  2017 [employer].

## 2023-03-25 ENCOUNTER — Ambulatory Visit (INDEPENDENT_AMBULATORY_CARE_PROVIDER_SITE_OTHER): Payer: Medicare PPO | Admitting: Internal Medicine

## 2023-03-25 VITALS — BP 122/68 | HR 70 | Temp 98.2°F | Ht 60.0 in | Wt 114.6 lb

## 2023-03-25 DIAGNOSIS — G4709 Other insomnia: Secondary | ICD-10-CM

## 2023-03-25 DIAGNOSIS — M8580 Other specified disorders of bone density and structure, unspecified site: Secondary | ICD-10-CM

## 2023-03-25 DIAGNOSIS — M810 Age-related osteoporosis without current pathological fracture: Secondary | ICD-10-CM

## 2023-03-25 DIAGNOSIS — Z0000 Encounter for general adult medical examination without abnormal findings: Secondary | ICD-10-CM

## 2023-03-25 DIAGNOSIS — F3289 Other specified depressive episodes: Secondary | ICD-10-CM | POA: Diagnosis not present

## 2023-03-25 DIAGNOSIS — E538 Deficiency of other specified B group vitamins: Secondary | ICD-10-CM

## 2023-03-25 DIAGNOSIS — Z0001 Encounter for general adult medical examination with abnormal findings: Secondary | ICD-10-CM | POA: Diagnosis not present

## 2023-03-25 DIAGNOSIS — I6523 Occlusion and stenosis of bilateral carotid arteries: Secondary | ICD-10-CM | POA: Diagnosis not present

## 2023-03-25 DIAGNOSIS — F419 Anxiety disorder, unspecified: Secondary | ICD-10-CM | POA: Diagnosis not present

## 2023-03-25 DIAGNOSIS — E890 Postprocedural hypothyroidism: Secondary | ICD-10-CM

## 2023-03-25 NOTE — Assessment & Plan Note (Signed)
Chronic  Clinically euthyroid Check tsh and will titrate med dose if needed Currently taking levothyroxine 75 mcg daily  

## 2023-03-25 NOTE — Assessment & Plan Note (Signed)
Having increased anxiety Some of this is related to her current balance issues and falls, but some is related to her send situation Continue sertraline 200 mg daily Referral ordered to therapy

## 2023-03-25 NOTE — Assessment & Plan Note (Signed)
New since last visit Mild stenosis left internal carotid artery, no stenosis right internal carotid artery Check lipids

## 2023-03-25 NOTE — Assessment & Plan Note (Signed)
Chronic Monitored by GYN DEXA up-to-date On Fosamax 70 mg weekly Continue as much activity/exercise as possible Continue calcium and vitamin D daily

## 2023-03-25 NOTE — Assessment & Plan Note (Signed)
Chronic Controlled, stable Continue trazodone 150 mg HS Feels like she is getting a good amount of sleep and good quality sleep.

## 2023-03-25 NOTE — Assessment & Plan Note (Addendum)
Chronic Fairly controlled Continue sertraline 200 mg daily

## 2023-03-25 NOTE — Assessment & Plan Note (Signed)
Chronic Recent level elevated Continue supplementation

## 2023-03-26 ENCOUNTER — Ambulatory Visit: Payer: Medicare PPO | Admitting: Neurology

## 2023-03-30 ENCOUNTER — Encounter (HOSPITAL_BASED_OUTPATIENT_CLINIC_OR_DEPARTMENT_OTHER): Payer: Self-pay | Admitting: Obstetrics & Gynecology

## 2023-03-30 ENCOUNTER — Telehealth: Payer: Medicare PPO | Admitting: Internal Medicine

## 2023-03-31 ENCOUNTER — Other Ambulatory Visit (HOSPITAL_BASED_OUTPATIENT_CLINIC_OR_DEPARTMENT_OTHER): Payer: Self-pay | Admitting: *Deleted

## 2023-03-31 ENCOUNTER — Other Ambulatory Visit (HOSPITAL_COMMUNITY): Payer: Self-pay

## 2023-03-31 DIAGNOSIS — E559 Vitamin D deficiency, unspecified: Secondary | ICD-10-CM

## 2023-03-31 MED ORDER — VITAMIN D (ERGOCALCIFEROL) 1.25 MG (50000 UNIT) PO CAPS
ORAL_CAPSULE | ORAL | 0 refills | Status: AC
Start: 2023-03-31 — End: ?
  Filled 2023-03-31: qty 6, 84d supply, fill #0

## 2023-04-01 ENCOUNTER — Other Ambulatory Visit (HOSPITAL_COMMUNITY): Payer: Self-pay

## 2023-04-01 ENCOUNTER — Encounter: Payer: Self-pay | Admitting: Internal Medicine

## 2023-04-01 MED ORDER — AMOXICILLIN-POT CLAVULANATE 875-125 MG PO TABS
1.0000 | ORAL_TABLET | Freq: Two times a day (BID) | ORAL | 0 refills | Status: AC
  Filled 2023-04-01: qty 28, 14d supply, fill #0

## 2023-04-02 ENCOUNTER — Other Ambulatory Visit: Payer: Self-pay

## 2023-04-02 ENCOUNTER — Other Ambulatory Visit (HOSPITAL_COMMUNITY): Payer: Self-pay

## 2023-04-02 ENCOUNTER — Ambulatory Visit: Payer: Medicare PPO | Attending: Neurology | Admitting: Physical Therapy

## 2023-04-02 DIAGNOSIS — M6281 Muscle weakness (generalized): Secondary | ICD-10-CM | POA: Diagnosis not present

## 2023-04-02 DIAGNOSIS — R2681 Unsteadiness on feet: Secondary | ICD-10-CM | POA: Insufficient documentation

## 2023-04-02 DIAGNOSIS — R296 Repeated falls: Secondary | ICD-10-CM | POA: Insufficient documentation

## 2023-04-02 DIAGNOSIS — R2689 Other abnormalities of gait and mobility: Secondary | ICD-10-CM | POA: Insufficient documentation

## 2023-04-02 NOTE — Therapy (Unsigned)
OUTPATIENT PHYSICAL THERAPY THORACOLUMBAR EVALUATION  Patient Name: Jo Gomez MRN: [id number] DOB:1946/02/17, 76 y.o., female Today's Date: 04/02/2023    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   Asthma    Depression    GERD (gastroesophageal reflux disease)    diet controlled, no med   History of endoscopy    HSV infection    IgA deficiency (HCC)    Low vitamin B12 level    Osteoporosis    Ovarian cyst    TAH/BSO   Plaque psoriasis    Shingles    Thyroid dysfunction    Past Surgical History:  Procedure Laterality Date   broken arm     surgery for this   BROW LIFT  08/2013   COLONOSCOPY     LUMBAR DISC SURGERY  10/2020   L-4, L-5, S-1, Dr. Venetia Maxon   THYROIDECTOMY  2000   TOTAL ABDOMINAL HYSTERECTOMY W/ BILATERAL SALPINGOOPHORECTOMY  1999   UPPER GASTROINTESTINAL ENDOSCOPY     Patient Active Problem List   Diagnosis Date Noted   Anxiety 03/25/2023   Carotid artery disease (HCC) 02/23/2023   Lightheadedness 02/05/2023   Poor balance 02/05/2023   Frequent falls 02/05/2023   Palpitations 02/05/2023   Skin tear of lower leg without complication, left, initial encounter 01/08/2023   Arthropathy of lumbar facet joint 02/16/2022   Mild persistent asthma without complication 07/07/2021   Bilateral hand pain 05/14/2021   Myalgia 04/17/2021   Polyarthralgia 04/17/2021   Heartburn 03/31/2021   Psoriasis 02/12/2021   Bilateral lumbar radiculopathy 01/15/2021   Elevated LFTs 09/10/2020   Chronic bronchitis (HCC) 02/01/2019   Migraine headache 01/31/2019   Cardiac murmur 09/29/2016   Pain of both shoulder joints 06/24/2016   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   Insomnia 09/16/2015   Low back pain radiating to left leg 06/25/2014   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   Brow ptosis 07/13/2012   Vitamin B12 deficiency 03/05/2010   IgA deficiency (HCC)  10/18/2009   Hypothyroidism 09/05/2007   Depression 10/21/2006   Osteopenia 10/21/2006    PCP: Pincus Sanes, MD  REFERRING PROVIDER: Antony Madura, MD  THERAPY DIAG:  No diagnosis found.  REFERRING DIAG: Imbalance [R26.89], Falls frequently [R29.6]   Rationale for Evaluation and Treatment:  Rehabilitation  SUBJECTIVE:  PERTINENT PAST HISTORY:  Osteopenia, low back surgery (L5/S1), migraine, anxiety        PRECAUTIONS: Fall  WEIGHT BEARING RESTRICTIONS No  FALLS:  Has patient fallen in last 6 months? Yes, Number of falls: 15 (up to 2-3x/week)  MOI/History of condition:  Onset date: ~ 6 months  SUBJECTIVE STATEMENT  Jo Gomez is a 77 y.o. female who presents to clinic with chief complaint of frequent falls.  She feels like she doesn't pick up her feel very well. She will catch her her feet and toes and fall, particularly while doing yardwork.  It is even worse when closing her eyes.  She had one instance of hitting her head in a fall but otherwise has had no significant injury.  She denies dizziness, She denies blacking out or LOC.  She is scheduled  for an EMG on Monday.  From referring provider: "Patient had low back surgery (L5-S1) about 3 years ago (at [facility]). She had extreme back pain radiating into her legs. Surgery helped with her symptoms. Her back  pain returned in severity and she has been getting injections (at least 3 since). She had an intraspinal injection about 1.5 months ago. This has really helped with pain.    She has started to have imbalance and falls over the last couple of months. She will wake up and feel like she does not know where her feet are. Her balance will be off. It will come and go. She endorses falls as well. She will fall 3-4 times per week, particularly when out in her yard. She will feel disoriented and off balance then lower herself to the ground. She will feel like she is not picking up her feet and will  trip. She has significant difficulty with balance when closing her eyes in the shower.  ASSESSMENT: Jo Gomez is a 77 y.o. female who presents for evaluation of imbalance and falls. She has a relevant medical history of hypothyroidism, insomnia, depression, anxiety, OA, osteoporosis, asthma, B12 deficiency. Her neurological examination is pertinent for diminished sensation to vibration and proprioception in distal lower extremities and positive Romberg. 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 Patient's symptoms of imbalance and falls are likely due to poor sensation in bilateral distal lower extremities. 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 I will get labs to look for treatable causes and get an EMG to further clarify. I will also send patient to PT to work on balance."   Red flags:  denies  Pain:  CC is balance not pain  Occupation: na  Assistive Device: none used  Hand Dominance: NA  Patient Goals/Specific Activities: building strength, endurance, and balance   OBJECTIVE:   DIAGNOSTIC FINDINGS:  ***  GENERAL OBSERVATION/GAIT:  ***  SENSATION:  Light touch: Deficits Bil lower legs    LE MMT:  MMT Right (Eval) Left (Eval)  Hip flexion (L2, L3) 3+ 3+  Knee extension (L3) 4 3+  Knee flexion 4 4  Hip abduction 3 3+  Hip extension    Hip external rotation    Hip internal rotation    Hip adduction    Ankle dorsiflexion (L4)    Ankle plantarflexion (S1) Unable SL heel raise Unable s/l heel raise  Ankle inversion    Ankle eversion    Great Toe ext (L5)    Grossly     (Blank rows = not tested, score listed is out of 5 possible points.  N = WNL, D = diminished, C = clear for gross weakness with myotome testing, * = concordant pain  with testing)    LE ROM:  ROM Right (Eval) Left (Eval)  Hip flexion    Hip extension    Hip abduction    Hip adduction    Hip internal rotation    Hip external rotation    Knee extension    Knee flexion    Ankle dorsiflexion    Ankle plantarflexion    Ankle inversion    Ankle eversion     (Blank rows = not tested, N = WNL, * = concordant pain with testing)  Functional Tests  Eval (04/02/2023)    30'' STS: 8x  UE used? N    10 m max gait speed: 12'', .83 m/s, AD: none    Progressive balance screen (highest level completed for >/= 10''):  Feet together: 10'' Semi  Tandem: R in rear 10'', L in rear 10'' EC <3'' bil Tandem: R in rear unable'', L in rear unable''    2 MWT: 388'    TUG: 10                                          PATIENT SURVEYS:  FOTO ***   TODAY'S TREATMENT  Creating, reviewing, and completing below HEP  PATIENT EDUCATION:  POC, diagnosis, prognosis, HEP, and outcome measures.  Pt educated via explanation, demonstration, and handout (HEP).  Pt confirms understanding verbally.   HOME EXERCISE PROGRAM: Access Code: [email or web address] URL: [email or web address] Date: 04/02/2023 Prepared by: Alphonzo Severance  Exercises - Sit to Stand with Counter Support  - 1 x daily - 7 x weekly - 3 sets - 10 reps - Semi-Tandem Balance at [employer] Eyes Open  - 2 x daily - 7 x weekly - 1 sets - 3 reps - 1 min hold - Heel Toe Raises with Counter Support  - 1 x daily - 7 x weekly - 3 sets - 15 - 20 reps  Treatment priorities   Eval                                                  ASSESSMENT:  CLINICAL IMPRESSION: Soyla is a 77 y.o. female who presents to clinic with signs and sxs consistent with ***.    OBJECTIVE IMPAIRMENTS: ***  ACTIVITY LIMITATIONS: ***  PERSONAL FACTORS: See medical history and pertinent history   REHAB POTENTIAL: {rehabpotential:25112}  CLINICAL DECISION MAKING: {clinical decision  making:25114}  EVALUATION COMPLEXITY: {Evaluation complexity:25115}   GOALS:   SHORT TERM GOALS: Target date: 04/30/2023   Ellasyn will be >75% HEP compliant to improve carryover between sessions and facilitate independent management of condition  Evaluation: ongoing Goal status: INITIAL   LONG TERM GOALS: Target date: 05/28/2023   Zitlalic will improve FOTO score to *** as a proxy for functional improvement  Evaluation/Baseline: *** Goal status: INITIAL    2.  Conswello will be able to stand for >30'' in tandem stance, to show a significant improvement in balance in order to reduce fall risk   Evaluation/Baseline: unable tandem bil Goal status: INITIAL   3.  Carl will improve 30'' STS (MCID 2) to >/= 12x (w/ UE?: n) to show improved LE strength and improved transfers   Evaluation/Baseline: 8x  w/ UE? n Goal status: INITIAL   4.  Cylinda will improve 10 meter max gait speed to 1 m/s (.1 m/s MCID) to show functional improvement in ambulation   Evaluation/Baseline: .8 m/s Goal status: INITIAL   Norms:     5.  ***   6.  ***   PLAN: PT FREQUENCY: 1-2x/week  PT DURATION: 8 weeks  PLANNED INTERVENTIONS: Therapeutic exercises, Aquatic therapy, Therapeutic activity, Neuro Muscular re-education, Gait training, Patient/Family education, Joint mobilization, Dry Needling, Electrical stimulation, Spinal mobilization and/or manipulation, Moist heat, Taping, Vasopneumatic device, Ionotophoresis 4mg /ml Dexamethasone, and Manual therapy   Alphonzo Severance PT, DPT 04/02/2023, 10:24 AM

## 2023-04-05 ENCOUNTER — Telehealth: Payer: Self-pay | Admitting: Neurology

## 2023-04-05 ENCOUNTER — Ambulatory Visit: Payer: Medicare PPO | Admitting: Neurology

## 2023-04-05 ENCOUNTER — Encounter: Payer: Self-pay | Admitting: Neurology

## 2023-04-05 DIAGNOSIS — R296 Repeated falls: Secondary | ICD-10-CM

## 2023-04-05 DIAGNOSIS — M5412 Radiculopathy, cervical region: Secondary | ICD-10-CM

## 2023-04-05 DIAGNOSIS — R202 Paresthesia of skin: Secondary | ICD-10-CM

## 2023-04-05 DIAGNOSIS — R200 Anesthesia of skin: Secondary | ICD-10-CM

## 2023-04-05 DIAGNOSIS — R2689 Other abnormalities of gait and mobility: Secondary | ICD-10-CM

## 2023-04-05 DIAGNOSIS — M5417 Radiculopathy, lumbosacral region: Secondary | ICD-10-CM | POA: Diagnosis not present

## 2023-04-05 DIAGNOSIS — Z131 Encounter for screening for diabetes mellitus: Secondary | ICD-10-CM

## 2023-04-05 NOTE — Procedures (Addendum)
[facility]  [address] [place], [address]  [place], [place] [address] Tel: [phone] Fax: [phone] Test Date:  04/05/2023  Patient: Jo Gomez DOB: 29-Jul-1946 Physician: Jacquelyne Balint, MD  Sex: Female Height: 5\' 0"  Ref Phys: Jacquelyne Balint, MD  ID#: [id number]   Technician:    History: This is a 77 year old female with imbalance and falls.  NCV & EMG Findings: Extensive electrodiagnostic evaluation of the right upper and lower limbs shows: Right sural, superficial peroneal/fibular, median, ulnar, and radial sensory responses are within normal limits. Right peroneal/fibular (EDB), tibial (AH), median (APB), and ulnar (ADM) motor responses are within normal limits. Right H reflex latency is within normal limits. Chronic motor axon loss changes without accompanying active denervation changes are seen in the right medial head of gastrocnemius, short head of biceps femoris, first dorsal interosseous, extensor indicis proprius, and cervical paraspinal (C7 level) muscles. Lumbosacral paraspinal muscles were deferred due to prior surgery.  Impression: This is an abnormal study. 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 No electrodiagnostic evidence of a large fiber sensorimotor neuropathy. No electrodiagnostic evidence of a right median mononeuropathy at or distal to the wrist (ie: carpal tunnel syndrome).    ___________________________ Jacquelyne Balint, MD    Nerve Conduction Studies Motor Nerve Results    Latency Amplitude F-Lat Segment Distance CV Comment  Site (ms) Norm (mV) Norm (ms)  (cm) (m/s) Norm   Right Fibular (EDB) Motor  Ankle 4.3  < 6.0 4.7  > 2.5        Bel fib head 10.2 - 4.7  -  Bel fib head-Ankle 25 42  > 40   Pop fossa 12.0 - 4.6 -  Pop fossa-Bel fib head 7.5 42 -   Right Median (APB) Motor  Wrist 2.6  < 4.0 5.8  > 5.0        Elbow 6.9 - 5.4 -  Elbow-Wrist 25 58  > 50   Right Tibial (AH) Motor  Ankle 3.5  < 6.0 5.8  > 4.0        Knee 10.3 - 4.4 -  Knee-Ankle 34 50  > 40   Right Ulnar (ADM) Motor  Wrist 1.75  < 3.1 11.7  > 7.0        Bel elbow 4.9 - 11.0 -  Bel elbow-Wrist 18.5 58  > 50   Ab elbow 6.7 - 9.6 -  Ab elbow-Bel elbow 10 56 -    Sensory Sites    Neg Peak Lat Amplitude (O-P) Segment Distance Velocity Comment  Site (ms) Norm (V) Norm  (cm) (ms)   Right Median Sensory  Wrist-Dig II 2.9  < 3.8 14  > 10 Wrist-Dig II 13    Right Radial Sensory  Forearm-Wrist 2.3  < 2.8 16  > 10 Forearm-Wrist 10    Right Superficial Fibular Sensory  14 cm-Ankle 2.7  < 4.6 8  > 3 14 cm-Ankle 14    Right Sural Sensory  Calf-Lat mall 3.3  < 4.6 6  > 3 Calf-Lat mall 14    Right Ulnar Sensory  Wrist-Dig V 2.9  < 3.2 14  > 5 Wrist-Dig V 11     H-Reflex Results    M-Lat H  Lat H Neg Amp H-M Lat  Site (ms) (ms) Norm (mV) (ms)  Right Tibial H-Reflex  Pop fossa 5.2 32.0  < 35.0 1.54 26.8   Electromyography   Side Muscle Ins.Act Fibs Fasc Recrt Amp Dur Poly Activation Comment  Right Tib ant Nml Nml Nml Nml Nml Nml Nml Nml N/A  Right Gastroc MH Nml Nml Nml *1- *1+ *1+ *1+ Nml N/A  Right Rectus fem Nml Nml Nml Nml Nml Nml Nml Nml N/A  Right Biceps fem SH Nml Nml Nml *1- *1+ *1+ *1+ Nml N/A  Right Gluteus med Nml Nml Nml Nml Nml Nml Nml Nml N/A  Right FDI Nml Nml Nml *1- *1+ *1+ *1+ Nml N/A  Right EIP Nml Nml Nml *1- *1+ *1+ *1+ Nml N/A  Right Pronator teres Nml Nml Nml Nml Nml Nml Nml Nml N/A  Right Biceps Nml Nml Nml Nml Nml Nml Nml Nml N/A  Right Triceps lat hd Nml Nml Nml Nml Nml Nml Nml Nml N/A  Right Deltoid Nml Nml Nml Nml Nml Nml Nml Nml N/A  Right C7 PSP Nml Nml Nml *1- *1+ *1+ *1+ Nml N/A      Waveforms:  Motor           Sensory              H-Reflex

## 2023-04-05 NOTE — Telephone Encounter (Signed)
Discussed the results of patient's EMG after the procedure today. 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 There was no evidence of a large fiber neuropathy that may be contributing to symptoms.  Patient has started PT. She will follow up as planned with me on 06/23/23.  All questions were answered.  Jacquelyne Balint, MD [facility]

## 2023-04-06 ENCOUNTER — Encounter: Payer: Self-pay | Admitting: Physical Therapy

## 2023-04-06 ENCOUNTER — Ambulatory Visit: Payer: Medicare PPO | Admitting: Physical Therapy

## 2023-04-06 DIAGNOSIS — R2681 Unsteadiness on feet: Secondary | ICD-10-CM | POA: Diagnosis not present

## 2023-04-06 DIAGNOSIS — R296 Repeated falls: Secondary | ICD-10-CM | POA: Diagnosis not present

## 2023-04-06 DIAGNOSIS — R2689 Other abnormalities of gait and mobility: Secondary | ICD-10-CM | POA: Diagnosis not present

## 2023-04-06 DIAGNOSIS — M6281 Muscle weakness (generalized): Secondary | ICD-10-CM | POA: Diagnosis not present

## 2023-04-06 NOTE — Therapy (Signed)
Daily Note  Patient Name: Jo Gomez MRN: [id number] DOB:Oct 23, 1945, 77 y.o., female Today's Date: 04/06/2023   PT End of Session - 04/06/23 1528     Visit Number 2    Number of Visits --   1-2x/week   Date for PT Re-Evaluation 05/29/23    Authorization Type [employer] MCR - FOTO    Progress Note Due on Visit 10    PT Start Time 1530    PT Stop Time 1612    PT Time Calculation (min) 42 min             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   Asthma    Depression    GERD (gastroesophageal reflux disease)    diet controlled, no med   History of endoscopy    HSV infection    IgA deficiency (HCC)    Low vitamin B12 level    Osteoporosis    Ovarian cyst    TAH/BSO   Plaque psoriasis    Shingles    Thyroid dysfunction    Past Surgical History:  Procedure Laterality Date   broken arm     surgery for this   BROW LIFT  08/2013   COLONOSCOPY     LUMBAR DISC SURGERY  10/2020   L-4, L-5, S-1, Dr. Venetia Maxon   THYROIDECTOMY  2000   TOTAL ABDOMINAL HYSTERECTOMY W/ BILATERAL SALPINGOOPHORECTOMY  1999   UPPER GASTROINTESTINAL ENDOSCOPY     Patient Active Problem List   Diagnosis Date Noted   Anxiety 03/25/2023   Carotid artery disease (HCC) 02/23/2023   Lightheadedness 02/05/2023   Poor balance 02/05/2023   Frequent falls 02/05/2023   Palpitations 02/05/2023   Skin tear of lower leg without complication, left, initial encounter 01/08/2023   Arthropathy of lumbar facet joint 02/16/2022   Mild persistent asthma without complication 07/07/2021   Bilateral hand pain 05/14/2021   Myalgia 04/17/2021   Polyarthralgia 04/17/2021   Heartburn 03/31/2021   Psoriasis 02/12/2021   Bilateral lumbar radiculopathy 01/15/2021   Elevated LFTs 09/10/2020   Chronic bronchitis (HCC) 02/01/2019   Migraine headache 01/31/2019   Cardiac murmur 09/29/2016   Pain of both shoulder joints 06/24/2016   Colloid cyst of  third ventricle (HCC) 04/29/2016   Insomnia 09/16/2015   Low back pain radiating to left leg 06/25/2014   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   Brow ptosis 07/13/2012   Vitamin B12 deficiency 03/05/2010   IgA deficiency (HCC) 10/18/2009   Hypothyroidism 09/05/2007   Depression 10/21/2006   Osteopenia 10/21/2006    PCP: Pincus Sanes, MD  REFERRING PROVIDER: Pincus Sanes, MD  THERAPY DIAG:  Unsteadiness on feet  Muscle weakness  REFERRING DIAG: Imbalance [R26.89], Falls frequently [R29.6]   Rationale for Evaluation and Treatment:  Rehabilitation  SUBJECTIVE:  PERTINENT PAST HISTORY:  Osteopenia, low back surgery (L5/S1), migraine, anxiety        PRECAUTIONS: Fall  WEIGHT BEARING RESTRICTIONS No  FALLS:  Has patient fallen in last 6 months? Yes, Number of falls: 15 (up to 2-3x/week)  MOI/History of condition:  Onset date: ~ 6 months  SUBJECTIVE STATEMENT  Pt reports that she has been HEP compliant and may see some benefit.   Red flags:  denies  Pain:  CC is balance not pain  Occupation: na  Assistive Device: none used  Hand Dominance: NA  Patient Goals/Specific Activities: building  strength, endurance, and balance   OBJECTIVE:   DIAGNOSTIC FINDINGS:  IMPRESSION: 1. No CT evidence for acute intracranial abnormality. 2. Stable 6 mm colloid cyst.  GENERAL OBSERVATION/GAIT:  WBOS with small step length  SENSATION:  Light touch: Deficits Bil lower legs    LE MMT:  MMT Right (Eval) Left (Eval)  Hip flexion (L2, L3) 3+ 3+  Knee extension (L3) 4 3+  Knee flexion 4 4  Hip abduction 3 3+  Hip extension    Hip external rotation    Hip internal rotation    Hip adduction    Ankle dorsiflexion (L4)    Ankle plantarflexion (S1) Unable SL heel raise Unable s/l heel raise  Ankle inversion    Ankle eversion    Great Toe ext (L5)    Grossly     (Blank rows = not tested, score listed is out of 5 possible points.  N =  WNL, D = diminished, C = clear for gross weakness with myotome testing, * = concordant pain with testing)    LE ROM:  ROM Right (Eval) Left (Eval)  Hip flexion    Hip extension    Hip abduction    Hip adduction    Hip internal rotation    Hip external rotation    Knee extension    Knee flexion    Ankle dorsiflexion    Ankle plantarflexion    Ankle inversion    Ankle eversion     (Blank rows = not tested, N = WNL, * = concordant pain with testing)  Functional Tests  Eval (04/06/2023)    30'' STS: 8x  UE used? N    10 m max gait speed: 12'', .83 m/s, AD: none    Progressive balance screen (highest level completed for >/= 10''):  Feet together: 10'' Semi Tandem: R in rear 10'', L in rear 10'' EC <3'' bil Tandem: R in rear unable'', L in rear unable''  Significantly harder on unstable surfaces and with head turns    2 MWT: 388'    TUG: 10                                          PATIENT SURVEYS:  FOTO 44 -> 53     PATIENT EDUCATION:  POC, diagnosis, prognosis, HEP, and outcome measures.  Pt educated via explanation, demonstration, and handout (HEP).  Pt confirms understanding verbally.   HOME EXERCISE PROGRAM: Access Code: [email or web address] URL: [email or web address] Date: 04/06/2023 Prepared by: Alphonzo Severance  Exercises - Sit to Stand with Counter Support  - 1 x daily - 7 x weekly - 3 sets - 10 reps - Semi-Tandem Balance at [employer] Eyes Open  - 2 x daily - 7 x weekly - 1 sets - 3 reps - 1 min hold - Heel Toe Raises with Counter Support  - 1 x daily - 7 x weekly - 3 sets - 15 - 20 reps - Side Stepping with Resistance at Ankles  - 1 x daily - 7 x weekly - laps 4-5  Treatment priorities   Eval        balance        LE strength                                  TODAY'S TREATMENT  [facility] Adult PT Treatment:  Therapeutic Exercise: Lateral walking with RTB at ankles Knee ext machine 2x10 - 10# Knee flexion machine 2x10 STS to  airex w/ 5# - 2x10  Neuromuscular re-ed: Semi tandem on foam Walking with head turns Walking with ball toss Rocker board (wooden)  Airex beam walking fwd and lat  ASSESSMENT:  CLINICAL IMPRESSION: Pt presents to clinic with reports of some improvement in balance with HEP.  We concentrated on balance on compliant surfaces.  Pt requires CGA throughout for safety.  Pt is significantly challenged with strength based exercise and we will continue to progress these as tolerated.  HEP updated.  OBJECTIVE IMPAIRMENTS: LE strength, balance, gait  ACTIVITY LIMITATIONS: yardwork, housework, showering, safe ambulation  PERSONAL FACTORS: See medical history and pertinent history   REHAB POTENTIAL: Good  CLINICAL DECISION MAKING: Stable/uncomplicated  EVALUATION COMPLEXITY: Low   GOALS:   SHORT TERM GOALS: Target date: 04/30/2023   Jaiyah will be >75% HEP compliant to improve carryover between sessions and facilitate independent management of condition  Evaluation: ongoing Goal status: INITIAL   LONG TERM GOALS: Target date: 05/28/2023   Hananiah will improve FOTO score to 53 as a proxy for functional improvement  Evaluation/Baseline: 44 Goal status: INITIAL    2.  Tayshia will be able to stand for >30'' in tandem stance, to show a significant improvement in balance in order to reduce fall risk   Evaluation/Baseline: unable tandem bil Goal status: INITIAL   3.  Airionna will improve 30'' STS (MCID 2) to >/= 12x (w/ UE?: n) to show improved LE strength and improved transfers   Evaluation/Baseline: 8x  w/ UE? n Goal status: INITIAL   4.  Lyanne will improve 10 meter max gait speed to 1 m/s (.1 m/s MCID) to show functional improvement in ambulation   Evaluation/Baseline: .8 m/s Goal status: INITIAL   Norms:     5.  Adileny will express significant improvement in confidence with yardwork  Evaluation/Baseline: limited Goal status: INITIAL    PLAN: PT FREQUENCY:  1-2x/week  PT DURATION: 8 weeks  PLANNED INTERVENTIONS: Therapeutic exercises, Aquatic therapy, Therapeutic activity, Neuro Muscular re-education, Gait training, Patient/Family education, Joint mobilization, Dry Needling, Electrical stimulation, Spinal mobilization and/or manipulation, Moist heat, Taping, Vasopneumatic device, Ionotophoresis 4mg /ml Dexamethasone, and Manual therapy   Alphonzo Severance PT, DPT 04/06/2023, 4:19 PM

## 2023-04-07 ENCOUNTER — Encounter: Payer: Self-pay | Admitting: Neurology

## 2023-04-07 ENCOUNTER — Ambulatory Visit: Payer: Medicare PPO | Admitting: Neurology

## 2023-04-09 ENCOUNTER — Other Ambulatory Visit: Payer: Self-pay

## 2023-04-12 ENCOUNTER — Ambulatory Visit (HOSPITAL_BASED_OUTPATIENT_CLINIC_OR_DEPARTMENT_OTHER): Payer: Medicare PPO | Admitting: Cardiology

## 2023-04-12 ENCOUNTER — Encounter (HOSPITAL_BASED_OUTPATIENT_CLINIC_OR_DEPARTMENT_OTHER): Payer: Self-pay | Admitting: Cardiology

## 2023-04-12 VITALS — BP 127/75 | HR 77 | Ht 60.0 in | Wt 117.5 lb

## 2023-04-12 DIAGNOSIS — Z8249 Family history of ischemic heart disease and other diseases of the circulatory system: Secondary | ICD-10-CM | POA: Diagnosis not present

## 2023-04-12 DIAGNOSIS — R42 Dizziness and giddiness: Secondary | ICD-10-CM

## 2023-04-12 DIAGNOSIS — R079 Chest pain, unspecified: Secondary | ICD-10-CM | POA: Diagnosis not present

## 2023-04-12 DIAGNOSIS — R002 Palpitations: Secondary | ICD-10-CM | POA: Diagnosis not present

## 2023-04-12 NOTE — Progress Notes (Signed)
Cardiology Office Note:  .    Date:  04/12/2023  ID:  Jo Gomez, DOB 08/09/1946, MRN [id number] PCP: Pincus Sanes, MD  [facility] Providers Cardiologist:  Jodelle Red, MD     History of Present Illness: .    Jo Gomez is a 77 y.o. female with a hx of hypothyroidism, chronic lumbar radiculopathy, osteopenia, neuropathy, here for the evaluation of palpitations, lightheadedness.  She was seen by her PCP Dr. Lawerance Bach on 02/05/2023. She complained of increased frequency of falls, loss of balance, and pounding heart palpitations. Did not always recall how or why she fell; uncertain if lightheadedness preceded her falls. Her pounding palpitations had been as high as 171 bpm while walking uphill during yard work. Not felt at rest. EKG was performed at that visit showing NSR at 62 bpm, nonspecific T wave abnormality. Unchanged from prior. A carotid ultrasound and 1 week Holter monitor was ordered and she was referred to cardiology. Also referred to neurology.  Tachycardia/palpitations: -Initial onset: She has had 3 episodes of pounding, painful palpitations while walking uphill in her yard. Highest heart rate at the time was 171 bpm.  -Frequency/Duration: Happened 3 times, days apart. No episodes since then. -Associated symptoms: She also has episodes of chest discomfort that she describes as a dull pressure, generalized throughout her chest, sometimes associated with nausea or hard heart beats. During the episodes she may feel shaky. Needs to take deep breaths to try and relax with mild relief. Her chest discomfort does not seem to be influenced by diet.  -Aggravating/alleviating factors: None  -Syncope/near syncope: None. -Prior cardiac history: None -Prior ECG: EKG was performed 02/05/2023 with PCP showing NSR at 62 bpm, nonspecific T wave abnormality. -Prior workup: TTE 10/2016 with LVEF 55-60%, normal diastolic parameters. Holter monitor 01/2023 predominantly NSR,  Rare PACs and PVCs. Carotid dopplers 02/2023 revealed 1-39% stenosis of the left ICA; no evidence of stenosis in the right ICA. -Prior treatment: None. -Tobacco: She is a former smoker. Smoked an average of 0.5 packs/day for 3.0 years; Total pack years: 1.5  -OTC supplements: On vitamin B12 and vitamin D supplementation. -Comorbidities: Hypothyroidism - stable on levothyroxine 75 mcg daily. Recent Dexa scan showing severe osteopenia with high frax -bone density slightly worse. In the past she had a period of hypertensive blood pressures to the 150's systolic. However, in the last few years her blood pressure has decreased at home. -Exercise level: She has been trying to go out on walks more often for exercise. Completing PT exercises at home, but has developed some worsening pains from this. Additionally she complains of episodes of lightheadedness, which she also describes as being "brain-numb" in regards to her orientation. Having difficulty with standing, feels like she is moving, closing her eyes makes it worse.  -Labs: TSH, kidney function/electrolytes, CBC reviewed. -Cardiac ROS:  She denies any peripheral edema, headaches, syncope, orthopnea, or PND. -Family history: Her mother had a heart attack around age 15 with subsequent angioplasty.  Currently not feeling well due to a sinus infection.  ROS:  Please see the history of present illness. ROS otherwise negative except as noted.  (+) Chest discomfort (+) Lightheadedness  Studies Reviewed: Marland Kitchen         Bilateral Carotid Doppler  02/23/2023: Summary:  Right Carotid: There is no evidence of stenosis in the right ICA. The extracranial vessels were near-normal with only minimal wall thickening or plaque.   Left Carotid: Velocities in the left ICA are consistent with  a 1-39% stenosis.   Vertebrals: Bilateral vertebral arteries demonstrate antegrade flow.  Subclavians: Normal flow hemodynamics were seen in bilateral subclavian arteries.    Holter Monitor  01/2023:   Predominant rhythm was normal sinus rhythm with an average heart rate of 69 bpm and ranged from 48 to 114 bpm   Rare PACs, atrial couplets, atrial triplets   Rare PVC   Patch Wear Time:  4 days and 3 hours (2024-06-17T12:48:28-0400 to 2024-06-21T15:54:36-0400)   Patient had a min HR of 48 bpm, max HR of 114 bpm, and avg HR of 69 bpm. Predominant underlying rhythm was Sinus Rhythm. Isolated SVEs were rare (<1.0%), SVE Couplets were rare (<1.0%), and SVE Triplets were rare (<1.0%). Isolated VEs were rare (<1.0%),  and no VE Couplets or VE Triplets were present.  Physical Exam:    VS:  BP 127/75   Pulse 77   Ht 5' (1.524 m)   Wt 117 lb 8 oz (53.3 kg)   LMP 08/24/1997   BMI 22.95 kg/m    Wt Readings from Last 3 Encounters:  04/12/23 117 lb 8 oz (53.3 kg)  03/25/23 114 lb 9.6 oz (52 kg)  03/24/23 116 lb (52.6 kg)    Orthostatic VS for the past 24 hrs (Last 3 readings):  BP- Lying Pulse- Lying BP- Sitting Pulse- Sitting BP- Standing at 0 minutes Pulse- Standing at 0 minutes BP- Standing at 3 minutes Pulse- Standing at 3 minutes  04/12/23 1059 134/78 77 127/75 73 127/79 75 134/75 76     GEN: Well nourished, well developed in no acute distress HEENT: Normal, moist mucous membranes NECK: No JVD CARDIAC: regular rhythm, normal S1 and S2, no rubs or gallops. No murmur. VASCULAR: Radial and DP pulses 2+ bilaterally. No carotid bruits RESPIRATORY:  Clear to auscultation without rales, wheezing or rhonchi  ABDOMEN: Soft, non-tender, non-distended MUSCULOSKELETAL:  Ambulates independently SKIN: Warm and dry, no edema NEUROLOGIC:  Alert and oriented x 3. No focal neuro deficits noted. PSYCHIATRIC:  Normal affect   ASSESSMENT AND PLAN: .    Lightheadedness, falls -Monitor, carotids without evidence of etiology -Orthostatics negative  Palpitations -monitor unremarkable  Chest pain/oressure -discussed treadmill stress, nuclear stress/lexiscan, cardiac PET  and CT coronary angiography. Discussed pros and cons of each, including but not limited to false positive/false negative risk, radiation risk, and risk of IV contrast dye. Based on shared decision making, decision was made to pursue cardiac PET -reviewed red flag warning signs that need immediate medical attention   Informed Consent   Shared Decision Making/Informed Consent The risks [chest pain, shortness of breath, cardiac arrhythmias, dizziness, blood pressure fluctuations, myocardial infarction, stroke/transient ischemic attack, nausea, vomiting, allergic reaction, radiation exposure, metallic taste sensation and life-threatening complications (estimated to be 1 in 10,000)], benefits (risk stratification, diagnosing coronary artery disease, treatment guidance) and alternatives of a cardiac PET stress test were discussed in detail with Jo Gomez and she agrees to proceed.      Dispo: Follow-up TBD based on testing results, or sooner as needed.  I,Mathew Stumpf,acting as a Neurosurgeon for [employer], MD.,have documented all relevant documentation on the behalf of Jodelle Red, MD,as directed by  Jodelle Red, MD while in the presence of Jodelle Red, MD.  I, Jodelle Red, MD, have reviewed all documentation for this visit. The documentation on 04/12/23 for the exam, diagnosis, procedures, and orders are all accurate and complete.   Signed, Carlena Bjornstad

## 2023-04-12 NOTE — Patient Instructions (Signed)
Medication Instructions:  Your physician recommends that you continue on your current medications as directed. Please refer to the Current Medication list given to you today.  *If you need a refill on your cardiac medications before your next appointment, please call your pharmacy*  Testing/Procedures: How to Prepare for Your Cardiac PET/CT Stress Test:  1. Please do not take these medications before your test:   Medications that may interfere with the cardiac pharmacological stress agent (ex. nitrates - including erectile dysfunction medications, isosorbide mononitrate, tamulosin or beta-blockers) the day of the exam. (Erectile dysfunction medication should be held for at least 72 hrs prior to test) Theophylline containing medications for 12 hours. Dipyridamole 48 hours prior to the test. Your remaining medications may be taken with water.  2. Nothing to eat or drink, except water, 3 hours prior to arrival time.   NO caffeine/decaffeinated products, or chocolate 12 hours prior to arrival.  3. NO perfume, cologne or lotion  4. Total time is 1 to 2 hours; you may want to bring reading material for the waiting time.  5. Please report to Radiology at the [facility] Main Entrance 30 minutes early for your test.  [address] [place], [place] [address]  6. Please report to Radiology at [facility] Main Entrance, medical mall, 30 mins prior to your test.  [address]  [place], [place]  [phone]  Diabetic Preparation:  Hold oral medications. You may take NPH and Lantus insulin. Do not take Humalog or Humulin R (Regular Insulin) the day of your test. Check blood sugars prior to leaving the house. If able to eat breakfast prior to 3 hour fasting, you may take all medications, including your insulin, Do not worry if you miss your breakfast dose of insulin - start at your next meal.  IF YOU THINK YOU MAY BE PREGNANT, OR ARE NURSING PLEASE  INFORM THE TECHNOLOGIST.  In preparation for your appointment, medication and supplies will be purchased.  Appointment availability is limited, so if you need to cancel or reschedule, please call the Radiology Department at [phone] [facility]) [place] [phone] [facility])  24 hours in advance to avoid a cancellation fee of $100.00  What to Expect After you Arrive:  Once you arrive and check in for your appointment, you will be taken to a preparation room within the Radiology Department.  A technologist or Nurse will obtain your medical history, verify that you are correctly prepped for the exam, and explain the procedure.  Afterwards,  an IV will be started in your arm and electrodes will be placed on your skin for EKG monitoring during the stress portion of the exam. Then you will be escorted to the PET/CT scanner.  There, staff will get you positioned on the scanner and obtain a blood pressure and EKG.  During the exam, you will continue to be connected to the EKG and blood pressure machines.  A small, safe amount of a radioactive tracer will be injected in your IV to obtain a series of pictures of your heart along with an injection of a stress agent.    After your Exam:  It is recommended that you eat a meal and drink a caffeinated beverage to counter act any effects of the stress agent.  Drink plenty of fluids for the remainder of the day and urinate frequently for the first couple of hours after the exam.  Your doctor will inform you of your test results within 7-10 business  days.  For more information and frequently asked questions, please visit our website : [email or web address]  For questions about your test or how to prepare for your test, please call: Cardiac Imaging Nurse Navigators Office: [phone]    Follow-Up: At [facility], you and your health needs are our priority.  As part of our continuing mission to provide you with exceptional heart care, we have  created designated Provider Care Teams.  These Care Teams include your primary Cardiologist (physician) and Advanced Practice Providers (APPs -  Physician Assistants and Nurse Practitioners) who all work together to provide you with the care you need, when you need it.  We recommend signing up for the patient portal called "MyChart".  Sign up information is provided on this After Visit Summary.  MyChart is used to connect with patients for Virtual Visits (Telemedicine).  Patients are able to view lab/test results, encounter notes, upcoming appointments, etc.  Non-urgent messages can be sent to your provider as well.   To learn more about what you can do with MyChart, go to [email or web address].    Your next appointment:   TBD based on results

## 2023-04-13 ENCOUNTER — Ambulatory Visit: Payer: Medicare PPO

## 2023-04-14 ENCOUNTER — Other Ambulatory Visit (HOSPITAL_COMMUNITY): Payer: Self-pay

## 2023-04-14 DIAGNOSIS — M47817 Spondylosis without myelopathy or radiculopathy, lumbosacral region: Secondary | ICD-10-CM | POA: Diagnosis not present

## 2023-04-14 DIAGNOSIS — G959 Disease of spinal cord, unspecified: Secondary | ICD-10-CM | POA: Diagnosis not present

## 2023-04-14 DIAGNOSIS — M419 Scoliosis, unspecified: Secondary | ICD-10-CM | POA: Diagnosis not present

## 2023-04-14 DIAGNOSIS — M4714 Other spondylosis with myelopathy, thoracic region: Secondary | ICD-10-CM | POA: Diagnosis not present

## 2023-04-14 DIAGNOSIS — M5137 Other intervertebral disc degeneration, lumbosacral region: Secondary | ICD-10-CM | POA: Diagnosis not present

## 2023-04-14 DIAGNOSIS — M545 Low back pain, unspecified: Secondary | ICD-10-CM | POA: Diagnosis not present

## 2023-04-14 MED ORDER — GABAPENTIN 100 MG PO CAPS
100.0000 mg | ORAL_CAPSULE | Freq: Three times a day (TID) | ORAL | 11 refills | Status: DC
  Filled 2023-04-14: qty 90, 30d supply, fill #0
  Filled 2023-05-10 – 2023-05-21 (×2): qty 90, 30d supply, fill #1
  Filled 2023-06-23: qty 90, 30d supply, fill #2
  Filled 2023-07-23: qty 90, 30d supply, fill #3
  Filled 2023-10-04 – 2023-10-07 (×2): qty 90, 30d supply, fill #4
  Filled 2023-11-23: qty 90, 30d supply, fill #5

## 2023-04-15 ENCOUNTER — Other Ambulatory Visit (INDEPENDENT_AMBULATORY_CARE_PROVIDER_SITE_OTHER): Payer: Medicare PPO

## 2023-04-15 ENCOUNTER — Other Ambulatory Visit (HOSPITAL_COMMUNITY): Payer: Self-pay

## 2023-04-15 ENCOUNTER — Ambulatory Visit: Payer: Medicare PPO

## 2023-04-15 DIAGNOSIS — I6523 Occlusion and stenosis of bilateral carotid arteries: Secondary | ICD-10-CM | POA: Diagnosis not present

## 2023-04-15 DIAGNOSIS — E890 Postprocedural hypothyroidism: Secondary | ICD-10-CM | POA: Diagnosis not present

## 2023-04-15 DIAGNOSIS — M6281 Muscle weakness (generalized): Secondary | ICD-10-CM | POA: Diagnosis not present

## 2023-04-15 DIAGNOSIS — R2681 Unsteadiness on feet: Secondary | ICD-10-CM

## 2023-04-15 DIAGNOSIS — M8580 Other specified disorders of bone density and structure, unspecified site: Secondary | ICD-10-CM | POA: Diagnosis not present

## 2023-04-15 DIAGNOSIS — R296 Repeated falls: Secondary | ICD-10-CM | POA: Diagnosis not present

## 2023-04-15 DIAGNOSIS — R2689 Other abnormalities of gait and mobility: Secondary | ICD-10-CM | POA: Diagnosis not present

## 2023-04-15 LAB — CBC WITH DIFFERENTIAL/PLATELET
Basophils Absolute: 0 10*3/uL (ref 0.0–0.1)
Basophils Relative: 0.4 % (ref 0.0–3.0)
Eosinophils Absolute: 0.1 10*3/uL (ref 0.0–0.7)
Eosinophils Relative: 2.2 % (ref 0.0–5.0)
HCT: 39.8 % (ref 36.0–46.0)
Hemoglobin: 13.5 g/dL (ref 12.0–15.0)
Lymphocytes Relative: 14.3 % (ref 12.0–46.0)
Lymphs Abs: 0.9 10*3/uL (ref 0.7–4.0)
MCHC: 33.8 g/dL (ref 30.0–36.0)
MCV: 92.7 fl (ref 78.0–100.0)
Monocytes Absolute: 0.6 10*3/uL (ref 0.1–1.0)
Monocytes Relative: 8.8 % (ref 3.0–12.0)
Neutro Abs: 4.9 10*3/uL (ref 1.4–7.7)
Neutrophils Relative %: 74.3 % (ref 43.0–77.0)
Platelets: 234 10*3/uL (ref 150.0–400.0)
RBC: 4.29 Mil/uL (ref 3.87–5.11)
RDW: 13.1 % (ref 11.5–15.5)
WBC: 6.6 10*3/uL (ref 4.0–10.5)

## 2023-04-15 LAB — COMPREHENSIVE METABOLIC PANEL
ALT: 37 U/L — ABNORMAL HIGH (ref 0–35)
AST: 33 U/L (ref 0–37)
Albumin: 4.2 g/dL (ref 3.5–5.2)
Alkaline Phosphatase: 70 U/L (ref 39–117)
BUN: 15 mg/dL (ref 6–23)
CO2: 28 meq/L (ref 19–32)
Calcium: 8.8 mg/dL (ref 8.4–10.5)
Chloride: 97 meq/L (ref 96–112)
Creatinine, Ser: 0.84 mg/dL (ref 0.40–1.20)
GFR: 68.11 mL/min (ref >60.00)
Glucose, Bld: 89 mg/dL (ref 70–99)
Potassium: 3.9 meq/L (ref 3.5–5.1)
Sodium: 132 meq/L — ABNORMAL LOW (ref 135–145)
Total Bilirubin: 0.6 mg/dL (ref 0.2–1.2)
Total Protein: 6.9 g/dL (ref 6.0–8.3)

## 2023-04-15 LAB — LIPID PANEL
Cholesterol: 204 mg/dL — ABNORMAL HIGH (ref 0–200)
HDL: 59.9 mg/dL (ref >39.00)
LDL Cholesterol: 130 mg/dL — ABNORMAL HIGH (ref 0–99)
NonHDL: 143.86
Total CHOL/HDL Ratio: 3
Triglycerides: 71 mg/dL (ref 0.0–149.0)
VLDL: 14.2 mg/dL (ref 0.0–40.0)

## 2023-04-15 NOTE — Therapy (Signed)
Daily Note  Patient Name: Jo Gomez MRN: [id number] DOB:1946/08/14, 77 y.o., female Today's Date: 04/15/2023   PT End of Session - 04/15/23 1453     Visit Number 3    Date for PT Re-Evaluation 05/29/23    Authorization Type [employer] MCR - FOTO    Progress Note Due on Visit 10    PT Start Time 1453    PT Stop Time 1529    PT Time Calculation (min) 36 min    Activity Tolerance Patient tolerated treatment well    Behavior During Therapy Impulsive              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   Asthma    Depression    GERD (gastroesophageal reflux disease)    diet controlled, no med   History of endoscopy    HSV infection    IgA deficiency (HCC)    Low vitamin B12 level    Osteoporosis    Ovarian cyst    TAH/BSO   Plaque psoriasis    Shingles    Thyroid dysfunction    Past Surgical History:  Procedure Laterality Date   broken arm     surgery for this   BROW LIFT  08/2013   COLONOSCOPY     LUMBAR DISC SURGERY  10/2020   L-4, L-5, S-1, Dr. Venetia Maxon   THYROIDECTOMY  2000   TOTAL ABDOMINAL HYSTERECTOMY W/ BILATERAL SALPINGOOPHORECTOMY  1999   UPPER GASTROINTESTINAL ENDOSCOPY     Patient Active Problem List   Diagnosis Date Noted   Anxiety 03/25/2023   Carotid artery disease (HCC) 02/23/2023   Lightheadedness 02/05/2023   Poor balance 02/05/2023   Frequent falls 02/05/2023   Palpitations 02/05/2023   Skin tear of lower leg without complication, left, initial encounter 01/08/2023   Arthropathy of lumbar facet joint 02/16/2022   Mild persistent asthma without complication 07/07/2021   Bilateral hand pain 05/14/2021   Myalgia 04/17/2021   Polyarthralgia 04/17/2021   Heartburn 03/31/2021   Psoriasis 02/12/2021   Bilateral lumbar radiculopathy 01/15/2021   Elevated LFTs 09/10/2020   Chronic bronchitis (HCC) 02/01/2019   Migraine headache 01/31/2019   Cardiac murmur 09/29/2016    Pain of both shoulder joints 06/24/2016   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   Insomnia 09/16/2015   Low back pain radiating to left leg 06/25/2014   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   Brow ptosis 07/13/2012   Vitamin B12 deficiency 03/05/2010   IgA deficiency (HCC) 10/18/2009   Hypothyroidism 09/05/2007   Depression 10/21/2006   Osteopenia 10/21/2006    PCP: Pincus Sanes, MD  REFERRING PROVIDER: Pincus Sanes, MD  THERAPY DIAG:  Unsteadiness on feet  Muscle weakness  REFERRING DIAG: Imbalance [R26.89], Falls frequently [R29.6]   Rationale for Evaluation and Treatment:  Rehabilitation  SUBJECTIVE:  PERTINENT PAST HISTORY:  Osteopenia, low back surgery (L5/S1), migraine, anxiety        PRECAUTIONS: Fall  WEIGHT BEARING RESTRICTIONS No  FALLS:  Has patient fallen in last 6 months? Yes, Number of falls: 15 (up to 2-3x/week)  MOI/History of condition:  Onset date: ~ 6 months  SUBJECTIVE STATEMENT  Patient denies any falls since last visit. She has noticed that she feels some muscle fatigue with daily HEP.    Red flags:  denies  Pain:  CC is balance not pain  Occupation:  na  Assistive Device: none used  Hand Dominance: NA  Patient Goals/Specific Activities: building strength, endurance, and balance   OBJECTIVE:   DIAGNOSTIC FINDINGS:  IMPRESSION: 1. No CT evidence for acute intracranial abnormality. 2. Stable 6 mm colloid cyst.  GENERAL OBSERVATION/GAIT:  WBOS with small step length  SENSATION:  Light touch: Deficits Bil lower legs    LE MMT:  MMT Right (Eval) Left (Eval)  Hip flexion (L2, L3) 3+ 3+  Knee extension (L3) 4 3+  Knee flexion 4 4  Hip abduction 3 3+  Hip extension    Hip external rotation    Hip internal rotation    Hip adduction    Ankle dorsiflexion (L4)    Ankle plantarflexion (S1) Unable SL heel raise Unable s/l heel raise  Ankle inversion    Ankle eversion    Great  Toe ext (L5)    Grossly     (Blank rows = not tested, score listed is out of 5 possible points.  N = WNL, D = diminished, C = clear for gross weakness with myotome testing, * = concordant pain with testing)    LE ROM:  ROM Right (Eval) Left (Eval)  Hip flexion    Hip extension    Hip abduction    Hip adduction    Hip internal rotation    Hip external rotation    Knee extension    Knee flexion    Ankle dorsiflexion    Ankle plantarflexion    Ankle inversion    Ankle eversion     (Blank rows = not tested, N = WNL, * = concordant pain with testing)  Functional Tests  Eval    30'' STS: 8x  UE used? N    10 m max gait speed: 12'', .83 m/s, AD: none    Progressive balance screen (highest level completed for >/= 10''):  Feet together: 10'' Semi Tandem: R in rear 10'', L in rear 10'' EC <3'' bil Tandem: R in rear unable'', L in rear unable''  Significantly harder on unstable surfaces and with head turns    2 MWT: 388'    TUG: 10                                          PATIENT SURVEYS:  FOTO 44 -> 53     PATIENT EDUCATION:  POC, diagnosis, prognosis, HEP, and outcome measures.  Pt educated via explanation, demonstration, and handout (HEP).  Pt confirms understanding verbally.   HOME EXERCISE PROGRAM: Access Code: [email or web address] URL: [email or web address] Date: 04/06/2023 Prepared by: Alphonzo Severance  Exercises - Side Stepping with Resistance at Ankles  - 1 x daily - 7 x weekly - laps 4-5 - Sit to Stand with Counter Support  - 1 x daily - 7 x weekly - 3 sets - 10 reps - Semi-Tandem Balance at [employer] Eyes Open  - 2 x daily - 7 x weekly - 1 sets - 3 reps - 1 min hold - Heel Toe Raises with Counter Support  - 1 x daily - 7 x weekly - 3 sets - 15 - 20 reps   Treatment priorities   Eval        balance        LE strength  TODAY'S TREATMENT    [facility] Adult PT Treatment:                                                 DATE: 04/15/2023   Therapeutic Exercise: Nustep, level 3 x 5 min  Neuromuscular re-ed  //bars:  Lateral walking with mild knee bend  Airex beam walking fwd Airex beam walking lateral  Tandem walking  Rockerboard fwd/back x 1 min  Rockerboard side-to-side x 1 min  Patient education, cueing and feedback with all exercises to encourage motor planning to improved safety and navigation of obstacles/uneven surfaces/curbs    [facility] Adult PT Treatment     DATE: :  Therapeutic Exercise: Lateral walking with RTB at ankles Knee ext machine 2x10 - 10# Knee flexion machine 2x10 STS to airex w/ 5# - 2x10  Neuromuscular re-ed: Semi tandem on foam Walking with head turns Walking with ball toss Rocker board (wooden)  Airex beam walking fwd and lat   ASSESSMENT:  CLINICAL IMPRESSION: Laverna was somewhat impulsive with walking and balance exercises today. Focused on balance, motor planning, and UE placement to decrease risk of falls with exercises and daily activities. She was able to demonstrate improve navigation of airex beam at end of session. She will continue to benefit from balance training program including incorporation of reactionary balance.    OBJECTIVE IMPAIRMENTS: LE strength, balance, gait  ACTIVITY LIMITATIONS: yardwork, housework, showering, safe ambulation  PERSONAL FACTORS: See medical history and pertinent history   REHAB POTENTIAL: Good  CLINICAL DECISION MAKING: Stable/uncomplicated  EVALUATION COMPLEXITY: Low   GOALS:   SHORT TERM GOALS: Target date: 04/30/2023   Jazzie will be >75% HEP compliant to improve carryover between sessions and facilitate independent management of condition  Evaluation: ongoing Goal status: INITIAL   LONG TERM GOALS: Target date: 05/28/2023   Annarae will improve FOTO score to 53 as a proxy for functional improvement  Evaluation/Baseline: 44 Goal status: INITIAL    2.  Kashauna will be able to stand for >30''  in tandem stance, to show a significant improvement in balance in order to reduce fall risk   Evaluation/Baseline: unable tandem bil Goal status: INITIAL   3.  Maeghen will improve 30'' STS (MCID 2) to >/= 12x (w/ UE?: n) to show improved LE strength and improved transfers   Evaluation/Baseline: 8x  w/ UE? n Goal status: INITIAL   4.  Zarahi will improve 10 meter max gait speed to 1 m/s (.1 m/s MCID) to show functional improvement in ambulation   Evaluation/Baseline: .8 m/s Goal status: INITIAL   Norms:     5.  Lachae will express significant improvement in confidence with yardwork  Evaluation/Baseline: limited Goal status: INITIAL    PLAN: PT FREQUENCY: 1-2x/week  PT DURATION: 8 weeks  PLANNED INTERVENTIONS: Therapeutic exercises, Aquatic therapy, Therapeutic activity, Neuro Muscular re-education, Gait training, Patient/Family education, Joint mobilization, Dry Needling, Electrical stimulation, Spinal mobilization and/or manipulation, Moist heat, Taping, Vasopneumatic device, Ionotophoresis 4mg /ml Dexamethasone, and Manual therapy   Mauri Reading, PT, DPT  04/15/2023, 5:19 PM

## 2023-04-16 LAB — TSH: TSH: 0.88 u[IU]/mL (ref 0.35–5.50)

## 2023-04-16 LAB — VITAMIN D 25 HYDROXY (VIT D DEFICIENCY, FRACTURES): VITD: 54.15 ng/mL (ref 30.00–100.00)

## 2023-04-17 DIAGNOSIS — M545 Low back pain, unspecified: Secondary | ICD-10-CM | POA: Diagnosis not present

## 2023-04-17 DIAGNOSIS — M47817 Spondylosis without myelopathy or radiculopathy, lumbosacral region: Secondary | ICD-10-CM | POA: Diagnosis not present

## 2023-04-17 DIAGNOSIS — G8929 Other chronic pain: Secondary | ICD-10-CM | POA: Diagnosis not present

## 2023-04-19 ENCOUNTER — Other Ambulatory Visit (HOSPITAL_BASED_OUTPATIENT_CLINIC_OR_DEPARTMENT_OTHER): Payer: Self-pay | Admitting: Neurosurgery

## 2023-04-19 DIAGNOSIS — M4714 Other spondylosis with myelopathy, thoracic region: Secondary | ICD-10-CM

## 2023-04-19 DIAGNOSIS — G959 Disease of spinal cord, unspecified: Secondary | ICD-10-CM

## 2023-04-20 ENCOUNTER — Ambulatory Visit: Payer: Medicare PPO | Admitting: Internal Medicine

## 2023-04-20 ENCOUNTER — Ambulatory Visit (INDEPENDENT_AMBULATORY_CARE_PROVIDER_SITE_OTHER): Payer: Medicare PPO | Admitting: Licensed Clinical Social Worker

## 2023-04-20 DIAGNOSIS — F331 Major depressive disorder, recurrent, moderate: Secondary | ICD-10-CM
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
## 2023-04-21 ENCOUNTER — Encounter: Payer: Self-pay | Admitting: Physical Therapy

## 2023-04-21 ENCOUNTER — Ambulatory Visit: Payer: Medicare PPO | Admitting: Physical Therapy

## 2023-04-21 ENCOUNTER — Other Ambulatory Visit (HOSPITAL_COMMUNITY): Payer: Self-pay

## 2023-04-21 DIAGNOSIS — R2689 Other abnormalities of gait and mobility: Secondary | ICD-10-CM | POA: Diagnosis not present

## 2023-04-21 DIAGNOSIS — R2681 Unsteadiness on feet: Secondary | ICD-10-CM

## 2023-04-21 DIAGNOSIS — R296 Repeated falls: Secondary | ICD-10-CM | POA: Diagnosis not present

## 2023-04-21 DIAGNOSIS — M6281 Muscle weakness (generalized): Secondary | ICD-10-CM

## 2023-04-21 NOTE — Therapy (Signed)
Daily Note  Patient Name: Jo Gomez MRN: [id number] DOB:23-Oct-1945, 77 y.o., female Today's Date: 04/21/2023   PT End of Session - 04/21/23 1130     Visit Number 4    Date for PT Re-Evaluation 05/29/23    Authorization Type [employer] MCR - FOTO    Progress Note Due on Visit 10    PT Start Time 1130    PT Stop Time 1210    PT Time Calculation (min) 40 min    Activity Tolerance Patient tolerated treatment well    Behavior During Therapy Impulsive              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   Asthma    Depression    GERD (gastroesophageal reflux disease)    diet controlled, no med   History of endoscopy    HSV infection    IgA deficiency (HCC)    Low vitamin B12 level    Osteoporosis    Ovarian cyst    TAH/BSO   Plaque psoriasis    Shingles    Thyroid dysfunction    Past Surgical History:  Procedure Laterality Date   broken arm     surgery for this   BROW LIFT  08/2013   COLONOSCOPY     LUMBAR DISC SURGERY  10/2020   L-4, L-5, S-1, Dr. Venetia Maxon   THYROIDECTOMY  2000   TOTAL ABDOMINAL HYSTERECTOMY W/ BILATERAL SALPINGOOPHORECTOMY  1999   UPPER GASTROINTESTINAL ENDOSCOPY     Patient Active Problem List   Diagnosis Date Noted   Anxiety 03/25/2023   Carotid artery disease (HCC) 02/23/2023   Lightheadedness 02/05/2023   Poor balance 02/05/2023   Frequent falls 02/05/2023   Palpitations 02/05/2023   Skin tear of lower leg without complication, left, initial encounter 01/08/2023   Arthropathy of lumbar facet joint 02/16/2022   Mild persistent asthma without complication 07/07/2021   Bilateral hand pain 05/14/2021   Myalgia 04/17/2021   Polyarthralgia 04/17/2021   Heartburn 03/31/2021   Psoriasis 02/12/2021   Bilateral lumbar radiculopathy 01/15/2021   Elevated LFTs 09/10/2020   Chronic bronchitis (HCC) 02/01/2019   Migraine headache 01/31/2019   Cardiac murmur 09/29/2016    Pain of both shoulder joints 06/24/2016   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   Insomnia 09/16/2015   Low back pain radiating to left leg 06/25/2014   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   Brow ptosis 07/13/2012   Vitamin B12 deficiency 03/05/2010   IgA deficiency (HCC) 10/18/2009   Hypothyroidism 09/05/2007   Depression 10/21/2006   Osteopenia 10/21/2006    PCP: Pincus Sanes, MD  REFERRING PROVIDER: Pincus Sanes, MD  THERAPY DIAG:  Unsteadiness on feet  Muscle weakness  REFERRING DIAG: Imbalance [R26.89], Falls frequently [R29.6]   Rationale for Evaluation and Treatment:  Rehabilitation  SUBJECTIVE:  PERTINENT PAST HISTORY:  Osteopenia, low back surgery (L5/S1), migraine, anxiety        PRECAUTIONS: Fall  WEIGHT BEARING RESTRICTIONS No  FALLS:  Has patient fallen in last 6 months? Yes, Number of falls: 15 (up to 2-3x/week)  MOI/History of condition:  Onset date: ~ 6 months  SUBJECTIVE STATEMENT  Pt reports that she was sore after completing her HEP so she took a little    Red flags:  denies  Pain:  CC is balance not pain  Occupation: na  Assistive Device:  none used  Hand Dominance: NA  Patient Goals/Specific Activities: building strength, endurance, and balance   OBJECTIVE:   DIAGNOSTIC FINDINGS:  IMPRESSION: 1. No CT evidence for acute intracranial abnormality. 2. Stable 6 mm colloid cyst.  GENERAL OBSERVATION/GAIT:  WBOS with small step length  SENSATION:  Light touch: Deficits Bil lower legs    LE MMT:  MMT Right (Eval) Left (Eval)  Hip flexion (L2, L3) 3+ 3+  Knee extension (L3) 4 3+  Knee flexion 4 4  Hip abduction 3 3+  Hip extension    Hip external rotation    Hip internal rotation    Hip adduction    Ankle dorsiflexion (L4)    Ankle plantarflexion (S1) Unable SL heel raise Unable s/l heel raise  Ankle inversion    Ankle eversion    Great Toe ext (L5)    Grossly     (Blank  rows = not tested, score listed is out of 5 possible points.  N = WNL, D = diminished, C = clear for gross weakness with myotome testing, * = concordant pain with testing)    LE ROM:  ROM Right (Eval) Left (Eval)  Hip flexion    Hip extension    Hip abduction    Hip adduction    Hip internal rotation    Hip external rotation    Knee extension    Knee flexion    Ankle dorsiflexion    Ankle plantarflexion    Ankle inversion    Ankle eversion     (Blank rows = not tested, N = WNL, * = concordant pain with testing)  Functional Tests  Eval    30'' STS: 8x  UE used? N    10 m max gait speed: 12'', .83 m/s, AD: none    Progressive balance screen (highest level completed for >/= 10''):  Feet together: 10'' Semi Tandem: R in rear 10'', L in rear 10'' EC <3'' bil Tandem: R in rear unable'', L in rear unable''  Significantly harder on unstable surfaces and with head turns    2 MWT: 388'    TUG: 10                                          PATIENT SURVEYS:  FOTO 44 -> 53     PATIENT EDUCATION:  POC, diagnosis, prognosis, HEP, and outcome measures.  Pt educated via explanation, demonstration, and handout (HEP).  Pt confirms understanding verbally.   HOME EXERCISE PROGRAM: Access Code: [email or web address] URL: [email or web address] Date: 04/06/2023 Prepared by: Alphonzo Severance  Exercises - Side Stepping with Resistance at Ankles  - 1 x daily - 7 x weekly - laps 4-5 - Sit to Stand with Counter Support  - 1 x daily - 7 x weekly - 3 sets - 10 reps - Semi-Tandem Balance at [employer] Eyes Open  - 2 x daily - 7 x weekly - 1 sets - 3 reps - 1 min hold - Heel Toe Raises with Counter Support  - 1 x daily - 7 x weekly - 3 sets - 15 - 20 reps   Treatment priorities   Eval        balance        LE strength  TODAY'S TREATMENT    [facility] Adult PT Treatment:                                                DATE:  04/21/2023   Therapeutic Exercise: Nustep, level 3 x 5 min Lateral walking with GTB at ankles Knee ext machine 3x10 - 15#  Neuromuscular re-ed  Walking with tennis bal balanced on slide board Squatting on airex to pick up object Standing on airex lifting ball and lateral turns with ball Hurdle walking fwd and backward Airex beam walking fwd and bkwd      ASSESSMENT:  CLINICAL IMPRESSION: Jo Gomez was somewhat impulsive with walking and balance exercises today which did improve with cuing.  We worked on multitasking and movements which challenged BOS on unstable surfaces to improve safety with gardening.  Pt will attend a few aquatic therapy sessions to establish HEP.   OBJECTIVE IMPAIRMENTS: LE strength, balance, gait  ACTIVITY LIMITATIONS: yardwork, housework, showering, safe ambulation  PERSONAL FACTORS: See medical history and pertinent history   REHAB POTENTIAL: Good  CLINICAL DECISION MAKING: Stable/uncomplicated  EVALUATION COMPLEXITY: Low   GOALS:   SHORT TERM GOALS: Target date: 04/30/2023   Karry will be >75% HEP compliant to improve carryover between sessions and facilitate independent management of condition  Evaluation: ongoing Goal status: INITIAL   LONG TERM GOALS: Target date: 05/28/2023   Kehaulani will improve FOTO score to 53 as a proxy for functional improvement  Evaluation/Baseline: 44 Goal status: INITIAL    2.  Cearra will be able to stand for >30'' in tandem stance, to show a significant improvement in balance in order to reduce fall risk   Evaluation/Baseline: unable tandem bil Goal status: INITIAL   3.  Natallia will improve 30'' STS (MCID 2) to >/= 12x (w/ UE?: n) to show improved LE strength and improved transfers   Evaluation/Baseline: 8x  w/ UE? n Goal status: INITIAL   4.  Tanishi will improve 10 meter max gait speed to 1 m/s (.1 m/s MCID) to show functional improvement in ambulation   Evaluation/Baseline: .8 m/s Goal status:  INITIAL   Norms:     5.  Edwardine will express significant improvement in confidence with yardwork  Evaluation/Baseline: limited Goal status: INITIAL    PLAN: PT FREQUENCY: 1-2x/week  PT DURATION: 8 weeks  PLANNED INTERVENTIONS: Therapeutic exercises, Aquatic therapy, Therapeutic activity, Neuro Muscular re-education, Gait training, Patient/Family education, Joint mobilization, Dry Needling, Electrical stimulation, Spinal mobilization and/or manipulation, Moist heat, Taping, Vasopneumatic device, Ionotophoresis 4mg /ml Dexamethasone, and Manual therapy   Kimberlee Nearing Anush Wiedeman PT 04/21/2023, 12:34 PM

## 2023-04-23 ENCOUNTER — Ambulatory Visit (HOSPITAL_BASED_OUTPATIENT_CLINIC_OR_DEPARTMENT_OTHER)
Admission: RE | Admit: 2023-04-23 | Discharge: 2023-04-23 | Disposition: A | Discharge status: Home / Self Care | Payer: Medicare PPO | Source: Ambulatory Visit | Attending: Neurosurgery | Admitting: Neurosurgery

## 2023-04-23 ENCOUNTER — Encounter (HOSPITAL_COMMUNITY): Payer: Self-pay

## 2023-04-23 ENCOUNTER — Telehealth (HOSPITAL_COMMUNITY): Payer: Self-pay | Admitting: *Deleted

## 2023-04-23 ENCOUNTER — Ambulatory Visit: Payer: Medicare PPO

## 2023-04-23 ENCOUNTER — Encounter (HOSPITAL_BASED_OUTPATIENT_CLINIC_OR_DEPARTMENT_OTHER): Payer: Self-pay

## 2023-04-23 DIAGNOSIS — M4722 Other spondylosis with radiculopathy, cervical region: Secondary | ICD-10-CM | POA: Insufficient documentation

## 2023-04-23 DIAGNOSIS — M4714 Other spondylosis with myelopathy, thoracic region: Secondary | ICD-10-CM

## 2023-04-23 DIAGNOSIS — R2681 Unsteadiness on feet: Secondary | ICD-10-CM | POA: Diagnosis not present

## 2023-04-23 DIAGNOSIS — M6281 Muscle weakness (generalized): Secondary | ICD-10-CM

## 2023-04-23 DIAGNOSIS — R2689 Other abnormalities of gait and mobility: Secondary | ICD-10-CM | POA: Diagnosis not present

## 2023-04-23 DIAGNOSIS — R296 Repeated falls: Secondary | ICD-10-CM | POA: Diagnosis not present

## 2023-04-23 DIAGNOSIS — G959 Disease of spinal cord, unspecified: Secondary | ICD-10-CM

## 2023-04-23 DIAGNOSIS — M50021 Cervical disc disorder at C4-C5 level with myelopathy: Secondary | ICD-10-CM | POA: Diagnosis not present

## 2023-04-23 DIAGNOSIS — M4852XA Collapsed vertebra, not elsewhere classified, cervical region, initial encounter for fracture: Secondary | ICD-10-CM | POA: Diagnosis not present

## 2023-04-23 NOTE — Telephone Encounter (Signed)
Reaching out to patient to offer assistance regarding upcoming cardiac imaging study; pt verbalizes understanding of appt date/time, parking situation and where to check in, pre-test NPO status and verified current allergies; name and call back number provided for further questions should they arise  Larey Brick RN Navigator Cardiac Imaging [facility] [phone] office [phone] cell  Patient aware to avoid caffeine 12 hours prior to her cardiac PET study.

## 2023-04-23 NOTE — Therapy (Signed)
Daily Note  Patient Name: Jo Gomez MRN: [id number] DOB:[birth year], 77 y.o., female Today's Date: 04/23/2023   PT End of Session - 04/23/23 1212     Visit Number 5    Date for PT Re-Evaluation 05/29/23    Authorization Type [employer] MCR - FOTO    Progress Note Due on Visit 10    PT Start Time 1215    PT Stop Time 1255    PT Time Calculation (min) 40 min    Activity Tolerance Patient tolerated treatment well    Behavior During Therapy WFL for tasks assessed/performed               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   Asthma    Depression    GERD (gastroesophageal reflux disease)    diet controlled, no med   History of endoscopy    HSV infection    IgA deficiency (HCC)    Low vitamin B12 level    Osteoporosis    Ovarian cyst    TAH/BSO   Plaque psoriasis    Shingles    Thyroid dysfunction    Past Surgical History:  Procedure Laterality Date   broken arm     surgery for this   BROW LIFT  08/2013   COLONOSCOPY     LUMBAR DISC SURGERY  10/2020   L-4, L-5, S-1, Dr. Venetia Maxon   THYROIDECTOMY  2000   TOTAL ABDOMINAL HYSTERECTOMY W/ BILATERAL SALPINGOOPHORECTOMY  1999   UPPER GASTROINTESTINAL ENDOSCOPY     Patient Active Problem List   Diagnosis Date Noted   Anxiety 03/25/2023   Carotid artery disease (HCC) 02/23/2023   Lightheadedness 02/05/2023   Poor balance 02/05/2023   Frequent falls 02/05/2023   Palpitations 02/05/2023   Skin tear of lower leg without complication, left, initial encounter 01/08/2023   Arthropathy of lumbar facet joint 02/16/2022   Mild persistent asthma without complication 07/07/2021   Bilateral hand pain 05/14/2021   Myalgia 04/17/2021   Polyarthralgia 04/17/2021   Heartburn 03/31/2021   Psoriasis 02/12/2021   Bilateral lumbar radiculopathy 01/15/2021   Elevated LFTs 09/10/2020   Chronic bronchitis (HCC) 02/01/2019   Migraine headache 01/31/2019    Cardiac murmur 09/29/2016   Pain of both shoulder joints 06/24/2016   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   Insomnia 09/16/2015   Low back pain radiating to left leg 06/25/2014   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   Brow ptosis 07/13/2012   Vitamin B12 deficiency 03/05/2010   IgA deficiency (HCC) 10/18/2009   Hypothyroidism 09/05/2007   Depression 10/21/2006   Osteopenia 10/21/2006    PCP: Pincus Sanes, MD  REFERRING PROVIDER: Antony Madura, MD  THERAPY DIAG:  Unsteadiness on feet  Muscle weakness  REFERRING DIAG: Imbalance [R26.89], Falls frequently [R29.6]   Rationale for Evaluation and Treatment:  Rehabilitation  SUBJECTIVE:  PERTINENT PAST HISTORY:  Osteopenia, low back surgery (L5/S1), migraine, anxiety        PRECAUTIONS: Fall  WEIGHT BEARING RESTRICTIONS No  FALLS:  Has patient fallen in last 6 months? Yes, Number of falls: 15 (up to 2-3x/week)  MOI/History of condition:  Onset date: ~ 6 months  SUBJECTIVE STATEMENT  Patient denies any pain at start of session. She feels that her muscles are a little weak. She has been doing her exercises this morning.     Red flags:  denies  Pain:  CC is balance not pain  Occupation: na  Assistive Device: none used  Hand Dominance: NA  Patient Goals/Specific Activities: building strength, endurance, and balance   OBJECTIVE:   DIAGNOSTIC FINDINGS:  IMPRESSION: 1. No CT evidence for acute intracranial abnormality. 2. Stable 6 mm colloid cyst.  GENERAL OBSERVATION/GAIT:  WBOS with small step length  SENSATION:  Light touch: Deficits Bil lower legs    LE MMT:  MMT Right (Eval) Left (Eval)  Hip flexion (L2, L3) 3+ 3+  Knee extension (L3) 4 3+  Knee flexion 4 4  Hip abduction 3 3+  Hip extension    Hip external rotation    Hip internal rotation    Hip adduction    Ankle dorsiflexion (L4)    Ankle plantarflexion (S1) Unable SL heel raise Unable s/l heel  raise  Ankle inversion    Ankle eversion    Great Toe ext (L5)    Grossly     (Blank rows = not tested, score listed is out of 5 possible points.  N = WNL, D = diminished, C = clear for gross weakness with myotome testing, * = concordant pain with testing)    LE ROM:  ROM Right (Eval) Left (Eval)  Hip flexion    Hip extension    Hip abduction    Hip adduction    Hip internal rotation    Hip external rotation    Knee extension    Knee flexion    Ankle dorsiflexion    Ankle plantarflexion    Ankle inversion    Ankle eversion     (Blank rows = not tested, N = WNL, * = concordant pain with testing)  Functional Tests  Eval    30'' STS: 8x  UE used? N    10 m max gait speed: 12'', .83 m/s, AD: none    Progressive balance screen (highest level completed for >/= 10''):  Feet together: 10'' Semi Tandem: R in rear 10'', L in rear 10'' EC <3'' bil Tandem: R in rear unable'', L in rear unable''  Significantly harder on unstable surfaces and with head turns    2 MWT: 388'    TUG: 10                                          PATIENT SURVEYS:  FOTO 44 -> 53     PATIENT EDUCATION:  POC, diagnosis, prognosis, HEP, and outcome measures.  Pt educated via explanation, demonstration, and handout (HEP).  Pt confirms understanding verbally.   HOME EXERCISE PROGRAM: Access Code: [email or web address] URL: [email or web address] Date: 04/06/2023 Prepared by: Alphonzo Severance  Exercises - Side Stepping with Resistance at Ankles  - 1 x daily - 7 x weekly - laps 4-5 - Sit to Stand with Counter Support  - 1 x daily - 7 x weekly - 3 sets - 10 reps - Semi-Tandem Balance at [employer] Eyes Open  - 2 x daily - 7 x weekly - 1 sets - 3 reps - 1 min hold - Heel Toe Raises with Counter Support  - 1 x daily - 7 x weekly - 3 sets - 15 - 20 reps   Treatment priorities   Eval        balance        LE strength  TODAY'S TREATMENT     [facility] Adult PT Treatment:                                                DATE: 04/23/2023  Therapeutic Exercise: Nustep, level 3 x 5 min at end of session  Lateral walking with GTB at calf Knee ext machine 2x10 10#, x 10 15#  Neuromuscular re-ed @ // bars   [facility] Pickups/Squatting on airex x 15 Standing on airex head turns/nods eyes open , randomized by clinician Standing on airex head turns/nods eyes closed , randomized by clinician SLS on ground with cw/ccw ball rolling under opposite foot 2 x 6 each BIL  Obstacle course Airex beam walking fwd and bkwd, obstacle step over (1/2 foam, yoga block)   Plan to resume hurdles at next visit     [facility] Adult PT Treatment:                                                DATE: 04/21/2023   Therapeutic Exercise: Nustep, level 3 x 5 min Lateral walking with GTB at ankles Knee ext machine 3x10 - 10-15#  Neuromuscular re-ed  Walking with tennis bal balanced on slide board Squatting on airex to pick up object Standing on airex lifting ball and lateral turns with ball Hurdle walking fwd and backward Airex beam walking fwd and bkwd      ASSESSMENT:  CLINICAL IMPRESSION:  Victoria demonstrated improved motor planning today, and was less impulsive with walking/balance exercises today. However, she continues to benefit from intermittent cueing to improve safe navigation of obstacles. She is challenged with balance activities concurrent with maintaining conversations, and will continue to benefit from dual tasking challenges. She will benefit from ongoing LE strengthening and balance retraining including progression to reactionary balance activities.    OBJECTIVE IMPAIRMENTS: LE strength, balance, gait  ACTIVITY LIMITATIONS: yardwork, housework, showering, safe ambulation  PERSONAL FACTORS: See medical history and pertinent history   REHAB POTENTIAL: Good  CLINICAL DECISION MAKING: Stable/uncomplicated  EVALUATION COMPLEXITY:  Low   GOALS:   SHORT TERM GOALS: Target date: 04/30/2023   Calida will be >75% HEP compliant to improve carryover between sessions and facilitate independent management of condition  Evaluation: ongoing Goal status: INITIAL   LONG TERM GOALS: Target date: 05/28/2023   Emerita will improve FOTO score to 53 as a proxy for functional improvement  Evaluation/Baseline: 44 Goal status: INITIAL    2.  Kallie will be able to stand for >30'' in tandem stance, to show a significant improvement in balance in order to reduce fall risk   Evaluation/Baseline: unable tandem bil Goal status: INITIAL   3.  Ozetta will improve 30'' STS (MCID 2) to >/= 12x (w/ UE?: n) to show improved LE strength and improved transfers   Evaluation/Baseline: 8x  w/ UE? n Goal status: INITIAL   4.  Jayse will improve 10 meter max gait speed to 1 m/s (.1 m/s MCID) to show functional improvement in ambulation   Evaluation/Baseline: .8 m/s Goal status: INITIAL   Norms:     5.  Silke will express significant improvement in confidence with yardwork  Evaluation/Baseline: limited Goal status: INITIAL    PLAN: PT FREQUENCY: 1-2x/week  PT  DURATION: 8 weeks  PLANNED INTERVENTIONS: Therapeutic exercises, Aquatic therapy, Therapeutic activity, Neuro Muscular re-education, Gait training, Patient/Family education, Joint mobilization, Dry Needling, Electrical stimulation, Spinal mobilization and/or manipulation, Moist heat, Taping, Vasopneumatic device, Ionotophoresis 4mg /ml Dexamethasone, and Manual therapy   Mauri Reading PT, DPT 04/23/2023, 1:22 PM

## 2023-04-27 ENCOUNTER — Encounter (HOSPITAL_COMMUNITY)
Admission: RE | Admit: 2023-04-27 | Discharge: 2023-04-27 | Disposition: A | Discharge status: Home / Self Care | Payer: Medicare PPO | Source: Ambulatory Visit | Attending: Cardiology | Admitting: Cardiology

## 2023-04-27 DIAGNOSIS — I700 Atherosclerosis of aorta: Secondary | ICD-10-CM | POA: Diagnosis not present

## 2023-04-27 DIAGNOSIS — R079 Chest pain, unspecified: Secondary | ICD-10-CM | POA: Diagnosis not present

## 2023-04-27 DIAGNOSIS — Z8249 Family history of ischemic heart disease and other diseases of the circulatory system: Secondary | ICD-10-CM

## 2023-04-27 DIAGNOSIS — R002 Palpitations: Secondary | ICD-10-CM

## 2023-04-27 DIAGNOSIS — R42 Dizziness and giddiness: Secondary | ICD-10-CM | POA: Diagnosis not present

## 2023-04-27 DIAGNOSIS — R918 Other nonspecific abnormal finding of lung field: Secondary | ICD-10-CM | POA: Diagnosis not present

## 2023-04-27 LAB — NM PET CT CARDIAC PERFUSION MULTI W/ABSOLUTE BLOODFLOW
LV dias vol: 48 mL (ref 46–106)
LV sys vol: 13 mL
MBFR: 2.53
Nuc Rest EF: 73 %
Nuc Stress EF: 78 %
Rest MBF: 0.96 ml/g/min
Rest Nuclear Isotope Dose: 13.8 mCi
ST Depression (mm): 0 mm
Stress MBF: 2.43 ml/g/min
Stress Nuclear Isotope Dose: 13.9 mCi

## 2023-04-27 MED ORDER — RUBIDIUM RB82 GENERATOR (RUBYFILL)
13.9100 | PACK | Freq: Once | INTRAVENOUS | Status: AC
  Administered 2023-04-27: 13.91 via INTRAVENOUS

## 2023-04-27 MED ORDER — REGADENOSON 0.4 MG/5ML IV SOLN
0.4000 mg | Freq: Once | INTRAVENOUS | Status: AC
  Administered 2023-04-27: 0.4 mg via INTRAVENOUS

## 2023-04-27 MED ORDER — REGADENOSON 0.4 MG/5ML IV SOLN
INTRAVENOUS | Status: AC
  Filled 2023-04-27: qty 5

## 2023-04-27 MED ORDER — RUBIDIUM RB82 GENERATOR (RUBYFILL)
13.7800 | PACK | Freq: Once | INTRAVENOUS | Status: AC
  Administered 2023-04-27: 13.78 via INTRAVENOUS

## 2023-04-28 ENCOUNTER — Ambulatory Visit: Payer: Medicare PPO | Attending: Neurology

## 2023-04-28 DIAGNOSIS — M6281 Muscle weakness (generalized): Secondary | ICD-10-CM | POA: Diagnosis not present

## 2023-04-28 DIAGNOSIS — R2681 Unsteadiness on feet: Secondary | ICD-10-CM | POA: Insufficient documentation

## 2023-04-28 NOTE — Therapy (Signed)
Daily Note  Patient Name: Jo Gomez MRN: [id number] DOB:1946-08-19, 77 y.o., female Today's Date: 04/28/2023   PT End of Session - 04/28/23 1012     Visit Number 6    Date for PT Re-Evaluation 05/29/23    Authorization Type [employer] MCR - FOTO    Progress Note Due on Visit 10    PT Start Time 1014    PT Stop Time 1043    PT Time Calculation (min) 29 min    Activity Tolerance Patient tolerated treatment well    Behavior During Therapy WFL for tasks assessed/performed                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   Asthma    Depression    GERD (gastroesophageal reflux disease)    diet controlled, no med   History of endoscopy    HSV infection    IgA deficiency (HCC)    Low vitamin B12 level    Osteoporosis    Ovarian cyst    TAH/BSO   Plaque psoriasis    Shingles    Thyroid dysfunction    Past Surgical History:  Procedure Laterality Date   broken arm Right    surgery for this- metal plates   BROW LIFT Bilateral 08/2013   COLONOSCOPY     LUMBAR DISC SURGERY  10/2020   L-4, L-5, S-1, Dr. Venetia Maxon   THYROIDECTOMY  2000   TOTAL ABDOMINAL HYSTERECTOMY W/ BILATERAL SALPINGOOPHORECTOMY  1999   UPPER GASTROINTESTINAL ENDOSCOPY     Patient Active Problem List   Diagnosis Date Noted   Anxiety 03/25/2023   Carotid artery disease (HCC) 02/23/2023   Lightheadedness 02/05/2023   Poor balance 02/05/2023   Frequent falls 02/05/2023   Palpitations 02/05/2023   Skin tear of lower leg without complication, left, initial encounter 01/08/2023   Arthropathy of lumbar facet joint 02/16/2022   Mild persistent asthma without complication 07/07/2021   Bilateral hand pain 05/14/2021   Myalgia 04/17/2021   Polyarthralgia 04/17/2021   Heartburn 03/31/2021   Psoriasis 02/12/2021   Bilateral lumbar radiculopathy 01/15/2021   Elevated LFTs 09/10/2020   Chronic bronchitis (HCC) 02/01/2019    Migraine headache 01/31/2019   Cardiac murmur 09/29/2016   Pain of both shoulder joints 06/24/2016   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   Insomnia 09/16/2015   Low back pain radiating to left leg 06/25/2014   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   Brow ptosis 07/13/2012   Vitamin B12 deficiency 03/05/2010   IgA deficiency (HCC) 10/18/2009   Hypothyroidism 09/05/2007   Depression 10/21/2006   Osteopenia 10/21/2006    PCP: Pincus Sanes, MD  REFERRING PROVIDER: Antony Madura, MD  THERAPY DIAG:  Unsteadiness on feet  Muscle weakness  REFERRING DIAG: Imbalance [R26.89], Falls frequently [R29.6]   Rationale for Evaluation and Treatment:  Rehabilitation  SUBJECTIVE:  PERTINENT PAST HISTORY:  Osteopenia, low back surgery (L5/S1), migraine, anxiety        PRECAUTIONS: Fall  WEIGHT BEARING RESTRICTIONS No  FALLS:  Has patient fallen in last 6 months? Yes, Number of falls: 15 (up to 2-3x/week)  MOI/History of condition:  Onset date: ~ 6 months  SUBJECTIVE STATEMENT  Patient reporting that yesterday was a really good day for her and she was able to work out in the yard a lot.     Red flags:  denies  Pain:  CC is balance not pain  Occupation: na  Assistive Device: none used  Hand Dominance: NA  Patient Goals/Specific Activities: building strength, endurance, and balance   OBJECTIVE:   DIAGNOSTIC FINDINGS:  IMPRESSION: 1. No CT evidence for acute intracranial abnormality. 2. Stable 6 mm colloid cyst.  GENERAL OBSERVATION/GAIT:  WBOS with small step length  SENSATION:  Light touch: Deficits Bil lower legs    LE MMT:  MMT Right (Eval) Left (Eval)  Hip flexion (L2, L3) 3+ 3+  Knee extension (L3) 4 3+  Knee flexion 4 4  Hip abduction 3 3+  Hip extension    Hip external rotation    Hip internal rotation    Hip adduction    Ankle dorsiflexion (L4)    Ankle plantarflexion (S1) Unable SL heel raise Unable s/l  heel raise  Ankle inversion    Ankle eversion    Great Toe ext (L5)    Grossly     (Blank rows = not tested, score listed is out of 5 possible points.  N = WNL, D = diminished, C = clear for gross weakness with myotome testing, * = concordant pain with testing)    LE ROM:  ROM Right (Eval) Left (Eval)  Hip flexion    Hip extension    Hip abduction    Hip adduction    Hip internal rotation    Hip external rotation    Knee extension    Knee flexion    Ankle dorsiflexion    Ankle plantarflexion    Ankle inversion    Ankle eversion     (Blank rows = not tested, N = WNL, * = concordant pain with testing)  Functional Tests  Eval    30'' STS: 8x  UE used? N    10 m max gait speed: 12'', .83 m/s, AD: none    Progressive balance screen (highest level completed for >/= 10''):  Feet together: 10'' Semi Tandem: R in rear 10'', L in rear 10'' EC <3'' bil Tandem: R in rear unable'', L in rear unable''  Significantly harder on unstable surfaces and with head turns    2 MWT: 388'    TUG: 10                                          PATIENT SURVEYS:  FOTO 44 -> 53     PATIENT EDUCATION:  POC, diagnosis, prognosis, HEP, and outcome measures.  Pt educated via explanation, demonstration, and handout (HEP).  Pt confirms understanding verbally.   HOME EXERCISE PROGRAM: Access Code: [email or web address] URL: [email or web address] Date: 04/06/2023 Prepared by: Alphonzo Severance  Exercises - Side Stepping with Resistance at Ankles  - 1 x daily - 7 x weekly - laps 4-5 - Sit to Stand with Counter Support  - 1 x daily - 7 x weekly - 3 sets - 10 reps - Semi-Tandem Balance at [employer] Eyes Open  - 2 x daily - 7 x weekly - 1 sets - 3 reps - 1 min hold - Heel Toe Raises with Counter Support  - 1 x daily - 7 x weekly - 3 sets - 15 - 20 reps   Treatment priorities   Eval        balance        LE strength  TODAY'S TREATMENT    [facility] Adult PT Treatment:                                                DATE: 04/28/2023  Therapeutic Exercise:  Knee ext machine 2x10, 15#  Neuromuscular re-ed @ // bars   Cone Pickups/Squatting on airex x 4 min  Standing on airex head turns/nods eyes open , randomized by clinician x 1 min each  Standing on airex head turns/nods eyes closed , randomized by clinician x 1 min each  SLS on ground with cw/ccw ball rolling under opposite foot 2 x 10 each BIL  Rockerboard AP rocking and static holds   Plan to resume rockerboard and hurdles over time.    [facility] Adult PT Treatment:                                                DATE: 04/23/2023  Therapeutic Exercise: Nustep, level 3 x 5 min at end of session  Lateral walking with GTB at calf Knee ext machine 2x10 10#, x 10 15#  Neuromuscular re-ed @ // bars   Cone Pickups/Squatting on airex x 15 Standing on airex head turns/nods eyes open , randomized by clinician Standing on airex head turns/nods eyes closed , randomized by clinician SLS on ground with cw/ccw ball rolling under opposite foot 2 x 6 each BIL  Obstacle course Airex beam walking fwd and bkwd, obstacle step over (1/2 foam, yoga block)   Plan to resume hurdles at next visit     [facility] Adult PT Treatment:                                                DATE: 04/21/2023   Therapeutic Exercise: Nustep, level 3 x 5 min Lateral walking with GTB at ankles Knee ext machine 3x10 - 10-15#  Neuromuscular re-ed  Walking with tennis bal balanced on slide board Squatting on airex to pick up object Standing on airex lifting ball and lateral turns with ball Hurdle walking fwd and backward Airex beam walking fwd and bkwd     ASSESSMENT:  CLINICAL IMPRESSION:  Orlanda was limited today by LE fatigue, however, she demonstrated improved performance of static balance activities. She has improved ability to correct LOB on foam pad today, but continues to require SBA and UE support.  We will continue to progress LE strengthening, dynamic balance activities, and reactionary balance challenges as able.   OBJECTIVE IMPAIRMENTS: LE strength, balance, gait  ACTIVITY LIMITATIONS: yardwork, housework, showering, safe ambulation  PERSONAL FACTORS: See medical history and pertinent history   REHAB POTENTIAL: Good  CLINICAL DECISION MAKING: Stable/uncomplicated  EVALUATION COMPLEXITY: Low   GOALS:   SHORT TERM GOALS: Target date: 04/30/2023   Raychell will be >75% HEP compliant to improve carryover between sessions and facilitate independent management of condition  Evaluation: ongoing Goal status: INITIAL   LONG TERM GOALS: Target date: 05/28/2023   Lameeka will improve FOTO score to 53 as a proxy for functional improvement  Evaluation/Baseline: 44 Goal status: INITIAL    2.  Danely will be able  to stand for >30'' in tandem stance, to show a significant improvement in balance in order to reduce fall risk   Evaluation/Baseline: unable tandem bil Goal status: INITIAL   3.  Jaquelina will improve 30'' STS (MCID 2) to >/= 12x (w/ UE?: n) to show improved LE strength and improved transfers   Evaluation/Baseline: 8x  w/ UE? n Goal status: INITIAL   4.  Nakecia will improve 10 meter max gait speed to 1 m/s (.1 m/s MCID) to show functional improvement in ambulation   Evaluation/Baseline: .8 m/s Goal status: INITIAL   Norms:     5.  Skylah will express significant improvement in confidence with yardwork  Evaluation/Baseline: limited Goal status: INITIAL    PLAN: PT FREQUENCY: 1-2x/week  PT DURATION: 8 weeks  PLANNED INTERVENTIONS: Therapeutic exercises, Aquatic therapy, Therapeutic activity, Neuro Muscular re-education, Gait training, Patient/Family education, Joint mobilization, Dry Needling, Electrical stimulation, Spinal mobilization and/or manipulation, Moist heat, Taping, Vasopneumatic device, Ionotophoresis 4mg /ml Dexamethasone, and Manual  therapy   Mauri Reading PT, DPT 04/28/2023, 12:28 PM

## 2023-04-30 ENCOUNTER — Ambulatory Visit: Payer: Medicare PPO

## 2023-04-30 ENCOUNTER — Encounter: Payer: Self-pay | Admitting: Internal Medicine

## 2023-04-30 ENCOUNTER — Encounter: Payer: Self-pay | Admitting: Neurology

## 2023-04-30 DIAGNOSIS — M5136 Other intervertebral disc degeneration, lumbar region: Secondary | ICD-10-CM | POA: Diagnosis not present

## 2023-05-04 ENCOUNTER — Telehealth: Payer: Self-pay | Admitting: Neurology

## 2023-05-04 NOTE — Telephone Encounter (Signed)
Called patient to discuss her recent visits to NSGY.  First, she had new MRI cervical and thoracic spine. The reads on these are pending, but there is no clear spinal stenosis.  Patient mentions occasional bowel or bladder problems. She also has some memory problems.  Patient is still having imbalance. She is currently doing PT. She has not fallen lately, though come close. She thinks she has changed her behavior by using a cane and not walking on her hills outside.  After discussion, the plan will be to await radiology read of her new MRIs. If there is no concerning pathology to explain symptoms, I will consider lumbar puncture (?NPH).  All questions were answered.  Jacquelyne Balint, MD [facility]

## 2023-05-06 ENCOUNTER — Ambulatory Visit: Payer: Medicare PPO

## 2023-05-06 DIAGNOSIS — M6281 Muscle weakness (generalized): Secondary | ICD-10-CM

## 2023-05-06 DIAGNOSIS — R2681 Unsteadiness on feet: Secondary | ICD-10-CM

## 2023-05-06 NOTE — Therapy (Signed)
Daily Note  Patient Name: Jo Gomez MRN: [id number] DOB:11-Nov-1945, 77 y.o., female Today's Date: 05/06/2023   PT End of Session - 05/06/23 1213     Visit Number 7    Date for PT Re-Evaluation 05/29/23    Authorization Type [employer] MCR - FOTO    Progress Note Due on Visit 10    PT Start Time 1215    PT Stop Time 1255    PT Time Calculation (min) 40 min    Activity Tolerance Patient tolerated treatment well    Behavior During Therapy WFL for tasks assessed/performed                 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   Asthma    Depression    GERD (gastroesophageal reflux disease)    diet controlled, no med   History of endoscopy    HSV infection    IgA deficiency (HCC)    Low vitamin B12 level    Osteoporosis    Ovarian cyst    TAH/BSO   Plaque psoriasis    Shingles    Thyroid dysfunction    Past Surgical History:  Procedure Laterality Date   broken arm Right    surgery for this- metal plates   BROW LIFT Bilateral 08/2013   COLONOSCOPY     LUMBAR DISC SURGERY  10/2020   L-4, L-5, S-1, Dr. Venetia Maxon   THYROIDECTOMY  2000   TOTAL ABDOMINAL HYSTERECTOMY W/ BILATERAL SALPINGOOPHORECTOMY  1999   UPPER GASTROINTESTINAL ENDOSCOPY     Patient Active Problem List   Diagnosis Date Noted   Anxiety 03/25/2023   Carotid artery disease (HCC) 02/23/2023   Lightheadedness 02/05/2023   Poor balance 02/05/2023   Frequent falls 02/05/2023   Palpitations 02/05/2023   Skin tear of lower leg without complication, left, initial encounter 01/08/2023   Arthropathy of lumbar facet joint 02/16/2022   Mild persistent asthma without complication 07/07/2021   Bilateral hand pain 05/14/2021   Myalgia 04/17/2021   Polyarthralgia 04/17/2021   Heartburn 03/31/2021   Psoriasis 02/12/2021   Bilateral lumbar radiculopathy 01/15/2021   Elevated LFTs 09/10/2020   Chronic bronchitis (HCC) 02/01/2019    Migraine headache 01/31/2019   Cardiac murmur 09/29/2016   Pain of both shoulder joints 06/24/2016   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  REFERRING DIAG: Imbalance [R26.89], Falls frequently [R29.6]   Rationale for Evaluation and Treatment:  Rehabilitation  SUBJECTIVE:  PERTINENT PAST HISTORY:  Osteopenia, low back surgery (L5/S1), migraine, anxiety        PRECAUTIONS: Fall  WEIGHT BEARING RESTRICTIONS No  FALLS:  Has patient fallen in last 6 months? Yes, Number of falls: 15 (up to 2-3x/week)  MOI/History of condition:  Onset date: ~ 6 months  SUBJECTIVE STATEMENT  Patient reporting that she fell recently while trying to navigate a hill.      Red flags:  denies  Pain:  CC is balance not pain  Occupation:  na  Assistive Device: none used  Hand Dominance: NA  Patient Goals/Specific Activities: building strength, endurance, and balance   OBJECTIVE:   DIAGNOSTIC FINDINGS:  IMPRESSION: 1. No CT evidence for acute intracranial abnormality. 2. Stable 6 mm colloid cyst.  GENERAL OBSERVATION/GAIT:  WBOS with small step length  SENSATION:  Light touch: Deficits Bil lower legs    LE MMT:  MMT Right (Eval) Left (Eval)  Hip flexion (L2, L3) 3+ 3+  Knee extension (L3) 4 3+  Knee flexion 4 4  Hip abduction 3 3+  Hip extension    Hip external rotation    Hip internal rotation    Hip adduction    Ankle dorsiflexion (L4)    Ankle plantarflexion (S1) Unable SL heel raise Unable s/l heel raise  Ankle inversion    Ankle eversion     Great Toe ext (L5)    Grossly     (Blank rows = not tested, score listed is out of 5 possible points.  N = WNL, D = diminished, C = clear for gross weakness with myotome testing, * = concordant pain with testing)    LE ROM:  ROM Right (Eval) Left (Eval)  Hip flexion    Hip extension    Hip abduction    Hip adduction    Hip internal rotation    Hip external rotation    Knee extension    Knee flexion    Ankle dorsiflexion    Ankle plantarflexion    Ankle inversion    Ankle eversion     (Blank rows = not tested, N = WNL, * = concordant pain with testing)  Functional Tests  Eval    30'' STS: 8x  UE used? N    10 m max gait speed: 12'', .83 m/s, AD: none    Progressive balance screen (highest level completed for >/= 10''):  Feet together: 10'' Semi Tandem: R in rear 10'', L in rear 10'' EC <3'' bil Tandem: R in rear unable'', L in rear unable''  Significantly harder on unstable surfaces and with head turns    2 MWT: 388'    TUG: 10                                          PATIENT SURVEYS:  FOTO 44 -> 53   PATIENT EDUCATION:  POC, diagnosis, prognosis, HEP, and outcome measures.  Pt educated via explanation, demonstration, and handout (HEP).  Pt confirms understanding verbally.   HOME EXERCISE PROGRAM: Access Code: [email or web address] URL: [email or web address] Date: 04/06/2023 Prepared by: Alphonzo Severance  Exercises - Side Stepping with Resistance at Ankles  - 1 x daily - 7 x weekly - laps 4-5 - Sit to Stand with Counter Support  - 1 x daily - 7 x weekly - 3 sets - 10 reps - Semi-Tandem Balance at [employer] Eyes Open  - 2 x daily - 7 x weekly - 1 sets - 3 reps - 1 min hold - Heel Toe Raises with Counter Support  - 1 x daily - 7 x weekly - 3 sets - 15 - 20 reps   Treatment priorities   Eval        balance        LE strength  TODAY'S TREATMENT   [facility] Adult PT Treatment:                                                 DATE: 05/06/2023  Therapeutic Exercise:  Knee ext machine 2x10, 15# Supine 3-way hip, x20 each: flex/ext/abduction   Neuromuscular re-ed @ // bars   Cone Pickups/Squatting on airex x 5 min (5 cones)  Standing on airex head turns/nods eyes open , randomized by clinician x 1 min each  Standing on airex head turns/nods eyes closed , randomized by clinician x 1 min each  Lateral hurdles (3) x 3 laps    [facility] Adult PT Treatment:                                                DATE: 04/28/2023  Therapeutic Exercise:  Knee ext machine 2x10, 15#  Neuromuscular re-ed @ // bars   Cone Pickups/Squatting on airex x 5 min (5 cones)  Standing on airex head turns/nods eyes open , randomized by clinician x 1 min each  Standing on airex head turns/nods eyes closed , randomized by clinician x 1 min each  SLS on ground with cw/ccw ball rolling under opposite foot 2 x 10 each BIL  Rockerboard AP rocking and static holds   Plan to resume rockerboard and hurdles over time.    [facility] Adult PT Treatment:                                                DATE: 04/23/2023  Therapeutic Exercise: Nustep, level 3 x 5 min at end of session  Lateral walking with GTB at calf Knee ext machine 2x10 10#, x 10 15#  Neuromuscular re-ed @ // bars   Cone Pickups/Squatting on airex x 15 Standing on airex head turns/nods eyes open , randomized by clinician Standing on airex head turns/nods eyes closed , randomized by clinician SLS on ground with cw/ccw ball rolling under opposite foot 2 x 6 each BIL  Obstacle course Airex beam walking fwd and bkwd, obstacle step over (1/2 foam, yoga block)   Plan to resume hurdles at next visit      ASSESSMENT:  CLINICAL IMPRESSION:  Kealia continues to demonstrate vestibular limitations and decreased LE strengthening. She did report some frustration with her current balance and strength deficits while performing lateral hurdle navigation.  She was limited with  straight leg raises by muscle fatigue. She will continue to benefit from LE strengthening program and dynamic balance challenges. She will begin aquatic environment at next visit.    OBJECTIVE IMPAIRMENTS: LE strength, balance, gait  ACTIVITY LIMITATIONS: yardwork, housework, showering, safe ambulation  PERSONAL FACTORS: See medical history and pertinent history   REHAB POTENTIAL: Good  CLINICAL DECISION MAKING: Stable/uncomplicated  EVALUATION COMPLEXITY: Low   GOALS:   SHORT TERM GOALS: Target date: 04/30/2023   Fransisca will be >75% HEP compliant to improve carryover between sessions and facilitate independent management of condition  Evaluation: ongoing Goal status: INITIAL   LONG TERM GOALS: Target date: 05/28/2023   Karlie will improve FOTO score to 53  as a proxy for functional improvement  Evaluation/Baseline: 44 Goal status: INITIAL    2.  Raylei will be able to stand for >30'' in tandem stance, to show a significant improvement in balance in order to reduce fall risk   Evaluation/Baseline: unable tandem bil Goal status: INITIAL   3.  Pina will improve 30'' STS (MCID 2) to >/= 12x (w/ UE?: n) to show improved LE strength and improved transfers   Evaluation/Baseline: 8x  w/ UE? n Goal status: INITIAL   4.  Neima will improve 10 meter max gait speed to 1 m/s (.1 m/s MCID) to show functional improvement in ambulation   Evaluation/Baseline: .8 m/s Goal status: INITIAL   Norms:     5.  Lotus will express significant improvement in confidence with yardwork  Evaluation/Baseline: limited Goal status: INITIAL    PLAN: PT FREQUENCY: 1-2x/week  PT DURATION: 8 weeks  PLANNED INTERVENTIONS: Therapeutic exercises, Aquatic therapy, Therapeutic activity, Neuro Muscular re-education, Gait training, Patient/Family education, Joint mobilization, Dry Needling, Electrical stimulation, Spinal mobilization and/or manipulation, Moist heat, Taping, Vasopneumatic  device, Ionotophoresis 4mg /ml Dexamethasone, and Manual therapy   Mauri Reading PT, DPT 05/06/2023, 1:03 PM

## 2023-05-10 ENCOUNTER — Ambulatory Visit: Payer: Medicare PPO | Admitting: Licensed Clinical Social Worker

## 2023-05-10 NOTE — Progress Notes (Signed)
Result release to pt MyChart

## 2023-05-11 ENCOUNTER — Other Ambulatory Visit: Payer: Self-pay

## 2023-05-11 ENCOUNTER — Telehealth: Payer: Self-pay | Admitting: Neurology

## 2023-05-11 DIAGNOSIS — R2689 Other abnormalities of gait and mobility: Secondary | ICD-10-CM

## 2023-05-11 DIAGNOSIS — G912 (Idiopathic) normal pressure hydrocephalus: Secondary | ICD-10-CM

## 2023-05-11 DIAGNOSIS — R296 Repeated falls: Secondary | ICD-10-CM

## 2023-05-11 NOTE — Telephone Encounter (Signed)
Called patient to discuss the results of her MRI cervical spine and thoracic spine. They did not show clear etiology of patients symptoms (imbalance). Given that she is also having memory/cognitive changes and some urinary complaints, normal pressure hydrocephalus (NPH) is a consideration. I discussed lumbar puncture with the patient. She agrees to this as a next step in the evaluation. I will order today with PT to evaluate gait before and after LP.  All questions were answered.  Jacquelyne Balint, MD [facility]

## 2023-05-12 ENCOUNTER — Ambulatory Visit: Payer: Medicare PPO | Admitting: Physical Therapy

## 2023-05-13 ENCOUNTER — Ambulatory Visit: Payer: Medicare PPO

## 2023-05-13 DIAGNOSIS — M6281 Muscle weakness (generalized): Secondary | ICD-10-CM | POA: Diagnosis not present

## 2023-05-13 DIAGNOSIS — R2681 Unsteadiness on feet: Secondary | ICD-10-CM | POA: Diagnosis not present

## 2023-05-13 NOTE — Therapy (Addendum)
Daily Note  Patient Name: Jo Gomez MRN: [id number] DOB:1945-09-12, 77 y.o., female Today's Date: 05/13/2023  PHYSICAL THERAPY DISCHARGE SUMMARY  Visits from Start of Care: 8  Patient is being discharged at this time from skilled PT per patient request. She was last seen on 05/13/23. PT spoke with patient on 06/15/23. At that time she states that she has followed up with [facility] and would like to stop PT. She is open to returning to OP PT later.   Patient agrees to discharge. Patient goals were not met. Patient is being discharged due to the patient's request.  Mauri Reading, PT, DPT   06/15/2023     PT End of Session - 05/13/23 1208     Visit Number 8    Date for PT Re-Evaluation 05/29/23    Authorization Type [employer] M[employer] - FOTO    Progress Note Due on Visit 10    PT Start Time 1215    PT Stop Time 1255    PT Time Calculation (min) 40 min    Activity Tolerance Patient tolerated treatment well    Behavior During Therapy WFL for tasks assessed/performed              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   Asthma    Depression    GERD (gastroesophageal reflux disease)    diet controlled, no med   History of endoscopy    HSV infection    IgA deficiency (HCC)    Low vitamin B12 level    Osteoporosis    Ovarian cyst    TAH/BSO   Plaque psoriasis    Shingles    Thyroid dysfunction    Past Surgical History:  Procedure Laterality Date   broken arm Right    surgery for this- metal plates   BROW LIFT Bilateral 08/2013   COLONOSCOPY     LUMBAR DISC SURGERY  10/2020   L-4, L-5, S-1, Dr. Venetia Maxon   THYROIDECTOMY  2000   TOTAL ABDOMINAL HYSTERECTOMY W/ BILATERAL SALPINGOOPHORECTOMY  1999   UPPER GASTROINTESTINAL ENDOSCOPY     Patient Active Problem List   Diagnosis Date Noted   Anxiety 03/25/2023   Carotid artery disease (HCC) 02/23/2023   Lightheadedness 02/05/2023   Poor  balance 02/05/2023   Frequent falls 02/05/2023   Palpitations 02/05/2023   Skin tear of lower leg without complication, left, initial encounter 01/08/2023   Arthropathy of lumbar facet joint 02/16/2022   Mild persistent asthma without complication 07/07/2021   Bilateral hand pain 05/14/2021   Myalgia 04/17/2021   Polyarthralgia 04/17/2021   Heartburn 03/31/2021   Psoriasis 02/12/2021   Bilateral lumbar radiculopathy 01/15/2021   Elevated LFTs 09/10/2020   Chronic bronchitis (HCC) 02/01/2019   Migraine headache 01/31/2019   Cardiac murmur 09/29/2016   Pain of both shoulder joints 06/24/2016   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   Insomnia 09/16/2015   Low back pain radiating to left leg 06/25/2014   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   Brow ptosis 07/13/2012   Vitamin B12 deficiency 03/05/2010   IgA deficiency (HCC) 10/18/2009   Hypothyroidism 09/05/2007   Depression 10/21/2006   Osteopenia 10/21/2006    PCP: Pincus Sanes, MD  REFERRING PROVIDER: Pincus Sanes, MD  THERAPY DIAG:  Unsteadiness on feet  Muscle weakness  REFERRING DIAG: Imbalance [R26.89], Falls frequently [R29.6]   Rationale for  Evaluation and Treatment:  Rehabilitation  SUBJECTIVE:  PERTINENT PAST HISTORY:  Osteopenia, low back surgery (L5/S1), migraine, anxiety        PRECAUTIONS: Fall  WEIGHT BEARING RESTRICTIONS No  FALLS:  Has patient fallen in last 6 months? Yes, Number of falls: 15 (up to 2-3x/week)  MOI/History of condition:  Onset date: ~ 6 months  SUBJECTIVE STATEMENT  Patient reports that she fell outside and also near her bathroom over the past week, no injuries to report.      Red flags:  denies  Pain:  CC is balance not pain  Occupation: na  Assistive Device: none used  Hand Dominance: NA  Patient Goals/Specific Activities: building strength, endurance, and balance   OBJECTIVE:   DIAGNOSTIC FINDINGS:  IMPRESSION: 1. No  CT evidence for acute intracranial abnormality. 2. Stable 6 mm colloid cyst.  GENERAL OBSERVATION/GAIT:  WBOS with small step length  SENSATION:  Light touch: Deficits Bil lower legs    LE MMT:  MMT Right (Eval) Left (Eval)  Hip flexion (L2, L3) 3+ 3+  Knee extension (L3) 4 3+  Knee flexion 4 4  Hip abduction 3 3+  Hip extension    Hip external rotation    Hip internal rotation    Hip adduction    Ankle dorsiflexion (L4)    Ankle plantarflexion (S1) Unable SL heel raise Unable s/l heel raise  Ankle inversion    Ankle eversion    Great Toe ext (L5)    Grossly     (Blank rows = not tested, score listed is out of 5 possible points.  N = WNL, D = diminished, C = clear for gross weakness with myotome testing, * = concordant pain with testing)    LE ROM:  ROM Right (Eval) Left (Eval)  Hip flexion    Hip extension    Hip abduction    Hip adduction    Hip internal rotation    Hip external rotation    Knee extension    Knee flexion    Ankle dorsiflexion    Ankle plantarflexion    Ankle inversion    Ankle eversion     (Blank rows = not tested, N = WNL, * = concordant pain with testing)  Functional Tests  Eval    30'' STS: 8x  UE used? N    10 m max gait speed: 12'', .83 m/s, AD: none    Progressive balance screen (highest level completed for >/= 10''):  Feet together: 10'' Semi Tandem: R in rear 10'', L in rear 10'' EC <3'' bil Tandem: R in rear unable'', L in rear unable''  Significantly harder on unstable surfaces and with head turns    2 MWT: 388'    TUG: 10                                          PATIENT SURVEYS:  FOTO 44 -> 53   PATIENT EDUCATION:  POC, diagnosis, prognosis, HEP, and outcome measures.  Pt educated via explanation, demonstration, and handout (HEP).  Pt confirms understanding verbally.   HOME EXERCISE PROGRAM: Access Code: [email or web address] URL: [email or web address] Date: 04/06/2023 Prepared by: Alphonzo Severance  Exercises - Side Stepping with Resistance at Ankles  - 1 x daily - 7 x weekly - laps 4-5 - Sit to Stand with Counter Support  - 1 x daily - 7 x weekly -  3 sets - 10 reps - Semi-Tandem Balance at [employer] Eyes Open  - 2 x daily - 7 x weekly - 1 sets - 3 reps - 1 min hold - Heel Toe Raises with Counter Support  - 1 x daily - 7 x weekly - 3 sets - 15 - 20 reps   Treatment priorities   Eval        balance        LE strength                                  TODAY'S TREATMENT  [facility] Adult PT Treatment:                                                DATE: 05/13/23 Therapeutic Exercise: Knee ext machine 2x10, 15# Knee flex machine 25# 2x10 Supine 3-way hip, x20 each: flex/ext/abduction  Neuromuscular re-ed @ // bars  Cone Pickups/Squatting on airex x 5 min (5 cones)  Standing on airex head turns/nods eyes open , randomized by clinician x 1 min each  Standing on airex head turns/nods eyes closed , randomized by clinician x 1 min each  Lateral hurdles (3) x 3 laps  Fwd hurdles (3) x 3 laps SLS on ground with cw/ccw ball rolling under opposite foot x 30" each BIL   [facility] Adult PT Treatment:                                                DATE: 05/06/2023  Therapeutic Exercise:  Knee ext machine 2x10, 15# Supine 3-way hip, x20 each: flex/ext/abduction   Neuromuscular re-ed @ // bars   Cone Pickups/Squatting on airex x 5 min (5 cones)  Standing on airex head turns/nods eyes open , randomized by clinician x 1 min each  Standing on airex head turns/nods eyes closed , randomized by clinician x 1 min each  Lateral hurdles (3) x 3 laps    [facility] Adult PT Treatment:                                                DATE: 04/28/2023  Therapeutic Exercise:  Knee ext machine 2x10, 15#  Neuromuscular re-ed @ // bars   Cone Pickups/Squatting on airex x 5 min (5 cones)  Standing on airex head turns/nods eyes open , randomized by clinician x 1 min each  Standing on airex head  turns/nods eyes closed , randomized by clinician x 1 min each  SLS on ground with cw/ccw ball rolling under opposite foot 2 x 10 each BIL  Rockerboard AP rocking and static holds   Plan to resume rockerboard and hurdles over time.      ASSESSMENT:  CLINICAL IMPRESSION: Patient presents to PT reporting continued issues with her balance and that she had two falls since last session, one outside and one inside near her bathroom. Session today continued to focus on LE strengthening and balance tasks. She remains limited by her impulsivity, but does well with cuing. Patient continues to benefit  from skilled PT services and should be progressed as able to improve functional independence.    OBJECTIVE IMPAIRMENTS: LE strength, balance, gait  ACTIVITY LIMITATIONS: yardwork, housework, showering, safe ambulation  PERSONAL FACTORS: See medical history and pertinent history   REHAB POTENTIAL: Good  CLINICAL DECISION MAKING: Stable/uncomplicated  EVALUATION COMPLEXITY: Low   GOALS:   SHORT TERM GOALS: Target date: 04/30/2023   Latondra will be >75% HEP compliant to improve carryover between sessions and facilitate independent management of condition  Evaluation: ongoing Goal status: INITIAL   LONG TERM GOALS: Target date: 05/28/2023   [place] will improve FOTO score to 53 as a proxy for functional improvement  Evaluation/Baseline: 44 Goal status: INITIAL    2.  Shiloh will be able to stand for >30'' in tandem stance, to show a significant improvement in balance in order to reduce fall risk   Evaluation/Baseline: unable tandem bil Goal status: INITIAL   3.  Shakala will improve 30'' STS (MCID 2) to >/= 12x (w/ UE?: n) to show improved LE strength and improved transfers   Evaluation/Baseline: 8x  w/ UE? n Goal status: INITIAL   4.  Avamarie will improve 10 meter max gait speed to 1 m/s (.1 m/s MCID) to show functional improvement in ambulation   Evaluation/Baseline: .8 m/s Goal  status: INITIAL   Norms:     5.  Nyrie will express significant improvement in confidence with yardwork  Evaluation/Baseline: limited Goal status: INITIAL    PLAN: PT FREQUENCY: 1-2x/week  PT DURATION: 8 weeks  PLANNED INTERVENTIONS: Therapeutic exercises, Aquatic therapy, Therapeutic activity, Neuro Muscular re-education, Gait training, Patient/Family education, Joint mobilization, Dry Needling, Electrical stimulation, Spinal mobilization and/or manipulation, Moist heat, Taping, Vasopneumatic device, Ionotophoresis 4mg /ml Dexamethasone, and Manual therapy   Berta Minor PTA 05/13/2023, 12:57 PM  Mauri Reading, PT, DPT  05/13/2023, 12:57 PM

## 2023-05-17 ENCOUNTER — Ambulatory Visit (HOSPITAL_COMMUNITY)
Admission: RE | Admit: 2023-05-17 | Discharge: 2023-05-17 | Disposition: A | Discharge status: Home / Self Care | Payer: Medicare PPO | Source: Ambulatory Visit | Attending: Neurology | Admitting: Neurology

## 2023-05-17 ENCOUNTER — Encounter: Payer: Self-pay | Admitting: Neurology

## 2023-05-17 ENCOUNTER — Ambulatory Visit (HOSPITAL_COMMUNITY)
Admission: RE | Admit: 2023-05-17 | Discharge: 2023-05-17 | Disposition: A | Discharge status: Home / Self Care | Payer: Medicare PPO | Source: Ambulatory Visit | Attending: Neurology

## 2023-05-17 ENCOUNTER — Other Ambulatory Visit: Payer: Self-pay

## 2023-05-17 DIAGNOSIS — Z9181 History of falling: Secondary | ICD-10-CM | POA: Diagnosis not present

## 2023-05-17 DIAGNOSIS — R2689 Other abnormalities of gait and mobility: Secondary | ICD-10-CM | POA: Diagnosis not present

## 2023-05-17 DIAGNOSIS — G912 (Idiopathic) normal pressure hydrocephalus: Secondary | ICD-10-CM | POA: Diagnosis not present

## 2023-05-17 DIAGNOSIS — R531 Weakness: Secondary | ICD-10-CM | POA: Diagnosis not present

## 2023-05-17 LAB — CSF CELL COUNT WITH DIFFERENTIAL
Eosinophils, CSF: 2 % — ABNORMAL HIGH (ref 0–1)
Lymphs, CSF: 18 % — ABNORMAL LOW (ref 40–80)
Monocyte-Macrophage-Spinal Fluid: 4 % — ABNORMAL LOW (ref 15–45)
RBC Count, CSF: 12000 /mm3 — ABNORMAL HIGH
Segmented Neutrophils-CSF: 76 % — ABNORMAL HIGH (ref 0–6)
Tube #: 3
WBC, CSF: 11 /mm3 (ref 0–5)

## 2023-05-17 LAB — PROTEIN AND GLUCOSE, CSF
Glucose, CSF: 58 mg/dL (ref 40–70)
Total  Protein, CSF: 52 mg/dL — ABNORMAL HIGH (ref 15–45)

## 2023-05-17 MED ORDER — LIDOCAINE HCL (PF) 1 % IJ SOLN
5.0000 mL | Freq: Once | INTRAMUSCULAR | Status: AC
  Administered 2023-05-17: 5 mL via INTRADERMAL

## 2023-05-17 NOTE — Progress Notes (Signed)
Patient was given discharge instructions. She verbalized understanding.

## 2023-05-17 NOTE — Progress Notes (Signed)
Lab called with critical value that CSS: WBC 11. Results called to Alwyn Ren, NP.

## 2023-05-18 ENCOUNTER — Telehealth: Payer: Self-pay | Admitting: Neurology

## 2023-05-18 ENCOUNTER — Telehealth: Payer: Self-pay

## 2023-05-18 DIAGNOSIS — R269 Unspecified abnormalities of gait and mobility: Secondary | ICD-10-CM

## 2023-05-18 DIAGNOSIS — R2689 Other abnormalities of gait and mobility: Secondary | ICD-10-CM

## 2023-05-18 DIAGNOSIS — G912 (Idiopathic) normal pressure hydrocephalus: Secondary | ICD-10-CM

## 2023-05-18 LAB — CYTOLOGY - NON PAP

## 2023-05-18 NOTE — Telephone Encounter (Signed)
Called patient to discuss her lumbar puncture. She only had about 9 cc drained, but per PT and patient's wife, patient was walking much better and was more alert, which patient agreed with. Today, she has not gotten around much, but does not feel as well as she did yesterday.  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 She had a CT head from 01/01/23 which has an Evans Index of ~0.29 (possible or early ventriculomegaly) without significant atrophy.  After discussion, patient would like to pursue MRI brain to ensure no other pathology and be referred to neurosurgery to discuss their opinion on a possible shunt.  All questions were answered.  Jacquelyne Balint, MD [facility]

## 2023-05-18 NOTE — Telephone Encounter (Signed)
-----   Message from Antony Madura sent at 05/18/2023  1:08 PM EDT ----- Regarding: Orders Brynnley Dayrit,  Can you order an MRI brain with and without contrast for the patient for abnormal gait and imbalance?  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 She already sees Dr. Danielle Dess at [facility], so that should be fine.  Thank you,  Riki Rusk

## 2023-05-19 ENCOUNTER — Ambulatory Visit: Payer: Medicare PPO

## 2023-05-19 DIAGNOSIS — D229 Melanocytic nevi, unspecified: Secondary | ICD-10-CM | POA: Diagnosis not present

## 2023-05-19 DIAGNOSIS — D485 Neoplasm of uncertain behavior of skin: Secondary | ICD-10-CM | POA: Diagnosis not present

## 2023-05-19 DIAGNOSIS — L821 Other seborrheic keratosis: Secondary | ICD-10-CM | POA: Diagnosis not present

## 2023-05-19 DIAGNOSIS — L989 Disorder of the skin and subcutaneous tissue, unspecified: Secondary | ICD-10-CM | POA: Diagnosis not present

## 2023-05-19 DIAGNOSIS — L409 Psoriasis, unspecified: Secondary | ICD-10-CM | POA: Diagnosis not present

## 2023-05-19 DIAGNOSIS — C44319 Basal cell carcinoma of skin of other parts of face: Secondary | ICD-10-CM | POA: Diagnosis not present

## 2023-05-19 DIAGNOSIS — L814 Other melanin hyperpigmentation: Secondary | ICD-10-CM | POA: Diagnosis not present

## 2023-05-19 DIAGNOSIS — L578 Other skin changes due to chronic exposure to nonionizing radiation: Secondary | ICD-10-CM | POA: Diagnosis not present

## 2023-05-20 LAB — CSF CULTURE W GRAM STAIN

## 2023-05-21 ENCOUNTER — Other Ambulatory Visit (HOSPITAL_BASED_OUTPATIENT_CLINIC_OR_DEPARTMENT_OTHER): Payer: Self-pay | Admitting: Obstetrics & Gynecology

## 2023-05-21 ENCOUNTER — Ambulatory Visit: Payer: Medicare PPO

## 2023-05-21 ENCOUNTER — Other Ambulatory Visit (HOSPITAL_COMMUNITY): Payer: Self-pay

## 2023-05-21 DIAGNOSIS — E559 Vitamin D deficiency, unspecified: Secondary | ICD-10-CM

## 2023-05-24 ENCOUNTER — Other Ambulatory Visit: Payer: Self-pay

## 2023-05-24 ENCOUNTER — Encounter: Payer: Self-pay | Admitting: Neurology

## 2023-05-24 ENCOUNTER — Other Ambulatory Visit (HOSPITAL_COMMUNITY): Payer: Self-pay

## 2023-05-24 MED ORDER — VITAMIN D (ERGOCALCIFEROL) 1.25 MG (50000 UNIT) PO CAPS
ORAL_CAPSULE | ORAL | 2 refills | Status: DC
Start: 2023-05-24 — End: 2024-05-01
  Filled 2023-05-24: qty 6, fill #0
  Filled 2023-06-23: qty 6, 84d supply, fill #0
  Filled 2023-09-18: qty 6, 84d supply, fill #1
  Filled 2023-12-31: qty 6, 84d supply, fill #2

## 2023-05-25 ENCOUNTER — Encounter: Payer: Self-pay | Admitting: Neurology

## 2023-05-25 ENCOUNTER — Telehealth: Payer: Self-pay

## 2023-05-25 NOTE — Telephone Encounter (Signed)
Called pt and informed her of the referral to [facility] and if she would like for [place] to switch it to another provider. She was okay with [facility] she had not heard from them and I told her I would call them and check on it. She said she will do some research on other providers and let me know if for sure she would like to go with them.

## 2023-05-26 ENCOUNTER — Telehealth: Payer: Self-pay

## 2023-05-26 NOTE — Telephone Encounter (Signed)
-----   Message from [facility] [place] [facility] sent at 05/18/2023  1:58 PM EDT ----- Regarding: RE: Orders Referrals sent ----- Message ----- From: Antony Madura, MD Sent: 05/18/2023   1:10 PM EDT To: Leida Lauth, CMA Subject: Orders                                         Sheena,  Can you order an MRI brain with and without contrast for the patient for abnormal gait and imbalance?  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 She already sees Dr. Danielle Dess at [facility], so that should be fine.  Thank you,  Riki Rusk

## 2023-05-26 NOTE — Telephone Encounter (Signed)
Was done by [facility]

## 2023-05-26 NOTE — Telephone Encounter (Signed)
Called [facility] and they had not received referral. I re faxed.

## 2023-05-27 ENCOUNTER — Telehealth: Payer: Self-pay | Admitting: Neurology

## 2023-05-27 NOTE — Telephone Encounter (Signed)
Pt called in to give info on where she wants her referral to. She wants it sent to [facility]. Their fax is [phone] ATTN: Dept of Neurosurgery. She was told to have the MRI results sent through powershare. She stated she would prefer a call back so discuss.

## 2023-05-28 ENCOUNTER — Encounter: Payer: Self-pay | Admitting: Neurology

## 2023-05-28 ENCOUNTER — Ambulatory Visit
Admission: RE | Admit: 2023-05-28 | Discharge: 2023-05-28 | Disposition: A | Discharge status: Home / Self Care | Payer: Medicare PPO | Source: Ambulatory Visit | Attending: Neurology | Admitting: Neurology

## 2023-05-28 ENCOUNTER — Telehealth: Payer: Self-pay

## 2023-05-28 DIAGNOSIS — R2689 Other abnormalities of gait and mobility: Secondary | ICD-10-CM | POA: Diagnosis not present

## 2023-05-28 DIAGNOSIS — R269 Unspecified abnormalities of gait and mobility: Secondary | ICD-10-CM

## 2023-05-28 MED ORDER — GADOPICLENOL 0.5 MMOL/ML IV SOLN
5.0000 mL | Freq: Once | INTRAVENOUS | Status: AC | PRN
  Administered 2023-05-28: 5 mL via INTRAVENOUS

## 2023-05-28 NOTE — Telephone Encounter (Signed)
Records, Notes, Labs, imaging faxed to [facility] and [facility] as requested

## 2023-05-28 NOTE — Telephone Encounter (Signed)
Left message on machine for patient to call back.

## 2023-05-28 NOTE — Telephone Encounter (Signed)
Pt, called back and would like all records sent to [facility] and [facility]. I told her I would fax them for her. She understood.

## 2023-06-07 LAB — CULTURE, FUNGUS WITHOUT SMEAR

## 2023-06-14 ENCOUNTER — Other Ambulatory Visit: Payer: Self-pay | Admitting: Internal Medicine

## 2023-06-14 DIAGNOSIS — M50122 Cervical disc disorder at C5-C6 level with radiculopathy: Secondary | ICD-10-CM | POA: Diagnosis not present

## 2023-06-14 DIAGNOSIS — Z1231 Encounter for screening mammogram for malignant neoplasm of breast: Secondary | ICD-10-CM

## 2023-06-14 DIAGNOSIS — G912 (Idiopathic) normal pressure hydrocephalus: Secondary | ICD-10-CM | POA: Diagnosis not present

## 2023-06-15 ENCOUNTER — Ambulatory Visit: Payer: Medicare PPO

## 2023-06-15 ENCOUNTER — Other Ambulatory Visit (HOSPITAL_COMMUNITY): Payer: Self-pay

## 2023-06-15 NOTE — Progress Notes (Signed)
I saw ZAIRE OTANO in neurology clinic on 06/23/23 in follow up for falls and imbalance.  HPI: KELLYMARIE MORMAN is a 77 y.o. year old right-handed female with a medical history of hypothyroidism, insomnia, depression, anxiety, OA, osteoporosis, asthma, B12 deficiency who we last saw on 03/18/23.  To briefly review: Patient had low back surgery (L5-S1) about 3 years ago (at [facility]). She had extreme back pain radiating into her legs. Surgery helped with her symptoms. Her back pain returned in severity and she has been getting injections (at least 3 since). She had an intraspinal injection about 1.5 months ago. This has really helped with pain.    She has started to have imbalance and falls over the last couple of months. She will wake up and feel like she does not know where her feet are. Her balance will be off. It will come and go. She endorses falls as well. She will fall 3-4 times per week, particularly when out in her yard. She will feel disoriented and off balance then lower herself to the ground. She will feel like she is not picking up her feet and will trip. She has significant difficulty with balance when closing her eyes in the shower. She does not use an ambulatory device (cane or walker).    She does some difficulty with holding her urine. She has had a couple of accidents over the last week. She denies any changes in bowel. She denies saddle anesthesia. She is unsure when her last MRI lumbar spine was (maybe more recently than 2022, but not in last few months).   She endorses occasional cramps in her legs. She feels generally weak, including in the arms.   She denies significant neck pain. She occasionally gets some shooting pains in arms, but nothing significant.   The patient denies symptoms suggestive of oculobulbar weakness including diplopia, ptosis, dysphagia, poor saliva control, dysarthria/dysphonia, impaired mastication, facial  weakness/droop.   There are no neuromuscular respiratory weakness symptoms, particularly orthopnea>dyspnea.    She does not report any constitutional symptoms like fever, night sweats, anorexia or unintentional weight loss.   Patient has not done physical therapy recently.   Patient takes B12 injections every 2 weeks.   EtOH use: maybe 2 beers per week  Restrictive diet? No Family history of neuropathy/myopathy/neurologic disease? No   She has never had an EMG.   Of note, patient was seen in this office by Dr. Everlena Cooper in 2019 for cognitive issues and an incidental microbleed seen on MRI brain. Her MoCA at that time was 22/30 (missed 1 point on visuospatial/executive, 2 points on serial 7s and 5 points on delayed recall) per note. She was to have neuropsych testing, but the doctor set to do testing left prior to testing being completed.  Most recent Assessment and Plan (03/18/23): JAISE BUCHAN is a 77 y.o. female who presents for evaluation of imbalance and falls. She has a relevant medical history of hypothyroidism, insomnia, depression, anxiety, OA, osteoporosis, asthma, B12 deficiency. Her neurological examination is pertinent for diminished sensation to vibration and proprioception in distal lower extremities and positive Romberg. 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 Patient's symptoms of imbalance and falls are likely due to poor sensation in bilateral distal lower extremities. 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 I will get labs to look for treatable causes and get an EMG to further clarify. I will also send patient to PT to work on balance.   PLAN: -Blood work: B1, B12, HbA1c, IFE, vit E, copper -EMG: PN (R > L) -Will attempt to get records from  [facility] regarding any more recent MRIs of lumbar spine -PT for imbalance  Since their last visit: Labs showed no significant abnormalities. EMG showed residuals of olf right C8 and S1 radiculopathy, but no large fiber sensorimotor neuropathy.  MRI cervical and thoracic spine on 04/23/23 showed impingement of left C7 right C4-5 and C5-6 roots. Spinal canal was diffusely patent.   I recommended LP to evaluate for possible NPH. Opening pressure was 14 cm H2O (normal) with normal CSF studies (see below). Per patient and PT, patient was much improved in terms of walking after the LP. Given this, I discussed referral to NSGY for consideration of shunt for NPH. 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  She saw Dr. Danielle Dess and is getting a repeat lumbar puncture next week in the office [facility]. She also has an appointment with [facility] in 08/16/23.  She feels like her legs and balance are getting worse. She has not had an falls, but is having to use 2 walking sticks. She also mentions feeling like her head is going backwards.   Patient went to PT but is now doing home exercises.   MEDICATIONS:  Outpatient Encounter Medications as of 06/23/2023  Medication Sig   cyanocobalamin (VITAMIN B12) 1000 MCG/ML injection Inject 1 mL (1,000 mcg total) into the skin every 30 (thirty) days.   estrogen-methylTESTOSTERone (EST ESTROGENS-METHYLTEST HS) 0.625-1.25 MG tablet Take 1 tablet by mouth 3 (three) times a week.   fluticasone (FLOVENT HFA) 110 MCG/ACT inhaler Inhale 2 puffs into the lungs daily. Rinse mouth after each use.   gabapentin (NEURONTIN) 100 MG capsule Take 1 capsule (100 mg total) by mouth 3 (three) times daily.   levothyroxine (SYNTHROID) 75 MCG tablet TAKE 1 TABLET (75 MCG) BY MOUTH DAILY BEFORE BREAKFAST   Risankizumab-rzaa (SKYRIZI PEN) 150 MG/ML SOAJ Inject  subcutaneously once every 3 months   sertraline (ZOLOFT) 100 MG tablet TAKE 2 TABLETS BY MOUTH DAILY   traZODone (DESYREL) 150 MG tablet TAKE 1 TABLET BY MOUTH AT BEDTIME (Patient taking differently: Take 75 mg by mouth at bedtime. TAKE 1 TABLET BY MOUTH AT BEDTIME 1/2 table nightly)   TUBERCULIN SYR 1CC/25GX5/8" (SAFETY-LOK TB SYR 1CC/25GX5/8") 25G X 5/8" 1 ML MISC USE AS DIRECTED FOR B-12 INJECTIONS.   valACYclovir (VALTREX) 1000 MG tablet TAKE 2 TABLETS BY MOUTH EVERY 12 HOURS FOR 1 DAY AS NEEDED   Vitamin D, Ergocalciferol, (DRISDOL) 1.25 MG (50000 UNIT) CAPS capsule TAKE 1 CAPSULE BY MOUTH EVERY OTHER WEEK   amoxicillin-clavulanate (AUGMENTIN) 875-125 MG tablet Take 1 tablet by mouth 2 (two) times daily for 14 days. (Patient not taking: Reported on 06/23/2023)   No facility-administered encounter medications on file as of 06/23/2023.    PAST MEDICAL HISTORY: 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   Asthma    Depression    GERD (gastroesophageal reflux disease)    diet controlled, no med   History of endoscopy    HSV infection  IgA deficiency (HCC)    Low vitamin B12 level    Osteoporosis    Ovarian cyst    TAH/BSO   Plaque psoriasis    Shingles    Thyroid dysfunction     PAST SURGICAL HISTORY: Past Surgical History:  Procedure Laterality Date   broken arm Right    surgery for this- metal plates   BROW LIFT Bilateral 08/2013   COLONOSCOPY     LUMBAR DISC SURGERY  10/2020   L-4, L-5, S-1, Dr. Venetia Maxon   THYROIDECTOMY  2000   TOTAL ABDOMINAL HYSTERECTOMY W/ BILATERAL SALPINGOOPHORECTOMY  1999   UPPER GASTROINTESTINAL ENDOSCOPY      ALLERGIES: Allergies  Allergen Reactions   Gammagard S-D Less Iga [Immune Globulin (Human)] Anaphylaxis    Have to have triple washed blood products due to this allergy pt states   Clarithromycin Other (See Comments)    Severe leg and muscle cramps   Erythromycin  Base Nausea And Vomiting   Levaquin [Levofloxacin In D5w] Other (See Comments)    tendonitis   Other Rash and Other (See Comments)    IGA Deficiency - catches a lot of viruses   Sulfonamide Derivatives Rash   Tetracycline Rash    FAMILY HISTORY: Family History  Problem Relation Age of Onset   Heart attack Mother    Osteoporosis Mother    Lung cancer Mother    Colon polyps Mother    Arthritis Mother    COPD Father        chronic lung infections   Colon polyps Father    Colon cancer Neg Hx    Esophageal cancer Neg Hx    Stomach cancer Neg Hx    Rectal cancer Neg Hx    Breast cancer Neg Hx     SOCIAL HISTORY: Social History   Tobacco Use   Smoking status: Former    Current packs/day: 0.00    Average packs/day: 0.5 packs/day for 3.0 years (1.5 ttl pk-yrs)    Types: Cigarettes    Start date: 08/24/1965    Quit date: 08/24/1968    Years since quitting: 54.8   Smokeless tobacco: Never   Tobacco comments:    in college  Vaping Use   Vaping status: Never Used  Substance Use Topics   Alcohol use: Yes    Alcohol/week: 2.0 standard drinks of alcohol    Types: 2 Standard drinks or equivalent per week    Comment: 2 glasses a week   Drug use: No   Social History   Social History Narrative   Patient is right-handed. She lives with her wife in a split level house. She occasionally drinks coffee.   Are you right handed or left handed?    Are you currently employed ?    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 wife   What type of home do you live in: 1 story or 2 story? one        Objective:  Vital Signs:  BP 111/65   Pulse 70   Ht 5' (1.524 m)   Wt 116 lb (52.6 kg)   LMP 08/24/1997   SpO2 98%   BMI 22.65 kg/m   General: General appearance: Awake and alert. No distress. Cooperative with exam.  Skin: No obvious rash or jaundice. HEENT: Atraumatic. Anicteric. Lungs: Non-labored breathing on room  air  Extremities: No edema. Arthritic changes in bilateral hands and feet. Musculoskeletal:  No obvious joint swelling.  Neurological: Mental Status: Alert. Speech fluent. No pseudobulbar affect Cranial Nerves: CNII: No RAPD. Visual fields intact. CNIII, IV, VI: PERRL. No nystagmus. EOMI. CN V: Facial sensation intact bilaterally to fine touch. CN VII: Facial muscles symmetric and strong. No ptosis at rest. CN VIII: Hears finger rub well bilaterally. CN IX: No hypophonia. CN X: Palate elevates symmetrically. CN XI: Full strength shoulder shrug bilaterally. CN XII: Tongue protrusion full and midline. No atrophy or fasciculations. No significant dysarthria Motor: Tone is normal. No tremor. No atrophy. Strength is 5/5 in bilateral upper and lower extremities. Reflexes:  Right Left  Bicep 2+ 2+  Tricep 2+ 2+  BrRad 2+ 2+  Knee 2+ 2+  Ankle 2+ 2+   Sensation: Pinprick: Intact in all extremities Vibration: Absent in bilateral great toes Proprioception: Present in bilateral great toes Coordination: Intact finger-to- nose-finger bilaterally. Romberg with sway. Finger tapping and toe tapping mildly slow bilaterally. Gait: Mildly wide based, slap gait. Imbalance with turns. No clear freezing. No clear en bloc turns.   Lab and Test Review: New results: 03/18/23: B1 wnl B12: 1363 HbA1c: 5.2 IFE: no M protein Copper wnl Vit E wnl  04/15/23: TSH wnl Vit D wnl Lipid panel: LDL 130  CSF (05/17/23): Opening pressure: 14 cm H2O. Walking markedly improved per patient and PT after LP Routine analysis: 12000 RBC, 11 WBC (normal when accounting for traumatic tap), protein 52, glucose 58 Cytology negative Culture and stain (bacterial and fungal): negative  EMG (04/05/23): NCV & EMG Findings: Extensive electrodiagnostic evaluation of the right upper and lower limbs shows: Right sural, superficial peroneal/fibular, median, ulnar, and radial sensory responses are within normal  limits. Right peroneal/fibular (EDB), tibial (AH), median (APB), and ulnar (ADM) motor responses are within normal limits. Right H reflex latency is within normal limits. Chronic motor axon loss changes without accompanying active denervation changes are seen in the right medial head of gastrocnemius, short head of biceps femoris, first dorsal interosseous, extensor indicis proprius, and cervical paraspinal (C7 level) muscles. Lumbosacral paraspinal muscles were deferred due to prior surgery.   Impression: This is an abnormal study. 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 No electrodiagnostic evidence of a large fiber sensorimotor neuropathy. No electrodiagnostic evidence of a right median mononeuropathy at or distal to the wrist (ie: carpal tunnel syndrome).  MRI cervical and thoracic spine wo contrast (04/23/23): FINDINGS: MRI CERVICAL SPINE FINDINGS   Alignment: Physiologic.   Vertebrae: No fracture, evidence of discitis, or bone lesion.   Cord: Normal signal and morphology.   Posterior Fossa, vertebral arteries, paraspinal tissues: Negative.   Disc levels:   C2-3: Unremarkable.   C3-4: Unremarkable.   C4-5: Disc narrowing with uncovertebral ridging eccentric to the right where there is moderate foraminal stenosis.   C5-6: Disc collapse with endplate and uncovertebral ridging eccentric to the right where there is advanced foraminal stenosis. Patent canal and left foramen.   C6-7: Disc narrowing and bulging with left paracentral protrusion contacting the ventral cord and likely affecting the left intradural C7 nerve root. There is continuation into the left foramen where there is moderate  stenosis negative facets.   C7-T1:Unremarkable.   MRI THORACIC SPINE FINDINGS   Alignment:  Physiologic.  Vertebrae: No fracture, evidence of discitis, or bone lesion.   Cord:  Normal signal and morphology.   Paraspinal and other soft tissues: Negative.   Disc levels:   Diffusely preserved disc height and hydration. No facet spurring and no neural impingement.   IMPRESSION: Cervical spine:   1. C6-7 left paracentral to foraminal herniation contacting the ventral cord and likely affecting the left C7 nerve root. 2. Degenerative right foraminal impingement at C4-5 and C5-6. 3. Diffusely patent spinal canal.   Thoracic spine:   Normal.  MRI brain w/wo contrast (05/28/23): FINDINGS: Brain: There is no evidence of an acute infarct, midline shift, or extra-axial fluid collection. Mild cerebral atrophy is without lobar predominance and is considered to be within normal limits for age. Scattered punctate T2 hyperintensities in the cerebral white matter are unchanged and nonspecific but compatible with minimal chronic small vessel ischemic disease, also considered to be within normal limits for age. A chronic hemorrhage in the right thalamus is unchanged from the prior MRI.   A 5 mm T1 hyperintense mass along the superior aspect of the third ventricle is unchanged and consistent with a colloid cyst without evidence of associated hydrocephalus. No suspicious brain parenchymal or meningeal enhancement is identified. A small developmental venous anomaly is incidentally noted in the left frontal lobe.   Vascular: Major intracranial vascular flow voids are preserved.   Skull and upper cervical spine: Unremarkable bone marrow signal.   Sinuses/Orbits: Unremarkable orbits. Mild right ethmoid air cell mucosal thickening. Clear mastoid air cells.   Other: None.   IMPRESSION: 1. No acute intracranial abnormality. 2. 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 3.  Chronic right thalamic hemorrhage.  Previously reviewed results: 02/18/22: CBC unremarkable CMP significant for low sodium (132) which is chronic Lipid panel: total cholesterol 173, LDL 93 TSH: 1.68 B12: 435 (lowest was 217 about 13 years ago) Vit D: wnl HbA1c: 5.3   ANA (09/16/21): negative   Imaging: Carotid ultrasound (02/23/23): Summary:  Right Carotid: There is no evidence of stenosis in the right ICA. The extracranial vessels were near-normal with only minimal wall thickening or plaque.   Left Carotid: Velocities in the left ICA are consistent with a 1-39% stenosis.   Vertebrals: Bilateral vertebral arteries demonstrate antegrade flow.  Subclavians: Normal flow hemodynamics were seen in bilateral subclavian arteries.    Holter monitor (02/08/23-02/12/23): Predominant rhythm was normal sinus rhythm with an average heart rate of 69 bpm and ranged from 48 to 114 bpm   Rare PACs, atrial couplets, atrial triplets   Rare PVC   CT head wo contrast (01/01/23): (for head trauma) FINDINGS: Brain: No acute territorial infarction or intracranial hemorrhage is visualized. Small hyperdense 6 mm nodule along the anterior third ventral consistent with small colloid cyst. The ventricles are stable in size.   Vascular: No hyperdense vessels.  Carotid vascular calcification   Skull: Normal. Negative for fracture or focal lesion.   Sinuses/Orbits: No acute finding.   Other: None   IMPRESSION: 1. No CT evidence for acute intracranial abnormality. 2. Stable 6 mm colloid cyst.   MRI lumbar spine w/wo contrast (12/31/20): FINDINGS: Segmentation:  5 lumbar type vertebral bodies.   Alignment:  No malalignment.   Vertebrae: No fracture or primary bone lesion. Mild discogenic endplate edema and enhancement at L5-S1 could contribute to low back pain.   Conus medullaris and cauda equina: Conus extends to the L1 level. Conus and cauda equina appear normal.   Paraspinal and other soft  tissues: Negative  Disc levels:   No abnormality from T11-12 through L1-2.   L2-3: Minimal disc bulge.  No stenosis.   L3-4: Minimal disc bulge.  No stenosis.   L4-5: Broad-based disc herniation more prominent towards the left. Mild facet and ligamentous hypertrophy. Narrowing of the lateral recesses left more than right. Either L5 nerve could be affected, more likely the left. L4 nerves appear to exit the foramina without gross compression.   L5-S1: Disc degeneration with loss of disc height. Shallow protrusion of the disc more prominent towards the left. Mild facet and ligamentous hypertrophy. Mild stenosis of both subarticular lateral recesses and neural foramina.   IMPRESSION: L4-5: Shallow disc herniation more prominent towards the left. Facet and ligamentous hypertrophy. Stenosis of the lateral recesses left more than right. Neural compression could occur at this level, particularly on the left. This has worsened since 2016.   L5-S1: Endplate osteophytes and shallow disc protrusion. Facet and ligamentous hypertrophy. Narrowing of both subarticular lateral recesses and foramina which could possibly cause neural compression, particularly in the lateral recesses. Slight worsening since 2016.   MRI brain w/wo contrast (01/04/18 - external): I reviewed images and agree with findings below.     ASSESSMENT: This is [name], a 77 y.o. female with imbalance and falls. Her examination shows gait imbalance and diminished sensation to vibration in her feet with intact strength in all extremities. Her MRI brain, cervical spine, and thoracic spine do not show pathology to explain her imbalance. MRI lumbar spine shows some neural foraminal stenosis most prominent at L5. EMG showed only the residuals of an old right C8 and S1 radiculopathy. 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 Very little  CSF was drained though, so this will be rechecked next week, which makes a lot of sense. There is not clear evidence of parkinsonism, but this is a consideration. Large fiber neuropathy was a consideration given the diminished vibratory sense, but EMG did not show this. Small fiber neuropathy is possible but is usually not vibratory but pin prick and temperature sensation loss.  Plan: -Repeat LP as planned at [facility] - patient to get results to me -Continue home PT exercises -May consider skin biopsy for SFN or phosphorylated alpha-synuclein pathology  Return to clinic in 3 months  Total time spent reviewing records, interview, history/exam, documentation, and coordination of care on day of encounter:  40 min  Jacquelyne Balint, MD

## 2023-06-16 ENCOUNTER — Encounter: Payer: Self-pay | Admitting: Internal Medicine

## 2023-06-16 DIAGNOSIS — G912 (Idiopathic) normal pressure hydrocephalus: Secondary | ICD-10-CM | POA: Diagnosis not present

## 2023-06-16 NOTE — Progress Notes (Unsigned)
Subjective:    Patient ID: Jo Gomez, female    DOB: 20-Jan-1946, 77 y.o.   MRN: [id number]     HPI Emiko is here for follow up of her chronic medical problems.  Has been seeing neurosugery here and at [facility], also neurology.  Has had multiple MRI's - last MRI of brain.  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  Had lumbar puncture - 9 cc removed and after was walking much better and was more alert.    Saw [facility] a few days ago - discussed treatment options for NPH- has appt Dr Gavin Pound in Dec to discuss possible shunt for NPH.   Saw Dr Danielle Dess - would like to do lumbar puncture again  - will do that soon.    To see [facility] for their opinion.   ? Anything else that could be causing her symptoms  Had recent sinus infection - took abx and it is better.  Not sure if it is completed gone or not.       Medications and allergies reviewed with patient and updated if appropriate.  Current Outpatient Medications on File Prior to Visit  Medication Sig Dispense Refill   cyanocobalamin (VITAMIN B12) 1000 MCG/ML injection Inject 1 mL (1,000 mcg total) into the skin every 30 (thirty) days. 3 mL 1   estrogen-methylTESTOSTERone (EST ESTROGENS-METHYLTEST HS) 0.625-1.25 MG tablet Take 1 tablet by mouth 3 (three) times a week. 36 tablet 3   fluticasone (FLOVENT HFA) 110 MCG/ACT inhaler Inhale 2 puffs into the lungs daily. Rinse mouth after each use. 12 g 2   gabapentin (NEURONTIN) 100 MG capsule Take 1 capsule (100 mg total) by mouth 3 (three) times daily. 90 capsule 11   levothyroxine (SYNTHROID) 75 MCG tablet TAKE 1 TABLET (75 MCG) BY MOUTH DAILY BEFORE BREAKFAST 90 tablet 3   Risankizumab-rzaa (SKYRIZI PEN) 150 MG/ML SOAJ Inject subcutaneously once every 3 months 1 mL 3   sertraline (ZOLOFT) 100 MG tablet TAKE 2 TABLETS BY MOUTH DAILY 180 tablet 3   traZODone (DESYREL) 150 MG tablet TAKE 1 TABLET BY MOUTH AT BEDTIME 90 tablet 3   TUBERCULIN SYR 1CC/25GX5/8" (SAFETY-LOK TB SYR  1CC/25GX5/8") 25G X 5/8" 1 ML MISC USE AS DIRECTED FOR B-12 INJECTIONS. 20 each 3   valACYclovir (VALTREX) 1000 MG tablet TAKE 2 TABLETS BY MOUTH EVERY 12 HOURS FOR 1 DAY AS NEEDED 20 tablet 3   Vitamin D, Ergocalciferol, (DRISDOL) 1.25 MG (50000 UNIT) CAPS capsule TAKE 1 CAPSULE BY MOUTH EVERY OTHER WEEK 6 capsule 2   No current facility-administered medications on file prior to visit.     Review of Systems     Objective:   Vitals:   06/17/23 1112  BP: 120/62  Pulse: 68  Temp: 98.2 F (36.8 C)  SpO2: 93%   BP Readings from Last 3 Encounters:  06/17/23 120/62  05/17/23 (!) 148/76  04/27/23 (!) 114/55   Wt Readings from Last 3 Encounters:  06/17/23 119 lb (54 kg)  05/17/23 112 lb (50.8 kg)  04/12/23 117 lb 8 oz (53.3 kg)   Body mass index is 23.24 kg/m.    Physical Exam Constitutional:      General: She is not in acute distress.    Appearance: Normal appearance. She is not ill-appearing.  HENT:     Head: Normocephalic and atraumatic.  Skin:    General: Skin is warm and dry.  Neurological:     Mental Status: She is alert.  Comments: Very unstable getting up and walking  Psychiatric:        Mood and Affect: Mood normal.        Behavior: Behavior normal.        Lab Results  Component Value Date   WBC 6.6 04/15/2023   HGB 13.5 04/15/2023   HCT 39.8 04/15/2023   PLT 234.0 04/15/2023   GLUCOSE 89 04/15/2023   CHOL 204 (H) 04/15/2023   TRIG 71.0 04/15/2023   HDL 59.90 04/15/2023   LDLDIRECT 114.9 08/07/2013   LDLCALC 130 (H) 04/15/2023   ALT 37 (H) 04/15/2023   AST 33 04/15/2023   NA 132 (L) 04/15/2023   K 3.9 04/15/2023   CL 97 04/15/2023   CREATININE 0.84 04/15/2023   BUN 15 04/15/2023   CO2 28 04/15/2023   TSH 0.88 04/15/2023   HGBA1C 5.2 03/18/2023     Assessment & Plan:    See Problem List for Assessment and Plan of chronic medical problems.

## 2023-06-17 ENCOUNTER — Ambulatory Visit: Payer: Medicare PPO | Admitting: Internal Medicine

## 2023-06-17 ENCOUNTER — Other Ambulatory Visit (HOSPITAL_COMMUNITY): Payer: Self-pay

## 2023-06-17 VITALS — BP 120/62 | HR 68 | Temp 98.2°F | Ht 60.0 in | Wt 119.0 lb

## 2023-06-17 DIAGNOSIS — F3289 Other specified depressive episodes: Secondary | ICD-10-CM

## 2023-06-17 DIAGNOSIS — G912 (Idiopathic) normal pressure hydrocephalus: Secondary | ICD-10-CM | POA: Insufficient documentation

## 2023-06-17 DIAGNOSIS — J019 Acute sinusitis, unspecified: Secondary | ICD-10-CM | POA: Diagnosis not present

## 2023-06-17 MED ORDER — AMOXICILLIN-POT CLAVULANATE 875-125 MG PO TABS
1.0000 | ORAL_TABLET | Freq: Two times a day (BID) | ORAL | 0 refills | Status: AC
  Filled 2023-06-17: qty 28, 14d supply, fill #0

## 2023-06-17 NOTE — Assessment & Plan Note (Signed)
Subacute Had recent infection  Took abx x 10 days  Has IgA def and may need additional abx  Augmentin 875-125 mg bid x 14 days sent to pharmacy to take if needed

## 2023-06-17 NOTE — Assessment & Plan Note (Signed)
Symptoms suggestive of NPH -- needs to confirm with additional LP  - to be done soon by Dr Danielle Dess ? Consider shunt

## 2023-06-17 NOTE — Assessment & Plan Note (Signed)
Chronic Somewhat controlled - given circumstances Will continue sertraline 200 mg daily

## 2023-06-21 DIAGNOSIS — L409 Psoriasis, unspecified: Secondary | ICD-10-CM | POA: Diagnosis not present

## 2023-06-23 ENCOUNTER — Other Ambulatory Visit (HOSPITAL_COMMUNITY): Payer: Self-pay

## 2023-06-23 ENCOUNTER — Ambulatory Visit: Payer: Medicare PPO | Admitting: Neurology

## 2023-06-23 ENCOUNTER — Encounter: Payer: Self-pay | Admitting: Neurology

## 2023-06-23 VITALS — BP 111/65 | HR 70 | Ht 60.0 in | Wt 116.0 lb

## 2023-06-23 DIAGNOSIS — R200 Anesthesia of skin: Secondary | ICD-10-CM

## 2023-06-23 DIAGNOSIS — G912 (Idiopathic) normal pressure hydrocephalus: Secondary | ICD-10-CM

## 2023-06-23 DIAGNOSIS — R296 Repeated falls: Secondary | ICD-10-CM | POA: Diagnosis not present

## 2023-06-23 DIAGNOSIS — M5412 Radiculopathy, cervical region: Secondary | ICD-10-CM | POA: Diagnosis not present

## 2023-06-23 DIAGNOSIS — M5417 Radiculopathy, lumbosacral region: Secondary | ICD-10-CM | POA: Diagnosis not present

## 2023-06-23 DIAGNOSIS — R2689 Other abnormalities of gait and mobility: Secondary | ICD-10-CM

## 2023-06-23 DIAGNOSIS — R269 Unspecified abnormalities of gait and mobility: Secondary | ICD-10-CM

## 2023-06-23 DIAGNOSIS — R202 Paresthesia of skin: Secondary | ICD-10-CM | POA: Diagnosis not present

## 2023-06-23 NOTE — Patient Instructions (Signed)
Repeat the lumbar puncture as planned next week. Please share those results with me.  Continue home PT exercises  Depending on the lumbar puncture, we will consider further work up such as skin biopsy.  I will see you back in clinic in 3 months. Please let me know if you have any questions or concerns in the meantime.   The physicians and staff at [facility] are committed to providing excellent care. You may receive a survey requesting feedback about your experience at our office. We strive to receive "very good" responses to the survey questions. If you feel that your experience would prevent you from giving the office a "very good " response, please contact our office to try to remedy the situation. We may be reached at [phone]. Thank you for taking the time out of your busy day to complete the survey.  Jacquelyne Balint, MD [facility]  Preventing Falls at [facility] are common, often dreaded events in the lives of older people. Aside from the obvious injuries and even death that may result, fall can cause wide-ranging consequences including loss of independence, mental decline, decreased activity and mobility. Younger people are also at risk of falling, especially those with chronic illnesses and fatigue.  Ways to reduce risk for falling Examine diet and medications. Warm foods and alcohol dilate blood vessels, which can lead to dizziness when standing. Sleep aids, antidepressants and pain medications can also increase the likelihood of a fall.  Get a vision exam. Poor vision, cataracts and glaucoma increase the chances of falling.  Check foot gear. Shoes should fit snugly and have a sturdy, nonskid sole and a broad, low heel  Participate in a physician-approved exercise program to build and maintain muscle strength and improve balance and coordination. Programs that use ankle weights or stretch bands are excellent for muscle-strengthening. Water aerobics programs and  low-impact Tai Chi programs have also been shown to improve balance and coordination.  Increase vitamin D intake. Vitamin D improves muscle strength and increases the amount of calcium the body is able to absorb and deposit in bones.  How to prevent falls from common hazards Floors - Remove all loose wires, cords, and throw rugs. Minimize clutter. Make sure rugs are anchored and smooth. Keep furniture in its usual place.  Chairs -- Use chairs with straight backs, armrests and firm seats. Add firm cushions to existing pieces to add height.  Bathroom - Install grab bars and non-skid tape in the tub or shower. Use a bathtub transfer bench or a shower chair with a back support Use an elevated toilet seat and/or safety rails to assist standing from a low surface. Do not use towel racks or bathroom tissue holders to help you stand.  Lighting - Make sure halls, stairways, and entrances are well-lit. Install a night light in your bathroom or hallway. Make sure there is a light switch at the top and bottom of the staircase. Turn lights on if you get up in the middle of the night. Make sure lamps or light switches are within reach of the bed if you have to get up during the night.  Kitchen - Install non-skid rubber mats near the sink and stove. Clean spills immediately. Store frequently used utensils, pots, pans between waist and eye level. This helps prevent reaching and bending. Sit when getting things out of lower cupboards.  Living room/ Bedrooms - Place furniture with wide spaces in between, giving enough room to move around. Establish a route through  the living room that gives you something to hold onto as you walk.  Stairs - Make sure treads, rails, and rugs are secure. Install a rail on both sides of the stairs. If stairs are a threat, it might be helpful to arrange most of your activities on the lower level to reduce the number of times you must climb the stairs.  Entrances and doorways - Install  metal handles on the walls adjacent to the doorknobs of all doors to make it more secure as you travel through the doorway.  Tips for maintaining balance Keep at least one hand free at all times. Try using a backpack or fanny pack to hold things rather than carrying them in your hands. Never carry objects in both hands when walking as this interferes with keeping your balance.  Attempt to swing both arms from front to back while walking. This might require a conscious effort if Parkinson's disease has diminished your movement. It will, however, help you to maintain balance and posture, and reduce fatigue.  Consciously lift your feet off of the ground when walking. Shuffling and dragging of the feet is a common culprit in losing your balance.  When trying to navigate turns, use a "U" technique of facing forward and making a wide turn, rather than pivoting sharply.  Try to stand with your feet shoulder-length apart. When your feet are close together for any length of time, you increase your risk of losing your balance and falling.  Do one thing at a time. Don't try to walk and accomplish another task, such as reading or looking around. The decrease in your automatic reflexes complicates motor function, so the less distraction, the better.  Do not wear rubber or gripping soled shoes, they might "catch" on the floor and cause tripping.  Move slowly when changing positions. Use deliberate, concentrated movements and, if needed, use a grab bar or walking aid. Count 15 seconds between each movement. For example, when rising from a seated position, wait 15 seconds after standing to begin walking.  If balance is a continuous problem, you might want to consider a walking aid such as a cane, walking stick, or walker. Once you've mastered walking with help, you might be ready to try it on your own again.

## 2023-06-25 DIAGNOSIS — G912 (Idiopathic) normal pressure hydrocephalus: Secondary | ICD-10-CM | POA: Diagnosis not present

## 2023-06-26 ENCOUNTER — Encounter: Payer: Self-pay | Admitting: Neurology

## 2023-06-28 ENCOUNTER — Other Ambulatory Visit (HOSPITAL_COMMUNITY): Payer: Self-pay

## 2023-06-28 DIAGNOSIS — C44319 Basal cell carcinoma of skin of other parts of face: Secondary | ICD-10-CM | POA: Diagnosis not present

## 2023-07-06 ENCOUNTER — Encounter: Payer: Self-pay | Admitting: Internal Medicine

## 2023-07-09 ENCOUNTER — Other Ambulatory Visit (HOSPITAL_COMMUNITY): Payer: Self-pay

## 2023-07-09 DIAGNOSIS — G912 (Idiopathic) normal pressure hydrocephalus: Secondary | ICD-10-CM | POA: Diagnosis not present

## 2023-07-09 MED ORDER — CEFDINIR 300 MG PO CAPS
300.0000 mg | ORAL_CAPSULE | Freq: Two times a day (BID) | ORAL | 0 refills | Status: AC
  Filled 2023-07-09: qty 42, 21d supply, fill #0

## 2023-07-12 ENCOUNTER — Ambulatory Visit: Payer: Medicare PPO | Admitting: Internal Medicine

## 2023-07-12 ENCOUNTER — Ambulatory Visit
Admission: RE | Admit: 2023-07-12 | Discharge: 2023-07-12 | Disposition: A | Discharge status: Home / Self Care | Payer: Medicare PPO | Source: Ambulatory Visit

## 2023-07-12 DIAGNOSIS — Z1231 Encounter for screening mammogram for malignant neoplasm of breast: Secondary | ICD-10-CM | POA: Diagnosis not present

## 2023-07-19 DIAGNOSIS — Z1231 Encounter for screening mammogram for malignant neoplasm of breast: Secondary | ICD-10-CM

## 2023-07-19 DIAGNOSIS — G912 (Idiopathic) normal pressure hydrocephalus: Secondary | ICD-10-CM | POA: Diagnosis not present

## 2023-07-20 DIAGNOSIS — Z5189 Encounter for other specified aftercare: Secondary | ICD-10-CM | POA: Diagnosis not present

## 2023-07-21 DIAGNOSIS — H2513 Age-related nuclear cataract, bilateral: Secondary | ICD-10-CM | POA: Diagnosis not present

## 2023-07-21 DIAGNOSIS — H353131 Nonexudative age-related macular degeneration, bilateral, early dry stage: Secondary | ICD-10-CM | POA: Diagnosis not present

## 2023-07-22 ENCOUNTER — Other Ambulatory Visit: Payer: Self-pay | Admitting: Internal Medicine

## 2023-07-23 ENCOUNTER — Other Ambulatory Visit: Payer: Self-pay

## 2023-07-26 ENCOUNTER — Other Ambulatory Visit (HOSPITAL_BASED_OUTPATIENT_CLINIC_OR_DEPARTMENT_OTHER): Payer: Self-pay | Admitting: Obstetrics & Gynecology

## 2023-07-26 ENCOUNTER — Encounter (HOSPITAL_BASED_OUTPATIENT_CLINIC_OR_DEPARTMENT_OTHER): Payer: Self-pay | Admitting: Obstetrics & Gynecology

## 2023-07-26 ENCOUNTER — Encounter: Payer: Self-pay | Admitting: Internal Medicine

## 2023-07-26 DIAGNOSIS — M85851 Other specified disorders of bone density and structure, right thigh: Secondary | ICD-10-CM

## 2023-07-26 DIAGNOSIS — Z9229 Personal history of other drug therapy: Secondary | ICD-10-CM

## 2023-07-26 MED ORDER — ALENDRONATE SODIUM 70 MG PO TABS
70.0000 mg | ORAL_TABLET | ORAL | 3 refills | Status: DC
  Filled 2023-07-26: qty 12, 84d supply, fill #0
  Filled 2023-10-15: qty 12, 84d supply, fill #1
  Filled 2024-01-17: qty 12, 84d supply, fill #2
  Filled 2024-04-12: qty 12, 84d supply, fill #3

## 2023-07-26 MED ORDER — EST ESTROGENS-METHYLTEST HS 0.625-1.25 MG PO TABS
1.0000 | ORAL_TABLET | ORAL | 1 refills | Status: DC
  Filled 2023-07-26: qty 36, 84d supply, fill #0

## 2023-07-27 ENCOUNTER — Other Ambulatory Visit (HOSPITAL_COMMUNITY): Payer: Self-pay

## 2023-07-28 ENCOUNTER — Other Ambulatory Visit: Payer: Self-pay | Admitting: Internal Medicine

## 2023-07-28 ENCOUNTER — Other Ambulatory Visit (HOSPITAL_COMMUNITY): Payer: Self-pay

## 2023-07-29 ENCOUNTER — Other Ambulatory Visit (HOSPITAL_COMMUNITY): Payer: Self-pay

## 2023-07-29 MED ORDER — CYANOCOBALAMIN 1000 MCG/ML IJ SOLN
1000.0000 ug | INTRAMUSCULAR | 1 refills | Status: DC
  Filled 2023-07-29: qty 3, 90d supply, fill #0
  Filled 2023-09-18 – 2023-10-07 (×4): qty 3, 90d supply, fill #1

## 2023-07-30 ENCOUNTER — Other Ambulatory Visit (HOSPITAL_COMMUNITY): Payer: Self-pay

## 2023-08-02 DIAGNOSIS — G912 (Idiopathic) normal pressure hydrocephalus: Secondary | ICD-10-CM | POA: Diagnosis not present

## 2023-08-13 ENCOUNTER — Other Ambulatory Visit (HOSPITAL_COMMUNITY): Payer: Self-pay

## 2023-08-13 ENCOUNTER — Encounter: Payer: Self-pay | Admitting: Internal Medicine

## 2023-08-13 ENCOUNTER — Ambulatory Visit: Payer: Medicare PPO | Admitting: Internal Medicine

## 2023-08-13 VITALS — BP 126/70 | HR 72 | Temp 97.6°F | Ht 60.0 in | Wt 118.0 lb

## 2023-08-13 DIAGNOSIS — H6122 Impacted cerumen, left ear: Secondary | ICD-10-CM | POA: Insufficient documentation

## 2023-08-13 DIAGNOSIS — H9202 Otalgia, left ear: Secondary | ICD-10-CM | POA: Diagnosis not present

## 2023-08-13 DIAGNOSIS — G4451 Hemicrania continua: Secondary | ICD-10-CM | POA: Diagnosis not present

## 2023-08-13 DIAGNOSIS — H60332 Swimmer's ear, left ear: Secondary | ICD-10-CM | POA: Insufficient documentation

## 2023-08-13 DIAGNOSIS — H6502 Acute serous otitis media, left ear: Secondary | ICD-10-CM | POA: Diagnosis not present

## 2023-08-13 DIAGNOSIS — H6123 Impacted cerumen, bilateral: Secondary | ICD-10-CM | POA: Insufficient documentation

## 2023-08-13 MED ORDER — AMOXICILLIN-POT CLAVULANATE 875-125 MG PO TABS
1.0000 | ORAL_TABLET | Freq: Two times a day (BID) | ORAL | 0 refills | Status: AC
Start: 2023-08-13 — End: 2023-08-20
  Filled 2023-08-13: qty 14, 7d supply, fill #0

## 2023-08-13 MED ORDER — NEOMYCIN-POLYMYXIN-HC 3.5-10000-1 OT SUSP
3.0000 [drp] | Freq: Three times a day (TID) | OTIC | 0 refills | Status: DC
  Filled 2023-08-13: qty 10, 22d supply, fill #0

## 2023-08-13 NOTE — Patient Instructions (Signed)

## 2023-08-13 NOTE — Progress Notes (Unsigned)
Subjective:  Patient ID: Jo Gomez, female    DOB: 03-02-1946  Age: 77 y.o. MRN: [id number]  CC: Ear Pain   HPI Jo Gomez presents for f/up -----  Discussed the use of AI scribe software for clinical note transcription with the patient, who gave verbal consent to proceed.  History of Present Illness   The patient, with a history of sinus infections and IgA deficiency, presents with a three-day history of sharp, shooting ear pain. She denies any associated hearing loss. The pain radiates to the area below the ear, which is tender to touch. She has been experiencing a new type of headache for the past two days, different from her usual eye strain headaches, which is located in the upper part of the head. She also reports a normal cough in the morning with expectoration of clear to cloudy sputum. She denies any fever, chills, dyspnea, or throat pain, but notes occasional pain on swallowing.  The patient has a history of wax build-up in the ears and has attempted home remedies such as oil and syringe flushing. She recently stopped taking Skyrizi due to recurrent sinus infections. 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  The patient was on antibiotics for three weeks recently, presumably for a sinus infection, and does not believe she currently has a sinus infection. She attempted to alleviate her ear symptoms with Zyrtec without noticeable improvement.       Outpatient Medications Prior to Visit  Medication Sig Dispense Refill   alendronate (FOSAMAX) 70 MG tablet Take 1 tablet (70 mg total) by mouth every 7 (seven) days. Take first thing in am with 6 oz. Water.  Be upright after taking.  Eat nothing for one hour. 12 tablet 3   cyanocobalamin (VITAMIN B12) 1000 MCG/ML injection Inject 1 mL (1,000 mcg total) into the skin every 30 (thirty) days. 3 mL 1   estrogen-methylTESTOSTERone (EST  ESTROGENS-METHYLTEST HS) 0.625-1.25 MG tablet Take 1 tablet by mouth 3 (three) times a week. 36 tablet 1   fluticasone (FLOVENT HFA) 110 MCG/ACT inhaler Inhale 2 puffs into the lungs daily. Rinse mouth after each use. 12 g 2   gabapentin (NEURONTIN) 100 MG capsule Take 1 capsule (100 mg total) by mouth 3 (three) times daily. 90 capsule 11   levothyroxine (SYNTHROID) 75 MCG tablet TAKE 1 TABLET (75 MCG) BY MOUTH DAILY BEFORE BREAKFAST 90 tablet 3   sertraline (ZOLOFT) 100 MG tablet TAKE 2 TABLETS BY MOUTH DAILY 180 tablet 3   traZODone (DESYREL) 150 MG tablet TAKE 1 TABLET BY MOUTH AT BEDTIME (Patient taking differently: Take 75 mg by mouth at bedtime. TAKE 1 TABLET BY MOUTH AT BEDTIME 1/2 table nightly) 90 tablet 3   TUBERCULIN SYR 1CC/25GX5/8" (SAFETY-LOK TB SYR 1CC/25GX5/8") 25G X 5/8" 1 ML MISC USE AS DIRECTED FOR B-12 INJECTIONS. 20 each 3   valACYclovir (VALTREX) 1000 MG tablet TAKE 2 TABLETS BY MOUTH EVERY 12 HOURS FOR 1 DAY AS NEEDED 20 tablet 3   Vitamin D, Ergocalciferol, (DRISDOL) 1.25 MG (50000 UNIT) CAPS capsule TAKE 1 CAPSULE BY MOUTH EVERY OTHER WEEK 6 capsule 2   Risankizumab-rzaa (SKYRIZI PEN) 150 MG/ML SOAJ Inject subcutaneously once every 3 months (Patient not taking: Reported on 08/13/2023) 1 mL 3   No facility-administered medications prior to visit.    ROS Review of Systems  HENT:  Positive for ear pain. Negative for congestion, ear discharge, facial swelling, hearing  loss, postnasal drip, sinus pressure, sore throat and trouble swallowing.   Respiratory: Negative.  Negative for cough, chest tightness, shortness of breath and wheezing.   Cardiovascular:  Negative for chest pain, palpitations and leg swelling.  Gastrointestinal:  Negative for abdominal pain, constipation, diarrhea, nausea and vomiting.  Genitourinary:  Negative for difficulty urinating.  Musculoskeletal:  Positive for gait problem.  Skin: Negative.   Neurological:  Positive for dizziness, weakness and  headaches (left frontal). Negative for seizures, facial asymmetry, speech difficulty and numbness.  Psychiatric/Behavioral: Negative.      Objective:  BP 126/70 (BP Location: Left Arm, Patient Position: Sitting, Cuff Size: Normal)   Pulse 72   Temp 97.6 F (36.4 C) (Temporal)   Ht 5' (1.524 m)   Wt 118 lb (53.5 kg)   LMP 08/24/1997   SpO2 99%   BMI 23.05 kg/m   BP Readings from Last 3 Encounters:  08/13/23 126/70  06/23/23 111/65  06/17/23 120/62    Wt Readings from Last 3 Encounters:  08/13/23 118 lb (53.5 kg)  06/23/23 116 lb (52.6 kg)  06/17/23 119 lb (54 kg)    Physical Exam Vitals reviewed.  Constitutional:      General: She is not in acute distress.    Appearance: She is ill-appearing. She is not toxic-appearing or diaphoretic.  HENT:     Right Ear: No decreased hearing noted. There is no impacted cerumen.     Left Ear: No decreased hearing noted. No drainage, swelling or tenderness. A middle ear effusion is present. There is impacted cerumen. No foreign body. No mastoid tenderness. No hemotympanum. Tympanic membrane is not injected, perforated, erythematous, retracted or bulging.     Ears:     Comments: Left pinna pull test is positive for pain We were not able to remove the cerumen    Mouth/Throat:     Mouth: Mucous membranes are moist.  Eyes:     General: No scleral icterus.    Pupils: Pupils are equal, round, and reactive to light.  Neck:     Vascular: No carotid bruit.  Cardiovascular:     Rate and Rhythm: Normal rate and regular rhythm.     Heart sounds: No murmur heard. Pulmonary:     Effort: Pulmonary effort is normal.     Breath sounds: No stridor. No wheezing, rhonchi or rales.  Abdominal:     General: Abdomen is flat.     Palpations: There is no mass.     Tenderness: There is no abdominal tenderness. There is no guarding.     Hernia: No hernia is present.  Musculoskeletal:        General: Normal range of motion.     Cervical back: Neck  supple. No tenderness.     Right lower leg: No edema.     Left lower leg: No edema.  Lymphadenopathy:     Cervical: No cervical adenopathy.  Skin:    General: Skin is warm and dry.     Findings: No lesion or rash.  Neurological:     General: No focal deficit present.     Mental Status: She is alert. Mental status is at baseline.     Cranial Nerves: Cranial nerves 2-12 are intact.     Sensory: Sensation is intact.     Motor: Weakness present.     Coordination: Romberg sign positive. Coordination abnormal.     Gait: Gait is intact.     Lab Results  Component Value Date   WBC  6.6 04/15/2023   HGB 13.5 04/15/2023   HCT 39.8 04/15/2023   PLT 234.0 04/15/2023   GLUCOSE 89 04/15/2023   CHOL 204 (H) 04/15/2023   TRIG 71.0 04/15/2023   HDL 59.90 04/15/2023   LDLDIRECT 114.9 08/07/2013   LDLCALC 130 (H) 04/15/2023   ALT 37 (H) 04/15/2023   AST 33 04/15/2023   NA 132 (L) 04/15/2023   K 3.9 04/15/2023   CL 97 04/15/2023   CREATININE 0.84 04/15/2023   BUN 15 04/15/2023   CO2 28 04/15/2023   TSH 0.88 04/15/2023   HGBA1C 5.2 03/18/2023    MM 3D SCREENING MAMMOGRAM BILATERAL BREAST Result Date: 07/14/2023 CLINICAL DATA:  Screening. EXAM: DIGITAL SCREENING BILATERAL MAMMOGRAM WITH TOMOSYNTHESIS AND CAD TECHNIQUE: Bilateral screening digital craniocaudal and mediolateral oblique mammograms were obtained. Bilateral screening digital breast tomosynthesis was performed. The images were evaluated with computer-aided detection. COMPARISON:  Previous exam(s). ACR Breast Density Category b: There are scattered areas of fibroglandular density. FINDINGS: There are no findings suspicious for malignancy. IMPRESSION: No mammographic evidence of malignancy. A result letter of this screening mammogram will be mailed directly to the patient. RECOMMENDATION: Screening mammogram in one year. (Code:SM-B-01Y) BI-RADS CATEGORY  1: Negative. Electronically Signed   By: Edwin Cap M.D.   On: 07/14/2023  12:27    Assessment & Plan:  Impacted cerumen of left ear -     Ambulatory referral to ENT  Acute swimmer's ear of left side -     Ambulatory referral to ENT -     Amoxicillin-Pot Clavulanate; Take 1 tablet by mouth 2 (two) times daily for 7 days.  Dispense: 14 tablet; Refill: 0 -     Neomycin-Polymyxin-HC; Place 3 drops into the left ear 3 (three) times daily.  Dispense: 10 mL; Refill: 0  Non-recurrent acute serous otitis media of left ear -     Ambulatory referral to ENT -     Amoxicillin-Pot Clavulanate; Take 1 tablet by mouth 2 (two) times daily for 7 days.  Dispense: 14 tablet; Refill: 0  Ear pain, left- Will evaluate for a structural lesion. 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 -     MR BRAIN W WO CONTRAST; Future     Follow-up: Return in about 3 weeks (around 09/03/2023), or if symptoms worsen or fail to improve.  Sanda Linger, MD

## 2023-08-14 ENCOUNTER — Encounter: Payer: Self-pay | Admitting: Internal Medicine

## 2023-08-15 ENCOUNTER — Other Ambulatory Visit: Payer: Self-pay | Admitting: Internal Medicine

## 2023-08-15 DIAGNOSIS — H60332 Swimmer's ear, left ear: Secondary | ICD-10-CM

## 2023-08-16 ENCOUNTER — Ambulatory Visit (INDEPENDENT_AMBULATORY_CARE_PROVIDER_SITE_OTHER): Payer: Medicare PPO | Admitting: Otolaryngology

## 2023-08-16 VITALS — Ht 60.0 in | Wt 114.0 lb

## 2023-08-16 DIAGNOSIS — H60332 Swimmer's ear, left ear: Secondary | ICD-10-CM

## 2023-08-16 DIAGNOSIS — H6122 Impacted cerumen, left ear: Secondary | ICD-10-CM | POA: Diagnosis not present

## 2023-08-16 DIAGNOSIS — H9202 Otalgia, left ear: Secondary | ICD-10-CM | POA: Diagnosis not present

## 2023-08-17 DIAGNOSIS — H9202 Otalgia, left ear: Secondary | ICD-10-CM | POA: Insufficient documentation
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
## 2023-08-20 ENCOUNTER — Other Ambulatory Visit: Payer: Medicare PPO

## 2023-08-23 ENCOUNTER — Other Ambulatory Visit: Payer: Self-pay | Admitting: Allergy

## 2023-08-23 ENCOUNTER — Other Ambulatory Visit (HOSPITAL_COMMUNITY): Payer: Self-pay

## 2023-08-23 ENCOUNTER — Institutional Professional Consult (permissible substitution) (INDEPENDENT_AMBULATORY_CARE_PROVIDER_SITE_OTHER): Payer: Medicare PPO

## 2023-08-23 ENCOUNTER — Other Ambulatory Visit: Payer: Self-pay

## 2023-08-26 ENCOUNTER — Other Ambulatory Visit (HOSPITAL_COMMUNITY): Payer: Self-pay

## 2023-08-27 ENCOUNTER — Other Ambulatory Visit: Payer: Self-pay

## 2023-08-27 ENCOUNTER — Other Ambulatory Visit: Payer: Self-pay | Admitting: Allergy

## 2023-08-27 ENCOUNTER — Encounter (HOSPITAL_BASED_OUTPATIENT_CLINIC_OR_DEPARTMENT_OTHER): Payer: Self-pay | Admitting: Obstetrics & Gynecology

## 2023-08-30 ENCOUNTER — Other Ambulatory Visit (HOSPITAL_COMMUNITY): Payer: Self-pay

## 2023-08-30 ENCOUNTER — Encounter: Payer: Self-pay | Admitting: Internal Medicine

## 2023-08-30 ENCOUNTER — Encounter (INDEPENDENT_AMBULATORY_CARE_PROVIDER_SITE_OTHER): Payer: Self-pay

## 2023-08-30 ENCOUNTER — Ambulatory Visit (INDEPENDENT_AMBULATORY_CARE_PROVIDER_SITE_OTHER): Payer: Medicare PPO | Admitting: Otolaryngology

## 2023-08-30 ENCOUNTER — Other Ambulatory Visit: Payer: Self-pay

## 2023-08-30 ENCOUNTER — Other Ambulatory Visit: Payer: Self-pay | Admitting: Internal Medicine

## 2023-08-30 VITALS — BP 151/83 | HR 68 | Ht 60.0 in | Wt 114.0 lb

## 2023-08-30 DIAGNOSIS — H9202 Otalgia, left ear: Secondary | ICD-10-CM

## 2023-08-30 DIAGNOSIS — H66012 Acute suppurative otitis media with spontaneous rupture of ear drum, left ear: Secondary | ICD-10-CM | POA: Insufficient documentation

## 2023-08-30 DIAGNOSIS — H60332 Swimmer's ear, left ear: Secondary | ICD-10-CM

## 2023-08-30 DIAGNOSIS — H7012 Chronic mastoiditis, left ear: Secondary | ICD-10-CM | POA: Insufficient documentation

## 2023-08-30 MED ORDER — NEOMYCIN-POLYMYXIN-HC 3.5-10000-1 OT SUSP
3.0000 [drp] | Freq: Three times a day (TID) | OTIC | 0 refills | Status: DC
  Filled 2023-08-30: qty 10, 14d supply, fill #0

## 2023-08-30 MED ORDER — CIPROFLOXACIN-DEXAMETHASONE 0.3-0.1 % OT SUSP: 4.0000 [drp] | Freq: Two times a day (BID) | OTIC | 3 refills | Status: AC

## 2023-08-31 ENCOUNTER — Other Ambulatory Visit (HOSPITAL_BASED_OUTPATIENT_CLINIC_OR_DEPARTMENT_OTHER): Payer: Self-pay | Admitting: Obstetrics & Gynecology

## 2023-08-31 DIAGNOSIS — Z9229 Personal history of other drug therapy: Secondary | ICD-10-CM

## 2023-08-31 MED ORDER — EST ESTROGENS-METHYLTEST HS 0.625-1.25 MG PO TABS: 1.0000 | ORAL_TABLET | ORAL | 1 refills | Status: DC

## 2023-08-31 NOTE — Progress Notes (Signed)
 Patient ID: Jo Gomez, female   DOB: [birth year], 78 y.o.   MRN: [id number]  Follow-up: Persistent left ear pain, left ear infection  HPI: The patient is a 78 year old female who returns today complaining of persistent left ear pain and recurrent left ear drainage.  She was last seen 2 weeks ago.  At that time, she was complaining of persistent left ear pain.  Left ear cerumen was removed.  Mild left ear canal and tympanic membrane edema was noted.  The patient returns today complaining of intermittent left ear drainage and persistent left ear pain.  She was treated with Cortisporin  eardrops with slight improvement in her symptoms.  The patient is scheduled to undergo a VP shunt procedure later this month.  She would like to have all infections treated prior to the procedure.  Exam: General: Communicates without difficulty, well nourished, no acute distress. Head: Normocephalic, no evidence injury, no tenderness, facial buttresses intact without stepoff. Face/sinus: No tenderness to palpation and percussion. Facial movement is normal and symmetric. Eyes: PERRL, EOMI. No scleral icterus, conjunctivae clear. Neuro: CN II exam reveals vision grossly intact.  No nystagmus at any point of gaze. Ears: Auricles well formed without lesions.  Drainage is noted within the left ear canal.  Under the operating microscope, the left ear canal is debrided with a suction catheter.  The left ear canal and tympanic membrane are edematous.  The right ear canal and tympanic membrane are normal.  Nose: External evaluation reveals normal support and skin without lesions.  Dorsum is intact.  Anterior rhinoscopy reveals congested mucosa over anterior aspect of inferior turbinates and intact septum.  No purulence noted. Oral:  Oral cavity and oropharynx are intact, symmetric, without erythema or edema.  Mucosa is moist without lesions. Neck: Full range of motion without pain.  There is no significant lymphadenopathy.  No masses  palpable.  Thyroid  bed within normal limits to palpation.  Parotid glands and submandibular glands equal bilaterally without mass.  Trachea is midline. Neuro:  CN 2-12 grossly intact.   Assessment: 1.  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 2.  Persistent left ear pain. 3.  No infection is noted on the right side.  Plan: 1.  Otomicroscopy with debridement of the left ear canal. 2.  Ciprodex  eardrops 4 drops left ear twice daily for 10 days. 3.  Temporal bone CT scan to evaluate for possible chronic mastoiditis. 4.  The patient will return for reevaluation after her CT scan.

## 2023-09-01 ENCOUNTER — Other Ambulatory Visit (HOSPITAL_BASED_OUTPATIENT_CLINIC_OR_DEPARTMENT_OTHER): Payer: Self-pay | Admitting: Obstetrics & Gynecology

## 2023-09-01 DIAGNOSIS — F418 Other specified anxiety disorders: Secondary | ICD-10-CM | POA: Diagnosis not present

## 2023-09-01 DIAGNOSIS — E039 Hypothyroidism, unspecified: Secondary | ICD-10-CM | POA: Diagnosis not present

## 2023-09-01 DIAGNOSIS — Z01818 Encounter for other preprocedural examination: Secondary | ICD-10-CM | POA: Diagnosis not present

## 2023-09-01 DIAGNOSIS — Z9229 Personal history of other drug therapy: Secondary | ICD-10-CM

## 2023-09-01 DIAGNOSIS — G912 (Idiopathic) normal pressure hydrocephalus: Secondary | ICD-10-CM | POA: Diagnosis not present

## 2023-09-01 DIAGNOSIS — J45909 Unspecified asthma, uncomplicated: Secondary | ICD-10-CM | POA: Diagnosis not present

## 2023-09-01 MED ORDER — EST ESTROGENS-METHYLTEST HS 0.625-1.25 MG PO TABS: 1.0000 | ORAL_TABLET | ORAL | 1 refills | Status: DC

## 2023-09-06 ENCOUNTER — Other Ambulatory Visit (HOSPITAL_COMMUNITY): Payer: Self-pay

## 2023-09-06 ENCOUNTER — Other Ambulatory Visit (HOSPITAL_BASED_OUTPATIENT_CLINIC_OR_DEPARTMENT_OTHER): Payer: Self-pay | Admitting: Obstetrics & Gynecology

## 2023-09-06 ENCOUNTER — Ambulatory Visit (HOSPITAL_BASED_OUTPATIENT_CLINIC_OR_DEPARTMENT_OTHER)
Admission: RE | Admit: 2023-09-06 | Discharge: 2023-09-06 | Disposition: A | Discharge status: Home / Self Care | Payer: Medicare PPO | Source: Ambulatory Visit | Attending: Otolaryngology | Admitting: Otolaryngology

## 2023-09-06 DIAGNOSIS — H7012 Chronic mastoiditis, left ear: Secondary | ICD-10-CM | POA: Diagnosis not present

## 2023-09-06 DIAGNOSIS — Z9229 Personal history of other drug therapy: Secondary | ICD-10-CM

## 2023-09-06 DIAGNOSIS — R519 Headache, unspecified: Secondary | ICD-10-CM | POA: Diagnosis not present

## 2023-09-06 DIAGNOSIS — H7090 Unspecified mastoiditis, unspecified ear: Secondary | ICD-10-CM | POA: Diagnosis not present

## 2023-09-07 ENCOUNTER — Other Ambulatory Visit (HOSPITAL_BASED_OUTPATIENT_CLINIC_OR_DEPARTMENT_OTHER): Payer: Self-pay | Admitting: Obstetrics & Gynecology

## 2023-09-07 ENCOUNTER — Other Ambulatory Visit (HOSPITAL_COMMUNITY): Payer: Self-pay

## 2023-09-07 DIAGNOSIS — Z9229 Personal history of other drug therapy: Secondary | ICD-10-CM

## 2023-09-07 MED ORDER — EST ESTROGENS-METHYLTEST HS 0.625-1.25 MG PO TABS: 1.0000 | ORAL_TABLET | ORAL | 1 refills | Status: DC

## 2023-09-08 ENCOUNTER — Telehealth (INDEPENDENT_AMBULATORY_CARE_PROVIDER_SITE_OTHER): Payer: Self-pay | Admitting: Otolaryngology

## 2023-09-08 NOTE — Telephone Encounter (Signed)
 Per Dr.Teoh ,I told patient that her Temporal bone CT did not show significant infection. She is Ok to have surgery.

## 2023-09-09 DIAGNOSIS — Z79890 Hormone replacement therapy (postmenopausal): Secondary | ICD-10-CM | POA: Diagnosis not present

## 2023-09-09 DIAGNOSIS — G930 Cerebral cysts: Secondary | ICD-10-CM | POA: Diagnosis not present

## 2023-09-09 DIAGNOSIS — Q046 Congenital cerebral cysts: Secondary | ICD-10-CM | POA: Diagnosis not present

## 2023-09-09 DIAGNOSIS — Z7983 Long term (current) use of bisphosphonates: Secondary | ICD-10-CM | POA: Diagnosis not present

## 2023-09-09 DIAGNOSIS — Z7951 Long term (current) use of inhaled steroids: Secondary | ICD-10-CM | POA: Diagnosis not present

## 2023-09-09 DIAGNOSIS — G912 (Idiopathic) normal pressure hydrocephalus: Secondary | ICD-10-CM | POA: Diagnosis not present

## 2023-09-09 DIAGNOSIS — Z982 Presence of cerebrospinal fluid drainage device: Secondary | ICD-10-CM | POA: Diagnosis not present

## 2023-09-09 HISTORY — PX: BRAIN SURGERY: SHX531

## 2023-09-10 ENCOUNTER — Other Ambulatory Visit (HOSPITAL_COMMUNITY): Payer: Self-pay

## 2023-09-10 MED ORDER — ACETAMINOPHEN 500 MG PO TABS
500.0000 mg | ORAL_TABLET | Freq: Four times a day (QID) | ORAL | 0 refills | Status: DC | PRN
  Filled 2023-09-10: qty 30, 8d supply, fill #0

## 2023-09-12 DIAGNOSIS — E860 Dehydration: Secondary | ICD-10-CM | POA: Diagnosis not present

## 2023-09-12 DIAGNOSIS — R159 Full incontinence of feces: Secondary | ICD-10-CM | POA: Diagnosis not present

## 2023-09-12 DIAGNOSIS — R6883 Chills (without fever): Secondary | ICD-10-CM | POA: Diagnosis not present

## 2023-09-12 DIAGNOSIS — E222 Syndrome of inappropriate secretion of antidiuretic hormone: Secondary | ICD-10-CM | POA: Diagnosis not present

## 2023-09-12 DIAGNOSIS — D802 Selective deficiency of immunoglobulin A [IgA]: Secondary | ICD-10-CM | POA: Diagnosis not present

## 2023-09-12 DIAGNOSIS — Z982 Presence of cerebrospinal fluid drainage device: Secondary | ICD-10-CM | POA: Diagnosis not present

## 2023-09-12 DIAGNOSIS — G9389 Other specified disorders of brain: Secondary | ICD-10-CM | POA: Diagnosis not present

## 2023-09-12 DIAGNOSIS — R9431 Abnormal electrocardiogram [ECG] [EKG]: Secondary | ICD-10-CM | POA: Diagnosis not present

## 2023-09-12 DIAGNOSIS — Z20822 Contact with and (suspected) exposure to covid-19: Secondary | ICD-10-CM | POA: Diagnosis not present

## 2023-09-12 DIAGNOSIS — J45909 Unspecified asthma, uncomplicated: Secondary | ICD-10-CM | POA: Diagnosis not present

## 2023-09-12 DIAGNOSIS — R251 Tremor, unspecified: Secondary | ICD-10-CM | POA: Diagnosis not present

## 2023-09-12 DIAGNOSIS — R519 Headache, unspecified: Secondary | ICD-10-CM | POA: Diagnosis not present

## 2023-09-12 DIAGNOSIS — E871 Hypo-osmolality and hyponatremia: Secondary | ICD-10-CM | POA: Diagnosis not present

## 2023-09-12 DIAGNOSIS — E039 Hypothyroidism, unspecified: Secondary | ICD-10-CM | POA: Diagnosis not present

## 2023-09-15 DIAGNOSIS — D802 Selective deficiency of immunoglobulin A [IgA]: Secondary | ICD-10-CM | POA: Diagnosis not present

## 2023-09-15 DIAGNOSIS — J45909 Unspecified asthma, uncomplicated: Secondary | ICD-10-CM | POA: Diagnosis not present

## 2023-09-15 DIAGNOSIS — E039 Hypothyroidism, unspecified: Secondary | ICD-10-CM | POA: Diagnosis not present

## 2023-09-15 DIAGNOSIS — G912 (Idiopathic) normal pressure hydrocephalus: Secondary | ICD-10-CM | POA: Diagnosis not present

## 2023-09-15 DIAGNOSIS — F32A Depression, unspecified: Secondary | ICD-10-CM | POA: Diagnosis not present

## 2023-09-15 DIAGNOSIS — H5310 Unspecified subjective visual disturbances: Secondary | ICD-10-CM | POA: Diagnosis not present

## 2023-09-15 DIAGNOSIS — F419 Anxiety disorder, unspecified: Secondary | ICD-10-CM | POA: Diagnosis not present

## 2023-09-15 DIAGNOSIS — K449 Diaphragmatic hernia without obstruction or gangrene: Secondary | ICD-10-CM | POA: Diagnosis not present

## 2023-09-15 DIAGNOSIS — Z48811 Encounter for surgical aftercare following surgery on the nervous system: Secondary | ICD-10-CM | POA: Diagnosis not present

## 2023-09-16 ENCOUNTER — Encounter: Payer: Self-pay | Admitting: Internal Medicine

## 2023-09-16 ENCOUNTER — Other Ambulatory Visit (HOSPITAL_COMMUNITY): Payer: Self-pay

## 2023-09-16 MED ORDER — CLONAZEPAM 0.5 MG PO TABS
0.5000 mg | ORAL_TABLET | Freq: Two times a day (BID) | ORAL | 1 refills | Status: DC | PRN
  Filled 2023-09-16: qty 60, 30d supply, fill #0

## 2023-09-16 NOTE — Addendum Note (Signed)
Addended by: Pincus Sanes on: 09/16/2023 08:53 PM   Modules accepted: Orders

## 2023-09-17 ENCOUNTER — Other Ambulatory Visit (HOSPITAL_COMMUNITY): Payer: Self-pay

## 2023-09-19 ENCOUNTER — Other Ambulatory Visit (HOSPITAL_COMMUNITY): Payer: Self-pay

## 2023-09-20 ENCOUNTER — Encounter: Payer: Self-pay | Admitting: Internal Medicine

## 2023-09-20 DIAGNOSIS — E871 Hypo-osmolality and hyponatremia: Secondary | ICD-10-CM | POA: Insufficient documentation

## 2023-09-20 NOTE — Progress Notes (Deleted)
Subjective:    Patient ID: Jo Gomez, female    DOB: [birth year], 78 y.o.   MRN: [id number]     HPI Kwynn is here for follow up from the hospital  Admitted 09/09/23 -  09/10/23 at [facility] for NPH  She has history of a third ventral colloid cyst.  She was admitted for her NPH after determining that her ambulation difficulty improved after lumbar puncture.  VPS Certas placement and septostomy, she tolerated the procedure well without complications.  She progressed well postop and on the day of discharge she had adequate pain control with oral pain medication.  She was ambulating independently and tolerating diet.  She was advised to take Tylenol and Peri-Colace.   Admitted 09/12/2023-09/14/2023 to [facility] for hyponatremia  She presented with generalized weakness, confusion, word finding difficulty, headaches, blurry vision and urinary symptoms.  She is found to have hyponatremia secondary to poor p.o. intake in the setting of recent surgery.  She was drinking a lot of water.  Sodium was 124 on admission.  Serum osmolality was 264 and urine osmolality was 31 with a urine sodium of 19.  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  Initially Zoloft and trazodone were held, but restarted once SIADH was removed less likely.  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  She was treated with IV normal saline.  Sodium slowly improved.  On day of discharge sodium was 133.  She was tolerating p.o.  Neurosurgery was consulted in the ED given her confusion, headaches with recent VP shunt placement.  CT head was as expected for postop status.  Neurosurgery felt symptoms were related to hyponatremia  Chronic medical problems stable-no changes Has follow-up with neurosurgery as an outpatient-09/23/2023  Has home PT, OT  With all of the above she has had increased anxiety-the second hospitalizations she spent 2 nights in the hospital and  was waking up alone and with almost nightmare events-she did not know where she was was planning on escaping.  She had alarms going off if she left the bed and had 2 difficult nurses to deal with.  I started her on low-dose clonazepam to help ease anxiety.     Medications and allergies reviewed with patient and updated if appropriate.  Current Outpatient Medications on File Prior to Visit  Medication Sig Dispense Refill   acetaminophen (TYLENOL) 500 MG tablet Take 1 tablet (500 mg total) by mouth every 6 (six) hours as needed for mild pain (1-3). 30 tablet 0   alendronate (FOSAMAX) 70 MG tablet Take 1 tablet (70 mg total) by mouth every 7 (seven) days. Take first thing in am with 6 oz. Water.  Be upright after taking.  Eat nothing for one hour. 12 tablet 3   clonazePAM (KLONOPIN) 0.5 MG tablet Take 1 tablet (0.5 mg total) by mouth 2 (two) times daily as needed for anxiety. 60 tablet 1   cyanocobalamin (VITAMIN B12) 1000 MCG/ML injection Inject 1 mL (1,000 mcg total) into the skin every 30 (thirty) days. 3 mL 1   estrogen-methylTESTOSTERone (EST ESTROGENS-METHYLTEST HS) 0.625-1.25 MG tablet Take 1 tablet by mouth 3 (three) times a week. 36 tablet 1   fluticasone (FLOVENT HFA) 110 MCG/ACT inhaler Inhale 2 puffs into the lungs daily. Rinse mouth after each use. 12 g 2   gabapentin (NEURONTIN) 100 MG capsule Take 1 capsule (100 mg total) by mouth 3 (three) times daily. 90 capsule 11  levothyroxine (SYNTHROID) 75 MCG tablet TAKE 1 TABLET (75 MCG) BY MOUTH DAILY BEFORE BREAKFAST 90 tablet 3   neomycin-polymyxin-hydrocortisone (CORTISPORIN) 3.5-10000-1 OTIC suspension Place 3 drops into the left ear 3 (three) times daily. 10 mL 0   Risankizumab-rzaa (SKYRIZI PEN) 150 MG/ML SOAJ Inject subcutaneously once every 3 months 1 mL 3   sertraline (ZOLOFT) 100 MG tablet TAKE 2 TABLETS BY MOUTH DAILY 180 tablet 3   traZODone (DESYREL) 150 MG tablet TAKE 1 TABLET BY MOUTH AT BEDTIME (Patient taking differently:  Take 75 mg by mouth at bedtime. TAKE 1 TABLET BY MOUTH AT BEDTIME 1/2 table nightly) 90 tablet 3   TUBERCULIN SYR 1CC/25GX5/8" (SAFETY-LOK TB SYR 1CC/25GX5/8") 25G X 5/8" 1 ML MISC USE AS DIRECTED FOR B-12 INJECTIONS. 20 each 3   valACYclovir (VALTREX) 1000 MG tablet TAKE 2 TABLETS BY MOUTH EVERY 12 HOURS FOR 1 DAY AS NEEDED 20 tablet 3   Vitamin D, Ergocalciferol, (DRISDOL) 1.25 MG (50000 UNIT) CAPS capsule TAKE 1 CAPSULE BY MOUTH EVERY OTHER WEEK 6 capsule 2   No current facility-administered medications on file prior to visit.     Review of Systems     Objective:  There were no vitals filed for this visit. BP Readings from Last 3 Encounters:  08/30/23 (!) 151/83  08/13/23 126/70  06/23/23 111/65   Wt Readings from Last 3 Encounters:  08/30/23 114 lb (51.7 kg)  08/16/23 114 lb (51.7 kg)  08/13/23 118 lb (53.5 kg)   There is no height or weight on file to calculate BMI.    Physical Exam     Lab Results  Component Value Date   WBC 6.6 04/15/2023   HGB 13.5 04/15/2023   HCT 39.8 04/15/2023   PLT 234.0 04/15/2023   GLUCOSE 89 04/15/2023   CHOL 204 (H) 04/15/2023   TRIG 71.0 04/15/2023   HDL 59.90 04/15/2023   LDLDIRECT 114.9 08/07/2013   LDLCALC 130 (H) 04/15/2023   ALT 37 (H) 04/15/2023   AST 33 04/15/2023   NA 132 (L) 04/15/2023   K 3.9 04/15/2023   CL 97 04/15/2023   CREATININE 0.84 04/15/2023   BUN 15 04/15/2023   CO2 28 04/15/2023   TSH 0.88 04/15/2023   HGBA1C 5.2 03/18/2023     Assessment & Plan:    See Problem List for Assessment and Plan of chronic medical problems.

## 2023-09-20 NOTE — Patient Instructions (Incomplete)
Blood work was ordered.       Medications changes include :   decrease lasix to 20 mg daily and decrease potassium to 20 mg daily   A referral was ordered for the heart failure clinic.      Return in about 2 months (around 09/12/2023) for follow up.

## 2023-09-21 ENCOUNTER — Inpatient Hospital Stay: Payer: Medicare PPO | Admitting: Internal Medicine

## 2023-09-21 ENCOUNTER — Other Ambulatory Visit (HOSPITAL_COMMUNITY): Payer: Self-pay

## 2023-09-21 DIAGNOSIS — E039 Hypothyroidism, unspecified: Secondary | ICD-10-CM | POA: Diagnosis not present

## 2023-09-21 DIAGNOSIS — K449 Diaphragmatic hernia without obstruction or gangrene: Secondary | ICD-10-CM | POA: Diagnosis not present

## 2023-09-21 DIAGNOSIS — H5310 Unspecified subjective visual disturbances: Secondary | ICD-10-CM | POA: Diagnosis not present

## 2023-09-21 DIAGNOSIS — F419 Anxiety disorder, unspecified: Secondary | ICD-10-CM | POA: Diagnosis not present

## 2023-09-21 DIAGNOSIS — G912 (Idiopathic) normal pressure hydrocephalus: Secondary | ICD-10-CM | POA: Diagnosis not present

## 2023-09-21 DIAGNOSIS — F32A Depression, unspecified: Secondary | ICD-10-CM | POA: Diagnosis not present

## 2023-09-21 DIAGNOSIS — J45909 Unspecified asthma, uncomplicated: Secondary | ICD-10-CM | POA: Diagnosis not present

## 2023-09-21 DIAGNOSIS — D802 Selective deficiency of immunoglobulin A [IgA]: Secondary | ICD-10-CM | POA: Diagnosis not present

## 2023-09-21 DIAGNOSIS — Z48811 Encounter for surgical aftercare following surgery on the nervous system: Secondary | ICD-10-CM | POA: Diagnosis not present

## 2023-09-21 DIAGNOSIS — E871 Hypo-osmolality and hyponatremia: Secondary | ICD-10-CM

## 2023-09-21 NOTE — Assessment & Plan Note (Deleted)
Having increased anxiety secondary to her recent hospitalization for the hyponatremia This was traumatic for her due to the confusion and disorientation waking up alone in the hospital in addition to just having surgery and hospital management Continue sertraline 200 mg daily Last week I did start her on clonazepam 0.25-0.5 mg twice daily as needed

## 2023-09-21 NOTE — Assessment & Plan Note (Deleted)
Recent episode of hyponatremia with confusion, weakness, word finding difficulty, headaches, blurry vision and urinary symptoms Secondary to postop after VP shunt placement Hospitalized for 2 days Sodium improved with IV normal saline Sertraline and trazodone temporarily held, but restarted BMP today

## 2023-09-21 NOTE — Assessment & Plan Note (Deleted)
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

## 2023-09-23 DIAGNOSIS — H5310 Unspecified subjective visual disturbances: Secondary | ICD-10-CM | POA: Diagnosis not present

## 2023-09-23 DIAGNOSIS — K449 Diaphragmatic hernia without obstruction or gangrene: Secondary | ICD-10-CM | POA: Diagnosis not present

## 2023-09-23 DIAGNOSIS — G912 (Idiopathic) normal pressure hydrocephalus: Secondary | ICD-10-CM | POA: Diagnosis not present

## 2023-09-23 DIAGNOSIS — F419 Anxiety disorder, unspecified: Secondary | ICD-10-CM | POA: Diagnosis not present

## 2023-09-23 DIAGNOSIS — J45909 Unspecified asthma, uncomplicated: Secondary | ICD-10-CM | POA: Diagnosis not present

## 2023-09-23 DIAGNOSIS — E039 Hypothyroidism, unspecified: Secondary | ICD-10-CM | POA: Diagnosis not present

## 2023-09-23 DIAGNOSIS — D802 Selective deficiency of immunoglobulin A [IgA]: Secondary | ICD-10-CM | POA: Diagnosis not present

## 2023-09-23 DIAGNOSIS — Z48811 Encounter for surgical aftercare following surgery on the nervous system: Secondary | ICD-10-CM | POA: Diagnosis not present

## 2023-09-23 DIAGNOSIS — F32A Depression, unspecified: Secondary | ICD-10-CM | POA: Diagnosis not present

## 2023-09-26 ENCOUNTER — Encounter: Payer: Self-pay | Admitting: Internal Medicine

## 2023-09-26 NOTE — Progress Notes (Unsigned)
Subjective:    Patient ID: Jo Gomez, female    DOB: [birth year], 78 y.o.   MRN: [id number]     HPI Soumya is here for follow up from the hospital  Admitted 09/09/23 -  09/10/23 at [facility] for NPH  She has history of a third ventral colloid cyst.  She was admitted for her NPH after determining that her ambulation difficulty improved after lumbar puncture.  VPS Certas placement and septostomy, she tolerated the procedure well without complications.  She progressed well postop and on the day of discharge she had adequate pain control with oral pain medication.  She was ambulating independently and tolerating diet.  She was advised to take Tylenol and Peri-Colace.   Admitted 09/12/2023-09/14/2023 to [facility] for hyponatremia  She presented with generalized weakness, confusion, word finding difficulty, headaches, blurry vision and urinary symptoms.  She is found to have hyponatremia secondary to poor p.o. intake in the setting of recent surgery.  She was drinking a lot of water.  Sodium was 124 on admission.  Serum osmolality was 264 and urine osmolality was 31 with a urine sodium of 19.  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  Initially Zoloft and trazodone were held, but restarted once SIADH was removed less likely.  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  She was treated with IV normal saline.  Sodium slowly improved.  On day of discharge sodium was 133.  She was tolerating p.o.  Neurosurgery was consulted in the ED given her confusion, headaches with recent VP shunt placement.  CT head was as expected for postop status.  Neurosurgery felt symptoms were related to hyponatremia  Chronic medical problems stable-no changes Has follow-up with neurosurgery as an outpatient-09/23/2023  Has home PT, OT  With all of the above she has had increased anxiety-the second hospitalizations she spent 2 nights in the hospital and  was waking up alone and with almost nightmare events-she did not know where she was was planning on escaping.  She had alarms going off if she left the bed and had 2 difficult nurses to deal with.  I started her on low-dose clonazepam to help ease anxiety.     Medications and allergies reviewed with patient and updated if appropriate.  Current Outpatient Medications on File Prior to Visit  Medication Sig Dispense Refill   acetaminophen (TYLENOL) 500 MG tablet Take 1 tablet (500 mg total) by mouth every 6 (six) hours as needed for mild pain (1-3). 30 tablet 0   alendronate (FOSAMAX) 70 MG tablet Take 1 tablet (70 mg total) by mouth every 7 (seven) days. Take first thing in am with 6 oz. Water.  Be upright after taking.  Eat nothing for one hour. 12 tablet 3   clonazePAM (KLONOPIN) 0.5 MG tablet Take 1 tablet (0.5 mg total) by mouth 2 (two) times daily as needed for anxiety. 60 tablet 1   cyanocobalamin (VITAMIN B12) 1000 MCG/ML injection Inject 1 mL (1,000 mcg total) into the skin every 30 (thirty) days. 3 mL 1   estrogen-methylTESTOSTERone (EST ESTROGENS-METHYLTEST HS) 0.625-1.25 MG tablet Take 1 tablet by mouth 3 (three) times a week. 36 tablet 1   fluticasone (FLOVENT HFA) 110 MCG/ACT inhaler Inhale 2 puffs into the lungs daily. Rinse mouth after each use. 12 g 2   gabapentin (NEURONTIN) 100 MG capsule Take 1 capsule (100 mg total) by mouth 3 (three) times daily. 90 capsule 11  levothyroxine (SYNTHROID) 75 MCG tablet TAKE 1 TABLET (75 MCG) BY MOUTH DAILY BEFORE BREAKFAST 90 tablet 3   Risankizumab-rzaa (SKYRIZI PEN) 150 MG/ML SOAJ Inject subcutaneously once every 3 months 1 mL 3   sertraline (ZOLOFT) 100 MG tablet TAKE 2 TABLETS BY MOUTH DAILY 180 tablet 3   traZODone (DESYREL) 150 MG tablet TAKE 1 TABLET BY MOUTH AT BEDTIME (Patient taking differently: Take 75 mg by mouth at bedtime. TAKE 1 TABLET BY MOUTH AT BEDTIME 1/2 table nightly) 90 tablet 3   TUBERCULIN SYR 1CC/25GX5/8" (SAFETY-LOK TB  SYR 1CC/25GX5/8") 25G X 5/8" 1 ML MISC USE AS DIRECTED FOR B-12 INJECTIONS. 20 each 3   valACYclovir (VALTREX) 1000 MG tablet TAKE 2 TABLETS BY MOUTH EVERY 12 HOURS FOR 1 DAY AS NEEDED 20 tablet 3   Vitamin D, Ergocalciferol, (DRISDOL) 1.25 MG (50000 UNIT) CAPS capsule TAKE 1 CAPSULE BY MOUTH EVERY OTHER WEEK 6 capsule 2   No current facility-administered medications on file prior to visit.     Review of Systems     Objective:  There were no vitals filed for this visit. BP Readings from Last 3 Encounters:  08/30/23 (!) 151/83  08/13/23 126/70  06/23/23 111/65   Wt Readings from Last 3 Encounters:  08/30/23 114 lb (51.7 kg)  08/16/23 114 lb (51.7 kg)  08/13/23 118 lb (53.5 kg)   There is no height or weight on file to calculate BMI.    Physical Exam     Lab Results  Component Value Date   WBC 6.6 04/15/2023   HGB 13.5 04/15/2023   HCT 39.8 04/15/2023   PLT 234.0 04/15/2023   GLUCOSE 89 04/15/2023   CHOL 204 (H) 04/15/2023   TRIG 71.0 04/15/2023   HDL 59.90 04/15/2023   LDLDIRECT 114.9 08/07/2013   LDLCALC 130 (H) 04/15/2023   ALT 37 (H) 04/15/2023   AST 33 04/15/2023   NA 132 (L) 04/15/2023   K 3.9 04/15/2023   CL 97 04/15/2023   CREATININE 0.84 04/15/2023   BUN 15 04/15/2023   CO2 28 04/15/2023   TSH 0.88 04/15/2023   HGBA1C 5.2 03/18/2023     Assessment & Plan:    See Problem List for Assessment and Plan of chronic medical problems.

## 2023-09-26 NOTE — Patient Instructions (Addendum)
      Blood work was ordered.       Medications changes include :   None    A referral was ordered and someone will call you to schedule an appointment.     Return in about 6 months (around 03/26/2024) for follow up.

## 2023-09-27 ENCOUNTER — Ambulatory Visit: Payer: Medicare PPO | Admitting: Internal Medicine

## 2023-09-27 VITALS — BP 124/60 | HR 70 | Temp 98.3°F | Ht 60.0 in | Wt 117.0 lb

## 2023-09-27 DIAGNOSIS — G4709 Other insomnia: Secondary | ICD-10-CM

## 2023-09-27 DIAGNOSIS — G912 (Idiopathic) normal pressure hydrocephalus: Secondary | ICD-10-CM | POA: Diagnosis not present

## 2023-09-27 DIAGNOSIS — F419 Anxiety disorder, unspecified: Secondary | ICD-10-CM | POA: Diagnosis not present

## 2023-09-27 DIAGNOSIS — F3289 Other specified depressive episodes: Secondary | ICD-10-CM | POA: Diagnosis not present

## 2023-09-27 DIAGNOSIS — E871 Hypo-osmolality and hyponatremia: Secondary | ICD-10-CM | POA: Diagnosis not present

## 2023-09-27 LAB — BASIC METABOLIC PANEL
BUN: 15 mg/dL (ref 6–23)
CO2: 26 meq/L (ref 19–32)
Calcium: 8.8 mg/dL (ref 8.4–10.5)
Chloride: 96 meq/L (ref 96–112)
Creatinine, Ser: 0.81 mg/dL (ref 0.40–1.20)
GFR: 70.93 mL/min (ref >60.00)
Glucose, Bld: 112 mg/dL — ABNORMAL HIGH (ref 70–99)
Potassium: 4.2 meq/L (ref 3.5–5.1)
Sodium: 130 meq/L — ABNORMAL LOW (ref 135–145)

## 2023-09-27 NOTE — Assessment & Plan Note (Signed)
Chronic Controlled Will continue sertraline 200 mg daily

## 2023-09-27 NOTE — Assessment & Plan Note (Signed)
Chronic Controlled, stable Continue trazodone 75 mg HS

## 2023-09-27 NOTE — Assessment & Plan Note (Addendum)
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

## 2023-09-27 NOTE — Assessment & Plan Note (Signed)
 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

## 2023-09-27 NOTE — Assessment & Plan Note (Signed)
Having increased anxiety secondary to her recent hospitalization for the hyponatremia This was traumatic for her due to the confusion and disorientation waking up alone in the hospital in addition to just having surgery and hospital management Continue sertraline 200 mg daily Last week I did start her on clonazepam 0.25-0.5 mg twice daily as needed-she only took 1 dose and her anxiety is much better Anxiety overall back to baseline

## 2023-09-28 ENCOUNTER — Other Ambulatory Visit: Payer: Self-pay

## 2023-09-28 ENCOUNTER — Encounter: Payer: Self-pay | Admitting: Internal Medicine

## 2023-09-28 ENCOUNTER — Other Ambulatory Visit (HOSPITAL_COMMUNITY): Payer: Self-pay

## 2023-09-28 DIAGNOSIS — F419 Anxiety disorder, unspecified: Secondary | ICD-10-CM | POA: Diagnosis not present

## 2023-09-28 DIAGNOSIS — E039 Hypothyroidism, unspecified: Secondary | ICD-10-CM | POA: Diagnosis not present

## 2023-09-28 DIAGNOSIS — F32A Depression, unspecified: Secondary | ICD-10-CM | POA: Diagnosis not present

## 2023-09-28 DIAGNOSIS — J45909 Unspecified asthma, uncomplicated: Secondary | ICD-10-CM | POA: Diagnosis not present

## 2023-09-28 DIAGNOSIS — H5310 Unspecified subjective visual disturbances: Secondary | ICD-10-CM | POA: Diagnosis not present

## 2023-09-28 DIAGNOSIS — Z48811 Encounter for surgical aftercare following surgery on the nervous system: Secondary | ICD-10-CM | POA: Diagnosis not present

## 2023-09-28 DIAGNOSIS — D802 Selective deficiency of immunoglobulin A [IgA]: Secondary | ICD-10-CM | POA: Diagnosis not present

## 2023-09-28 DIAGNOSIS — G912 (Idiopathic) normal pressure hydrocephalus: Secondary | ICD-10-CM | POA: Diagnosis not present

## 2023-09-28 DIAGNOSIS — K449 Diaphragmatic hernia without obstruction or gangrene: Secondary | ICD-10-CM | POA: Diagnosis not present

## 2023-09-29 DIAGNOSIS — J45909 Unspecified asthma, uncomplicated: Secondary | ICD-10-CM | POA: Diagnosis not present

## 2023-09-29 DIAGNOSIS — Z982 Presence of cerebrospinal fluid drainage device: Secondary | ICD-10-CM

## 2023-09-29 DIAGNOSIS — E871 Hypo-osmolality and hyponatremia: Secondary | ICD-10-CM

## 2023-09-29 DIAGNOSIS — D802 Selective deficiency of immunoglobulin A [IgA]: Secondary | ICD-10-CM | POA: Diagnosis not present

## 2023-09-29 DIAGNOSIS — F419 Anxiety disorder, unspecified: Secondary | ICD-10-CM | POA: Diagnosis not present

## 2023-09-29 DIAGNOSIS — H5310 Unspecified subjective visual disturbances: Secondary | ICD-10-CM | POA: Diagnosis not present

## 2023-09-29 DIAGNOSIS — F32A Depression, unspecified: Secondary | ICD-10-CM | POA: Diagnosis not present

## 2023-09-29 DIAGNOSIS — G912 (Idiopathic) normal pressure hydrocephalus: Secondary | ICD-10-CM | POA: Diagnosis not present

## 2023-09-29 DIAGNOSIS — E079 Disorder of thyroid, unspecified: Secondary | ICD-10-CM

## 2023-09-29 DIAGNOSIS — Z48811 Encounter for surgical aftercare following surgery on the nervous system: Secondary | ICD-10-CM | POA: Diagnosis not present

## 2023-09-29 DIAGNOSIS — K449 Diaphragmatic hernia without obstruction or gangrene: Secondary | ICD-10-CM | POA: Diagnosis not present

## 2023-09-29 DIAGNOSIS — Z87891 Personal history of nicotine dependence: Secondary | ICD-10-CM

## 2023-09-29 DIAGNOSIS — E039 Hypothyroidism, unspecified: Secondary | ICD-10-CM | POA: Diagnosis not present

## 2023-09-29 DIAGNOSIS — Z7983 Long term (current) use of bisphosphonates: Secondary | ICD-10-CM

## 2023-09-30 DIAGNOSIS — G912 (Idiopathic) normal pressure hydrocephalus: Secondary | ICD-10-CM | POA: Diagnosis not present

## 2023-09-30 MED ORDER — ULTICARE TUBERCULIN SAFETY SYR 25G X 5/8" 1 ML MISC
1 refills | Status: AC
  Filled 2023-09-30: qty 3, 90d supply, fill #0
  Filled 2023-12-31: qty 3, 90d supply, fill #1
  Filled 2024-04-12: qty 3, 90d supply, fill #2
  Filled 2024-06-17 – 2024-06-26 (×3): qty 3, 90d supply, fill #3

## 2023-10-01 ENCOUNTER — Other Ambulatory Visit (HOSPITAL_COMMUNITY): Payer: Self-pay

## 2023-10-01 ENCOUNTER — Other Ambulatory Visit: Payer: Self-pay

## 2023-10-03 MED ORDER — VORTIOXETINE HBR 10 MG PO TABS: 10.0000 mg | ORAL_TABLET | Freq: Every day | ORAL | 1 refills | Status: DC

## 2023-10-03 NOTE — Addendum Note (Signed)
 Addended by: Colene Dauphin on: 10/03/2023 01:08 PM   Modules accepted: Orders

## 2023-10-05 ENCOUNTER — Other Ambulatory Visit (HOSPITAL_COMMUNITY): Payer: Self-pay

## 2023-10-06 DIAGNOSIS — K449 Diaphragmatic hernia without obstruction or gangrene: Secondary | ICD-10-CM | POA: Diagnosis not present

## 2023-10-06 DIAGNOSIS — Z48811 Encounter for surgical aftercare following surgery on the nervous system: Secondary | ICD-10-CM | POA: Diagnosis not present

## 2023-10-06 DIAGNOSIS — D802 Selective deficiency of immunoglobulin A [IgA]: Secondary | ICD-10-CM | POA: Diagnosis not present

## 2023-10-06 DIAGNOSIS — G912 (Idiopathic) normal pressure hydrocephalus: Secondary | ICD-10-CM | POA: Diagnosis not present

## 2023-10-06 DIAGNOSIS — J45909 Unspecified asthma, uncomplicated: Secondary | ICD-10-CM | POA: Diagnosis not present

## 2023-10-06 DIAGNOSIS — H5310 Unspecified subjective visual disturbances: Secondary | ICD-10-CM | POA: Diagnosis not present

## 2023-10-06 DIAGNOSIS — F419 Anxiety disorder, unspecified: Secondary | ICD-10-CM | POA: Diagnosis not present

## 2023-10-06 DIAGNOSIS — E039 Hypothyroidism, unspecified: Secondary | ICD-10-CM | POA: Diagnosis not present

## 2023-10-06 DIAGNOSIS — F32A Depression, unspecified: Secondary | ICD-10-CM | POA: Diagnosis not present

## 2023-10-07 ENCOUNTER — Other Ambulatory Visit (HOSPITAL_COMMUNITY): Payer: Self-pay

## 2023-10-07 NOTE — Progress Notes (Signed)
NEUROLOGY FOLLOW UP OFFICE NOTE  [name] [id number]  Subjective:  Jo Gomez is a 78 y.o. year old right-handed female with a medical history of hypothyroidism, insomnia, depression, anxiety, OA, osteoporosis, asthma, B12 deficiency who we last saw on 06/23/23 for imbalance and falls.  To briefly review: 03/18/23: Patient had low back surgery (L5-S1) about 3 years ago (at [facility]). She had extreme back pain radiating into her legs. Surgery helped with her symptoms. Her back pain returned in severity and she has been getting injections (at least 3 since). She had an intraspinal injection about 1.5 months ago. This has really helped with pain.    She has started to have imbalance and falls over the last couple of months. She will wake up and feel like she does not know where her feet are. Her balance will be off. It will come and go. She endorses falls as well. She will fall 3-4 times per week, particularly when out in her yard. She will feel disoriented and off balance then lower herself to the ground. She will feel like she is not picking up her feet and will trip. She has significant difficulty with balance when closing her eyes in the shower. She does not use an ambulatory device (cane or walker).    She does some difficulty with holding her urine. She has had a couple of accidents over the last week. She denies any changes in bowel. She denies saddle anesthesia. She is unsure when her last MRI lumbar spine was (maybe more recently than 2022, but not in last few months).   She endorses occasional cramps in her legs. She feels generally weak, including in the arms.   She denies significant neck pain. She occasionally gets some shooting pains in arms, but nothing significant.   The patient denies symptoms suggestive of oculobulbar weakness including diplopia, ptosis, dysphagia, poor saliva control, dysarthria/dysphonia, impaired mastication, facial  weakness/droop.   There are no neuromuscular respiratory weakness symptoms, particularly orthopnea>dyspnea.    She does not report any constitutional symptoms like fever, night sweats, anorexia or unintentional weight loss.   Patient has not done physical therapy recently.   Patient takes B12 injections every 2 weeks.   EtOH use: maybe 2 beers per week  Restrictive diet? No Family history of neuropathy/myopathy/neurologic disease? No   Of note, patient was seen in this office by Dr. Everlena Cooper in 2019 for cognitive issues and an incidental microbleed seen on MRI brain. Her MoCA at that time was 22/30 (missed 1 point on visuospatial/executive, 2 points on serial 7s and 5 points on delayed recall) per note. She was to have neuropsych testing, but the doctor set to do testing left prior to testing being completed.  06/23/23: Labs showed no significant abnormalities. EMG showed residuals of olf right C8 and S1 radiculopathy, but no large fiber sensorimotor neuropathy.   MRI cervical and thoracic spine on 04/23/23 showed impingement of left C7 right C4-5 and C5-6 roots. Spinal canal was diffusely patent.    I recommended LP to evaluate for possible NPH. Opening pressure was 14 cm H2O (normal) with normal CSF studies (see below). Per patient and PT, patient was much improved in terms of walking after the LP. Given this, I discussed referral to NSGY for consideration of shunt for NPH. 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   She saw Dr. Danielle Dess and  is getting a repeat lumbar puncture next week in the office [facility]. She also has an appointment with [facility] in 08/16/23.   She feels like her legs and balance are getting worse. She has not had an falls, but is having to use 2 walking sticks. She also mentions feeling like her head is going backwards.    Patient went to PT but is now doing  home exercises.  Most recent Assessment and Plan (06/23/23): This is Jo Gomez, a 78 y.o. female with imbalance and falls. Her examination shows gait imbalance and diminished sensation to vibration in her feet with intact strength in all extremities. Her MRI brain, cervical spine, and thoracic spine do not show pathology to explain her imbalance. MRI lumbar spine shows some neural foraminal stenosis most prominent at L5. EMG showed only the residuals of an old right C8 and S1 radiculopathy. 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 Very little CSF was drained though, so this will be rechecked next week, which makes a lot of sense. There is not clear evidence of parkinsonism, but this is a consideration. Large fiber neuropathy was a consideration given the diminished vibratory sense, but EMG did not show this. Small fiber neuropathy is possible but is usually not vibratory but pin prick and temperature sensation loss.   Plan: -Repeat LP as planned at [facility] - patient to get results to me -Continue home PT exercises -May consider skin biopsy for SFN or phosphorylated alpha-synuclein pathology  Since their last visit: Patient had repeat LP on 06/25/23 with improvement in walking again. Her felt more mentally alert as well. She saw [facility] and [facility]. She had surgery at [facility] (R VPS with endoscopic septostomy on 09/09/23 and certas at 4). Per notes, her balance was improved after surgery. She was having headaches though. Her shunt was turned down from 5 to 4 but her symptoms worsened, so this was readjusted on 09/29/22.  Overall she feels she is doing much better. She is walking better. She has not had a fall since having the shunt adjusted. Her cognition has improved. Her urinary symptoms have improved and she no longer has to wear Depends. She gets occasional headaches.  MEDICATIONS:   Outpatient Encounter Medications as of 10/15/2023  Medication Sig   alendronate (FOSAMAX) 70 MG tablet Take 1 tablet (70 mg total) by mouth every 7 (seven) days. Take first thing in am with 6 oz. Water.  Be upright after taking.  Eat nothing for one hour.   clonazePAM (KLONOPIN) 0.5 MG tablet Take 1 tablet (0.5 mg total) by mouth 2 (two) times daily as needed for anxiety.   cyanocobalamin (VITAMIN B12) 1000 MCG/ML injection Inject 1 mL (1,000 mcg total) into the skin every 30 (thirty) days.   fluticasone (FLOVENT HFA) 110 MCG/ACT inhaler Inhale 2 puffs into the lungs daily. Rinse mouth after each use.   gabapentin (NEURONTIN) 100 MG capsule Take 1 capsule (100 mg total) by mouth 3 (three) times daily.   levothyroxine (SYNTHROID) 75 MCG tablet TAKE 1 TABLET (75 MCG) BY MOUTH DAILY BEFORE BREAKFAST   traZODone (DESYREL) 150 MG tablet TAKE 1 TABLET BY MOUTH AT BEDTIME (Patient taking differently: Take 75 mg by mouth at bedtime. TAKE 1 TABLET BY MOUTH AT BEDTIME 1/2 table nightly)   TUBERCULIN SYR 1CC/25GX5/8" (ULTICARE TUBERCULIN SAFETY SYR) 25G X 5/8" 1 ML MISC Use as directed for  monthly B12 injections   Vitamin D, Ergocalciferol, (DRISDOL) 1.25 MG (50000 UNIT) CAPS capsule TAKE 1 CAPSULE BY MOUTH EVERY OTHER WEEK   vortioxetine HBr (TRINTELLIX) 10 MG TABS tablet Take 1 tablet (10 mg total) by mouth daily.   estrogen-methylTESTOSTERone (EST ESTROGENS-METHYLTEST HS) 0.625-1.25 MG tablet Take 1 tablet by mouth 3 (three) times a week.   Risankizumab-rzaa (SKYRIZI PEN) 150 MG/ML SOAJ Inject subcutaneously once every 3 months   valACYclovir (VALTREX) 1000 MG tablet TAKE 2 TABLETS BY MOUTH EVERY 12 HOURS FOR 1 DAY AS NEEDED   No facility-administered encounter medications on file as of 10/15/2023.    PAST MEDICAL HISTORY: 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   Asthma    Depression    GERD (gastroesophageal reflux disease)     diet controlled, no med   History of endoscopy    HSV infection    IgA deficiency (HCC)    Low vitamin B12 level    Osteoporosis    Ovarian cyst    TAH/BSO   Plaque psoriasis    Shingles    Thyroid dysfunction     PAST SURGICAL HISTORY: Past Surgical History:  Procedure Laterality Date   BRAIN SURGERY Right    broken arm Right    surgery for this- metal plates   BROW LIFT Bilateral 08/2013   COLONOSCOPY     LUMBAR DISC SURGERY  10/2020   L-4, L-5, S-1, Dr. Venetia Maxon   THYROIDECTOMY  2000   TOTAL ABDOMINAL HYSTERECTOMY W/ BILATERAL SALPINGOOPHORECTOMY  1999   UPPER GASTROINTESTINAL ENDOSCOPY      ALLERGIES: Allergies  Allergen Reactions   Gammagard S-D Less Iga [Immune Globulin (Human)] Anaphylaxis    Have to have triple washed blood products due to this allergy pt states   Clarithromycin Other (See Comments)    Severe leg and muscle cramps   Erythromycin Base Nausea And Vomiting   Levaquin [Levofloxacin In D5w] Other (See Comments)    tendonitis   Other Rash and Other (See Comments)    IGA Deficiency - catches a lot of viruses   Sulfonamide Derivatives Rash   Tetracycline Rash    FAMILY HISTORY: Family History  Problem Relation Age of Onset   Heart attack Mother    Osteoporosis Mother    Lung cancer Mother    Colon polyps Mother    Arthritis Mother    COPD Father        chronic lung infections   Colon polyps Father    Colon cancer Neg Hx    Esophageal cancer Neg Hx    Stomach cancer Neg Hx    Rectal cancer Neg Hx    Breast cancer Neg Hx     SOCIAL HISTORY: Social History   Tobacco Use   Smoking status: Former    Current packs/day: 0.00    Average packs/day: 0.5 packs/day for 3.0 years (1.5 ttl pk-yrs)    Types: Cigarettes    Start date: 08/24/1965    Quit date: 08/24/1968    Years since quitting: 55.1   Smokeless tobacco: Never   Tobacco comments:    in college  Vaping Use   Vaping status: Never Used  Substance Use Topics   Alcohol use: Yes     Alcohol/week: 2.0 standard drinks of alcohol    Types: 2 Standard drinks or equivalent per week    Comment: 2 glasses a week  Drug use: No   Social History   Social History Narrative   Patient is right-handed. She lives with her wife in a split level house. She occasionally drinks coffee.   Are you right handed or left handed?    Are you currently employed ?    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 wife   What type of home do you live in: 1 story or 2 story? one          Objective:  Vital Signs:  BP 126/68   Pulse 70   Ht 5' (1.524 m)   Wt 116 lb (52.6 kg)   LMP 08/24/1997   SpO2 97%   BMI 22.65 kg/m   General: No acute distress.  Patient appears well-groomed.   Head:  Normocephalic/atraumatic Neck: supple Heart:  Regular rate and rhythm Lungs:  Clear to auscultation bilaterally Back: No paraspinal tenderness Neurological Exam: alert and oriented.  Speech fluent and not dysarthric, language intact.  CN II-XII intact. Bulk and tone normal, muscle strength 5/5 throughout.  Sensation to light touch intact.  Deep tendon reflexes 2+ throughout.  Finger to nose testing intact.  Gait normal.   Labs and Imaging review: New results: TSH (09/12/23 external) wnl CMP (09/11/22 external): significant for Na 124 (chronic); more recently 130 (09/27/23) CBC w/ diff (09/14/23 external): unremarkable  CT head wo contrast (09/12/23): FINDINGS:  Calvarium/skull base: Frontal burr hole. No evidence of acute fracture or destructive lesion. Mastoids and middle ears demonstrate no substantial mucosal disease. Right frontal approach VP shunt catheter without discontinuity. Skin staples overlie the right frontoparietal scalp.   Ventricles: The ventricular system appears similar in size and morphology when compared across modalities to MRI brain dated 05/28/2023. As before, the right lateral ventricle is larger than the left.  Pneumoventricle at bilateral frontal horns and right temporal horn, presumably from recent shunt placement.    Shunt: Right frontal approach VP shunt catheter terminates in the left lateral ventricle at the level of the foramen of Monro. There is a focus of pneumocephalus along its course in the right frontal lobe.   Brain: Evaluation of brain parenchyma limited by low dose technique. Scattered small volume pneumocephalus throughout right cerebral convexity sulci. Similar 5 mm hyperattenuating lesion in the anterior superior third ventricle.   IMPRESSION:  1. Postoperative changes following recent right frontal approach ventricular shunt placement. The ventricular system appears similar in size and configuration when compared across modalities to MRI brain dated 05/28/2023. However, if there is clinical concern for acute ischemia, MRI could further evaluate as CT is relatively insensitive for the detection of an acute infarct in the first 24 to 48 hours.  2.  A 5 mm hyperattenuating structure in the anterior and superior aspect of the third ventricle is unchanged compared to outside CT 02/26/2016, most likely reflecting a colloid cyst.   EMG (04/05/23): NCV & EMG Findings: Extensive electrodiagnostic evaluation of the right upper and lower limbs shows: Right sural, superficial peroneal/fibular, median, ulnar, and radial sensory responses are within normal limits. Right peroneal/fibular (EDB), tibial (AH), median (APB), and ulnar (ADM) motor responses are within normal limits. Right H reflex latency is within normal limits. Chronic motor axon loss changes without accompanying active denervation changes are seen in the right medial head of gastrocnemius, short head of biceps femoris, first dorsal interosseous, extensor indicis proprius, and cervical paraspinal (C7 level) muscles. Lumbosacral paraspinal muscles were  deferred due to prior surgery.   Impression: This is an abnormal study. The findings are  most consistent with the following: The residuals of an old intraspinal canal lesion (ie: motor radiculopathy) at the right S1 root or segment, mild in degree electrically. 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 No electrodiagnostic evidence of a large fiber sensorimotor neuropathy. No electrodiagnostic evidence of a right median mononeuropathy at or distal to the wrist (ie: carpal tunnel syndrome).   MRI cervical and thoracic spine wo contrast (04/23/23): FINDINGS: MRI CERVICAL SPINE FINDINGS   Alignment: Physiologic.   Vertebrae: No fracture, evidence of discitis, or bone lesion.   Cord: Normal signal and morphology.   Posterior Fossa, vertebral arteries, paraspinal tissues: Negative.   Disc levels:   C2-3: Unremarkable.   C3-4: Unremarkable.   C4-5: Disc narrowing with uncovertebral ridging eccentric to the right where there is moderate foraminal stenosis.   C5-6: Disc collapse with endplate and uncovertebral ridging eccentric to the right where there is advanced foraminal stenosis. Patent canal and left foramen.   C6-7: Disc narrowing and bulging with left paracentral protrusion contacting the ventral cord and likely affecting the left intradural C7 nerve root. There is continuation into the left foramen where there is moderate stenosis negative facets.   C7-T1:Unremarkable.   MRI THORACIC SPINE FINDINGS   Alignment:  Physiologic.   Vertebrae: No fracture, evidence of discitis, or bone lesion.   Cord:  Normal signal and morphology.   Paraspinal and other soft tissues: Negative.   Disc levels:   Diffusely preserved disc height and hydration. No facet spurring and no neural impingement.   IMPRESSION: Cervical spine:   1. C6-7 left paracentral to foraminal herniation contacting the ventral cord and likely affecting the left C7 nerve root. 2. Degenerative right  foraminal impingement at C4-5 and C5-6. 3. Diffusely patent spinal canal.   Thoracic spine:   Normal.   MRI brain w/wo contrast (05/28/23): FINDINGS: Brain: There is no evidence of an acute infarct, midline shift, or extra-axial fluid collection. Mild cerebral atrophy is without lobar predominance and is considered to be within normal limits for age. Scattered punctate T2 hyperintensities in the cerebral white matter are unchanged and nonspecific but compatible with minimal chronic small vessel ischemic disease, also considered to be within normal limits for age. A chronic hemorrhage in the right thalamus is unchanged from the prior MRI.   A 5 mm T1 hyperintense mass along the superior aspect of the third ventricle is unchanged and consistent with a colloid cyst without evidence of associated hydrocephalus. No suspicious brain parenchymal or meningeal enhancement is identified. A small developmental venous anomaly is incidentally noted in the left frontal lobe.   Vascular: Major intracranial vascular flow voids are preserved.   Skull and upper cervical spine: Unremarkable bone marrow signal.   Sinuses/Orbits: Unremarkable orbits. Mild right ethmoid air cell mucosal thickening. Clear mastoid air cells.   Other: None.   IMPRESSION: 1. No acute intracranial abnormality. 2. 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  Previously reviewed results: 03/18/23: B1 wnl B12: 1363 HbA1c: 5.2 IFE: no M protein Copper wnl Vit E wnl   04/15/23: TSH wnl Vit D wnl Lipid panel: LDL 130   CSF (05/17/23): Opening pressure: 14 cm H2O. Walking markedly improved per patient and PT after LP Routine analysis: 12000 RBC, 11 WBC (normal when accounting for traumatic tap), protein  52, glucose 58 Cytology negative Culture and stain (bacterial and fungal): negative   02/18/22: CBC unremarkable CMP significant for low sodium (132) which is  chronic Lipid panel: total cholesterol 173, LDL 93 TSH: 1.68 B12: 435 (lowest was 217 about 13 years ago) Vit D: wnl HbA1c: 5.3   ANA (09/16/21): negative    Carotid ultrasound (02/23/23): Summary:  Right Carotid: There is no evidence of stenosis in the right ICA. The extracranial vessels were near-normal with only minimal wall thickening or plaque.   Left Carotid: Velocities in the left ICA are consistent with a 1-39% stenosis.   Vertebrals: Bilateral vertebral arteries demonstrate antegrade flow.  Subclavians: Normal flow hemodynamics were seen in bilateral subclavian arteries.    Holter monitor (02/08/23-02/12/23): Predominant rhythm was normal sinus rhythm with an average heart rate of 69 bpm and ranged from 48 to 114 bpm   Rare PACs, atrial couplets, atrial triplets   Rare PVC   CT head wo contrast (01/01/23): (for head trauma) FINDINGS: Brain: No acute territorial infarction or intracranial hemorrhage is visualized. Small hyperdense 6 mm nodule along the anterior third ventral consistent with small colloid cyst. The ventricles are stable in size.   Vascular: No hyperdense vessels.  Carotid vascular calcification   Skull: Normal. Negative for fracture or focal lesion.   Sinuses/Orbits: No acute finding.   Other: None   IMPRESSION: 1. No CT evidence for acute intracranial abnormality. 2. Stable 6 mm colloid cyst.   MRI lumbar spine w/wo contrast (12/31/20): FINDINGS: Segmentation:  5 lumbar type vertebral bodies.   Alignment:  No malalignment.   Vertebrae: No fracture or primary bone lesion. Mild discogenic endplate edema and enhancement at L5-S1 could contribute to low back pain.   Conus medullaris and cauda equina: Conus extends to the L1 level. Conus and cauda equina appear normal.   Paraspinal and other soft tissues: Negative   Disc levels:   No abnormality from T11-12 through L1-2.   L2-3: Minimal disc bulge.  No stenosis.   L3-4: Minimal disc bulge.   No stenosis.   L4-5: Broad-based disc herniation more prominent towards the left. Mild facet and ligamentous hypertrophy. Narrowing of the lateral recesses left more than right. Either L5 nerve could be affected, more likely the left. L4 nerves appear to exit the foramina without gross compression.   L5-S1: Disc degeneration with loss of disc height. Shallow protrusion of the disc more prominent towards the left. Mild facet and ligamentous hypertrophy. Mild stenosis of both subarticular lateral recesses and neural foramina.   IMPRESSION: L4-5: Shallow disc herniation more prominent towards the left. Facet and ligamentous hypertrophy. Stenosis of the lateral recesses left more than right. Neural compression could occur at this level, particularly on the left. This has worsened since 2016.   L5-S1: Endplate osteophytes and shallow disc protrusion. Facet and ligamentous hypertrophy. Narrowing of both subarticular lateral recesses and foramina which could possibly cause neural compression, particularly in the lateral recesses. Slight worsening since 2016.   MRI brain w/wo contrast (01/04/18 - external): I reviewed images and agree with findings below.     EMG (04/05/23): NCV & EMG Findings: Extensive electrodiagnostic evaluation of the right upper and lower limbs shows: Right sural, superficial peroneal/fibular, median, ulnar, and radial sensory responses are within normal limits. Right peroneal/fibular (EDB), tibial (AH), median (APB), and ulnar (ADM) motor responses are within normal limits. Right H reflex latency is within normal limits. Chronic motor axon loss changes without accompanying active denervation changes are seen in the  right medial head of gastrocnemius, short head of biceps femoris, first dorsal interosseous, extensor indicis proprius, and cervical paraspinal (C7 level) muscles. Lumbosacral paraspinal muscles were deferred due to prior surgery.   Impression: This is an  abnormal study. 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 No electrodiagnostic evidence of a large fiber sensorimotor neuropathy. No electrodiagnostic evidence of a right median mononeuropathy at or distal to the wrist (ie: carpal tunnel syndrome).   MRI cervical and thoracic spine wo contrast (04/23/23): FINDINGS: MRI CERVICAL SPINE FINDINGS   Alignment: Physiologic.   Vertebrae: No fracture, evidence of discitis, or bone lesion.   Cord: Normal signal and morphology.   Posterior Fossa, vertebral arteries, paraspinal tissues: Negative.   Disc levels:   C2-3: Unremarkable.   C3-4: Unremarkable.   C4-5: Disc narrowing with uncovertebral ridging eccentric to the right where there is moderate foraminal stenosis.   C5-6: Disc collapse with endplate and uncovertebral ridging eccentric to the right where there is advanced foraminal stenosis. Patent canal and left foramen.   C6-7: Disc narrowing and bulging with left paracentral protrusion contacting the ventral cord and likely affecting the left intradural C7 nerve root. There is continuation into the left foramen where there is moderate stenosis negative facets.   C7-T1:Unremarkable.   MRI THORACIC SPINE FINDINGS   Alignment:  Physiologic.   Vertebrae: No fracture, evidence of discitis, or bone lesion.   Cord:  Normal signal and morphology.   Paraspinal and other soft tissues: Negative.   Disc levels:   Diffusely preserved disc height and hydration. No facet spurring and no neural impingement.   IMPRESSION: Cervical spine:   1. C6-7 left paracentral to foraminal herniation contacting the ventral cord and likely affecting the left C7  nerve root. 2. Degenerative right foraminal impingement at C4-5 and C5-6. 3. Diffusely patent spinal canal.   Thoracic spine:   Normal.   MRI brain w/wo contrast (05/28/23): FINDINGS: Brain: There is no evidence of an acute infarct, midline shift, or extra-axial fluid collection. Mild cerebral atrophy is without lobar predominance and is considered to be within normal limits for age. Scattered punctate T2 hyperintensities in the cerebral white matter are unchanged and nonspecific but compatible with minimal chronic small vessel ischemic disease, also considered to be within normal limits for age. A chronic hemorrhage in the right thalamus is unchanged from the prior MRI.   A 5 mm T1 hyperintense mass along the superior aspect of the third ventricle is unchanged and consistent with a colloid cyst without evidence of associated hydrocephalus. No suspicious brain parenchymal or meningeal enhancement is identified. A small developmental venous anomaly is incidentally noted in the left frontal lobe.   Vascular: Major intracranial vascular flow voids are preserved.   Skull and upper cervical spine: Unremarkable bone marrow signal.   Sinuses/Orbits: Unremarkable orbits. Mild right ethmoid air cell mucosal thickening. Clear mastoid air cells.   Other: None.   IMPRESSION: 1. No acute intracranial abnormality. 2. 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  Assessment/Plan:  This is Jo Gomez, a 78 y.o. female with imbalance, urinary incontinence, and cognitive changes. 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  She had a shunt placed by NSGY at [facility] on 09/09/23 with great improvement of her symptoms.   Plan: -Continue to follow up with NSGY as planned for shunt management  Return to clinic as needed  Total time spent reviewing records,  interview, history/exam, documentation, and coordination of care on day of encounter:  25 min  Jacquelyne Balint, MD

## 2023-10-15 ENCOUNTER — Ambulatory Visit: Payer: Medicare PPO | Admitting: Neurology

## 2023-10-15 ENCOUNTER — Encounter: Payer: Self-pay | Admitting: Neurology

## 2023-10-15 VITALS — BP 126/68 | HR 70 | Ht 60.0 in | Wt 116.0 lb

## 2023-10-15 DIAGNOSIS — G912 (Idiopathic) normal pressure hydrocephalus: Secondary | ICD-10-CM | POA: Diagnosis not present

## 2023-10-15 DIAGNOSIS — K449 Diaphragmatic hernia without obstruction or gangrene: Secondary | ICD-10-CM | POA: Diagnosis not present

## 2023-10-15 DIAGNOSIS — J45909 Unspecified asthma, uncomplicated: Secondary | ICD-10-CM | POA: Diagnosis not present

## 2023-10-15 DIAGNOSIS — E039 Hypothyroidism, unspecified: Secondary | ICD-10-CM | POA: Diagnosis not present

## 2023-10-15 DIAGNOSIS — F32A Depression, unspecified: Secondary | ICD-10-CM | POA: Diagnosis not present

## 2023-10-15 DIAGNOSIS — H5310 Unspecified subjective visual disturbances: Secondary | ICD-10-CM | POA: Diagnosis not present

## 2023-10-15 DIAGNOSIS — Z48811 Encounter for surgical aftercare following surgery on the nervous system: Secondary | ICD-10-CM | POA: Diagnosis not present

## 2023-10-15 DIAGNOSIS — D802 Selective deficiency of immunoglobulin A [IgA]: Secondary | ICD-10-CM | POA: Diagnosis not present

## 2023-10-15 DIAGNOSIS — F419 Anxiety disorder, unspecified: Secondary | ICD-10-CM | POA: Diagnosis not present

## 2023-10-18 DIAGNOSIS — G930 Cerebral cysts: Secondary | ICD-10-CM | POA: Diagnosis not present

## 2023-10-18 DIAGNOSIS — Z982 Presence of cerebrospinal fluid drainage device: Secondary | ICD-10-CM | POA: Diagnosis not present

## 2023-10-18 DIAGNOSIS — G912 (Idiopathic) normal pressure hydrocephalus: Secondary | ICD-10-CM | POA: Diagnosis not present

## 2023-10-22 DIAGNOSIS — J45909 Unspecified asthma, uncomplicated: Secondary | ICD-10-CM | POA: Diagnosis not present

## 2023-10-22 DIAGNOSIS — D802 Selective deficiency of immunoglobulin A [IgA]: Secondary | ICD-10-CM | POA: Diagnosis not present

## 2023-10-22 DIAGNOSIS — Z48811 Encounter for surgical aftercare following surgery on the nervous system: Secondary | ICD-10-CM | POA: Diagnosis not present

## 2023-10-22 DIAGNOSIS — F32A Depression, unspecified: Secondary | ICD-10-CM | POA: Diagnosis not present

## 2023-10-22 DIAGNOSIS — G912 (Idiopathic) normal pressure hydrocephalus: Secondary | ICD-10-CM | POA: Diagnosis not present

## 2023-10-22 DIAGNOSIS — K449 Diaphragmatic hernia without obstruction or gangrene: Secondary | ICD-10-CM | POA: Diagnosis not present

## 2023-10-22 DIAGNOSIS — F419 Anxiety disorder, unspecified: Secondary | ICD-10-CM | POA: Diagnosis not present

## 2023-10-22 DIAGNOSIS — H5310 Unspecified subjective visual disturbances: Secondary | ICD-10-CM | POA: Diagnosis not present

## 2023-10-22 DIAGNOSIS — E039 Hypothyroidism, unspecified: Secondary | ICD-10-CM | POA: Diagnosis not present

## 2023-11-01 ENCOUNTER — Ambulatory Visit: Admitting: Internal Medicine

## 2023-11-01 ENCOUNTER — Other Ambulatory Visit (HOSPITAL_COMMUNITY): Payer: Self-pay

## 2023-11-01 ENCOUNTER — Telehealth (INDEPENDENT_AMBULATORY_CARE_PROVIDER_SITE_OTHER): Payer: Self-pay | Admitting: Otolaryngology

## 2023-11-01 VITALS — BP 126/76 | HR 70 | Temp 97.8°F | Ht 60.0 in | Wt 117.0 lb

## 2023-11-01 DIAGNOSIS — F3289 Other specified depressive episodes: Secondary | ICD-10-CM

## 2023-11-01 DIAGNOSIS — R051 Acute cough: Secondary | ICD-10-CM | POA: Diagnosis not present

## 2023-11-01 DIAGNOSIS — U071 COVID-19: Secondary | ICD-10-CM | POA: Diagnosis not present

## 2023-11-01 LAB — POCT INFLUENZA A/B
Influenza A, POC: NEGATIVE
Influenza B, POC: NEGATIVE

## 2023-11-01 LAB — POCT RESPIRATORY SYNCYTIAL VIRUS: RSV Rapid Ag: NEGATIVE

## 2023-11-01 LAB — POC COVID19 BINAXNOW: SARS Coronavirus 2 Ag: POSITIVE — AB

## 2023-11-01 MED ORDER — AMOXICILLIN-POT CLAVULANATE 875-125 MG PO TABS
1.0000 | ORAL_TABLET | Freq: Two times a day (BID) | ORAL | 0 refills | Status: AC
Start: 2023-11-01 — End: 2023-11-24
  Filled 2023-11-01: qty 42, 21d supply, fill #0

## 2023-11-01 NOTE — Assessment & Plan Note (Signed)
 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

## 2023-11-01 NOTE — Telephone Encounter (Signed)
 Confirmed appt & location [id number]

## 2023-11-01 NOTE — Patient Instructions (Addendum)
     You were tested for covid, flu and rsv.    You have covid.      Medications changes include :   Augmentin twice daily for up to 21 days if needed    Return if symptoms worsen or fail to improve.

## 2023-11-01 NOTE — Assessment & Plan Note (Signed)
 Stopped sertraline Has not started the trintellix  Denies any depression at this time - just monitor

## 2023-11-01 NOTE — Progress Notes (Signed)
 Subjective:    Patient ID: Jo Gomez, female    DOB: 20-Dec-1945, 78 y.o.   MRN: [id number]      HPI Jo Gomez is here for  Chief Complaint  Patient presents with   Cough    Started Friday, productive cough   Nasal Congestion    Her symptoms started about 10 days ago.  She tested herself for covid 7 days ago and it was negative.      She states dec appetite, fatigue, nasal congestion, PND, sinus pressure, ST, chest tightness, cough that is productive at times of yellow mucus, loose stools, myalgias. No fever or headaches.    Medications and allergies reviewed with patient and updated if appropriate.  Current Outpatient Medications on File Prior to Visit  Medication Sig Dispense Refill   alendronate (FOSAMAX) 70 MG tablet Take 1 tablet (70 mg total) by mouth every 7 (seven) days. Take first thing in am with 6 oz. Water.  Be upright after taking.  Eat nothing for one hour. 12 tablet 3   clonazePAM (KLONOPIN) 0.5 MG tablet Take 1 tablet (0.5 mg total) by mouth 2 (two) times daily as needed for anxiety. 60 tablet 1   cyanocobalamin (VITAMIN B12) 1000 MCG/ML injection Inject 1 mL (1,000 mcg total) into the skin every 30 (thirty) days. 3 mL 1   fluticasone (FLOVENT HFA) 110 MCG/ACT inhaler Inhale 2 puffs into the lungs daily. Rinse mouth after each use. 12 g 2   gabapentin (NEURONTIN) 100 MG capsule Take 1 capsule (100 mg total) by mouth 3 (three) times daily. 90 capsule 11   levothyroxine (SYNTHROID) 75 MCG tablet TAKE 1 TABLET (75 MCG) BY MOUTH DAILY BEFORE BREAKFAST 90 tablet 3   traZODone (DESYREL) 150 MG tablet TAKE 1 TABLET BY MOUTH AT BEDTIME (Patient taking differently: Take 75 mg by mouth at bedtime. TAKE 1 TABLET BY MOUTH AT BEDTIME 1/2 table nightly) 90 tablet 3   TUBERCULIN SYR 1CC/25GX5/8" (ULTICARE TUBERCULIN SAFETY SYR) 25G X 5/8" 1 ML MISC Use as directed for monthly B12 injections 12 each 1   Vitamin D, Ergocalciferol, (DRISDOL) 1.25 MG (50000 UNIT) CAPS capsule  TAKE 1 CAPSULE BY MOUTH EVERY OTHER WEEK 6 capsule 2   vortioxetine HBr (TRINTELLIX) 10 MG TABS tablet Take 1 tablet (10 mg total) by mouth daily. 90 tablet 1   No current facility-administered medications on file prior to visit.    Review of Systems  Constitutional:  Positive for appetite change (decreased) and fatigue. Negative for chills and fever (low grade for a couple of days).  HENT:  Positive for congestion, postnasal drip, sinus pressure and sore throat. Negative for ear pain.   Respiratory:  Positive for cough (yellow mucus) and chest tightness. Negative for shortness of breath and wheezing.   Gastrointestinal:  Positive for diarrhea (a little).  Musculoskeletal:  Positive for myalgias.  Neurological:  Negative for dizziness, light-headedness and headaches.       Objective:   Vitals:   11/01/23 1528  BP: 126/76  Pulse: 70  Temp: 97.8 F (36.6 C)  SpO2: 98%   BP Readings from Last 3 Encounters:  11/01/23 126/76  10/15/23 126/68  09/27/23 124/60   Wt Readings from Last 3 Encounters:  11/01/23 117 lb (53.1 kg)  10/15/23 116 lb (52.6 kg)  09/27/23 117 lb (53.1 kg)   Body mass index is 22.85 kg/m.    Physical Exam Constitutional:      General: She is not in acute distress.  Appearance: Normal appearance. She is not ill-appearing.  HENT:     Head: Normocephalic and atraumatic.     Right Ear: Tympanic membrane, ear canal and external ear normal.     Left Ear: Tympanic membrane, ear canal and external ear normal.     Mouth/Throat:     Mouth: Mucous membranes are moist.     Pharynx: No oropharyngeal exudate or posterior oropharyngeal erythema.  Eyes:     Conjunctiva/sclera: Conjunctivae normal.  Cardiovascular:     Rate and Rhythm: Normal rate and regular rhythm.  Pulmonary:     Effort: Pulmonary effort is normal. No respiratory distress.     Breath sounds: Normal breath sounds. No wheezing or rales.  Musculoskeletal:     Cervical back: Neck supple. No  tenderness.  Lymphadenopathy:     Cervical: No cervical adenopathy.  Skin:    General: Skin is warm and dry.  Neurological:     Mental Status: She is alert.            Assessment & Plan:    See Problem List for Assessment and Plan of chronic medical problems.

## 2023-11-02 ENCOUNTER — Encounter (INDEPENDENT_AMBULATORY_CARE_PROVIDER_SITE_OTHER): Payer: Self-pay

## 2023-11-02 ENCOUNTER — Ambulatory Visit (INDEPENDENT_AMBULATORY_CARE_PROVIDER_SITE_OTHER): Payer: Medicare PPO | Admitting: Otolaryngology

## 2023-11-02 ENCOUNTER — Ambulatory Visit (INDEPENDENT_AMBULATORY_CARE_PROVIDER_SITE_OTHER): Payer: Medicare PPO | Admitting: Audiology

## 2023-11-02 VITALS — BP 119/73 | HR 72 | Ht 60.0 in | Wt 117.0 lb

## 2023-11-02 DIAGNOSIS — H60332 Swimmer's ear, left ear: Secondary | ICD-10-CM

## 2023-11-02 DIAGNOSIS — H6122 Impacted cerumen, left ear: Secondary | ICD-10-CM

## 2023-11-02 DIAGNOSIS — H903 Sensorineural hearing loss, bilateral: Secondary | ICD-10-CM

## 2023-11-03 DIAGNOSIS — H903 Sensorineural hearing loss, bilateral: Secondary | ICD-10-CM | POA: Insufficient documentation

## 2023-11-03 NOTE — Progress Notes (Signed)
  [address] [place], [place] [address] [phone]  Audiological Evaluation    Name: Jo Gomez     DOB:   Apr 09, 1946      MRN:   [id number]                                                                                     Service Date: 11/03/2023     Accompanied by: unaccompanied to the booth   Patient comes today after Dr. Jearld Fenton, ENT sent a referral for a hearing evaluation due to concerns with ear fullness.   Symptoms Yes Details  Hearing loss  [x]  Both ears  Tinnitus  []    Ear pain/ infections/pressure  [x]  Left ear pressure  Balance problems  []    Noise exposure history  []    Previous ear surgeries  []    Family history of hearing loss  []    Amplification  []    Other  []      Otoscopy: Right ear: Clear external ear canals and notable landmarks visualized on the tympanic membrane. Left ear:  Clear external ear canals and notable landmarks visualized on the tympanic membrane.  Tympanometry: Right ear: Type Ad- Normal external ear canal volume with normal middle ear pressure and high tympanic membrane compliance Left ear: Type A- Normal external ear canal volume with normal middle ear pressure and tympanic membrane compliance  Pure tone Audiometry: Right ear- Borderline normal to severe sensorineural hearing loss from 250 Hz - 8000 Hz. Left ear-  Mild to moderately severe sensorineural hearing loss from 250 Hz - 8000 Hz.  Speech Audiometry: Right ear- Speech Reception Threshold (SRT) was obtained at 40 dBHL. Left ear-Speech Reception Threshold (SRT) was obtained at 40 dBHL.   Word Recognition Score Tested using NU-6 (MLV) Right ear: 96% was obtained at a presentation level of 80 dBHL with contralateral masking which is deemed as  excellent. Left ear: 92% was obtained at a presentation level of 80 dBHL with contralateral masking which is deemed as  excellent.   The hearing test results were completed under headphones and results are deemed to be  of good to fair reliability. Test technique:  conventional      Recommendations: Follow up with ENT as scheduled for today.  Return for a hearing evaluation if concerns with hearing changes arise or per MD recommendation. Consider a communication needs assessment after medical clearance for hearing aids is obtained.   [place] MARIE LEROUX-MARTINEZ, AUD

## 2023-11-03 NOTE — Progress Notes (Signed)
 Patient ID: Jo Gomez, female   DOB: 01/14/1946, 78 y.o.   MRN: [id number]  Follow-up: Chronic left otitis externa  HPI: The patient is a 78 year old female who returns today for her follow-up evaluation.  She was last seen in January 2025.  At that time, she was complaining of persistent left ear pain and chronic left ear infection.  She was treated with Ciprodex eardrops.  She also underwent a temporal bone CT scan.  The CT scan showed no middle ear or mastoid pathology.  The patient returns today reporting clogging sensation in the left ear.  Her left ear pain has mostly resolved.  She complains of bilateral hearing difficulty.  She would like to have a hearing test.  She recently underwent a VP shunt procedure without difficulty.  Exam: General: Communicates without difficulty, well nourished, no acute distress. Head: Normocephalic, no evidence injury, no tenderness, facial buttresses intact without stepoff. Face/sinus: No tenderness to palpation and percussion. Facial movement is normal and symmetric. Eyes: PERRL, EOMI. No scleral icterus, conjunctivae clear. Neuro: CN II exam reveals vision grossly intact.  No nystagmus at any point of gaze. Ears: Auricles well formed without lesions.  Left ear cerumen impaction.  The right ear canal and tympanic membrane are normal.  Nose: External evaluation reveals normal support and skin without lesions.  Dorsum is intact.  Anterior rhinoscopy reveals congested mucosa over anterior aspect of inferior turbinates and intact septum.  No purulence noted. Oral:  Oral cavity and oropharynx are intact, symmetric, without erythema or edema.  Mucosa is moist without lesions. Neck: Full range of motion without pain.  There is no significant lymphadenopathy.  No masses palpable.  Thyroid bed within normal limits to palpation.  Parotid glands and submandibular glands equal bilaterally without mass.  Trachea is midline. Neuro:  CN 2-12 grossly intact.   Procedure: Left  ear cerumen disimpaction Anesthesia: None Description: Under the operating microscope, the cerumen is carefully removed with a combination of cerumen currette, alligator forceps, and suction catheters.  After the cerumen is removed, the left tympanic membrane is noted to be normal.  No mass, erythema, or lesions. The patient tolerated the procedure well.   Her hearing test shows bilateral high-frequency sensorineural hearing loss.  Assessment: 1.  Left ear cerumen impaction.  After the cerumen removal procedure, the left tympanic membrane is noted to be normal. 2.  Her chronic left ear infection has resolved. 3.  Bilateral high-frequency sensorineural hearing loss.  Plan: 1.  Otomicroscopy with left ear cerumen disimpaction. 2.  The physical exam findings and the CT results are reviewed with the patient. 3.  The hearing test results are also reviewed. 4.  The patient is a candidate for hearing amplification. 5.  The patient will return for reevaluation in 6 months.

## 2023-11-04 ENCOUNTER — Encounter: Payer: Self-pay | Admitting: Audiology

## 2023-11-22 DIAGNOSIS — H353131 Nonexudative age-related macular degeneration, bilateral, early dry stage: Secondary | ICD-10-CM | POA: Diagnosis not present

## 2023-11-22 DIAGNOSIS — H2513 Age-related nuclear cataract, bilateral: Secondary | ICD-10-CM | POA: Diagnosis not present

## 2023-12-01 ENCOUNTER — Ambulatory Visit: Admitting: Internal Medicine

## 2023-12-01 ENCOUNTER — Other Ambulatory Visit: Payer: Self-pay

## 2023-12-01 ENCOUNTER — Encounter: Payer: Self-pay | Admitting: Internal Medicine

## 2023-12-01 VITALS — BP 128/80 | HR 60 | Temp 98.1°F | Ht 60.0 in | Wt 117.0 lb

## 2023-12-01 DIAGNOSIS — R3989 Other symptoms and signs involving the genitourinary system: Secondary | ICD-10-CM | POA: Insufficient documentation

## 2023-12-01 DIAGNOSIS — R300 Dysuria: Secondary | ICD-10-CM

## 2023-12-01 DIAGNOSIS — R32 Unspecified urinary incontinence: Secondary | ICD-10-CM | POA: Insufficient documentation

## 2023-12-01 LAB — POC URINALSYSI DIPSTICK (AUTOMATED)
Bilirubin, UA: NEGATIVE
Blood, UA: NEGATIVE
Glucose, UA: NEGATIVE
Ketones, UA: NEGATIVE
Leukocytes, UA: NEGATIVE
Nitrite, UA: NEGATIVE
Protein, UA: NEGATIVE
Spec Grav, UA: 1.015 (ref 1.010–1.025)
Urobilinogen, UA: 0.2 U/dL
pH, UA: 6 (ref 5.0–8.0)

## 2023-12-01 NOTE — Assessment & Plan Note (Signed)
 Subacute Going on for a while Having discomfort in the bladder, into the vaginal area and even pain with orgasm Also has some fecal incontinence at times Likely has some pelvic floor dysfunction Sometimes has to shift positions in order to urinate-?  Drop in bladder Referral to urogynecology for further evaluation UA negative for infection-will send for culture to confirm

## 2023-12-01 NOTE — Patient Instructions (Addendum)
   Your urine does not look infected.     A referral was ordered Urogynecology and someone will call you to schedule an appointment.    [facility] at [facility] [address] [place], [place] [address] [phone]    Return if symptoms worsen or fail to improve.

## 2023-12-01 NOTE — Assessment & Plan Note (Signed)
 Subacute Going on for a while Having discomfort in the bladder, into the vaginal area and even pain with orgasm Has some urinary incontinence and fecal incontinence at times Likely has some pelvic floor dysfunction Sometimes has to shift positions in order to urinate-?  Drop in bladder Referral to urogynecology for further evaluation UA negative for infection-will send for culture to confirm

## 2023-12-01 NOTE — Addendum Note (Signed)
 Addended by: Karma Ganja on: 12/01/2023 03:33 PM   Modules accepted: Orders

## 2023-12-01 NOTE — Progress Notes (Signed)
 Subjective:    Patient ID: Jo Gomez, female    DOB: 12/15/1945, 78 y.o.   MRN: [id number]      HPI Jo Gomez is here for  Chief Complaint  Patient presents with   Urinary Tract Infection    Bladder pain and discomfort    Her symptoms are more chronic than acute-she has had issues with urinary incontinence, sharp pain in lower pelvis and into the vagina.  Orgasm is painful.  She is able to hold her urine and stool most of the time but occasionally cannot so she does wear a pad.    She has been reading about the symptoms and wondered about pelvic floor issues.  She was not sure if she had an infection or not.  She denies any obvious blood in the urine, significant pain with urination or frequency/fevers.     Medications and allergies reviewed with patient and updated if appropriate.  Current Outpatient Medications on File Prior to Visit  Medication Sig Dispense Refill   alendronate (FOSAMAX) 70 MG tablet Take 1 tablet (70 mg total) by mouth every 7 (seven) days. Take first thing in am with 6 oz. Water.  Be upright after taking.  Eat nothing for one hour. 12 tablet 3   clonazePAM (KLONOPIN) 0.5 MG tablet Take 1 tablet (0.5 mg total) by mouth 2 (two) times daily as needed for anxiety. 60 tablet 1   cyanocobalamin (VITAMIN B12) 1000 MCG/ML injection Inject 1 mL (1,000 mcg total) into the skin every 30 (thirty) days. 3 mL 1   fluticasone (FLOVENT HFA) 110 MCG/ACT inhaler Inhale 2 puffs into the lungs daily. Rinse mouth after each use. 12 g 2   gabapentin (NEURONTIN) 100 MG capsule Take 1 capsule (100 mg total) by mouth 3 (three) times daily. 90 capsule 11   levothyroxine (SYNTHROID) 75 MCG tablet TAKE 1 TABLET (75 MCG) BY MOUTH DAILY BEFORE BREAKFAST 90 tablet 3   traZODone (DESYREL) 150 MG tablet TAKE 1 TABLET BY MOUTH AT BEDTIME (Patient taking differently: Take 75 mg by mouth at bedtime. TAKE 1 TABLET BY MOUTH AT BEDTIME 1/2 table nightly) 90 tablet 3   TUBERCULIN SYR  1CC/25GX5/8" (ULTICARE TUBERCULIN SAFETY SYR) 25G X 5/8" 1 ML MISC Use as directed for monthly B12 injections 12 each 1   Vitamin D, Ergocalciferol, (DRISDOL) 1.25 MG (50000 UNIT) CAPS capsule TAKE 1 CAPSULE BY MOUTH EVERY OTHER WEEK 6 capsule 2   vortioxetine HBr (TRINTELLIX) 10 MG TABS tablet Take 1 tablet (10 mg total) by mouth daily. 90 tablet 1   No current facility-administered medications on file prior to visit.    Review of Systems     Objective:   Vitals:   12/01/23 0925  BP: 128/80  Pulse: 60  Temp: 98.1 F (36.7 C)  SpO2: 97%   BP Readings from Last 3 Encounters:  12/01/23 128/80  11/02/23 119/73  11/01/23 126/76   Wt Readings from Last 3 Encounters:  12/01/23 117 lb (53.1 kg)  11/02/23 117 lb (53.1 kg)  11/01/23 117 lb (53.1 kg)   Body mass index is 22.85 kg/m.    Physical Exam Constitutional:      General: She is not in acute distress.    Appearance: Normal appearance. She is not ill-appearing.  HENT:     Head: Normocephalic and atraumatic.  Abdominal:     General: There is no distension.     Palpations: Abdomen is soft.     Tenderness: There is abdominal tenderness (  In lower pelvis with palpation). There is no guarding or rebound.  Skin:    General: Skin is warm and dry.  Neurological:     Mental Status: She is alert.            Assessment & Plan:    See Problem List for Assessment and Plan of chronic medical problems.

## 2023-12-02 NOTE — Telephone Encounter (Signed)
 Pt was contacted and notified how to fine the questionnaire.

## 2023-12-03 ENCOUNTER — Encounter: Payer: Self-pay | Admitting: Internal Medicine

## 2023-12-03 LAB — CULTURE, URINE COMPREHENSIVE

## 2023-12-21 ENCOUNTER — Telehealth: Payer: Self-pay | Admitting: Physician Assistant

## 2023-12-21 DIAGNOSIS — M5416 Radiculopathy, lumbar region: Secondary | ICD-10-CM | POA: Diagnosis not present

## 2023-12-21 NOTE — Telephone Encounter (Signed)
 Returned call to patient. Her appointment has been moved to Tuesday, 12/28/23 at 9:30 am with Dr. Yvone Herd. Patient had no other concerns at the end of the call.

## 2023-12-21 NOTE — Telephone Encounter (Signed)
 Inbound call from patient stating that she is a previous patient of Dr. Sandrea Cruel and wanted to make an appointment with Dr. Yvone Herd. I advised her that Dr. Yvone Herd was currently scheduling for 6/30. Patient agreed to see [name] for 5/22 at 2:30 due to having abdominal pain and mucus in stool. Patient requested that I send a message to the nurse, and is requesting a call back to discuss symptoms and to see if she can be seen sooner with Dr. Yvone Herd. Please advise.

## 2023-12-22 ENCOUNTER — Encounter: Payer: Self-pay | Admitting: Obstetrics and Gynecology

## 2023-12-22 ENCOUNTER — Other Ambulatory Visit (HOSPITAL_COMMUNITY): Payer: Self-pay

## 2023-12-22 ENCOUNTER — Ambulatory Visit: Admitting: Obstetrics and Gynecology

## 2023-12-22 VITALS — BP 137/78 | HR 73 | Ht 59.2 in | Wt 116.2 lb

## 2023-12-22 DIAGNOSIS — R339 Retention of urine, unspecified: Secondary | ICD-10-CM

## 2023-12-22 DIAGNOSIS — R350 Frequency of micturition: Secondary | ICD-10-CM | POA: Diagnosis not present

## 2023-12-22 DIAGNOSIS — N816 Rectocele: Secondary | ICD-10-CM

## 2023-12-22 DIAGNOSIS — M6289 Other specified disorders of muscle: Secondary | ICD-10-CM | POA: Diagnosis not present

## 2023-12-22 LAB — POCT URINALYSIS DIPSTICK
Bilirubin, UA: NEGATIVE
Blood, UA: NEGATIVE
Glucose, UA: NEGATIVE
Ketones, UA: NEGATIVE
Leukocytes, UA: NEGATIVE — AB
Nitrite, UA: NEGATIVE
Protein, UA: NEGATIVE
Spec Grav, UA: <=1.005 — AB (ref 1.010–1.025)
Urobilinogen, UA: 0.2 U/dL
pH, UA: 6 (ref 5.0–8.0)

## 2023-12-22 MED ORDER — CYCLOBENZAPRINE HCL 5 MG PO TABS
5.0000 mg | ORAL_TABLET | Freq: Three times a day (TID) | ORAL | 1 refills | Status: DC | PRN
  Filled 2023-12-22: qty 90, 30d supply, fill #0
  Filled 2024-02-14: qty 90, 30d supply, fill #1
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
## 2023-12-22 NOTE — Patient Instructions (Addendum)
 For urinary retention: Please self cath 2-3 times per day. I want you to urinate into the hat first and see how much you pee. Record this number. Then you will do the catheter over the toilet and record how much you urinated.   Bladder emptying/function: We are going to do bladder testing to see the functionality of your bladder. This is called urodynamics.   Pelvic pain/spasms: We will start with pelvic floor PT and try the flexeril at night to see if this is helpful for the pelvic floor pain and tension.
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
## 2023-12-28 ENCOUNTER — Ambulatory Visit (INDEPENDENT_AMBULATORY_CARE_PROVIDER_SITE_OTHER)

## 2023-12-28 ENCOUNTER — Other Ambulatory Visit (HOSPITAL_COMMUNITY): Payer: Self-pay

## 2023-12-28 ENCOUNTER — Other Ambulatory Visit (HOSPITAL_COMMUNITY)
Admission: RE | Admit: 2023-12-28 | Discharge: 2023-12-28 | Disposition: A | Discharge status: Home / Self Care | Source: Other Acute Inpatient Hospital | Attending: Obstetrics and Gynecology | Admitting: Obstetrics and Gynecology

## 2023-12-28 ENCOUNTER — Encounter: Payer: Self-pay | Admitting: Pediatrics

## 2023-12-28 ENCOUNTER — Ambulatory Visit: Admitting: Pediatrics

## 2023-12-28 ENCOUNTER — Other Ambulatory Visit: Payer: Self-pay | Admitting: Internal Medicine

## 2023-12-28 VITALS — BP 122/80 | HR 72

## 2023-12-28 VITALS — BP 102/64 | HR 80 | Ht 60.0 in | Wt 116.6 lb

## 2023-12-28 DIAGNOSIS — Z83719 Family history of colon polyps, unspecified: Secondary | ICD-10-CM | POA: Diagnosis not present

## 2023-12-28 DIAGNOSIS — R1030 Lower abdominal pain, unspecified: Secondary | ICD-10-CM | POA: Diagnosis not present

## 2023-12-28 DIAGNOSIS — R110 Nausea: Secondary | ICD-10-CM | POA: Diagnosis not present

## 2023-12-28 DIAGNOSIS — K219 Gastro-esophageal reflux disease without esophagitis: Secondary | ICD-10-CM

## 2023-12-28 DIAGNOSIS — R82998 Other abnormal findings in urine: Secondary | ICD-10-CM | POA: Insufficient documentation

## 2023-12-28 DIAGNOSIS — K5900 Constipation, unspecified: Secondary | ICD-10-CM | POA: Diagnosis not present

## 2023-12-28 DIAGNOSIS — R350 Frequency of micturition: Secondary | ICD-10-CM

## 2023-12-28 DIAGNOSIS — K5730 Diverticulosis of large intestine without perforation or abscess without bleeding: Secondary | ICD-10-CM

## 2023-12-28 DIAGNOSIS — R109 Unspecified abdominal pain: Secondary | ICD-10-CM

## 2023-12-28 DIAGNOSIS — R194 Change in bowel habit: Secondary | ICD-10-CM

## 2023-12-28 DIAGNOSIS — R319 Hematuria, unspecified: Secondary | ICD-10-CM

## 2023-12-28 DIAGNOSIS — M5416 Radiculopathy, lumbar region: Secondary | ICD-10-CM | POA: Diagnosis not present

## 2023-12-28 DIAGNOSIS — M5116 Intervertebral disc disorders with radiculopathy, lumbar region: Secondary | ICD-10-CM | POA: Diagnosis not present

## 2023-12-28 LAB — POCT URINALYSIS DIPSTICK
Bilirubin, UA: NEGATIVE
Glucose, UA: NEGATIVE
Ketones, UA: NEGATIVE
Nitrite, UA: NEGATIVE
Protein, UA: NEGATIVE
Spec Grav, UA: 1.01 (ref 1.010–1.025)
Urobilinogen, UA: 0.2 U/dL
pH, UA: 5.5 (ref 5.0–8.0)

## 2023-12-28 LAB — URINALYSIS, ROUTINE W REFLEX MICROSCOPIC
Bilirubin Urine: NEGATIVE
Glucose, UA: NEGATIVE mg/dL
Ketones, ur: NEGATIVE mg/dL
Nitrite: POSITIVE — AB
Protein, ur: NEGATIVE mg/dL
Specific Gravity, Urine: 1.015 (ref 1.005–1.030)
pH: 6 (ref 5.0–8.0)

## 2023-12-28 LAB — URINALYSIS, MICROSCOPIC (REFLEX): WBC, UA: >50 WBC/hpf (ref 0–5)

## 2023-12-28 MED ORDER — CYANOCOBALAMIN 1000 MCG/ML IJ SOLN
1000.0000 ug | INTRAMUSCULAR | 1 refills | Status: DC
  Filled 2023-12-28: qty 3, 90d supply, fill #0
  Filled 2024-03-21: qty 3, 90d supply, fill #1

## 2023-12-28 MED ORDER — NITROFURANTOIN MONOHYD MACRO 100 MG PO CAPS
100.0000 mg | ORAL_CAPSULE | Freq: Two times a day (BID) | ORAL | 0 refills | Status: DC
  Filled 2023-12-28: qty 10, 5d supply, fill #0

## 2023-12-28 NOTE — Patient Instructions (Addendum)
 You have been scheduled for a CT scan of the abdomen and pelvis at [facility], 1st floor Radiology. You are scheduled on Thursday, 12/30/2023 at 5:30pm. You should arrive 2 hours prior to your appointment time for registration and to drink your contrast..   Please follow the written instructions below on the day of your exam:   1) Do not eat anything after 1:30pm (4 hours prior to your test)     You may take any medications as prescribed with a small amount of water, if necessary. If you take any of the following medications: METFORMIN, GLUCOPHAGE, GLUCOVANCE, AVANDAMET, RIOMET, FORTAMET, ACTOPLUS MET, JANUMET, GLUMETZA or METAGLIP, you MAY be asked to HOLD this medication 48 hours AFTER the exam.   The purpose of you drinking the oral contrast is to aid in the visualization of your intestinal tract. The contrast solution may cause some diarrhea. Depending on your individual set of symptoms, you may also receive an intravenous injection of x-ray contrast/dye. Plan on being at [facility] for 45 minutes or longer, depending on the type of exam you are having performed.   If you have any questions regarding your exam or if you need to reschedule, you may call [facility] at [phone] between the hours of 8:00 am and 5:00 pm, Monday-Friday.   Follow up in 2 months.   Thank you for entrusting me with your care and for choosing [facility], Dr. Eugenia Hess   _______________________________________________________  If your blood pressure at your visit was 140/90 or greater, please contact your primary care physician to follow up on this.  _______________________________________________________  If you are age 89 or older, your body mass index should be between 23-30. Your Body mass index is 22.77 kg/m. If this is out of the aforementioned range listed, please consider follow up with your Primary Care Provider.  If you are age 93 or younger, your body mass index  should be between 19-25. Your Body mass index is 22.77 kg/m. If this is out of the aformentioned range listed, please consider follow up with your Primary Care Provider.   ________________________________________________________  The [facility] providers would like to encourage you to use MYCHART to communicate with providers for non-urgent requests or questions.  Due to long hold times on the telephone, sending your provider a message by [facility] may be a faster and more efficient way to get a response.  Please allow 48 business hours for a response.  Please remember that this is for non-urgent requests.  _______________________________________________________

## 2023-12-28 NOTE — Patient Instructions (Signed)
 Your Urine dip that was done in office was Positive. I am sending the urine off for culture and you can take AZO over the counter for your discomfort.  We have ordered macrobid for you to take while we wait for your culture results, hopefully this gives you some relief. We will contact you when the results are back between 3-5 days.  If a different antibiotic is needed we will sent the order to the pharmacy and you will be notified. If you have any questions or concerns please feel free to call us  at [phone]

## 2023-12-28 NOTE — Progress Notes (Signed)
 Jo Gomez is a 78 y.o. female  arrived today with UTI sx.  Per Dr. Sunnie England protocol: A urine specimen was collected and POCT Urine was done and urine culture sent to the lab. POCT Urine was POSITIVE for Leukocytes Pt was notified and prescription sent to the preferred pharmacy.

## 2023-12-30 ENCOUNTER — Ambulatory Visit (HOSPITAL_COMMUNITY)

## 2023-12-30 DIAGNOSIS — H2512 Age-related nuclear cataract, left eye: Secondary | ICD-10-CM | POA: Diagnosis not present

## 2023-12-30 LAB — URINE CULTURE: Culture: >=100000 — AB

## 2023-12-31 ENCOUNTER — Encounter: Payer: Self-pay | Admitting: Obstetrics and Gynecology

## 2023-12-31 ENCOUNTER — Other Ambulatory Visit: Payer: Self-pay

## 2023-12-31 ENCOUNTER — Other Ambulatory Visit (HOSPITAL_COMMUNITY): Payer: Self-pay

## 2023-12-31 ENCOUNTER — Other Ambulatory Visit: Payer: Self-pay | Admitting: Obstetrics and Gynecology

## 2023-12-31 MED ORDER — NITROFURANTOIN MONOHYD MACRO 100 MG PO CAPS
100.0000 mg | ORAL_CAPSULE | Freq: Two times a day (BID) | ORAL | 0 refills | Status: DC
  Filled 2023-12-31 – 2024-01-01 (×3): qty 20, 10d supply, fill #0

## 2023-12-31 NOTE — Addendum Note (Signed)
 Addended by: Nadeem Romanoski G on: 12/31/2023 11:03 AM   Modules accepted: Orders

## 2024-01-01 ENCOUNTER — Other Ambulatory Visit (HOSPITAL_COMMUNITY): Payer: Self-pay

## 2024-01-04 ENCOUNTER — Ambulatory Visit (HOSPITAL_COMMUNITY)
Admission: RE | Admit: 2024-01-04 | Discharge: 2024-01-04 | Disposition: A | Discharge status: Home / Self Care | Source: Ambulatory Visit | Attending: Pediatrics | Admitting: Pediatrics

## 2024-01-04 ENCOUNTER — Encounter (HOSPITAL_COMMUNITY): Payer: Self-pay

## 2024-01-04 DIAGNOSIS — R109 Unspecified abdominal pain: Secondary | ICD-10-CM | POA: Diagnosis not present

## 2024-01-04 DIAGNOSIS — R194 Change in bowel habit: Secondary | ICD-10-CM

## 2024-01-04 DIAGNOSIS — N3289 Other specified disorders of bladder: Secondary | ICD-10-CM | POA: Diagnosis not present

## 2024-01-04 DIAGNOSIS — Z90710 Acquired absence of both cervix and uterus: Secondary | ICD-10-CM | POA: Diagnosis not present

## 2024-01-04 DIAGNOSIS — K7689 Other specified diseases of liver: Secondary | ICD-10-CM | POA: Diagnosis not present

## 2024-01-04 MED ORDER — IOHEXOL 300 MG/ML  SOLN
100.0000 mL | Freq: Once | INTRAMUSCULAR | Status: AC | PRN
  Administered 2024-01-04: 100 mL via INTRAVENOUS

## 2024-01-04 MED ORDER — IOHEXOL 9 MG/ML PO SOLN
ORAL | Status: AC
  Filled 2024-01-04: qty 1000

## 2024-01-05 ENCOUNTER — Telehealth: Payer: Self-pay | Admitting: Pediatrics

## 2024-01-05 ENCOUNTER — Ambulatory Visit: Payer: Self-pay | Admitting: Pediatrics

## 2024-01-05 DIAGNOSIS — R194 Change in bowel habit: Secondary | ICD-10-CM

## 2024-01-05 DIAGNOSIS — R5381 Other malaise: Secondary | ICD-10-CM

## 2024-01-05 DIAGNOSIS — R109 Unspecified abdominal pain: Secondary | ICD-10-CM

## 2024-01-05 NOTE — Telephone Encounter (Signed)
 I spoke on the telephone with Jo Gomez to review the results of her recent CT scan.  This showed inflammatory changes in the cecum concerning for colitis versus cystitis.  There was also bladder wall thickening that could be due to underdistention or cystitis.  She informs me that she is currently taking nitrofurantoin  for urinary issues.  States that previously she was having loose and jellylike stools with mucus.  Over the last 2 days she has not had any bowel movements.  Denies any symptoms of acute gastroenteritis.  Reports that overall she has a sense of generalized malaise and diminished energy.  Noted on previous labs that she was mildly hyponatremic.  In the setting of her recent CT findings I have recommended doing stool tests to evaluate for infectious pathogens as well as baseline laboratory tests.  Will coordinate labs for stool bacterial culture, C. difficile, Giardia, Cryptosporidium, CBC, CMP, ESR, CRP, thyroid  profile, iron panel, vitamin B12 through our office.

## 2024-01-06 ENCOUNTER — Ambulatory Visit: Admitting: Physical Therapy

## 2024-01-06 ENCOUNTER — Other Ambulatory Visit (INDEPENDENT_AMBULATORY_CARE_PROVIDER_SITE_OTHER)

## 2024-01-06 ENCOUNTER — Telehealth: Payer: Self-pay

## 2024-01-06 ENCOUNTER — Ambulatory Visit: Payer: Self-pay | Admitting: Pediatrics

## 2024-01-06 DIAGNOSIS — R5381 Other malaise: Secondary | ICD-10-CM | POA: Diagnosis not present

## 2024-01-06 DIAGNOSIS — R194 Change in bowel habit: Secondary | ICD-10-CM

## 2024-01-06 DIAGNOSIS — R109 Unspecified abdominal pain: Secondary | ICD-10-CM

## 2024-01-06 LAB — CBC WITH DIFFERENTIAL/PLATELET
Basophils Absolute: 0 10*3/uL (ref 0.0–0.1)
Basophils Relative: 0.1 % (ref 0.0–3.0)
Eosinophils Absolute: 0 10*3/uL (ref 0.0–0.7)
Eosinophils Relative: 0.1 % (ref 0.0–5.0)
HCT: 39.8 % (ref 36.0–46.0)
Hemoglobin: 13.7 g/dL (ref 12.0–15.0)
Lymphocytes Relative: 4.8 % — ABNORMAL LOW (ref 12.0–46.0)
Lymphs Abs: 1 10*3/uL (ref 0.7–4.0)
MCHC: 34.5 g/dL (ref 30.0–36.0)
MCV: 87.3 fl (ref 78.0–100.0)
Monocytes Absolute: 2.1 10*3/uL — ABNORMAL HIGH (ref 0.1–1.0)
Monocytes Relative: 9.8 % (ref 3.0–12.0)
Neutro Abs: 18.7 10*3/uL — ABNORMAL HIGH (ref 1.4–7.7)
Neutrophils Relative %: 85.2 % — ABNORMAL HIGH (ref 43.0–77.0)
Platelets: 390 10*3/uL (ref 150.0–400.0)
RBC: 4.56 Mil/uL (ref 3.87–5.11)
RDW: 13.8 % (ref 11.5–15.5)
WBC: 21.9 10*3/uL (ref 4.0–10.5)

## 2024-01-06 LAB — C-REACTIVE PROTEIN: CRP: <1 mg/dL (ref 0.5–20.0)

## 2024-01-06 LAB — COMPREHENSIVE METABOLIC PANEL WITH GFR
ALT: 21 U/L (ref 0–35)
AST: 12 U/L (ref 0–37)
Albumin: 3.8 g/dL (ref 3.5–5.2)
Alkaline Phosphatase: 65 U/L (ref 39–117)
BUN: 17 mg/dL (ref 6–23)
CO2: 25 meq/L (ref 19–32)
Calcium: 8.9 mg/dL (ref 8.4–10.5)
Chloride: 96 meq/L (ref 96–112)
Creatinine, Ser: 0.74 mg/dL (ref 0.40–1.20)
GFR: 78.9 mL/min (ref >60.00)
Glucose, Bld: 106 mg/dL — ABNORMAL HIGH (ref 70–99)
Potassium: 4.3 meq/L (ref 3.5–5.1)
Sodium: 129 meq/L — ABNORMAL LOW (ref 135–145)
Total Bilirubin: 0.3 mg/dL (ref 0.2–1.2)
Total Protein: 6.6 g/dL (ref 6.0–8.3)

## 2024-01-06 LAB — SEDIMENTATION RATE: Sed Rate: 17 mm/h (ref 0–30)

## 2024-01-06 LAB — IBC + FERRITIN
Ferritin: 106.9 ng/mL (ref 10.0–291.0)
Iron: 118 ug/dL (ref 42–145)
Saturation Ratios: 34 % (ref 20.0–50.0)
TIBC: 347.2 ug/dL (ref 250.0–450.0)
Transferrin: 248 mg/dL (ref 212.0–360.0)

## 2024-01-06 LAB — VITAMIN B12: Vitamin B-12: 707 pg/mL (ref 211–911)

## 2024-01-06 NOTE — Telephone Encounter (Signed)
 I received a call from the lab they stated the pt has a critical WBC count of 22.0 please advise

## 2024-01-06 NOTE — Telephone Encounter (Signed)
 Orders have been placed. MyChart message sent to patient with recommendations.

## 2024-01-06 NOTE — Addendum Note (Signed)
 Addended by: Miela Desjardin N on: 01/06/2024 10:20 AM   Modules accepted: Orders

## 2024-01-07 ENCOUNTER — Ambulatory Visit: Payer: Self-pay | Admitting: Pediatrics

## 2024-01-07 LAB — GIARDIA AND CRYPTOSPORIDIUM ANTIGEN PANEL
MICRO NUMBER:: 16460659
RESULT:: NOT DETECTED
SPECIMEN QUALITY:: ADEQUATE
Specimen Quality:: ADEQUATE
micro Number:: 16460658

## 2024-01-07 LAB — THYROID PROFILE - CHCC
Free Thyroxine Index: 3.4 (ref 1.4–3.8)
T3 Uptake: 37 % — ABNORMAL HIGH (ref 22–35)
T4, Total: 9.3 ug/dL (ref 5.1–11.9)

## 2024-01-08 LAB — CLOSTRIDIUM DIFFICILE BY PCR: Toxigenic C. Difficile by PCR: POSITIVE — AB

## 2024-01-10 ENCOUNTER — Telehealth: Payer: Self-pay

## 2024-01-10 ENCOUNTER — Other Ambulatory Visit (HOSPITAL_COMMUNITY): Payer: Self-pay

## 2024-01-10 ENCOUNTER — Telehealth: Payer: Self-pay | Admitting: Gastroenterology

## 2024-01-10 DIAGNOSIS — A498 Other bacterial infections of unspecified site: Secondary | ICD-10-CM

## 2024-01-10 MED ORDER — SACCHAROMYCES BOULARDII 250 MG PO CAPS
250.0000 mg | ORAL_CAPSULE | Freq: Two times a day (BID) | ORAL | 1 refills | Status: DC
Start: 2024-01-10 — End: 2024-03-13
  Filled 2024-01-10: qty 60, 30d supply, fill #0
  Filled 2024-02-14: qty 60, 30d supply, fill #1

## 2024-01-10 MED ORDER — VANCOMYCIN HCL 125 MG PO CAPS
125.0000 mg | ORAL_CAPSULE | Freq: Four times a day (QID) | ORAL | 0 refills | Status: AC
Start: 2024-01-10 — End: 2024-01-20
  Filled 2024-01-10: qty 40, 10d supply, fill #0

## 2024-01-10 NOTE — Telephone Encounter (Signed)
 Pt stated that she had concerns and questions about taking the antibiotics. All questions were answered.  Pt verbalized understanding with all questions answered.

## 2024-01-10 NOTE — Telephone Encounter (Signed)
 Inbound call from patient stating she has concerns about taking antibiotics. States she is no longer experiencing symptoms and requesting to know if she should do another stool test prior to her starting antibiotic regimen. Please advise, thank you.

## 2024-01-10 NOTE — Telephone Encounter (Signed)
 Spoke with patient about her stool test positive for cdiff. Will treat with 10 days of Vancomycin . No known allergies to this abx. Also add florastor twice daily. We discussed hand hygiene and using separate bathrooms form her spouse. Verbalizes understanding.

## 2024-01-10 NOTE — Telephone Encounter (Signed)
+   C. Diff result - results are in epic.

## 2024-01-11 ENCOUNTER — Encounter: Payer: Self-pay | Admitting: Pediatrics

## 2024-01-11 LAB — STOOL CULTURE: E coli, Shiga toxin Assay: NEGATIVE

## 2024-01-13 ENCOUNTER — Ambulatory Visit: Admitting: Physician Assistant

## 2024-01-20 ENCOUNTER — Other Ambulatory Visit (HOSPITAL_COMMUNITY): Payer: Self-pay

## 2024-01-21 ENCOUNTER — Ambulatory Visit

## 2024-01-24 ENCOUNTER — Ambulatory Visit (INDEPENDENT_AMBULATORY_CARE_PROVIDER_SITE_OTHER)

## 2024-01-24 ENCOUNTER — Other Ambulatory Visit (HOSPITAL_COMMUNITY)
Admission: RE | Admit: 2024-01-24 | Discharge: 2024-01-24 | Disposition: A | Discharge status: Home / Self Care | Source: Other Acute Inpatient Hospital | Attending: Obstetrics and Gynecology | Admitting: Obstetrics and Gynecology

## 2024-01-24 DIAGNOSIS — R82998 Other abnormal findings in urine: Secondary | ICD-10-CM | POA: Diagnosis not present

## 2024-01-24 DIAGNOSIS — H2511 Age-related nuclear cataract, right eye: Secondary | ICD-10-CM | POA: Diagnosis not present

## 2024-01-24 DIAGNOSIS — R300 Dysuria: Secondary | ICD-10-CM

## 2024-01-24 LAB — POCT URINALYSIS DIP (CLINITEK)
Bilirubin, UA: NEGATIVE
Blood, UA: NEGATIVE
Glucose, UA: NEGATIVE mg/dL
Ketones, POC UA: NEGATIVE mg/dL
Nitrite, UA: NEGATIVE
Spec Grav, UA: 1.015 (ref 1.010–1.025)
Urobilinogen, UA: 0.2 U/dL
pH, UA: 7.5 (ref 5.0–8.0)

## 2024-01-24 NOTE — Progress Notes (Signed)
 Dillie arrived today with dysuria. Patient is notexperiencing fever, unstable vitals and/or one-sided back flank pain. Patient has not had had a recent hospitalization due to UTI.  Last visit in the office was 12/31/2023.  Per protocol:   The most recent Urinalysis completed on 5--2025 and was notnormal.  Last Creatinine level  Lab Results  Component Value Date   CREATININE 0.74 01/06/2024    An urine specimen was collected and POCT urinalysis completed. [] A cath specimen was collected due to patient's current condition, symptoms or post-procedural state.  Total urine output by catheter is  Output by Drain (mL) 01/22/24 0701 - 01/22/24 1900 01/22/24 1901 - 01/23/24 0700 01/23/24 0701 - 01/23/24 1900 01/23/24 1901 - 01/24/24 0700 01/24/24 0701 - 01/24/24 1351  Patient has no LDAs of requested type attached.    Aaron Aas    POCT Urine results is normal.  Urine micro was not sent per protocol for abnormal urinalysis.  Urine culture was sent per protocol for abnormal urinalysis.     [x] Pt was notified of positive urine results and plan for additional urine testing. We will contact you within the next 3-4 days with these results.  [x] No Prescription was sent to your pharmacy.  The additional testing will indicate if a prescription is needed.   [] Patient was notified of abnormal urine results. The following prescription is sent to your preferred pharmacy.  []  Macrobid  100mg  #10 1 tablet by mouth twice daily with food for 5 days      []  Bactrim DS 800-160mg  #6 1 tablet by mouth twice daily for 3 days        []  Due to your current medication allergies, an alternate prescription was discussed with your provider and will be prescribed and sent to your pharmacy.  [] You can take over the counter AZO two tablets up to three times a day for two days.  Take AZO tablets with a full glass of water. AZO will turn your urine orange, this is normal.   [] The patient was notified of negative urine results.  If  symptoms persist, you may take over the counter AZO two tablets up to three times a day for two days.  AZO will turn your urine orange, this is normal.  Contact the office back to schedule an appointment if your symptoms persist or worsen or you develop additional symptoms.       CC'd note to patient's provider.

## 2024-01-25 LAB — URINE CULTURE

## 2024-01-26 ENCOUNTER — Ambulatory Visit: Payer: Self-pay

## 2024-01-26 ENCOUNTER — Ambulatory Visit (INDEPENDENT_AMBULATORY_CARE_PROVIDER_SITE_OTHER)

## 2024-01-26 ENCOUNTER — Other Ambulatory Visit (HOSPITAL_COMMUNITY)
Admission: RE | Admit: 2024-01-26 | Discharge: 2024-01-26 | Disposition: A | Discharge status: Home / Self Care | Source: Other Acute Inpatient Hospital | Attending: Obstetrics and Gynecology | Admitting: Obstetrics and Gynecology

## 2024-01-26 DIAGNOSIS — R82998 Other abnormal findings in urine: Secondary | ICD-10-CM | POA: Diagnosis not present

## 2024-01-26 DIAGNOSIS — R300 Dysuria: Secondary | ICD-10-CM

## 2024-01-26 LAB — POCT URINALYSIS DIP (CLINITEK)
Bilirubin, UA: NEGATIVE
Blood, UA: NEGATIVE
Glucose, UA: NEGATIVE mg/dL
Ketones, POC UA: NEGATIVE mg/dL
Leukocytes, UA: NEGATIVE
Nitrite, UA: NEGATIVE
Spec Grav, UA: 1.01
Urobilinogen, UA: 0.2 U/dL
pH, UA: 7

## 2024-01-26 NOTE — Progress Notes (Signed)
 Jo Gomez is a 78 y.o. female here today for a urine recollection due to previous voided collection due to labs showing multiple species present. An in and out cath was completed and collected 225cc via a straight cath. POCT testing was negative and we have sent for culture due to patients symptoms that have not improved since Monday.

## 2024-01-26 NOTE — Patient Instructions (Addendum)
 Please keep scheduled follow ups.  It was a pleasure to see you today!  Thank you for trusting me with your care!

## 2024-01-27 ENCOUNTER — Ambulatory Visit: Payer: Self-pay | Admitting: Obstetrics and Gynecology

## 2024-01-27 DIAGNOSIS — H2511 Age-related nuclear cataract, right eye: Secondary | ICD-10-CM | POA: Diagnosis not present

## 2024-01-27 LAB — URINE CULTURE: Culture: NO GROWTH

## 2024-02-01 ENCOUNTER — Ambulatory Visit: Admitting: Family Medicine

## 2024-02-01 ENCOUNTER — Other Ambulatory Visit: Payer: Self-pay

## 2024-02-01 ENCOUNTER — Other Ambulatory Visit (HOSPITAL_COMMUNITY): Payer: Self-pay

## 2024-02-01 ENCOUNTER — Encounter: Payer: Self-pay | Admitting: Family Medicine

## 2024-02-01 VITALS — BP 136/68 | Ht 60.0 in | Wt 110.0 lb

## 2024-02-01 DIAGNOSIS — M25561 Pain in right knee: Secondary | ICD-10-CM

## 2024-02-01 MED ORDER — MELOXICAM 15 MG PO TABS
15.0000 mg | ORAL_TABLET | Freq: Every day | ORAL | 1 refills | Status: DC
  Filled 2024-02-01: qty 30, 30d supply, fill #0

## 2024-02-01 NOTE — Progress Notes (Signed)
 DATE OF VISIT: 02/01/2024        Jo Gomez DOB: February 29, 1946 MRN: [id number]  CC:  Rt knee pain  History of present Illness: Jo Gomez is a 78 y.o. female who presents for Rt medial knee pain x 4 days No injury/trauma Feels like she is "getting stabbed in the knee" Denies swelling Denies clicking, catching, locking, instability Tried Tylenol  2 tabs last night - helped some Has been able to continue doing yoga in the AM No prior knee issues No prior imaging  Medications:  Outpatient Encounter Medications as of 02/01/2024  Medication Sig   meloxicam (MOBIC) 15 MG tablet Take 1 tablet (15 mg total) by mouth daily.   alendronate  (FOSAMAX ) 70 MG tablet Take 1 tablet (70 mg total) by mouth every 7 (seven) days. Take first thing in am with 6 oz. Water.  Be upright after taking.  Eat nothing for one hour.   cyanocobalamin  (VITAMIN B12) 1000 MCG/ML injection Inject 1 mL (1,000 mcg total) into the skin every 30 (thirty) days.   cyclobenzaprine  (FLEXERIL ) 5 MG tablet Take 1 tablet (5 mg total) by mouth 3 (three) times daily as needed for muscle spasms.   EST ESTROGENS -METHYLTEST HS 0.625-1.25 MG tablet Take 1 tablet by mouth 3 (three) times a week.   fluticasone  (FLOVENT  HFA) 110 MCG/ACT inhaler Inhale 2 puffs into the lungs daily. Rinse mouth after each use.   levothyroxine  (SYNTHROID ) 75 MCG tablet TAKE 1 TABLET (75 MCG) BY MOUTH DAILY BEFORE BREAKFAST   nitrofurantoin , macrocrystal-monohydrate, (MACROBID ) 100 MG capsule Take 1 capsule (100 mg total) by mouth 2 (two) times daily.   saccharomyces boulardii (FLORASTOR) 250 MG capsule Take 1 capsule (250 mg total) by mouth 2 (two) times daily.   traZODone  (DESYREL ) 150 MG tablet TAKE 1 TABLET BY MOUTH AT BEDTIME (Patient taking differently: Take 75 mg by mouth at bedtime. TAKE 1 TABLET BY MOUTH AT BEDTIME 1/2 table nightly)   TUBERCULIN SYR 1CC/25GX5/8" (ULTICARE TUBERCULIN SAFETY SYR) 25G X 5/8" 1 ML MISC Use as directed for monthly B12  injections   Vitamin D , Ergocalciferol , (DRISDOL ) 1.25 MG (50000 UNIT) CAPS capsule TAKE 1 CAPSULE BY MOUTH EVERY OTHER WEEK   No facility-administered encounter medications on file as of 02/01/2024.    Allergies: is allergic to gammagard s-d less iga [immune globulin (human)], clarithromycin, erythromycin base, levaquin [levofloxacin in d5w], other, sulfonamide derivatives, and tetracycline.  Physical Examination: Vitals: BP 136/68   Ht 5' (1.524 m)   Wt 110 lb (49.9 kg)   LMP 08/24/1997   BMI 21.48 kg/m  GENERAL:  Jo Gomez is a 78 y.o. female appearing their stated age, alert and oriented x 3, in no apparent distress.  SKIN: no rashes or lesions, skin clean, dry, intact MSK: KNEE: Rt knee with small effusion, no increased redness or warmth.  FROM with pain at terminal flexion.  (+)TTP along medial joint line and along pes bursa, no TTP along lateral joint line.  Neg Lachman, neg varus/valgus stress, (+)pain with McMurray. Lt knee with FROM without pain, weakness, instability NEURO: sensation intact to light touch lower ext bilaterally VASC: pulses 2+ and symmetric DP/PT bilaterally, no edema  Radiology: LIMITED MSK U/S RT KNEE DATE: 02/01/24 INDICATION: RT KNEE PAIN FINDINGS: - small effusion in suprapatellar pouch - lateral joint line without acute abnormality, normal appearing lateral meniscus - medial joint line with slight extrusion of meniscus, associated peri-meniscal cyst, likely degenerative tear of medial meniscus - normal appearing patellar tendon  IMPRESSION: -  small effusion with likely degenerative medial meniscus tear Images and interpretation completed by Marvel Slicker, DO    Assessment & Plan Acute pain of right knee Right medial knee pain x 4 days, suspect underlying degenerative medial meniscus tear and possible underlying osteoarthritis  Plan: - MSK ultrasound completed as noted above - Rx Mobic 15 mg 1 tab p.o. daily with food x 1 to 2 weeks,  then as needed thereafter - Imaging: Right knee x-rays standing rule out osteoarthritis.  I will contact her with results once available - Recommended use of a knee sleeve for added support and stability -Ice as needed - Follow-up 2 to 4 weeks if no improvement, could consider cortisone injection if worsening or not improving   Patient expressed understanding & agreement with above.  Encounter Diagnosis  Name Primary?   Acute pain of right knee Yes    Orders Placed This Encounter  Procedures   US  LIMITED JOINT SPACE STRUCTURES LOW RIGHT   DG Knee 4 Views W/Patella Right

## 2024-02-02 ENCOUNTER — Ambulatory Visit (HOSPITAL_BASED_OUTPATIENT_CLINIC_OR_DEPARTMENT_OTHER)
Admission: RE | Admit: 2024-02-02 | Discharge: 2024-02-02 | Disposition: A | Discharge status: Home / Self Care | Source: Ambulatory Visit | Attending: Family Medicine | Admitting: Family Medicine

## 2024-02-02 ENCOUNTER — Ambulatory Visit (INDEPENDENT_AMBULATORY_CARE_PROVIDER_SITE_OTHER): Admitting: Obstetrics and Gynecology

## 2024-02-02 ENCOUNTER — Encounter: Payer: Self-pay | Admitting: Obstetrics and Gynecology

## 2024-02-02 VITALS — BP 139/81 | HR 76

## 2024-02-02 DIAGNOSIS — R948 Abnormal results of function studies of other organs and systems: Secondary | ICD-10-CM

## 2024-02-02 DIAGNOSIS — R339 Retention of urine, unspecified: Secondary | ICD-10-CM

## 2024-02-02 DIAGNOSIS — M6289 Other specified disorders of muscle: Secondary | ICD-10-CM

## 2024-02-02 DIAGNOSIS — N816 Rectocele: Secondary | ICD-10-CM

## 2024-02-02 DIAGNOSIS — N319 Neuromuscular dysfunction of bladder, unspecified: Secondary | ICD-10-CM | POA: Diagnosis not present

## 2024-02-02 DIAGNOSIS — M25561 Pain in right knee: Secondary | ICD-10-CM | POA: Diagnosis not present

## 2024-02-02 DIAGNOSIS — R350 Frequency of micturition: Secondary | ICD-10-CM

## 2024-02-02 LAB — POCT URINALYSIS DIP (CLINITEK)
Bilirubin, UA: NEGATIVE
Blood, UA: NEGATIVE
Glucose, UA: NEGATIVE mg/dL
Ketones, POC UA: NEGATIVE mg/dL
Leukocytes, UA: NEGATIVE
Nitrite, UA: NEGATIVE
Spec Grav, UA: 1.015 (ref 1.010–1.025)
Urobilinogen, UA: 0.2 U/dL
pH, UA: 7 (ref 5.0–8.0)

## 2024-02-02 NOTE — Progress Notes (Signed)
 [facility] Urodynamics Procedure  Referring Physician: Colene Dauphin, MD Date of Procedure: 02/02/2024  Jo Gomez is a 78 y.o. female who presents for urodynamic evaluation. Indication(s) for study: neurogenic bladder  Vital Signs: BP 139/81 (BP Location: Right Arm, Patient Position: Sitting, Cuff Size: Normal)   Pulse 76   LMP 08/24/1997   Laboratory Results: A catheterized urine specimen revealed:  POC urine:  Lab Results  Component Value Date   COLORU yellow 02/02/2024   CLARITYU clear 02/02/2024   GLUCOSEUR negative 02/02/2024   BILIRUBINUR negative 02/02/2024   KETONESU negative 12/28/2023   SPECGRAV 1.015 02/02/2024   RBCUR negative 02/02/2024   PHUR 7.0 02/02/2024   PROTEINUR NEGATIVE 12/28/2023   UROBILINOGEN 0.2 02/02/2024   LEUKOCYTESUR Negative 02/02/2024     Voiding Diary: Deferred  Procedure Timeout:  The correct patient was verified and the correct procedure was verified. The patient was in the correct position and safety precautions were reviewed based on at the patient's history.  Urodynamic Procedure A 90F dual lumen urodynamics catheter was placed under sterile conditions into the patient's bladder. A 90F catheter was placed into the rectum in order to measure abdominal pressure. EMG patches were placed in the appropriate position.  All connections were confirmed and calibrations/adjusted made. Saline was instilled into the bladder through the dual lumen catheters.  Cough/valsalva pressures were measured periodically during filling.  Patient was allowed to void.  The bladder was then emptied of its residual.  UROFLOW: Revealed a Qmax of 8 mL/sec.  She voided 47 mL and had a residual of 475 mL.  It was a intermittent pattern and represented normal habits though interpretation limited due to low voided volume.  CMG: This was performed with sterile water in the sitting position at a fill rate of 30 mL/min.    First sensation of  fullness was 370 mLs,  First urge was 496 mLs,  Strong urge was 660 mLs and  Capacity was 847 mLs  Stress incontinence was not demonstrated Highest negative CLPP was 188 cmH20 at 502 ml. Highest negative VLPP was 160 cmH20at 502 ml.   Detrusor function was underactive, with no phasic contractions seen.    Compliance:  Normal. End fill detrusor pressure was 0cmH20.    UPP: MUCP without barrier reduction was 74 cm of water.    MICTURITION STUDY: Voiding was performed without reduction in the sitting position.  Pdet at Qmax was 32 cm of water.  Qmax was 13 mL/sec.  It was a intermittent pattern.  She voided 727 mL and had a residual of 120 mL.  It was a volitional void, sustained detrusor contraction was present and abdominal straining was mildly present  EMG: This was performed with patches.  She had voluntary contractions, recruitment with fill was not present and urethral sphincter was relaxed with void.  The details of the procedure with the study tracings have been scanned into EPIC.   Urodynamic Impression:  1. Sensation was reduced; capacity was increased 2. Stress Incontinence was not demonstrated at normal pressures; 3. Detrusor Overactivity was not demonstrated. 4. Emptying was dysfunctional with a elevated PVR (475 with uroflow), a sustained detrusor contraction present,  abdominal straining present, normal urethral sphincter activity on EMG.  Plan: - The patient will follow up  to discuss the findings and treatment options.  -Kidney US  ordered today.  -Patient given another bladder diary for in and out cathing documentation and given hats and catheters.

## 2024-02-02 NOTE — Patient Instructions (Addendum)
 Consider adding in Metamucil or Benefiber to help bulk your stool a little more  Please catheterize a few times a day and keep track of the amounts  The Ultrasound of the Kidneys has been ordered.

## 2024-02-03 ENCOUNTER — Ambulatory Visit (HOSPITAL_BASED_OUTPATIENT_CLINIC_OR_DEPARTMENT_OTHER)
Admission: RE | Admit: 2024-02-03 | Discharge: 2024-02-03 | Disposition: A | Discharge status: Home / Self Care | Source: Ambulatory Visit | Attending: Obstetrics and Gynecology | Admitting: Obstetrics and Gynecology

## 2024-02-03 DIAGNOSIS — R339 Retention of urine, unspecified: Secondary | ICD-10-CM | POA: Diagnosis not present

## 2024-02-03 DIAGNOSIS — N319 Neuromuscular dysfunction of bladder, unspecified: Secondary | ICD-10-CM | POA: Insufficient documentation

## 2024-02-04 ENCOUNTER — Telehealth: Payer: Self-pay

## 2024-02-04 DIAGNOSIS — A498 Other bacterial infections of unspecified site: Secondary | ICD-10-CM

## 2024-02-04 NOTE — Telephone Encounter (Signed)
 Stool study order in epic. MyChart message sent to patient with rec's.

## 2024-02-04 NOTE — Telephone Encounter (Signed)
 02/04/2024 Doc of day PM:   MyChart message from Dr. Jarold Merlin patent with a history of + C. Diff on 01/06/24, treated with 10 days of Vancomycin . Florastor twice daily.  Please advise in absence of Dr. Yvone Herd and Pod B APPs   Dr. Yvone Herd,  This is a follow up concern C Diff infection.  I am just starting to have some symptoms again.  There is some liquid like discharge and some mucus in stool again.  I realize it is a Friday but just want to be prepared to give a sample if you think it will be necessary by Monday.  I could pick up the collection containers and be prepared to bring it back next week.  I am also getting some sharp pains in abdominal area, but no bloating at this time.  I really hope it is not starting again but will watch symptoms over the weekend.     Jo Gomez

## 2024-02-04 NOTE — Telephone Encounter (Signed)
 Lets watch over the weekend Can get stool for C diff kit today and do it if still with diarrhea monday Will go with re-Rx, more with symptoms (as C diff can be + for some time. Certainly, neg test would be more helpful) RG

## 2024-02-04 NOTE — Addendum Note (Signed)
 Addended by: Arena Lindahl N on: 02/04/2024 04:28 PM   Modules accepted: Orders

## 2024-02-09 ENCOUNTER — Ambulatory Visit: Payer: Self-pay | Admitting: Family Medicine

## 2024-02-09 NOTE — Progress Notes (Signed)
 Xrays reviewed, MyChart message sent.

## 2024-02-14 ENCOUNTER — Other Ambulatory Visit (HOSPITAL_COMMUNITY)
Admission: RE | Admit: 2024-02-14 | Discharge: 2024-02-14 | Disposition: A | Discharge status: Home / Self Care | Source: Other Acute Inpatient Hospital | Attending: Obstetrics and Gynecology | Admitting: Obstetrics and Gynecology

## 2024-02-14 ENCOUNTER — Encounter: Payer: Self-pay | Admitting: Obstetrics and Gynecology

## 2024-02-14 ENCOUNTER — Other Ambulatory Visit (HOSPITAL_COMMUNITY): Payer: Self-pay

## 2024-02-14 ENCOUNTER — Ambulatory Visit: Admitting: Obstetrics and Gynecology

## 2024-02-14 VITALS — BP 166/92 | HR 80

## 2024-02-14 DIAGNOSIS — N816 Rectocele: Secondary | ICD-10-CM | POA: Insufficient documentation

## 2024-02-14 DIAGNOSIS — R300 Dysuria: Secondary | ICD-10-CM

## 2024-02-14 DIAGNOSIS — R319 Hematuria, unspecified: Secondary | ICD-10-CM

## 2024-02-14 DIAGNOSIS — N3001 Acute cystitis with hematuria: Secondary | ICD-10-CM | POA: Diagnosis not present

## 2024-02-14 DIAGNOSIS — R339 Retention of urine, unspecified: Secondary | ICD-10-CM | POA: Diagnosis not present

## 2024-02-14 DIAGNOSIS — N3000 Acute cystitis without hematuria: Secondary | ICD-10-CM

## 2024-02-14 LAB — URINALYSIS, ROUTINE W REFLEX MICROSCOPIC
Bilirubin Urine: NEGATIVE
Glucose, UA: NEGATIVE mg/dL
Hgb urine dipstick: NEGATIVE
Ketones, ur: NEGATIVE mg/dL
Nitrite: POSITIVE — AB
Protein, ur: NEGATIVE mg/dL
Specific Gravity, Urine: 1.008 (ref 1.005–1.030)
WBC, UA: >50 WBC/hpf (ref 0–5)
pH: 6 (ref 5.0–8.0)

## 2024-02-14 LAB — POCT URINALYSIS DIP (CLINITEK)
Bilirubin, UA: NEGATIVE
Glucose, UA: NEGATIVE mg/dL
Ketones, POC UA: NEGATIVE mg/dL
Nitrite, UA: POSITIVE — AB
POC PROTEIN,UA: NEGATIVE
Spec Grav, UA: 1.01 (ref 1.010–1.025)
Urobilinogen, UA: 0.2 U/dL
pH, UA: 6 (ref 5.0–8.0)

## 2024-02-14 MED ORDER — ESTRADIOL 0.1 MG/GM VA CREA
TOPICAL_CREAM | VAGINAL | 11 refills | Status: AC
  Filled 2024-02-14: qty 42.5, 84d supply, fill #0

## 2024-02-14 MED ORDER — NITROFURANTOIN MONOHYD MACRO 100 MG PO CAPS
100.0000 mg | ORAL_CAPSULE | Freq: Two times a day (BID) | ORAL | 0 refills | Status: AC
  Filled 2024-02-14: qty 10, 5d supply, fill #0

## 2024-02-14 NOTE — Assessment & Plan Note (Signed)
-   Not bothered by the vaginal bulge at this time. But reviewed options of pessary vs surgery.  - Recommended prevention of constipation with miralax daily then titrate as needed.

## 2024-02-14 NOTE — Assessment & Plan Note (Signed)
-   Appears to be intermittent. Has good detrusor function on UDS so likely musculoskeletal related.  - We discussed intermittent self catheterizing as needed and pelvic PT will likely be beneficial.  - If we do not see improvement, we discussed trial of sacral neuromodulation

## 2024-02-14 NOTE — Patient Instructions (Addendum)
 Constipation: Our goal is to achieve formed bowel movements daily or every-other-day.  You may need to try different combinations of the following options to find what works best for you - everybody's body works differently so feel free to adjust the dosages as needed.  Some options to help maintain bowel health include:  Dietary changes (more leafy greens, vegetables and fruits; less processed foods) Fiber supplementation (Benefiber, FiberCon, Metamucil or Psyllium). Start slow and increase gradually to full dose. Over-the-counter agents such as: stool softeners (Docusate or Colace) and/or laxatives (Miralax, milk of magnesia). Recommend miralax once daily then titrate as needed.  Power Pudding is a natural mixture that may help your constipation.  To make blend 1 cup applesauce, 1 cup wheat bran, and 3/4 cup prune juice, refrigerate and then take 1 tablespoon daily with a large glass of water as needed.   Start vaginal estrogen therapy nightly for two weeks then 2 times weekly at night for treatment of vaginal atrophy (dryness of the vaginal tissues) and prevention of UTIs.  Please let us  know if the prescription is too expensive and we can look for alternative options.

## 2024-02-14 NOTE — Progress Notes (Signed)
 Jo Gomez Return Visit  SUBJECTIVE  History of Present Illness: Jo Gomez is a 78 y.o. female seen in follow-up for incomplete bladder emptying. She underwent urodynamic testing.  Last visit she was prescribed cyclobenzaprine  but did not take it for long. She started methocarbamol for back pain/ sciatica- taking twice a day.   Started having burning with urination about a week ago. Still stings some now.   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 She does however struggle with constipation and will sometimes not have a BM for several days.   Urodynamic Impression:  1. Sensation was reduced; capacity was increased 2. Stress Incontinence was not demonstrated at normal pressures; 3. Detrusor Overactivity was not demonstrated. 4. Emptying was dysfunctional with a elevated PVR (475 with uroflow, with micturition study), a sustained detrusor contraction present,  abdominal straining present, normal urethral sphincter activity on EMG.  Renal US  performed on 02/03/24 which was negative for hydronephrosis.   She did cath PVR for a few days mainly in the morning:    Past Medical History: Patient  has a past medical history of Allergy , Anxiety, Arthritis, Asthma, Depression, GERD (gastroesophageal reflux disease), History of endoscopy, HSV infection, Hydrocephalus, unspecified type (HCC), IgA deficiency (HCC), Low vitamin B12 level, Osteoporosis, Ovarian cyst, Plaque psoriasis, Shingles, and Thyroid  dysfunction.   Past Surgical History: She  has a past surgical history that includes broken arm (Right); Total abdominal hysterectomy w/ bilateral salpingoophorectomy (1999); Thyroidectomy (2000); Brow lift (Bilateral, 08/2013); Lumbar disc surgery (10/2020); Colonoscopy; Upper gastrointestinal endoscopy; and Brain surgery (Right, 09/09/2023).   Medications: She has a current medication list which includes the following prescription(s): alendronate ,  cyanocobalamin , cyclobenzaprine , est estrogens -methyltest hs, estradiol, fluticasone , levothyroxine , meloxicam , nitrofurantoin  (macrocrystal-monohydrate), saccharomyces boulardii, ulticare tuberculin safety syr, vitamin d  (ergocalciferol ), and trazodone .   Allergies: Patient is allergic to gammagard s-d less iga [immune globulin (human)], clarithromycin, erythromycin base, levaquin [levofloxacin in d5w], other, sulfonamide derivatives, and tetracycline.   Social History: Patient  reports that she quit smoking about 55 years ago. Her smoking use included cigarettes. She started smoking about 58 years ago. She has a 1.5 pack-year smoking history. She has never used smokeless tobacco. She reports current alcohol use of about 2.0 standard drinks of alcohol per week. She reports that she does not use drugs.     OBJECTIVE     Physical Exam: Vitals:   02/14/24 1529 02/14/24 1605  BP: (!) 175/91 (!) 166/92  Pulse: 84 80   Gen: No apparent distress, A&O x 3.  Detailed Urogynecologic Evaluation:  Deferred.    Results for orders placed or performed in visit on 02/14/24  POCT URINALYSIS DIP (CLINITEK)   Collection Time: 02/14/24  3:51 PM  Result Value Ref Range   Color, UA yellow yellow   Clarity, UA cloudy (A) clear   Glucose, UA negative negative mg/dL   Bilirubin, UA negative negative   Ketones, POC UA negative negative mg/dL   Spec Grav, UA 8.989 8.989 - 1.025   Blood, UA trace-intact (A) negative   pH, UA 6.0 5.0 - 8.0   POC PROTEIN,UA negative negative, trace   Urobilinogen, UA 0.2 0.2 or 1.0 E.U./dL   Nitrite, UA Positive (A) Negative   Leukocytes, UA Small (1+) (A) Negative    ASSESSMENT AND PLAN    Jo Gomez is a 78 y.o. with:  1. Incomplete bladder emptying   2. Acute cystitis without hematuria   3. Dysuria   4. Hematuria, unspecified type  5. Prolapse of posterior vaginal wall    Incomplete bladder emptying Assessment & Plan: - Appears to be intermittent. Has  good detrusor function on UDS so likely musculoskeletal related.  - We discussed intermittent self catheterizing as needed and pelvic PT will likely be beneficial.  - If we do not see improvement, we discussed trial of sacral neuromodulation   Acute cystitis without hematuria -     Nitrofurantoin  Monohyd Macro; Take 1 capsule (100 mg total) by mouth 2 (two) times daily for 5 days.  Dispense: 10 capsule; Refill: 0 -     Estradiol; Place 0.5 gram nightly for 2 weeks then use twice weekly as directed  Dispense: 30 g; Refill: 11  Dysuria -     POCT URINALYSIS DIP (CLINITEK) -     Urine Culture; Future  Hematuria, unspecified type -     Urine Microscopic; Future  Prolapse of posterior vaginal wall Assessment & Plan: - Not bothered by the vaginal bulge at this time. But reviewed options of pessary vs surgery.  - Recommended prevention of constipation with miralax daily then titrate as needed.    Return 3 months  Jo LOISE Caper, MD

## 2024-02-15 ENCOUNTER — Other Ambulatory Visit (HOSPITAL_COMMUNITY): Payer: Self-pay

## 2024-02-16 ENCOUNTER — Ambulatory Visit: Payer: Self-pay | Admitting: Obstetrics and Gynecology

## 2024-02-16 LAB — URINE CULTURE: Culture: >=100000 — AB

## 2024-02-16 NOTE — Progress Notes (Signed)
 Patient has been notified

## 2024-02-17 ENCOUNTER — Ambulatory Visit (INDEPENDENT_AMBULATORY_CARE_PROVIDER_SITE_OTHER): Payer: Medicare PPO

## 2024-03-01 ENCOUNTER — Other Ambulatory Visit (HOSPITAL_BASED_OUTPATIENT_CLINIC_OR_DEPARTMENT_OTHER): Payer: Self-pay

## 2024-03-01 ENCOUNTER — Other Ambulatory Visit: Payer: Self-pay | Admitting: Internal Medicine

## 2024-03-01 ENCOUNTER — Other Ambulatory Visit (HOSPITAL_COMMUNITY): Payer: Self-pay

## 2024-03-01 MED ORDER — LEVOTHYROXINE SODIUM 75 MCG PO TABS
ORAL_TABLET | ORAL | 3 refills | Status: DC
  Filled 2024-03-01: qty 90, 90d supply, fill #0

## 2024-03-01 NOTE — Progress Notes (Signed)
 [facility] Return Visit   Referring Provider Geofm Glade PARAS, MD [address] [place],  [place] [address]  Primary Care Provider Geofm Glade PARAS, MD  Patient Profile: Jo Gomez is a 78 y.o. female with a past medical history noteworthy for carotid artery disease, chronic bronchitis, asthma, hypothyroidism, normal pressure hydrocephalus, colloid cyst of the third ventricle, lumbar radiculopathy, osteopenia, psoriasis, IgA deficiency, vitamin B12 deficiency who returns to the [facility] for follow-up of the problem(s) noted below.  Problem List: Constipation/change in bowel habits Bilateral lower quadrant abdominal pain C. difficile infection 12/2023 GERD Nausea Family history of colon polyps in multiple first-degree relatives Left colon diverticulosis Lactose intolerance   History of Present Illness   [name] was last seen in the GI office 01/10/2024   Current GI Meds  None  Interval History  -- At the time of [name]'s last clinic visit, she reported symptoms of bilateral lower quadrant abdominal pain, constipation and change in bowel habits -- Laboratory studies noteworthy for leukocytosis WBC 21,900 -- CTAP showed inflammatory changes of the cecum -- Stool studies C. difficile PCR positive --> treated with 10 days of vancomycin  -- Symptoms attributable to C. difficile largely resolved -had experienced some gelatinous and loose stool.  -- States that she is continuing to experience urinary retention and has been on repeated courses of antibiotics for UTI -- Reports being cautious about antibiotic use in the setting of previous C. Difficile  -- From a GI standpoint, she is continuing to experience symptoms of constipation -- Will sometimes go up to 5 days without having a bowel movement-states this has been chronic and going on for a long time -- Sometimes her stools are normal in consistency and at other times are hard  balls -- Describes a form of prolapse when she attempts to defecate and needs to physically push tissue in her perineum to evacuate stools -- Previously reviewed: History of TAH + BSO 30 to 40 years ago, 1 SVD without trauma -- States she was started on a plant-based fiber supplement by her urogynecologist and is also eating yogurt -- Previously experienced abdominal pain in a bandlike fashion over the upper abdomen which has improved with the use of Lactaid products -- Today she is describing some mild discomfort in her left lower quadrant -noted that she has not defecated in several days -- Has not used rectal therapies in the past  -- Last colonoscopy 08/2021 was normal  -- GERD is stable -- No nausea, vomiting, dysphagia, odynophagi  -- Weight stable at 118 pounds  Last colonoscopy: 08/2021 - normal, left sided diverticulosis - consider repeat 2028 Last endoscopy: 05/2020 - small HH, multiple gastric polyps (hyperplastic)  Last Abd CT/CTE/MRE:   GI Review of Symptoms Significant for abdominal pain, constipation. Otherwise negative.  General Review of Systems  Review of systems is significant for the pertinent positives and negatives as listed per the HPI.  Full ROS is otherwise negative.  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 Unice   THYROIDECTOMY  2000   TOTAL ABDOMINAL HYSTERECTOMY W/ BILATERAL SALPINGOOPHORECTOMY  1999   UPPER GASTROINTESTINAL ENDOSCOPY       Allergies and Medications   Allergies  Allergen Reactions   Gammagard S-D Less Iga [Immune Globulin (Human)] Anaphylaxis    Have to have triple washed blood products due to this allergy  pt states   Clarithromycin Other (See Comments)    Severe leg and muscle cramps   Erythromycin Base Nausea And Vomiting   Levaquin [Levofloxacin In D5w] Other (See Comments)    tendonitis   Other Rash and Other (See Comments)    IGA Deficiency - catches a lot of viruses   Sulfonamide Derivatives Rash   Tetracycline Rash   Current Meds  Medication Sig   alendronate  (FOSAMAX ) 70 MG tablet Take 1 tablet (70 mg total) by mouth every 7 (seven) days. Take first thing in am with 6 oz. Water.  Be upright after taking.  Eat nothing for one hour.   cyanocobalamin  (VITAMIN B12) 1000 MCG/ML injection Inject 1 mL (1,000 mcg total) into the skin every 30 (thirty) days.   cyclobenzaprine  (FLEXERIL ) 5 MG tablet Take 1 tablet (5 mg total) by mouth 3 (three) times daily as needed for muscle spasms. (Patient taking differently: Take 5 mg by mouth 3 (three) times daily as needed for muscle spasms. As needed bid)   EST ESTROGENS -METHYLTEST HS 0.625-1.25 MG tablet Take 1 tablet by mouth 3 (three) times a week.   estradiol  (ESTRACE ) 0.1 MG/GM vaginal cream Place 0.5 gram nightly for 2 weeks then use twice weekly as directed   levothyroxine  (SYNTHROID ) 75 MCG tablet TAKE 1 TABLET (75 MCG) BY MOUTH DAILY BEFORE BREAKFAST   linaclotide  (LINZESS ) 72 MCG capsule Take 1 capsule daily 30 minutes before breakfast.   saccharomyces boulardii (FLORASTOR) 250 MG capsule Take 1 capsule (250 mg total) by mouth 2 (two) times daily.   TUBERCULIN SYR 1CC/25GX5/8 (ULTICARE TUBERCULIN SAFETY SYR) 25G X 5/8 1 ML MISC Use as directed for monthly B12 injections    Vitamin D , Ergocalciferol , (DRISDOL ) 1.25 MG (50000 UNIT) CAPS capsule TAKE 1 CAPSULE BY MOUTH EVERY OTHER WEEK     Family History   Family History  Problem Relation Age of Onset   Heart attack Mother    Osteoporosis Mother    Lung cancer Mother    Colon polyps Mother    Arthritis Mother    COPD Father        chronic lung infections   Colon polyps Father    Colon cancer Neg Hx    Esophageal cancer Neg Hx    Stomach cancer Neg Hx    Rectal cancer Neg Hx    Breast cancer Neg Hx     Social History   Social History   Tobacco Use   Smoking status: Former    Current packs/day: 0.00    Average packs/day: 0.5 packs/day for 3.0 years (1.5 ttl pk-yrs)    Types: Cigarettes    Start date: 08/24/1965    Quit date: 08/24/1968  Years since quitting: 55.5   Smokeless tobacco: Never   Tobacco comments:    in college  Vaping Use   Vaping status: Never Used  Substance Use Topics   Alcohol use: Yes    Alcohol/week: 2.0 standard drinks of alcohol    Types: 2 Standard drinks or equivalent per week    Comment: 2 glasses a week   Drug use: No   Jennalee reports that she quit smoking about 55 years ago. Her smoking use included cigarettes. She started smoking about 58 years ago. She has a 1.5 pack-year smoking history. She has never used smokeless tobacco. She reports current alcohol use of about 2.0 standard drinks of alcohol per week. She reports that she does not use drugs.  Vital Signs and Physical Examination   Vitals:   03/03/24 0940  BP: 122/66  Pulse: 70    Body mass index is 23.05 kg/m. Weight: 118 lb (53.5 kg)  General: Well developed, well nourished, no acute distress Head: Normocephalic and atraumatic Eyes: Sclerae anicteric, EOMI Lungs: Clear throughout to auscultation Heart: Regular rate and rhythm; No murmurs, rubs or bruits Abdomen: Soft, tender to palpation in LLQ and non distended. No masses, hepatosplenomegaly or hernias noted. Normal Bowel sounds Rectal:  Deferred Musculoskeletal: Symmetrical with no gross deformities     Review of Data  The following data was reviewed at the time of this encounter:  Laboratory Studies      Latest Ref Rng & Units 01/06/2024   11:29 AM 04/15/2023   10:49 AM 02/18/2022    8:38 AM  CBC  WBC 4.0 - 10.5 K/uL 21.9  6.6  6.5   Hemoglobin 12.0 - 15.0 g/dL 86.2  86.4  86.4   Hematocrit 36.0 - 46.0 % 39.8  39.8  39.1   Platelets 150.0 - 400.0 K/uL 390.0  234.0  253.0     Lab Results  Component Value Date   LIPASE 44.0 06/14/2020      Latest Ref Rng & Units 01/06/2024   11:29 AM 09/27/2023    2:22 PM 04/15/2023   10:49 AM  CMP  Glucose 70 - 99 mg/dL 893  887  89   BUN 6 - 23 mg/dL 17  15  15    Creatinine 0.40 - 1.20 mg/dL 9.25  9.18  9.15   Sodium 135 - 145 mEq/L 129  130  132   Potassium 3.5 - 5.1 mEq/L 4.3  4.2  3.9   Chloride 96 - 112 mEq/L 96  96  97   CO2 19 - 32 mEq/L 25  26  28    Calcium 8.4 - 10.5 mg/dL 8.9  8.8  8.8   Total Protein 6.0 - 8.3 g/dL 6.6   6.9   Total Bilirubin 0.2 - 1.2 mg/dL 0.3   0.6   Alkaline Phos 39 - 117 U/L 65   70   AST 0 - 37 U/L 12   33   ALT 0 - 35 U/L 21   37    Lab Results  Component Value Date   TSH 0.88 04/15/2023   T4TOTAL 9.3 01/06/2024     Imaging Studies  CTAP 01/04/2024 Inflammatory changes of the cecum concerning for colitis versus typhlitis. Bladder wall thickening may be from under distention or cystitis. Indwelling VP shunt catheter with trace free fluid in the pelvis.  HIDA 07/2020 Normal  RUQ US  06/2020 No sonographic finding to explain the patient's abnormal liver enzymes.   GI Procedures and Studies  Colonoscopy 09/03/2021  Normal, left sided diverticulosis  EGD 06/17/2020 Small HH, multiple gastric polyps  Colonoscopy 07/15/2016 Normal  EGD 03/07/2010 Normal   Clinical Impression  It is my clinical impression that Ms. Goodnough is a 78 y.o. female with;  Constipation/change in bowel habits Bilateral lower quadrant  abdominal pain C. difficile infection 12/2023 GERD Nausea Family history of colon polyps in multiple first-degree relatives Left colon diverticulosis Lactose intolerance  Brighten previously presented to the office with recent onset symptoms of constipation/change in bowel habits and bilateral lower quadrant abdominal pain.  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  Prior to shunt placement she was experiencing both urinary and fecal incontinence.  Now she notes symptoms of urinary retention and difficulty evacuating bowel movements.  Describes a form of perineal prolapse for which she needs to manually apply pressure in the perineum to defecate.  She has had a hysterectomy and bilateral salpingo-oophorectomy.  Last colonoscopy in 2023 was unremarkable.  We discussed that the differential diagnosis for her symptoms could include constipation related to her neurological issues resulting in a form of neurogenic bowel and bladder, pelvic floor dysfunction, rectocele.  At today's visit we discussed conservative measures with fiber supplementation, trial of Linzess  and pelvic floor physical therapy.  Her urogynecologist discussed with her forms of what sound to be sacral nerve stimulation for urinary retention which may be options if symptoms are refractory in the future.  Mahnoor was diagnosed with C. difficile after her clinic visit in May 2025.  She had abdominal discomfort in the bilateral lower quadrants and some gelatinous stools.  CT scan showed inflammation of the cecum associated with leukocytosis and positive C. difficile PCR.  Symptoms improved with course of vancomycin .  She is continuing on probiotics.  She has frequent UTIs and is cautious about antibiotic use.  No other abnormalities were seen on CT to account for her symptoms of constipation.  Plan  Trial of Linzess  72 mcg p.o. daily-can  titrate dose depending upon effect Discussed use of suppositories for rectal stimulation Handout provided regarding fiber supplementation and diet Proceed with pelvic floor physical therapy which has been ordered by urogynecology. Continue daily probiotic given history of C. difficile and cautious use of antibiotics Lifestyle modification for GERD management Colorectal cancer screening-normal colonoscopy 2023 -consider repeat 2028 per Dr. Albin notes  Planned Follow Up 3-4 months  The patient or caregiver verbalized understanding of the material covered, with no barriers to understanding. All questions were answered. Patient or caregiver is agreeable with the plan outlined above.    It was a pleasure to see Birdie.  If you have any questions or concerns regarding this evaluation, do not hesitate to contact me.  Inocente Hausen, MD [facility]

## 2024-03-02 ENCOUNTER — Ambulatory Visit: Attending: Obstetrics and Gynecology

## 2024-03-02 ENCOUNTER — Other Ambulatory Visit: Payer: Self-pay

## 2024-03-02 DIAGNOSIS — M6281 Muscle weakness (generalized): Secondary | ICD-10-CM | POA: Insufficient documentation

## 2024-03-02 DIAGNOSIS — R293 Abnormal posture: Secondary | ICD-10-CM | POA: Insufficient documentation

## 2024-03-02 DIAGNOSIS — M5459 Other low back pain: Secondary | ICD-10-CM | POA: Insufficient documentation

## 2024-03-02 DIAGNOSIS — M62838 Other muscle spasm: Secondary | ICD-10-CM | POA: Insufficient documentation

## 2024-03-02 DIAGNOSIS — R279 Unspecified lack of coordination: Secondary | ICD-10-CM | POA: Insufficient documentation

## 2024-03-02 NOTE — Patient Instructions (Signed)
Squatty potty: When your knees are level or below the level of your hips, pelvic floor muscles are pressed against rectum, preventing ease of bowel movement. By getting knees above the level of the hips, these pelvic floor muscles relax, allowing easier passage of bowel movement. Ways to get knees above hips: o Squatty Potty (7inch and 9inch versions) o Small stool o Roll of toilet paper under each foot o Hardback book or stack of magazines under each foot  Relaxed Toileting mechanics: Once in this position, make sure to lean forward with forearms on thighs, wide knees, relaxed stomach, and breathe.   Double-voiding:  This technique is to help with post-void dribbling, or leaking a little bit when you stand up right after urinating.  Use relaxed toileting mechanics to urinate as much as you feel like you have to without straining.  Sit back upright from leaning forward and relax this way for 10-20 seconds.  Lean forward again to finish voiding any amount more.    [facility] [address], [address] [place], [place] [address] Phone # [phone] Fax [phone]  

## 2024-03-02 NOTE — Therapy (Signed)
 OUTPATIENT PHYSICAL THERAPY FEMALE PELVIC EVALUATION   Patient Name: Jo Gomez MRN: [id number] DOB:September 10, 1945, 78 y.o., female Today's Date: 03/02/2024  END OF SESSION:  PT End of Session - 03/02/24 1018     Visit Number 1    Date for PT Re-Evaluation 05/25/24    Authorization Type [employer]    Authorization Time Period waiting    PT Start Time 1017    PT Stop Time 1055    PT Time Calculation (min) 38 min    Activity Tolerance Patient tolerated treatment well    Behavior During Therapy WFL for tasks assessed/performed          Past Medical History:  Diagnosis Date   Allergy     Anxiety    Arthritis    arthritic cysts in back/shoulder   Asthma    Depression    GERD (gastroesophageal reflux disease)    diet controlled, no med   History of endoscopy    HSV infection    Hydrocephalus, unspecified type (HCC)    IgA deficiency (HCC)    Low vitamin B12 level    Osteoporosis    Ovarian cyst    TAH/BSO   Plaque psoriasis    Shingles    Thyroid  dysfunction    Past Surgical History:  Procedure Laterality Date   BRAIN SURGERY Right 09/09/2023   broken arm Right    surgery for this- metal plates   BROW LIFT Bilateral 08/2013   COLONOSCOPY     LUMBAR DISC SURGERY  10/2020   L-4, L-5, S-1, Dr. Unice   THYROIDECTOMY  2000   TOTAL ABDOMINAL HYSTERECTOMY W/ BILATERAL SALPINGOOPHORECTOMY  1999   UPPER GASTROINTESTINAL ENDOSCOPY     Patient Active Problem List   Diagnosis Date Noted   Incomplete bladder emptying 02/14/2024   Prolapse of posterior vaginal wall 02/14/2024   Bladder pain 12/01/2023   Urinary incontinence 12/01/2023   Sensory hearing loss, bilateral 11/03/2023   COVID 11/01/2023   Hyponatremia 09/20/2023   Chronic left mastoiditis 08/30/2023   Acute suppurative otitis media of left ear with spontaneous rupture of tympanic membrane 08/30/2023   Otalgia of left ear 08/17/2023   Impacted cerumen of left ear 08/13/2023   Chronic swimmer's ear of left  side 08/13/2023   Non-recurrent acute serous otitis media of left ear 08/13/2023   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 08/2023 06/17/2023   Anxiety 03/25/2023   Carotid artery disease (HCC) 02/23/2023   Lightheadedness 02/05/2023   Poor balance 02/05/2023   Frequent falls 02/05/2023   Palpitations 02/05/2023   Acute sinus infection 03/26/2022   Arthropathy of lumbar facet joint 02/16/2022   Mild persistent asthma without complication 07/07/2021   Bilateral hand pain 05/14/2021   Myalgia 04/17/2021   Polyarthralgia 04/17/2021   Heartburn 03/31/2021   Psoriasis 02/12/2021   Bilateral lumbar radiculopathy 01/15/2021   Elevated LFTs 09/10/2020   Chronic bronchitis (HCC) 02/01/2019   Cardiac murmur 09/29/2016   Pain of both shoulder joints 06/24/2016   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   Insomnia 09/16/2015   Low back pain radiating to left leg 06/25/2014   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   Vitamin B12 deficiency 03/05/2010   IgA deficiency (HCC) 10/18/2009   Hypothyroidism 09/05/2007   Depression 10/21/2006   Osteopenia 10/21/2006    PCP: Geofm Glade PARAS, MD  REFERRING PROVIDER: Zuleta, Kaitlin G, NP   REFERRING DIAG: R33.9 (ICD-10-CM) - Urinary retention  THERAPY DIAG:  Other muscle spasm  Unspecified lack of coordination  Muscle weakness (generalized)  Abnormal posture  Other low back pain  Rationale for Evaluation and Treatment: Rehabilitation  ONSET DATE: 6 months  SUBJECTIVE:                                                                                                                                                                                           SUBJECTIVE STATEMENT: Pt states that she feels like she is not able to empty her bladder. She feels like this came on slowly over time. She did have to self-cath when she had UTI, but does not do regularly now.    PAIN:  Are you  having pain? Yes NPRS scale: 10/10 Pain location: low back pain (surgery L5-S1) sciatica on Lt  Pain type: radiating Pain description: intermittent   Aggravating factors: getting up in the morning Relieving factors: yoga  PRECAUTIONS: None  RED FLAGS: None   WEIGHT BEARING RESTRICTIONS: No  FALLS:  Has patient fallen in last 6 months? No  OCCUPATION: retired Runner, broadcasting/film/video  ACTIVITY LEVEL : gardens and does some yoga stretches  PLOF: Independent  PATIENT GOALS: empty bladder completely  PERTINENT HISTORY:  Rt brain surgery 2025, lumbar disc surgery 2022, thyroidectomy 2000, total abdominal hysterectomy 1999, asthma, anxiety, depression, GERD, HSV infection, osteoporosis,    BOWEL MOVEMENT: Pain with bowel movement: Yes gas pain Type of bowel movement:Type (Bristol Stool Scale) 4 when she is doing well, sometimes 5, Frequency states that she has been constipated her entire life, Strain yes, and Splinting yes Fully empty rectum: Yes: sometimes Leakage: Yes: if she gets urges to go and tries to hold it, especially since she has been taken miralax, associated with pain when she holds it in Pads: No Fiber supplement/laxative Yes - supplement to add moisture to her stool - thinks maybe Miralax  URINATION: Pain with urination: No Fully empty bladder: No Stream: hard to start, starts and stops  Urgency: Yes  Frequency: every 2-3 hours Fluid Intake: coffee in the morning, only water after that - 4-5 large glasses of water a day  Leakage: Sneezing and occasionally trying to hold Pads: No  INTERCOURSE:  Ability to have vaginal penetration Yes  Pain with intercourse: none DrynessNo Climax: WNL Marinoff Scale: 0/3 Lubricant: yes  PREGNANCY: Vaginal deliveries 1 Tearing Yes: sutures Episiotomy No C-section deliveries 0 Currently pregnant No  PROLAPSE: None   OBJECTIVE:  Note: Objective measures were completed at Evaluation unless otherwise  noted.  03/02/24:  DIAGNOSTIC TEST: Urodynamic Impression:  1. Sensation was reduced; capacity was increased 2. Stress Incontinence was not demonstrated at normal pressures;  3. Detrusor Overactivity was not demonstrated. 4. Emptying was dysfunctional with a elevated PVR (475 with uroflow, with micturition study), a sustained detrusor contraction present,  abdominal straining present, normal urethral sphincter activity on EMG.  PATIENT SURVEYS:   PFIQ-7: 59  COGNITION: Overall cognitive status: Within functional limits for tasks assessed     SENSATION: Light touch: Appears intact   FUNCTIONAL TESTS:  Single leg stance:  Rt: pelvic drop, unable  Lt: pelvic drop, unable Curl-up test: abdominal distortion throughout midline, mild   GAIT: Assistive device utilized: None Comments: WNL  POSTURE: rounded shoulders, forward head, decreased lumbar lordosis, increased thoracic kyphosis, and posterior pelvic tilt   LUMBARAROM/PROM:  A/PROM A/PROM  Eval (% available)  Flexion 90  Extension 50  Right lateral flexion 50  Left lateral flexion 50  Right rotation 50  Left rotation 50   (Blank rows = not tested)  PALPATION:   General: tightness throughout bil glutes and lumbar paraspinals  Pelvic Alignment: posterior pelvic tilt  Abdominal: tenderness with palpation of bladder and Lt lower quadrant                External Perineal Exam: paleness                             Internal Pelvic Floor: tenderness throughout reported as achiness; sharpness with mobilization of bladder; bladder feels firm  Patient confirms identification and approves PT to assess internal pelvic floor and treatment Yes  PELVIC MMT:   MMT eval  Vaginal 2, 7 second hold, 5 repeat contractions  Diastasis Recti No diastasis, some abdominal distortion throughout midline   (Blank rows = not tested)        TONE: Some mild increase  PROLAPSE: Grade 2 posterior vaginal wall laxity  TODAY'S  TREATMENT:                                                                                                                              DATE:  03/02/24 EVAL  Neuromuscular re-education: Pt provides verbal consent for internal vaginal/rectal pelvic floor exam. Internal vaginal pelvic floor muscle contraction training Quick flicks Long holds Emphasis on relaxation and improving proprioception, fatiguing muscles  Diaphragmatic breathing for pelvic floor muscle relaxation Exercises: Lower trunk rotation 10x Single knee to chest 5x bil Open books 5x bil Therapeutic activities: Squatty potty Double voiding     PATIENT EDUCATION:  Education details: See above Person educated: Patient Education method: Programmer, multimedia, Facilities manager, Actor cues, Verbal cues, and Handouts Education comprehension: verbalized understanding  HOME EXERCISE PROGRAM: [id number]  ASSESSMENT:  CLINICAL IMPRESSION: Patient is a 78 y.o. female who was seen today for physical therapy evaluation and treatment for urinary retention, constipation, and urinary/fecal incontinence. Exam findings notable for decreased lumbar A/ROM, abnormal posture, restriction throughout bil lumbar paraspinals and glutes, abdominal doming throughout midline, tenderness with palpation of bladder and feels firm, tenderness in Lt lower  quadrant, paleness and atrophy in vulva, mild increase in tone and atrophy in pelvic floor, pelvic floor muscle weakness, decreased pelvic floor muscle endurance, tenderness with palpation of bladder internally and restriction with mobilization, and grade 2 posterior vaginal wall laxity. Signs and symptoms are most consistent with bladder spasm, mild increase in pelvic floor muscle tone, posterior vaginal wall laxity, and pelvic floor muscle weakness/poor coordination; believe that increasing mobility in pelvic floor will help reduce bladder restriction and tone while increasing circulation as well, helping to improve  bladder emptying. She will also benefit from strengthening of core and pelvic floor muscles to optimize length tension relationships. Initial treatment consisted of pelvic floor muscle strengthening, double voiding, diaphragmatic breathing for improved relaxation, squatty potty, and stretches. She will continue to benefit from skilled PT intervention in order to improve bladder emptying, decrease urinary/fecal incontinence, improve constipation, and address all impairments.   OBJECTIVE IMPAIRMENTS: decreased activity tolerance, decreased coordination, decreased endurance, decreased mobility, decreased ROM, decreased strength, increased fascial restrictions, increased muscle spasms, impaired flexibility, impaired tone, improper body mechanics, postural dysfunction, and pain.   ACTIVITY LIMITATIONS: urinating, continence  PARTICIPATION LIMITATIONS: community activity  PERSONAL FACTORS: 1 comorbidity: medical history are also affecting patient's functional outcome.   REHAB POTENTIAL: Good  CLINICAL DECISION MAKING: Stable/uncomplicated  EVALUATION COMPLEXITY: Low   GOALS: Goals reviewed with patient? Yes  SHORT TERM GOALS: Target date: 03/30/2024    Pt will be independent with HEP.   Baseline: Goal status: INITIAL  2.  Pt will report 25% improvement in bladder emptying in order to decrease risk for UTIs.  Baseline:  Goal status: INITIAL  3.  Pt will be independent with double voiding in order to improve post-void residual to decrease risk of UTIs.   Baseline:  Goal status: INITIAL  4.  Pt will demonstrate normal pelvic floor muscle tone and decreased bladder sensitivity in order to allow for complete bladder emptying in order to decrease urinary incontinence and unwanted odor.  Baseline:  Goal status: INITIAL  5.  Pt will be independent with use of squatty potty, relaxed toileting mechanics, and improved bowel movement techniques in order to increase ease of bowel movements and  complete evacuation.   Baseline:  Goal status: INITIAL    LONG TERM GOALS: Target date: 05/25/24  Pt will be independent with advanced HEP.   Baseline:  Goal status: INITIAL  2.  Pt will report 75% improvement in bladder emptying in order to decrease risk for UTIs.  Baseline:  Baseline:  Goal status: INITIAL  3.  Pt will report no leaks with sneezing in order to improve comfort with interpersonal relationships and community activities.   Baseline:  Goal status: INITIAL  4.  Pt will report no episodes of fecal incontinence in order to decrease risk of skin damage and infection.  Baseline:  Goal status: INITIAL  5.  Pt will be able to go 2-3 hours in between voids without urgency or incontinence in order to improve QOL and perform all functional activities with less difficulty.   Baseline:  Goal status: INITIAL   PLAN:  PT FREQUENCY: 1-2x/week  PT DURATION: 12 weeks   PLANNED INTERVENTIONS: 97110-Therapeutic exercises, 97530- Therapeutic activity, 97112- Neuromuscular re-education, 97535- Self Care, [id number]- Manual therapy, Dry Needling, and Biofeedback  PLAN FOR NEXT SESSION: manual techniques to reduce pelvic floor muscle tensions nad bladder restriction, down training activities  Josette Mares, PT, DPT07/10/252:07 PM

## 2024-03-03 ENCOUNTER — Ambulatory Visit: Admitting: Pediatrics

## 2024-03-03 ENCOUNTER — Encounter: Payer: Self-pay | Admitting: Pediatrics

## 2024-03-03 VITALS — BP 122/66 | HR 70 | Ht 60.0 in | Wt 118.0 lb

## 2024-03-03 DIAGNOSIS — K219 Gastro-esophageal reflux disease without esophagitis: Secondary | ICD-10-CM

## 2024-03-03 DIAGNOSIS — Z8619 Personal history of other infectious and parasitic diseases: Secondary | ICD-10-CM | POA: Diagnosis not present

## 2024-03-03 DIAGNOSIS — Z83719 Family history of colon polyps, unspecified: Secondary | ICD-10-CM

## 2024-03-03 DIAGNOSIS — R109 Unspecified abdominal pain: Secondary | ICD-10-CM

## 2024-03-03 DIAGNOSIS — K5730 Diverticulosis of large intestine without perforation or abscess without bleeding: Secondary | ICD-10-CM | POA: Diagnosis not present

## 2024-03-03 DIAGNOSIS — R110 Nausea: Secondary | ICD-10-CM

## 2024-03-03 DIAGNOSIS — E739 Lactose intolerance, unspecified: Secondary | ICD-10-CM

## 2024-03-03 DIAGNOSIS — R1030 Lower abdominal pain, unspecified: Secondary | ICD-10-CM

## 2024-03-03 DIAGNOSIS — K5900 Constipation, unspecified: Secondary | ICD-10-CM

## 2024-03-03 DIAGNOSIS — A498 Other bacterial infections of unspecified site: Secondary | ICD-10-CM

## 2024-03-03 MED ORDER — LINACLOTIDE 72 MCG PO CAPS: ORAL_CAPSULE | ORAL | 0 refills | Status: DC

## 2024-03-03 NOTE — Patient Instructions (Signed)
 We have given you samples of the following medication to take: Linzess  72 mcg once a day for 1 week.   Follow up for 3-4 months.  Thank you for entrusting me with your care and for choosing [facility], Dr. Inocente Hausen  _______________________________________________________  If your blood pressure at your visit was 140/90 or greater, please contact your primary care physician to follow up on this.  _______________________________________________________  If you are age 76 or older, your body mass index should be between 23-30. Your Body mass index is 23.05 kg/m. If this is out of the aforementioned range listed, please consider follow up with your Primary Care Provider.  If you are age 23 or younger, your body mass index should be between 19-25. Your Body mass index is 23.05 kg/m. If this is out of the aformentioned range listed, please consider follow up with your Primary Care Provider.   ________________________________________________________  The [facility] providers would like to encourage you to use MYCHART to communicate with providers for non-urgent requests or questions.  Due to long hold times on the telephone, sending your provider a message by [facility] may be a faster and more efficient way to get a response.  Please allow 48 business hours for a response.  Please remember that this is for non-urgent requests.  _______________________________________________________

## 2024-03-08 ENCOUNTER — Other Ambulatory Visit (HOSPITAL_COMMUNITY): Payer: Self-pay

## 2024-03-09 ENCOUNTER — Other Ambulatory Visit (HOSPITAL_COMMUNITY): Payer: Self-pay

## 2024-03-09 DIAGNOSIS — L409 Psoriasis, unspecified: Secondary | ICD-10-CM | POA: Diagnosis not present

## 2024-03-09 MED ORDER — CLOBETASOL PROPIONATE 0.05 % EX CREA
1.0000 | TOPICAL_CREAM | Freq: Two times a day (BID) | CUTANEOUS | 1 refills | Status: AC
  Filled 2024-03-09: qty 60, 30d supply, fill #0

## 2024-03-12 ENCOUNTER — Encounter: Payer: Self-pay | Admitting: Pediatrics

## 2024-03-12 ENCOUNTER — Encounter (HOSPITAL_BASED_OUTPATIENT_CLINIC_OR_DEPARTMENT_OTHER): Payer: Self-pay | Admitting: Obstetrics & Gynecology

## 2024-03-13 ENCOUNTER — Other Ambulatory Visit (HOSPITAL_BASED_OUTPATIENT_CLINIC_OR_DEPARTMENT_OTHER): Payer: Self-pay | Admitting: Obstetrics & Gynecology

## 2024-03-13 ENCOUNTER — Other Ambulatory Visit (HOSPITAL_BASED_OUTPATIENT_CLINIC_OR_DEPARTMENT_OTHER): Payer: Self-pay

## 2024-03-13 ENCOUNTER — Other Ambulatory Visit (HOSPITAL_COMMUNITY): Payer: Self-pay

## 2024-03-13 DIAGNOSIS — Z9229 Personal history of other drug therapy: Secondary | ICD-10-CM

## 2024-03-13 MED ORDER — EST ESTROGENS-METHYLTEST HS 0.625-1.25 MG PO TABS: 1.0000 | ORAL_TABLET | ORAL | 1 refills | Status: DC

## 2024-03-13 MED ORDER — EST ESTROGENS-METHYLTEST HS 0.625-1.25 MG PO TABS
1.0000 | ORAL_TABLET | ORAL | 1 refills | Status: DC
  Filled 2024-03-13: qty 36, 84d supply, fill #0

## 2024-03-14 ENCOUNTER — Other Ambulatory Visit (HOSPITAL_COMMUNITY): Payer: Self-pay

## 2024-03-14 MED ORDER — LINACLOTIDE 145 MCG PO CAPS
145.0000 ug | ORAL_CAPSULE | Freq: Every day | ORAL | 1 refills | Status: AC
  Filled 2024-03-14: qty 90, 90d supply, fill #0

## 2024-03-21 ENCOUNTER — Other Ambulatory Visit (HOSPITAL_COMMUNITY): Payer: Self-pay

## 2024-03-21 ENCOUNTER — Other Ambulatory Visit: Payer: Self-pay

## 2024-03-21 MED ORDER — VTAMA 1 % EX CREA
1.0000 | TOPICAL_CREAM | Freq: Every day | CUTANEOUS | 1 refills | Status: AC
  Filled 2024-03-21: qty 60, 30d supply, fill #0

## 2024-03-27 ENCOUNTER — Ambulatory Visit (INDEPENDENT_AMBULATORY_CARE_PROVIDER_SITE_OTHER): Payer: Medicare PPO

## 2024-03-27 ENCOUNTER — Encounter: Payer: Self-pay | Admitting: Internal Medicine

## 2024-03-27 VITALS — Ht 60.0 in | Wt 118.0 lb

## 2024-03-27 DIAGNOSIS — Z0000 Encounter for general adult medical examination without abnormal findings: Secondary | ICD-10-CM | POA: Diagnosis not present

## 2024-03-27 NOTE — Patient Instructions (Signed)
 Jo Gomez , Thank you for taking time out of your busy schedule to complete your Annual Wellness Visit with me. I enjoyed our conversation and look forward to speaking with you again next year. I, as well as your care team,  appreciate your ongoing commitment to your health goals. Please review the following plan we discussed and let me know if I can assist you in the future. Your Game plan/ To Do List     Follow up Visits: We will see or speak with you next year for your Next Medicare AWV with our clinical staff Have you seen your provider in the last 6 months (3 months if uncontrolled diabetes)? Yes  Clinician Recommendations:  Aim for 30 minutes of exercise or brisk walking, 6-8 glasses of water, and 5 servings of fruits and vegetables each day. Keep up the good work.      This is a list of the screenings recommended for you:  Health Maintenance  Topic Date Due   Zoster (Shingles) Vaccine (1 of 2) Never done   COVID-19 Vaccine (7 - 2024-25 season) 04/25/2023   Medicare Annual Wellness Visit  03/23/2024   Flu Shot  03/24/2024   DEXA scan (bone density measurement)  02/02/2025   Colon Cancer Screening  09/03/2026   DTaP/Tdap/Td vaccine (4 - Td or Tdap) 08/05/2030   Pneumococcal Vaccine for age over 35  Completed   Hepatitis B Vaccine  Completed   Hepatitis C Screening  Completed   HPV Vaccine  Aged Out   Meningitis B Vaccine  Aged Out    Advanced directives: (Copy Requested) Please bring a copy of your health care power of attorney and living will to the office to be added to your chart at your convenience. You can mail to [facility] [address] [place], [place] [address] or email to [email or web address] Advance Care Planning is important because it:  [x]  Makes sure you receive the medical care that is consistent with your values, goals, and preferences  [x]  It provides guidance to your family and loved ones and reduces their decisional burden about  whether or not they are making the right decisions based on your wishes.  Follow the link provided in your after visit summary or read over the paperwork we have mailed to you to help you started getting your Advance Directives in place. If you need assistance in completing these, please reach out to us  so that we can help you!  See attachments for Preventive Care and Fall Prevention Tips.

## 2024-03-27 NOTE — Progress Notes (Signed)
 Subjective:   Jo Gomez is a 78 y.o. who presents for a Medicare Wellness preventive visit.  As a reminder, Annual Wellness Visits don't include a physical exam, and some assessments may be limited, especially if this visit is performed virtually. We may recommend an in-person follow-up visit with your provider if needed.  Visit Complete: Virtual I connected with  Rock DELENA Lingo on 03/27/24 by a audio enabled telemedicine application and verified that I am speaking with the correct person using two identifiers.  Patient Location: Home  Provider Location: Home Office  I discussed the limitations of evaluation and management by telemedicine. The patient expressed understanding and agreed to proceed.  Vital Signs: Because this visit was a virtual/telehealth visit, some criteria may be missing or patient reported. Any vitals not documented were not able to be obtained and vitals that have been documented are patient reported.  VideoDeclined- This patient declined Librarian, academic. Therefore the visit was completed with audio only.  Persons Participating in Visit: Patient.  AWV Questionnaire: Yes: Patient Medicare AWV questionnaire was completed by the patient on 03/25/2024; I have confirmed that all information answered by patient is correct and no changes since this date.  Cardiac Risk Factors include: advanced age (>30men, >50 women);Other (see comment), Risk factor comments: CAD     Objective:    Today's Vitals   03/27/24 1356  Weight: 118 lb (53.5 kg)  Height: 5' (1.524 m)   Body mass index is 23.05 kg/m.     03/27/2024    2:16 PM 10/15/2023    2:17 PM 10/15/2023    2:08 PM 06/23/2023    2:10 PM 05/17/2023    9:03 AM 03/24/2023    2:35 PM 03/18/2023    2:49 PM  Advanced Directives  Does Patient Have a Medical Advance Directive? Yes Yes Yes Yes Yes Yes Yes  Type of Estate agent of [place];Living will Healthcare  Power of [place];Living will  Healthcare Power of [place];Living will Healthcare Power of [place];Living will Healthcare Power of [place];Living will Living will;Healthcare Power of Attorney  Does patient want to make changes to medical advance directive?     No - Patient declined    Copy of Healthcare Power of Attorney in Chart? No - copy requested     No - copy requested     Current Medications (verified) Outpatient Encounter Medications as of 03/27/2024  Medication Sig   alendronate  (FOSAMAX ) 70 MG tablet Take 1 tablet (70 mg total) by mouth every 7 (seven) days. Take first thing in am with 6 oz. Water.  Be upright after taking.  Eat nothing for one hour.   clobetasol  cream (TEMOVATE ) 0.05 % Apply 1 Application topically 2 (two) times daily.   cyanocobalamin  (VITAMIN B12) 1000 MCG/ML injection Inject 1 mL (1,000 mcg total) into the skin every 30 (thirty) days.   cyclobenzaprine  (FLEXERIL ) 5 MG tablet Take 1 tablet (5 mg total) by mouth 3 (three) times daily as needed for muscle spasms. (Patient taking differently: Take 5 mg by mouth 3 (three) times daily as needed for muscle spasms. As needed bid)   EST ESTROGENS -METHYLTEST HS 0.625-1.25 MG tablet Take 1 tablet by mouth 3 (three) times a week.   estradiol  (ESTRACE ) 0.1 MG/GM vaginal cream Place 0.5 gram nightly for 2 weeks then use twice weekly as directed   levothyroxine  (SYNTHROID ) 75 MCG tablet TAKE 1 TABLET (75 MCG) BY MOUTH DAILY BEFORE BREAKFAST   linaclotide  (LINZESS ) 145 MCG CAPS capsule  Take 1 capsule (145 mcg total) by mouth daily before breakfast.   Tapinarof  (VTAMA ) 1 % CREA Apply topically daily.   TUBERCULIN SYR 1CC/25GX5/8 (ULTICARE TUBERCULIN SAFETY SYR) 25G X 5/8 1 ML MISC Use as directed for monthly B12 injections   Vitamin D , Ergocalciferol , (DRISDOL ) 1.25 MG (50000 UNIT) CAPS capsule TAKE 1 CAPSULE BY MOUTH EVERY OTHER WEEK   No facility-administered encounter medications on file as of 03/27/2024.    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 Unice   THYROIDECTOMY  2000   TOTAL ABDOMINAL HYSTERECTOMY W/ BILATERAL SALPINGOOPHORECTOMY  1999   UPPER GASTROINTESTINAL ENDOSCOPY     Family History  Problem Relation Age of Onset   Heart attack Mother    Osteoporosis Mother    Lung cancer Mother    Colon polyps Mother    Arthritis Mother    COPD Father        chronic lung infections   Colon polyps Father    Colon cancer Neg Hx    Esophageal cancer Neg Hx    Stomach cancer Neg Hx    Rectal cancer Neg Hx    Breast cancer Neg Hx    Social History   Socioeconomic History   Marital status: Married    Spouse name: Heron   Number of children: 1   Years of education: Not on file   Highest education level: Master's degree (e.g., MA, MS, MEng, MEd, MSW, MBA)  Occupational History    Employer: RETIRED  Tobacco Use   Smoking status: Former    Current packs/day: 0.00    Average packs/day: 0.5 packs/day for 3.0 years (1.5 ttl pk-yrs)    Types: Cigarettes    Start date: 08/24/1965    Quit date: 08/24/1968    Years since quitting: 55.6   Smokeless tobacco: Never   Tobacco  comments:    in college  Vaping Use   Vaping status: Never Used  Substance and Sexual Activity   Alcohol use: Yes    Alcohol/week: 2.0 standard drinks of alcohol    Types: 2 Standard drinks or equivalent per week    Comment: 2 glasses a week   Drug use: No   Sexual activity: Not Currently    Partners: Female    Birth control/protection: Surgical    Comment: TAH/BSO  Other Topics Concern   Not on file  Social History Narrative   Patient is right-handed. She lives with her wife in a split level house. She occasionally drinks coffee.  Are you right handed or left handed?    Are you currently employed ?    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 wife   What type of home do you live in: 1 story or 2 story? one       Social Drivers of Corporate investment banker Strain: Low Risk  (03/25/2024)   Overall Financial Resource Strain (CARDIA)    Difficulty of Paying Living Expenses: Not hard at all  Food Insecurity: No Food Insecurity (03/25/2024)   Hunger Vital Sign    Worried About Running Out of Food in the Last Year: Never true    Ran Out of Food in the Last Year: Never true  Transportation Needs: No Transportation Needs (03/25/2024)   PRAPARE - Administrator, Civil Service (Medical): No    Lack of Transportation (Non-Medical): No  Physical Activity: Insufficiently Active (03/25/2024)   Exercise Vital Sign    Days of Exercise per Week: 1 day    Minutes of Exercise per Session: 40 min  Stress: Stress Concern Present (03/25/2024)   [employer] of Occupational Health - Occupational Stress Questionnaire    Feeling of Stress: To some extent  Social Connections: Moderately Integrated (03/25/2024)   Social Connection and Isolation Panel    Frequency of Communication with Friends and Family: Once a week    Frequency of Social Gatherings with Friends and Family: Three times a week    Attends Religious  Services: Never    Active Member of Clubs or Organizations: Yes    Attends Engineer, structural: More than 4 times per year    Marital Status: Married    Tobacco Counseling Counseling given: Not Answered Tobacco comments: in college    Clinical Intake:  Pre-visit preparation completed: Yes  Pain : No/denies pain     BMI - recorded: 23.05 Nutritional Status: BMI of 19-24  Normal Diabetes: No  Lab Results  Component Value Date   HGBA1C 5.2 03/18/2023   HGBA1C 5.3 02/18/2022   HGBA1C 5.5 02/13/2021     How often do you need to have someone help you when you read instructions, pamphlets, or other written materials from your doctor or pharmacy?: 1 - Never  Interpreter Needed?: No  Information entered by :: Drina Jobst, RMA   Activities of Daily Living     03/27/2024    1:57 PM  In your present state of health, do you have any difficulty performing the following activities:  Hearing? 0  Vision? 0  Difficulty concentrating or making decisions? 0  Walking or climbing stairs? 0  Dressing or bathing? 0  Doing errands, shopping? 0  Preparing Food and eating ? N  Using the Toilet? N  In the past six months, have you accidently leaked urine? N  Do you have problems with loss of bowel control? N  Managing your Medications? N  Managing your Finances? N  Housekeeping or managing your Housekeeping? N    Patient Care Team: Geofm Glade PARAS, MD as PCP - General (Internal Medicine) Lonni Slain, MD as PCP - Cardiology (Cardiology) [facility] Octavia Lonni, MD as Consulting Physician (Ophthalmology)  I have updated your Care Teams any recent Medical Services you may have received from other providers in the past year.     Assessment:   This is a routine wellness examination for [place].  Hearing/Vision screen Hearing Screening -  Comments:: Denies hearing difficulties   Vision Screening - Comments:: Wears eyeglasses/ Dr.  Octavia   Goals Addressed   None    Depression Screen     03/27/2024    2:19 PM 12/01/2023    9:35 AM 09/27/2023    1:17 PM 03/24/2023    2:35 PM 03/18/2023    9:46 AM 02/05/2023    9:28 AM 04/17/2022   11:45 AM  PHQ 2/9 Scores  PHQ - 2 Score 2 1 0 0 0 0 0  PHQ- 9 Score 4 1 0 0       Fall Risk     03/27/2024    2:17 PM 12/01/2023    9:34 AM 10/15/2023    2:08 PM 09/27/2023    1:10 PM 06/23/2023    2:10 PM  Fall Risk   Falls in the past year? 0 0 1 0 0  Number falls in past yr: 0 0 0 0 0  Injury with Fall? 0 0 0 0 0  Risk for fall due to : History of fall(s) No Fall Risks  No Fall Risks   Follow up Falls evaluation completed;Falls prevention discussed Falls evaluation completed Falls evaluation completed Falls evaluation completed Falls evaluation completed    MEDICARE RISK AT HOME:  Medicare Risk at Home Any stairs in or around the home?: No If so, are there any without handrails?: No Home free of loose throw rugs in walkways, pet beds, electrical cords, etc?: Yes Adequate lighting in your home to reduce risk of falls?: Yes Life alert?: No Use of a cane, walker or w/c?: No Grab bars in the bathroom?: Yes Shower chair or bench in shower?: Yes Elevated toilet seat or a handicapped toilet?: Yes  TIMED UP AND GO:  Was the test performed?  No  Cognitive Function: Declined/Normal: No cognitive concerns noted by patient or family. Patient alert, oriented, able to answer questions appropriately and recall recent events. No signs of memory loss or confusion.      04/06/2018   11:56 AM  Montreal Cognitive Assessment   Visuospatial/ Executive (0/5) 4  Naming (0/3) 3  Attention: Read list of digits (0/2) 2  Attention: Read list of letters (0/1) 1  Attention: Serial 7 subtraction starting at 100 (0/3) 1  Language: Repeat phrase (0/2) 2  Language : Fluency (0/1) 1  Abstraction (0/2) 2  Delayed Recall (0/5) 0  Orientation (0/6) 6  Total 22  Adjusted Score (based on education) 22       03/24/2023    2:39 PM 04/17/2022   11:46 AM 02/06/2020    9:57 AM  6CIT Screen  What Year? 0 points 0 points 0 points  What month? 0 points 0 points 0 points  What time? 0 points 0 points 0 points  Count back from 20 0 points 0 points 0 points  Months in reverse 0 points 0 points 0 points  Repeat phrase 0 points 0 points 0 points  Total Score 0 points 0 points 0 points    Immunizations Immunization History  Administered Date(s) Administered   Fluad Quad(high Dose 65+) 07/07/2022   Hepb-cpg 10/10/2021, 12/17/2021   Influenza Split 07/13/2011, 08/08/2012   Influenza Whole 04/21/2010   Influenza,inj,Quad PF,6+ Mos 05/26/2013, 06/25/2014   Influenza-Unspecified 06/25/2015, 05/20/2021, 06/07/2022   Moderna Sars-Covid-2 Vaccination 09/26/2019, 10/27/2019, 04/30/2020   PFIZER Comirnaty(Gray Top)Covid-19 Tri-Sucrose Vaccine 12/08/2020   Pfizer Covid-19 Vaccine Bivalent Booster 8yrs & up 05/12/2021, 07/07/2022   Pneumococcal Conjugate-13 06/25/2014   Pneumococcal Polysaccharide-23 11/23/1978,  08/24/1996, 03/25/2007, 02/01/2019   Td 08/24/2002, 08/05/2020   Tdap 11/30/2013    Screening Tests Health Maintenance  Topic Date Due   Zoster Vaccines- Shingrix (1 of 2) Never done   COVID-19 Vaccine (7 - 2024-25 season) 04/25/2023   Medicare Annual Wellness (AWV)  03/23/2024   INFLUENZA VACCINE  03/24/2024   DEXA SCAN  02/02/2025   Colonoscopy  09/03/2026   DTaP/Tdap/Td (4 - Td or Tdap) 08/05/2030   Pneumococcal Vaccine: 50+ Years  Completed   Hepatitis B Vaccines  Completed   Hepatitis C Screening  Completed   HPV VACCINES  Aged Out   Meningococcal B Vaccine  Aged Out    Health Maintenance  Health Maintenance Due  Topic Date Due   Zoster Vaccines- Shingrix (1 of 2) Never done   COVID-19 Vaccine (7 - 2024-25 season) 04/25/2023   Medicare Annual Wellness (AWV)  03/23/2024   INFLUENZA VACCINE  03/24/2024   Health Maintenance Items Addressed: See Nurse Notes at the end of  this note  Additional Screening:  Vision Screening: Recommended annual ophthalmology exams for early detection of glaucoma and other disorders of the eye. Would you like a referral to an eye doctor? No    Dental Screening: Recommended annual dental exams for proper oral hygiene  Community Resource Referral / Chronic Care Management: CRR required this visit?  No   CCM required this visit?  No   Plan:    I have personally reviewed and noted the following in the patient's chart:   Medical and social history Use of alcohol, tobacco or illicit drugs  Current medications and supplements including opioid prescriptions. Patient is not currently taking opioid prescriptions. Functional ability and status Nutritional status Physical activity Advanced directives List of other physicians Hospitalizations, surgeries, and ER visits in previous 12 months Vitals Screenings to include cognitive, depression, and falls Referrals and appointments  In addition, I have reviewed and discussed with patient certain preventive protocols, quality metrics, and best practice recommendations. A written personalized care plan for preventive services as well as general preventive health recommendations were provided to patient.   Shavonna Corella L Dae Antonucci, CMA   03/27/2024   After Visit Summary: (MyChart) Due to this being a telephonic visit, the after visit summary with patients personalized plan was offered to patient via MyChart   Notes: Patient declines the Shingrix vaccine.  Patient is up to date on all health maintenance with no concerns to address today.

## 2024-03-27 NOTE — Progress Notes (Unsigned)
 Subjective:    Patient ID: Jo Gomez, female    DOB: [birth year], 78 y.o.   MRN: [id number]      HPI Jo Gomez is here for a Physical exam and her chronic medical problems.   Was having issues with bladder retention-did have to self cath for a little while, but that has resolved and she is now urinating normally.  Getting dull frontal headache - has headache across frontal part of head especially if she shakes her head.  Tends to get them more at the end of the day.  She did discuss this with her neurosurgeon and may have a CT scan just to make sure everything looks okay.  She denies any dizziness, lightheadedness, blurry vision or nausea    Medications and allergies reviewed with patient and updated if appropriate.  Current Outpatient Medications on File Prior to Visit  Medication Sig Dispense Refill   alendronate  (FOSAMAX ) 70 MG tablet Take 1 tablet (70 mg total) by mouth every 7 (seven) days. Take first thing in am with 6 oz. Water.  Be upright after taking.  Eat nothing for one hour. 12 tablet 3   clobetasol  cream (TEMOVATE ) 0.05 % Apply 1 Application topically 2 (two) times daily. 60 g 1   cyanocobalamin  (VITAMIN B12) 1000 MCG/ML injection Inject 1 mL (1,000 mcg total) into the skin every 30 (thirty) days. 3 mL 1   cyclobenzaprine  (FLEXERIL ) 5 MG tablet Take 1 tablet (5 mg total) by mouth 3 (three) times daily as needed for muscle spasms. (Patient taking differently: Take 5 mg by mouth 3 (three) times daily as needed for muscle spasms. As needed bid) 90 tablet 1   EST ESTROGENS -METHYLTEST HS 0.625-1.25 MG tablet Take 1 tablet by mouth 3 (three) times a week. 36 tablet 1   estradiol  (ESTRACE ) 0.1 MG/GM vaginal cream Place 0.5 gram nightly for 2 weeks then use twice weekly as directed 30 g 11   levothyroxine  (SYNTHROID ) 75 MCG tablet TAKE 1 TABLET (75 MCG) BY MOUTH DAILY BEFORE BREAKFAST 90 tablet 3   linaclotide  (LINZESS ) 145 MCG CAPS capsule Take 1 capsule (145 mcg total) by  mouth daily before breakfast. 90 capsule 1   Tapinarof  (VTAMA ) 1 % CREA Apply topically daily. 60 g 1   TUBERCULIN SYR 1CC/25GX5/8 (ULTICARE TUBERCULIN SAFETY SYR) 25G X 5/8 1 ML MISC Use as directed for monthly B12 injections 12 each 1   Vitamin D , Ergocalciferol , (DRISDOL ) 1.25 MG (50000 UNIT) CAPS capsule TAKE 1 CAPSULE BY MOUTH EVERY OTHER WEEK 6 capsule 2   No current facility-administered medications on file prior to visit.    Review of Systems  Constitutional:  Negative for fever.  Eyes:  Negative for visual disturbance.  Respiratory:  Negative for cough, shortness of breath and wheezing.   Cardiovascular:  Negative for chest pain, palpitations and leg swelling.  Gastrointestinal:  Positive for constipation. Negative for abdominal pain, blood in stool and diarrhea.       No gerd  Genitourinary:  Negative for dysuria.  Musculoskeletal:  Positive for arthralgias (right knee) and back pain.  Skin:  Positive for rash (psoriasis).  Neurological:  Positive for headaches (frontal). Negative for dizziness and light-headedness.  Psychiatric/Behavioral:  Negative for dysphoric mood. The patient is not nervous/anxious.        Objective:   Vitals:   03/28/24 1445  BP: 122/78  Pulse: 80  Temp: 98 F (36.7 C)  SpO2: 98%   Filed Weights   03/28/24 1445  Weight:  113 lb (51.3 kg)   Body mass index is 22.07 kg/m.  BP Readings from Last 3 Encounters:  03/28/24 122/78  03/03/24 122/66  02/14/24 (!) 166/92    Wt Readings from Last 3 Encounters:  03/28/24 113 lb (51.3 kg)  03/27/24 118 lb (53.5 kg)  03/03/24 118 lb (53.5 kg)       Physical Exam Constitutional: She appears well-developed and well-nourished. No distress.  HENT:  Head: Normocephalic and atraumatic.  Right Ear: External ear normal. Normal ear canal and TM Left Ear: External ear normal.  Normal ear canal and TM Mouth/Throat: Oropharynx is clear and moist.  Eyes: Conjunctivae normal.  Neck: Neck supple. No  tracheal deviation present. No thyromegaly present.  No carotid bruit  Cardiovascular: Normal rate, regular rhythm and normal heart sounds.   No murmur heard.  No edema. Pulmonary/Chest: Effort normal and breath sounds normal. No respiratory distress. She has no wheezes. She has no rales.  Breast: deferred   Abdominal: Soft. She exhibits no distension. There is no tenderness.  Lymphadenopathy: She has no cervical adenopathy.  Skin: Skin is warm and dry. She is not diaphoretic.  Psychiatric: She has a normal mood and affect. Her behavior is normal.     Lab Results  Component Value Date   WBC 21.9 (HH) 01/06/2024   HGB 13.7 01/06/2024   HCT 39.8 01/06/2024   PLT 390.0 01/06/2024   GLUCOSE 106 (H) 01/06/2024   CHOL 204 (H) 04/15/2023   TRIG 71.0 04/15/2023   HDL 59.90 04/15/2023   LDLDIRECT 114.9 08/07/2013   LDLCALC 130 (H) 04/15/2023   ALT 21 01/06/2024   AST 12 01/06/2024   NA 129 (L) 01/06/2024   K 4.3 01/06/2024   CL 96 01/06/2024   CREATININE 0.74 01/06/2024   BUN 17 01/06/2024   CO2 25 01/06/2024   TSH 0.88 04/15/2023   HGBA1C 5.2 03/18/2023         Assessment & Plan:   Physical exam: Screening blood work  ordered Exercise regular Weight normal Substance abuse  none   Reviewed recommended immunizations.   Health Maintenance  Topic Date Due   COVID-19 Vaccine (7 - 2024-25 season) 04/25/2023   INFLUENZA VACCINE  03/24/2024   Zoster Vaccines- Shingrix (1 of 2) 06/28/2024 (Originally 02/24/1965)   DEXA SCAN  02/02/2025   Medicare Annual Wellness (AWV)  03/27/2025   Colonoscopy  09/03/2026   DTaP/Tdap/Td (4 - Td or Tdap) 08/05/2030   Pneumococcal Vaccine: 50+ Years  Completed   Hepatitis B Vaccines  Completed   Hepatitis C Screening  Completed   HPV VACCINES  Aged Out   Meningococcal B Vaccine  Aged Out          See Problem List for Assessment and Plan of chronic medical problems.

## 2024-03-27 NOTE — Patient Instructions (Addendum)

## 2024-03-28 ENCOUNTER — Ambulatory Visit: Admitting: Obstetrics and Gynecology

## 2024-03-28 ENCOUNTER — Ambulatory Visit (INDEPENDENT_AMBULATORY_CARE_PROVIDER_SITE_OTHER): Admitting: Internal Medicine

## 2024-03-28 VITALS — BP 122/78 | HR 80 | Temp 98.0°F | Ht 60.0 in | Wt 113.0 lb

## 2024-03-28 DIAGNOSIS — G8929 Other chronic pain: Secondary | ICD-10-CM

## 2024-03-28 DIAGNOSIS — K5904 Chronic idiopathic constipation: Secondary | ICD-10-CM

## 2024-03-28 DIAGNOSIS — M545 Low back pain, unspecified: Secondary | ICD-10-CM

## 2024-03-28 DIAGNOSIS — Z0000 Encounter for general adult medical examination without abnormal findings: Secondary | ICD-10-CM | POA: Diagnosis not present

## 2024-03-28 DIAGNOSIS — E890 Postprocedural hypothyroidism: Secondary | ICD-10-CM | POA: Diagnosis not present

## 2024-03-28 DIAGNOSIS — G912 (Idiopathic) normal pressure hydrocephalus: Secondary | ICD-10-CM | POA: Diagnosis not present

## 2024-03-28 DIAGNOSIS — R739 Hyperglycemia, unspecified: Secondary | ICD-10-CM

## 2024-03-28 DIAGNOSIS — E538 Deficiency of other specified B group vitamins: Secondary | ICD-10-CM

## 2024-03-28 DIAGNOSIS — E871 Hypo-osmolality and hyponatremia: Secondary | ICD-10-CM | POA: Diagnosis not present

## 2024-03-28 DIAGNOSIS — K5900 Constipation, unspecified: Secondary | ICD-10-CM | POA: Insufficient documentation

## 2024-03-28 DIAGNOSIS — M810 Age-related osteoporosis without current pathological fracture: Secondary | ICD-10-CM | POA: Diagnosis not present

## 2024-03-28 NOTE — Assessment & Plan Note (Signed)
 Chronic S/P surgery with improved radiculopathy, but still has chronic lower back pain Improved with ice, yoga, stretching Worse when she first gets up in the morning Continue above Try heat

## 2024-03-28 NOTE — Assessment & Plan Note (Signed)
Chronic  Clinically euthyroid Check tsh and will titrate med dose if needed Currently taking levothyroxine 75 mcg daily  

## 2024-03-28 NOTE — Assessment & Plan Note (Signed)
 History of NPH s/p VP shunt placement Has been having some increased headaches and has discussed with neurosurgery-will likely go for CT scan just to make sure everything is okay

## 2024-03-28 NOTE — Assessment & Plan Note (Signed)
 Was experiencing hyponatremia that was symptomatic Had baseline mild hyponatremia with SSRI, but this may worsen after surgery-VP shunt placement No longer on SSRI Drinking appropriate fluids, not restricting sodium CMP today

## 2024-03-28 NOTE — Assessment & Plan Note (Signed)
Chronic Check a1c Low sugar / carb diet Stressed regular exercise  

## 2024-03-28 NOTE — Assessment & Plan Note (Addendum)
 Chronic Following with GI Controlled Taking a probiotic, fiber substitute Taking linzess  145 mcg daily Drinks a good amount of water

## 2024-03-28 NOTE — Assessment & Plan Note (Signed)
Chronic Taking B12 supplementation Check B12 level

## 2024-03-28 NOTE — Assessment & Plan Note (Signed)
 Chronic Monitored by GYN DEXA up-to-date On Fosamax  70 mg weekly Continue as much activity/exercise as possible Continue calcium and vitamin D  daily Check vitamin D  level

## 2024-04-03 ENCOUNTER — Encounter: Payer: Self-pay | Admitting: Internal Medicine

## 2024-04-04 ENCOUNTER — Other Ambulatory Visit

## 2024-04-04 DIAGNOSIS — R739 Hyperglycemia, unspecified: Secondary | ICD-10-CM

## 2024-04-04 DIAGNOSIS — E538 Deficiency of other specified B group vitamins: Secondary | ICD-10-CM

## 2024-04-04 DIAGNOSIS — E871 Hypo-osmolality and hyponatremia: Secondary | ICD-10-CM

## 2024-04-04 DIAGNOSIS — M810 Age-related osteoporosis without current pathological fracture: Secondary | ICD-10-CM | POA: Diagnosis not present

## 2024-04-04 DIAGNOSIS — E890 Postprocedural hypothyroidism: Secondary | ICD-10-CM

## 2024-04-04 LAB — COMPREHENSIVE METABOLIC PANEL WITH GFR
ALT: 18 U/L (ref 0–35)
AST: 21 U/L (ref 0–37)
Albumin: 4 g/dL (ref 3.5–5.2)
Alkaline Phosphatase: 49 U/L (ref 39–117)
BUN: 13 mg/dL (ref 6–23)
CO2: 27 meq/L (ref 19–32)
Calcium: 8.9 mg/dL (ref 8.4–10.5)
Chloride: 104 meq/L (ref 96–112)
Creatinine, Ser: 0.74 mg/dL (ref 0.40–1.20)
GFR: 78.76 mL/min (ref >60.00)
Glucose, Bld: 88 mg/dL (ref 70–99)
Potassium: 4 meq/L (ref 3.5–5.1)
Sodium: 138 meq/L (ref 135–145)
Total Bilirubin: 0.5 mg/dL (ref 0.2–1.2)
Total Protein: 6.6 g/dL (ref 6.0–8.3)

## 2024-04-04 LAB — CBC WITH DIFFERENTIAL/PLATELET
Basophils Absolute: 0 K/uL (ref 0.0–0.1)
Basophils Relative: 0.5 % (ref 0.0–3.0)
Eosinophils Absolute: 0.2 K/uL (ref 0.0–0.7)
Eosinophils Relative: 4.4 % (ref 0.0–5.0)
HCT: 39.2 % (ref 36.0–46.0)
Hemoglobin: 13.2 g/dL (ref 12.0–15.0)
Lymphocytes Relative: 26.9 % (ref 12.0–46.0)
Lymphs Abs: 1.5 K/uL (ref 0.7–4.0)
MCHC: 33.7 g/dL (ref 30.0–36.0)
MCV: 92.1 fl (ref 78.0–100.0)
Monocytes Absolute: 0.6 K/uL (ref 0.1–1.0)
Monocytes Relative: 11.3 % (ref 3.0–12.0)
Neutro Abs: 3.1 K/uL (ref 1.4–7.7)
Neutrophils Relative %: 56.9 % (ref 43.0–77.0)
Platelets: 266 K/uL (ref 150.0–400.0)
RBC: 4.26 Mil/uL (ref 3.87–5.11)
RDW: 13.3 % (ref 11.5–15.5)
WBC: 5.5 K/uL (ref 4.0–10.5)

## 2024-04-04 LAB — LIPID PANEL
Cholesterol: 162 mg/dL (ref 0–200)
HDL: 53.1 mg/dL (ref >39.00)
LDL Cholesterol: 93 mg/dL (ref 0–99)
NonHDL: 108.77
Total CHOL/HDL Ratio: 3
Triglycerides: 77 mg/dL (ref 0.0–149.0)
VLDL: 15.4 mg/dL (ref 0.0–40.0)

## 2024-04-04 LAB — TSH: TSH: 0.18 u[IU]/mL — ABNORMAL LOW (ref 0.35–5.50)

## 2024-04-04 LAB — T4, FREE: Free T4: 1.22 ng/dL (ref 0.60–1.60)

## 2024-04-04 LAB — HEMOGLOBIN A1C: Hgb A1c MFr Bld: 5.2 % (ref 4.6–6.5)

## 2024-04-04 LAB — VITAMIN B12: Vitamin B-12: 357 pg/mL (ref 211–911)

## 2024-04-04 LAB — VITAMIN D 25 HYDROXY (VIT D DEFICIENCY, FRACTURES): VITD: 52.31 ng/mL (ref 30.00–100.00)

## 2024-04-04 MED ORDER — AMOXICILLIN-POT CLAVULANATE 875-125 MG PO TABS
1.0000 | ORAL_TABLET | Freq: Two times a day (BID) | ORAL | 1 refills | Status: AC
  Filled 2024-04-04: qty 20, 10d supply, fill #0

## 2024-04-05 ENCOUNTER — Other Ambulatory Visit (HOSPITAL_COMMUNITY): Payer: Self-pay

## 2024-04-06 ENCOUNTER — Other Ambulatory Visit (HOSPITAL_COMMUNITY): Payer: Self-pay

## 2024-04-06 ENCOUNTER — Ambulatory Visit: Payer: Self-pay | Admitting: Internal Medicine

## 2024-04-06 DIAGNOSIS — E039 Hypothyroidism, unspecified: Secondary | ICD-10-CM

## 2024-04-06 MED ORDER — LEVOTHYROXINE SODIUM 50 MCG PO TABS
ORAL_TABLET | ORAL | 3 refills | Status: AC
  Filled 2024-04-06: qty 114, 90d supply, fill #0
  Filled 2024-07-03: qty 114, 90d supply, fill #1
  Filled 2024-09-21 (×3): qty 114, 90d supply, fill #2

## 2024-04-10 ENCOUNTER — Ambulatory Visit

## 2024-04-12 ENCOUNTER — Other Ambulatory Visit: Payer: Self-pay

## 2024-04-12 ENCOUNTER — Other Ambulatory Visit (HOSPITAL_COMMUNITY): Payer: Self-pay

## 2024-04-12 ENCOUNTER — Other Ambulatory Visit: Payer: Self-pay | Admitting: Obstetrics & Gynecology

## 2024-04-12 DIAGNOSIS — Z1231 Encounter for screening mammogram for malignant neoplasm of breast: Secondary | ICD-10-CM

## 2024-04-19 ENCOUNTER — Encounter

## 2024-04-25 ENCOUNTER — Encounter

## 2024-04-27 ENCOUNTER — Other Ambulatory Visit (HOSPITAL_COMMUNITY): Payer: Self-pay

## 2024-04-27 DIAGNOSIS — L409 Psoriasis, unspecified: Secondary | ICD-10-CM | POA: Diagnosis not present

## 2024-04-27 DIAGNOSIS — L239 Allergic contact dermatitis, unspecified cause: Secondary | ICD-10-CM | POA: Diagnosis not present

## 2024-04-27 MED ORDER — TRIAMCINOLONE ACETONIDE 0.1 % EX CREA
TOPICAL_CREAM | CUTANEOUS | 0 refills | Status: DC
  Filled 2024-04-27: qty 454, 30d supply, fill #0

## 2024-04-28 ENCOUNTER — Other Ambulatory Visit (HOSPITAL_COMMUNITY): Payer: Self-pay

## 2024-05-01 ENCOUNTER — Other Ambulatory Visit (HOSPITAL_BASED_OUTPATIENT_CLINIC_OR_DEPARTMENT_OTHER): Payer: Self-pay | Admitting: Obstetrics & Gynecology

## 2024-05-01 DIAGNOSIS — E559 Vitamin D deficiency, unspecified: Secondary | ICD-10-CM

## 2024-05-02 ENCOUNTER — Other Ambulatory Visit (HOSPITAL_COMMUNITY): Payer: Self-pay

## 2024-05-02 MED ORDER — VITAMIN D (ERGOCALCIFEROL) 1.25 MG (50000 UNIT) PO CAPS
ORAL_CAPSULE | ORAL | 2 refills | Status: AC
  Filled 2024-05-02 – 2024-06-17 (×2): qty 6, 84d supply, fill #0
  Filled 2024-09-21 (×2): qty 6, 84d supply, fill #1

## 2024-05-03 ENCOUNTER — Encounter

## 2024-05-04 ENCOUNTER — Ambulatory Visit (INDEPENDENT_AMBULATORY_CARE_PROVIDER_SITE_OTHER): Admitting: Otolaryngology

## 2024-05-04 ENCOUNTER — Encounter: Payer: Self-pay | Admitting: Internal Medicine

## 2024-05-04 MED ORDER — CLINDAMYCIN HCL 300 MG PO CAPS: 300.0000 mg | ORAL_CAPSULE | Freq: Three times a day (TID) | ORAL | 0 refills | Status: DC

## 2024-05-04 NOTE — Addendum Note (Signed)
 Addended by: GEOFM GLADE PARAS on: 05/04/2024 07:13 PM   Modules accepted: Orders

## 2024-05-10 ENCOUNTER — Encounter

## 2024-05-16 ENCOUNTER — Other Ambulatory Visit (HOSPITAL_COMMUNITY): Payer: Self-pay

## 2024-05-17 ENCOUNTER — Encounter

## 2024-05-18 DIAGNOSIS — D229 Melanocytic nevi, unspecified: Secondary | ICD-10-CM | POA: Diagnosis not present

## 2024-05-18 DIAGNOSIS — L988 Other specified disorders of the skin and subcutaneous tissue: Secondary | ICD-10-CM | POA: Diagnosis not present

## 2024-05-18 DIAGNOSIS — L814 Other melanin hyperpigmentation: Secondary | ICD-10-CM | POA: Diagnosis not present

## 2024-05-18 DIAGNOSIS — L821 Other seborrheic keratosis: Secondary | ICD-10-CM | POA: Diagnosis not present

## 2024-05-18 DIAGNOSIS — Z85828 Personal history of other malignant neoplasm of skin: Secondary | ICD-10-CM | POA: Diagnosis not present

## 2024-05-18 DIAGNOSIS — C44311 Basal cell carcinoma of skin of nose: Secondary | ICD-10-CM | POA: Diagnosis not present

## 2024-05-18 DIAGNOSIS — D485 Neoplasm of uncertain behavior of skin: Secondary | ICD-10-CM | POA: Diagnosis not present

## 2024-05-18 DIAGNOSIS — L578 Other skin changes due to chronic exposure to nonionizing radiation: Secondary | ICD-10-CM | POA: Diagnosis not present

## 2024-05-18 DIAGNOSIS — D1801 Hemangioma of skin and subcutaneous tissue: Secondary | ICD-10-CM | POA: Diagnosis not present

## 2024-05-18 DIAGNOSIS — L409 Psoriasis, unspecified: Secondary | ICD-10-CM | POA: Diagnosis not present

## 2024-05-19 ENCOUNTER — Other Ambulatory Visit: Payer: Self-pay

## 2024-05-19 ENCOUNTER — Other Ambulatory Visit (HOSPITAL_COMMUNITY): Payer: Self-pay

## 2024-05-19 ENCOUNTER — Encounter: Payer: Self-pay | Admitting: Pediatrics

## 2024-05-19 ENCOUNTER — Encounter: Payer: Self-pay | Admitting: Internal Medicine

## 2024-05-19 DIAGNOSIS — A0472 Enterocolitis due to Clostridium difficile, not specified as recurrent: Secondary | ICD-10-CM

## 2024-05-19 MED ORDER — VANCOMYCIN HCL 125 MG PO CAPS
ORAL_CAPSULE | ORAL | 0 refills | Status: DC
  Filled 2024-05-19: qty 76, 40d supply, fill #0

## 2024-05-19 NOTE — Telephone Encounter (Signed)
 PT called to request to speak with nurse. Stated she has bad diarrhea and that she can bring a stool sample in today. Please advise.

## 2024-05-19 NOTE — Telephone Encounter (Signed)
 Patient had recent C. difficile infection May 2025 treated with vancomycin  for 10 days with general resolution of her symptoms some mild still loose stools. 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  I would love to be able to test that this is a C. difficile however it is a Friday patient is very high risk for reoccurrence with recent infection and antibiotic use.  Will go ahead and treat this as if this is C. difficile reinfection, at this point to potentially stay on the antibiotics and just finish those out and we will start her on the vancomycin  but a longer taper.  -Will do pulse taper of vancomycin  since had initial response at first 125 mg 4 times daily for 12 days, then 125 mg twice daily for 7 days, then 125 mg once daily for 7 days, then 125 mg every other day for 14 days #76 -Start on florastor 250mg  1 cap BID for 1 month -Hand washing, wiping down surfaces, and avoiding contamination discussed with the patient.  -Avoid ABX in the future -Follow up 4 weeks -Follow up if any worsening symptoms or no improving symptoms, ER precautions discussed - Consider KUB

## 2024-05-19 NOTE — Telephone Encounter (Signed)
 Pt made aware of Alan Coombs PA recommendations Pt made aware that prescription was at [facility].  Pt was scheduled for a follow up visit on 06/20/2024 at 8:40 PM. Pt made aware.  Pt verbalized understanding with all questions answered.

## 2024-05-22 ENCOUNTER — Other Ambulatory Visit: Payer: Self-pay

## 2024-05-22 ENCOUNTER — Telehealth: Payer: Self-pay | Admitting: Pediatrics

## 2024-05-22 DIAGNOSIS — R197 Diarrhea, unspecified: Secondary | ICD-10-CM

## 2024-05-22 NOTE — Telephone Encounter (Signed)
 Received a call from patient stating she would like nurse fu call to discuss scheduling and possible testing. Please review and advise  Thank you

## 2024-05-22 NOTE — Telephone Encounter (Signed)
 Spoke with pt. Pt had sent a my chart message in containing her concerns and questions. Pt was made aware  that message has been sent to Dr. Suzann for review. Pt verbalized understanding with all questions answered.

## 2024-05-23 ENCOUNTER — Ambulatory Visit

## 2024-05-23 ENCOUNTER — Other Ambulatory Visit

## 2024-05-23 ENCOUNTER — Encounter

## 2024-05-23 DIAGNOSIS — R197 Diarrhea, unspecified: Secondary | ICD-10-CM | POA: Diagnosis not present

## 2024-05-24 ENCOUNTER — Ambulatory Visit: Admitting: Obstetrics and Gynecology

## 2024-05-24 ENCOUNTER — Ambulatory Visit: Payer: Self-pay | Admitting: Pediatrics

## 2024-05-24 LAB — CLOSTRIDIUM DIFFICILE BY PCR: Toxigenic C. Difficile by PCR: NEGATIVE

## 2024-05-25 DIAGNOSIS — C44311 Basal cell carcinoma of skin of nose: Secondary | ICD-10-CM | POA: Diagnosis not present

## 2024-05-29 ENCOUNTER — Other Ambulatory Visit (HOSPITAL_COMMUNITY): Payer: Self-pay

## 2024-06-13 ENCOUNTER — Ambulatory Visit (INDEPENDENT_AMBULATORY_CARE_PROVIDER_SITE_OTHER): Admitting: Otolaryngology

## 2024-06-17 ENCOUNTER — Other Ambulatory Visit: Payer: Self-pay | Admitting: Internal Medicine

## 2024-06-17 ENCOUNTER — Encounter (HOSPITAL_BASED_OUTPATIENT_CLINIC_OR_DEPARTMENT_OTHER): Payer: Self-pay | Admitting: Obstetrics & Gynecology

## 2024-06-18 ENCOUNTER — Other Ambulatory Visit (HOSPITAL_COMMUNITY): Payer: Self-pay

## 2024-06-19 ENCOUNTER — Ambulatory Visit (INDEPENDENT_AMBULATORY_CARE_PROVIDER_SITE_OTHER): Admitting: Otolaryngology

## 2024-06-19 ENCOUNTER — Other Ambulatory Visit (HOSPITAL_COMMUNITY): Payer: Self-pay

## 2024-06-19 ENCOUNTER — Other Ambulatory Visit: Payer: Self-pay

## 2024-06-19 ENCOUNTER — Ambulatory Visit: Payer: Self-pay | Admitting: Internal Medicine

## 2024-06-19 ENCOUNTER — Encounter (INDEPENDENT_AMBULATORY_CARE_PROVIDER_SITE_OTHER): Payer: Self-pay | Admitting: Otolaryngology

## 2024-06-19 ENCOUNTER — Other Ambulatory Visit (INDEPENDENT_AMBULATORY_CARE_PROVIDER_SITE_OTHER)

## 2024-06-19 VITALS — BP 161/80 | HR 66 | Temp 98.3°F | Ht 60.0 in | Wt 112.0 lb

## 2024-06-19 DIAGNOSIS — Z09 Encounter for follow-up examination after completed treatment for conditions other than malignant neoplasm: Secondary | ICD-10-CM

## 2024-06-19 DIAGNOSIS — E039 Hypothyroidism, unspecified: Secondary | ICD-10-CM | POA: Diagnosis not present

## 2024-06-19 DIAGNOSIS — H60332 Swimmer's ear, left ear: Secondary | ICD-10-CM

## 2024-06-19 DIAGNOSIS — Z9629 Presence of other otological and audiological implants: Secondary | ICD-10-CM | POA: Diagnosis not present

## 2024-06-19 DIAGNOSIS — H6123 Impacted cerumen, bilateral: Secondary | ICD-10-CM

## 2024-06-19 DIAGNOSIS — Z8669 Personal history of other diseases of the nervous system and sense organs: Secondary | ICD-10-CM

## 2024-06-19 DIAGNOSIS — H903 Sensorineural hearing loss, bilateral: Secondary | ICD-10-CM

## 2024-06-19 LAB — TSH: TSH: 1.43 u[IU]/mL (ref 0.35–5.50)

## 2024-06-19 MED ORDER — CYANOCOBALAMIN 1000 MCG/ML IJ SOLN
1000.0000 ug | INTRAMUSCULAR | 1 refills | Status: AC
  Filled 2024-06-19: qty 3, 90d supply, fill #0
  Filled 2024-08-15 – 2024-09-21 (×3): qty 3, 90d supply, fill #1

## 2024-06-19 NOTE — Progress Notes (Unsigned)
 Patient ID: Rock DELENA Lingo, female   DOB: Mar 23, 1946, 78 y.o.   MRN: [id number]  Follow-up: Chronic left otitis externa, bilateral sensorineural hearing loss  HPI: The patient is a 78 year old female who returns today for her follow-up evaluation.  The patient has a history of chronic left ear infection/recurrent otitis externa.  She was treated with Ciprodex  eardrops.  Her temporal bone CT scan showed no significant middle ear or mastoid pathology.  At her last visit in March 2025, no infection was noted.  She was noted to have bilateral high-frequency sensorineural hearing loss.  The patient returns today reporting no more infection or otalgia.  She also denies any otorrhea, change in her hearing, or vertigo.  Exam: General: Communicates without difficulty, well nourished, no acute distress. Head: Normocephalic, no evidence injury, no tenderness, facial buttresses intact without stepoff. Face/sinus: No tenderness to palpation and percussion. Facial movement is normal and symmetric. Eyes: PERRL, EOMI. No scleral icterus, conjunctivae clear. Neuro: CN II exam reveals vision grossly intact.  No nystagmus at any point of gaze. Ears: Auricles well formed without lesions.  Bilateral cerumen impaction.  Nose: External evaluation reveals normal support and skin without lesions.  Dorsum is intact.  Anterior rhinoscopy reveals congested mucosa over anterior aspect of inferior turbinates and intact septum.  No purulence noted. Oral:  Oral cavity and oropharynx are intact, symmetric, without erythema or edema.  Mucosa is moist without lesions. Neck: Full range of motion without pain.  There is no significant lymphadenopathy.  No masses palpable.  Thyroid  bed within normal limits to palpation.  Parotid glands and submandibular glands equal bilaterally without mass.  Trachea is midline. Neuro:  CN 2-12 grossly intact.   Procedure: Bilateral cerumen disimpaction Anesthesia: None Description: Under the operating  microscope, the cerumen is carefully removed with a combination of cerumen currette, alligator forceps, and suction catheters.  After the cerumen is removed, the TMs are noted to be normal.  No mass, erythema, or lesions. The patient tolerated the procedure well.    Assessment: 1.  Bilateral cerumen impaction.  After the cerumen disimpaction procedure, both tympanic membranes and middle ear spaces are noted to be normal. 2.  History of chronic left otitis externa.  No acute infection is noted today. 3.  Subjectively stable bilateral high-frequency sensorineural hearing loss.  Plan: 1.  The physical exam findings are reviewed with the patient. 2.  Otomicroscopy with bilateral cerumen disimpaction. 3.  Continue dry ear precaution on the left side. 4.  Ciprodex  eardrops as needed. 5.  The patient will return for reevaluation in 6 months.

## 2024-06-19 NOTE — Progress Notes (Unsigned)
 06/20/2024 Jo Gomez [id number] 1946-04-24  Referring provider: Geofm Glade PARAS, MD Primary GI doctor: Dr. Suzann  ASSESSMENT AND PLAN:  History of C. difficile with repeated courses of antibiotics for UTI 01/04/2024 CTAP W inflammatory changes cecum concerning for colitis Treated C. difficile vancomycin  12/2023 05/23/2024 C. 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 Likely due to disrupted gut flora. - Restart psyllium husk fiber supplement to add bulk to stools. - Avoid milk products unless lactose-free. - Continue probiotic supplementation. - Provided information on the FODMAP diet to identify potential dietary triggers. - consider trial of xifaxin/SIBO treatment  Constipation/pelvic floor dysfunction History of TAH/BSO, 1 SVD 08/2021 colonoscopy normal other than left-sided diverticulosis recall 2028 Trial of Linzess  72 mcg increased to 145 mcg, discussed suppositories Referral pelvic floor physical therapy Long-standing constipation managed with fiber supplements. Recent symptoms of frequent, urgent bowel movements with incomplete evacuation. - Restart psyllium husk fiber supplement to improve stool bulk and facilitate complete evacuation. - Consider switching to Benefiber or Citrucel if psyllium husk causes excessive gas. - Educated on proper bowel movement techniques to aid in complete evacuation. - Encouraged consideration of pelvic floor physical therapy in the future, especially after the holidays.  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  Elverta returns to the office today for follow-up of constipation and previous C. difficile infection.  Since her last visit she had recurrent symptoms of diarrhea that worried her for C. difficile but her stool studies were negative.  Constipation had improved on Linzess  but then had excessively loose stools when on a higher dose.  Experiencing formed stools but with fecal urgency.  Agree that this could reflect some degree of postinfectious IBS.  Reasonable to augment fiber in diet both for this purpose as well as constipation.  Noted that patient was referred for pelvic floor physical therapy but found the process uncomfortable and has not continued.  Inocente Suzann, MD   Patient Care Team: Geofm Glade PARAS, MD as PCP - General (Internal Medicine) Lonni Slain, MD as PCP - Cardiology (Cardiology) [facility] Octavia Lonni, MD as Consulting Physician (Ophthalmology)  HISTORY OF PRESENT ILLNESS: 78 y.o. female with a past medical history of carotid artery disease, chronic bronchitis, asthma, hypothyroidism, normal pressure hydrocephalus, colloid cyst of the third ventricle, lumbar radiculopathy, osteopenia, psoriasis, IgA deficiency, vitamin B12 deficiency and others listed below presents for evaluation of abnormal bowels after Cdiff.   Last seen in the office 03/03/2024 by Dr. Suzann for C. difficile.  Discussed the use of AI scribe software for clinical note transcription with the patient, who gave verbal consent to proceed.  History of Present Illness   Jo Gomez is a 78 year old female who presents with frequent bowel movements and urgency following a C. diff infection.  She has a history of C. diff infection and was previously on clindamycin . After the infection, she opted for a test which returned negative and did not restart vancomycin  treatment after  May.  She has a lifelong history of constipation and was  previously prescribed Linzess , which initially improved her symptoms. However, after increasing the dosage, she experienced excessively loose stools and discontinued the medication. For the past two months, she has been experiencing frequent bowel movements, approximately four to five times a day, with urgency and occasional accidents if she does not go immediately. The stools are described as thin, long, and soft but formed.  She is not currently on any medications for her bowel issues but has resumed taking her supplements, including a multivitamin and calcium. She used to take psyllium every morning and has restarted it recently. She also consumes lactose-free milk and yogurt daily due to previous gastrointestinal discomfort with regular dairy products.  She attended an initial pelvic floor physical therapy session at [facility] but found the assessment process uncomfortable and did not return for further sessions. She experiences sharp bladder pains daily and has a history of thyroid  medication adjustment due to weight loss, which has since stabilized.  No fever, chills, nausea, vomiting, or significant weight loss recently. No significant gas or bloating currently.      She  reports that she quit smoking about 55 years ago. Her smoking use included cigarettes. She started smoking about 58 years ago. She has a 1.5 pack-year smoking history. She has never used smokeless tobacco. She reports current alcohol use of about 2.0 standard drinks of alcohol per week. She reports that she does not use drugs.  RELEVANT GI HISTORY, IMAGING AND LABS: Results   LABS C. difficile: Negative Thyroid  function: Abnormal (02/2024)  DIAGNOSTIC Colonoscopy: Normal (2023)      CBC    Component Value Date/Time   WBC 5.5 04/04/2024 0952   RBC 4.26 04/04/2024 0952   HGB 13.2 04/04/2024 0952   HGB 13.5 04/15/2020 0956   HCT 39.2 04/04/2024 0952   HCT  42.4 04/15/2020 0956   PLT 266.0 04/04/2024 0952   PLT 242 04/15/2020 0956   MCV 92.1 04/04/2024 0952   MCV 94 04/15/2020 0956   MCH 30.4 09/16/2021 1230   MCHC 33.7 04/04/2024 0952   RDW 13.3 04/04/2024 0952   RDW 13.2 04/15/2020 0956   LYMPHSABS 1.5 04/04/2024 0952   LYMPHSABS 1.1 04/15/2020 0956   MONOABS 0.6 04/04/2024 0952   EOSABS 0.2 04/04/2024 0952   EOSABS 0.2 04/15/2020 0956   BASOSABS 0.0 04/04/2024 0952   BASOSABS 0.0 04/15/2020 0956   Recent Labs    01/06/24 1129 04/04/24 0952  HGB 13.7 13.2    CMP     Component Value Date/Time   NA 138 04/04/2024 0952   K 4.0 04/04/2024 0952   CL 104 04/04/2024 0952   CO2 27 04/04/2024 0952   GLUCOSE 88 04/04/2024 0952   BUN 13 04/04/2024 0952   CREATININE 0.74 04/04/2024 0952   CREATININE 0.86 09/16/2021 1230   CALCIUM 8.9 04/04/2024 0952   PROT 6.6 04/04/2024 0952   ALBUMIN 4.0 04/04/2024 0952   AST 21 04/04/2024 0952   ALT 18 04/04/2024 0952   ALKPHOS 49 04/04/2024 0952   BILITOT 0.5 04/04/2024 0952   GFRNONAA >60 12/31/2020 1052   GFRAA 82 03/26/2008 1042      Latest Ref Rng & Units 04/04/2024    9:52 AM 01/06/2024   11:29 AM 04/15/2023   10:49 AM  Hepatic Function  Total Protein 6.0 - 8.3 g/dL 6.6  6.6  6.9   Albumin 3.5 - 5.2 g/dL 4.0  3.8  4.2   AST 0 - 37 U/L 21  12  33   ALT  0 - 35 U/L 18  21  37   Alk Phosphatase 39 - 117 U/L 49  65  70   Total Bilirubin 0.2 - 1.2 mg/dL 0.5  0.3  0.6       Current Medications:   Current Outpatient Medications (Endocrine & Metabolic):    alendronate  (FOSAMAX ) 70 MG tablet, Take 1 tablet (70 mg total) by mouth every 7 (seven) days. Take first thing in am with 6 oz. Water.  Be upright after taking.  Eat nothing for one hour.   EST ESTROGENS -METHYLTEST HS 0.625-1.25 MG tablet, Take 1 tablet by mouth 3 (three) times a week.   levothyroxine  (SYNTHROID ) 50 MCG tablet, Take 1 tablet by mouth 5 days a week and 2 tablets by mouth 2 days a week.  Take 30 minutes prior to  breakfast.     Current Outpatient Medications (Hematological):    cyanocobalamin  (VITAMIN B12) 1000 MCG/ML injection, Inject 1 mL (1,000 mcg total) into the skin every 30 (thirty) days.  Current Outpatient Medications (Other):    clobetasol  cream (TEMOVATE ) 0.05 %, Apply 1 Application topically 2 (two) times daily.   estradiol  (ESTRACE ) 0.1 MG/GM vaginal cream, Place 0.5 gram nightly for 2 weeks then use twice weekly as directed   linaclotide  (LINZESS ) 145 MCG CAPS capsule, Take 1 capsule (145 mcg total) by mouth daily before breakfast. (Patient taking differently: Take 145 mcg by mouth as needed.)   Tapinarof  (VTAMA ) 1 % CREA, Apply topically daily.   triamcinolone  cream (KENALOG ) 0.1 %, Apply to itchy areas twice a day as directed.   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   Asthma    Depression    GERD (gastroesophageal reflux disease)    diet controlled, no med   History of endoscopy    HSV infection    Hydrocephalus, unspecified type (HCC)    IgA deficiency (HCC)    Low vitamin B12 level    Osteoporosis    Ovarian cyst    TAH/BSO   Plaque psoriasis    Shingles    Thyroid  dysfunction    Allergies:  Allergies  Allergen Reactions   Gammagard S-D Less Iga [Immune Globulin (Human)] Anaphylaxis    Have to have triple washed blood products due to this allergy  pt states   Clarithromycin Other (See Comments)    Severe leg and muscle cramps   Erythromycin Base Nausea And Vomiting   Levaquin [Levofloxacin In D5w] Other (See Comments)    tendonitis   Other Rash and Other (See Comments)    IGA Deficiency - catches a lot of viruses   Sulfonamide  Derivatives Rash   Tetracycline Rash     Surgical History:  She  has a past surgical history that includes broken arm (Right); Total abdominal hysterectomy w/ bilateral salpingoophorectomy (1999); Thyroidectomy (2000); Brow lift (Bilateral, 08/2013); Lumbar disc surgery (10/2020); Colonoscopy; Upper gastrointestinal endoscopy; and Brain surgery (Right, 09/09/2023). Family History:  Her family history includes Arthritis in her mother; COPD in her father; Colon polyps in her father and mother; Heart attack in her mother; Lung cancer in her mother; Osteoporosis in her mother.  REVIEW OF SYSTEMS  :  All other systems reviewed and negative except where noted in the History of Present Illness.  PHYSICAL EXAM: BP 110/60   Pulse 75   Ht 5' (1.524 m)   Wt 118 lb 3.2 oz (53.6 kg)   LMP 08/24/1997   BMI 23.08 kg/m  Physical Exam   GENERAL APPEARANCE: Well nourished, in no apparent distress. HEENT: No cervical lymphadenopathy, unremarkable thyroid , sclerae anicteric, conjunctiva pink. RESPIRATORY: Respiratory effort normal, BS equal bilateral without rales, rhonchi, wheezing. CARDIO: RRR with no MRGs, peripheral pulses intact. ABDOMEN: Soft, non distended, active bowel sounds in all 4 quadrants, no tenderness to palpation, no rebound, no mass appreciated. RECTAL: Declines. MUSCULOSKELETAL: Full ROM, normal gait, without edema. SKIN: Dry, intact without rashes or lesions. No jaundice. NEURO: Alert, oriented, no focal deficits. PSYCH: Cooperative, normal mood and affect.      Alan JONELLE Coombs, PA-C 11:42 AM

## 2024-06-20 ENCOUNTER — Ambulatory Visit: Admitting: Physician Assistant

## 2024-06-20 ENCOUNTER — Encounter: Payer: Self-pay | Admitting: Physician Assistant

## 2024-06-20 VITALS — BP 110/60 | HR 75 | Ht 60.0 in | Wt 118.2 lb

## 2024-06-20 DIAGNOSIS — R109 Unspecified abdominal pain: Secondary | ICD-10-CM | POA: Diagnosis not present

## 2024-06-20 DIAGNOSIS — J42 Unspecified chronic bronchitis: Secondary | ICD-10-CM | POA: Diagnosis not present

## 2024-06-20 DIAGNOSIS — I6523 Occlusion and stenosis of bilateral carotid arteries: Secondary | ICD-10-CM

## 2024-06-20 DIAGNOSIS — K219 Gastro-esophageal reflux disease without esophagitis: Secondary | ICD-10-CM

## 2024-06-20 DIAGNOSIS — R194 Change in bowel habit: Secondary | ICD-10-CM

## 2024-06-20 DIAGNOSIS — K5900 Constipation, unspecified: Secondary | ICD-10-CM

## 2024-06-20 DIAGNOSIS — A0472 Enterocolitis due to Clostridium difficile, not specified as recurrent: Secondary | ICD-10-CM

## 2024-06-20 DIAGNOSIS — D802 Selective deficiency of immunoglobulin A [IgA]: Secondary | ICD-10-CM

## 2024-06-20 NOTE — Patient Instructions (Addendum)
 You may have POST INFECTIOUS IBS OR IRRITABLE BOWEL After an infection or diverticulitis flare your intestines can spasm or be a little bit more sensitive. Try these things below:  Can do BRAT diet versus low FODMAP- see below Try trial off milk/lactose products.  Add fiber like benefiber or citracel once a day Can do trial of IBGard for AB pain EVERY DAY- Take 1-2 capsules once a day for maintence or twice a day during a flare Can take dicyclomine as needed.  if any worsening symptoms like blood in stool, weight loss, please call the office or go to the ER.   Toileting tips to help with your constipation - Drink at least 64-80 ounces of water/liquid per day. - Establish a time to try to move your bowels every day.  For many people, this is after a cup of coffee or after a meal such as breakfast. - Sit all of the way back on the toilet keeping your back fairly straight and while sitting up, try to rest the tops of your forearms on your upper thighs.   - Raising your feet with a step stool/squatty potty can be helpful to improve the angle that allows your stool to pass through the rectum. - Relax the rectum feeling it bulge toward the toilet water.  If you feel your rectum raising toward your body, you are contracting rather than relaxing. - Breathe in and slowly exhale. Belly breath by expanding your belly towards your belly button. Keep belly expanded as you gently direct pressure down and back to the anus.  A low pitched GRRR sound can assist with increasing intra-abdominal pressure.  (Can also trying to blow on a pinwheel and make it move, this helps with the same belly breathing) - Repeat 3-4 times. If unsuccessful, contract the pelvic floor to restore normal tone and get off the toilet.  Avoid excessive straining. - To reduce excessive wiping by teaching your anus to normally contract, place hands on outer aspect of knees and resist knee movement outward.  Hold 5-10 second then place  hands just inside of knees and resist inward movement of knees.  Hold 5 seconds.  Repeat a few times each way.  Go to the ER if unable to pass gas, severe AB pain, unable to hold down food, any shortness of breath of chest pain.    FODMAP stands for fermentable oligo-, di-, mono-saccharides and polyols (1). These are the scientific terms used to classify groups of carbs that are notorious for triggering digestive symptoms like bloating, gas and stomach pain.       Here some information about pelvic floor dysfunction. This may be contributing to some of your symptoms. We will continue with our evaluation but I do want you to consider adding on fiber supplement with low-dose MiraLAX daily. We could also refer to pelvic floor physical therapy.   Pelvic Floor Dysfunction, Female Pelvic floor dysfunction (PFD) is a condition that results when the group of muscles and connective tissues that support the organs in the pelvis (pelvic floor muscles) do not work well. These muscles and their connections form a sling that supports the colon and bladder. In women, they also support the uterus. PFD causes pelvic floor muscles to be too weak, too tight, or both. In PFD, muscle movements are not coordinated. This may cause bowel or bladder problems. It may also cause pain. What are the causes? This condition may be caused by an injury to the pelvic area or by a  weakening of pelvic muscles. This often results from pregnancy and childbirth or other types of strain. In many cases, the exact cause is not known. What increases the risk? The following factors may make you more likely to develop this condition: Having chronic bladder tissue inflammation (interstitial cystitis). Being an older person. Being overweight. History of radiation treatment for cancer in the pelvic region. Previous pelvic surgery, such as removal of the uterus (hysterectomy). What are the signs or symptoms? Symptoms of this  condition vary and may include: Bladder symptoms, such as: Trouble starting urination and emptying the bladder. Frequent urinary tract infections. Leaking urine when coughing, laughing, or exercising (stress incontinence). Having to pass urine urgently or frequently. Pain when passing urine. Bowel symptoms, such as: Constipation. Urgent or frequent bowel movements. Incomplete bowel movements. Painful bowel movements. Leaking stool or gas. Unexplained genital or rectal pain. Genital or rectal muscle spasms. Low back pain. Other symptoms may include: A heavy, full, or aching feeling in the vagina. A bulge that protrudes into the vagina. Pain during or after sex. How is this diagnosed? This condition may be diagnosed based on: Your symptoms and medical history. A physical exam. During the exam, your health care provider may check your pelvic muscles for tightness, spasm, pain, or weakness. This may include a rectal exam and a pelvic exam. In some cases, you may have diagnostic tests, such as: Electrical muscle function tests. Urine flow testing. X-ray tests of bowel function. Ultrasound of the pelvic organs. How is this treated? Treatment for this condition depends on the symptoms. Treatment options include: Physical therapy. This may include Kegel exercises to help relax or strengthen the pelvic floor muscles. Biofeedback. This type of therapy provides feedback on how tight your pelvic floor muscles are so that you can learn to control them. Internal or external massage therapy. A treatment that involves electrical stimulation of the pelvic floor muscles to help control pain (transcutaneous electrical nerve stimulation, or TENS). Sound wave therapy (ultrasound) to reduce muscle spasms. Medicines, such as: Muscle relaxants. Bladder control medicines. Surgery to reconstruct or support pelvic floor muscles may be an option if other treatments do not help. Follow these instructions  at home: Activity Do your usual activities as told by your health care provider. Ask your health care provider if you should modify any activities. Do pelvic floor strengthening or relaxing exercises at home as told by your physical therapist. Lifestyle Maintain a healthy weight. Eat foods that are high in fiber, such as beans, whole grains, and fresh fruits and vegetables. Limit foods that are high in fat and processed sugars, such as fried or sweet foods. Manage stress with relaxation techniques such as yoga or meditation. General instructions If you have problems with leakage: Use absorbable pads or wear padded underwear. Wash frequently with mild soap. Keep your genital and anal area as clean and dry as possible. Ask your health care provider if you should try a barrier cream to prevent skin irritation. Take warm baths to relieve pelvic muscle tension or spasms. Take over-the-counter and prescription medicines only as told by your health care provider. Keep all follow-up visits. How is this prevented? The cause of PFD is not always known, but there are a few things you can do to reduce the risk of developing this condition, including: Staying at a healthy weight. Getting regular exercise. Managing stress. Contact a health care provider if: Your symptoms are not improving with home care. You have signs or symptoms of PFD that get  worse at home. You develop new signs or symptoms. You have signs of a urinary tract infection, such as: Fever. Chills. Increased urinary frequency. A burning feeling when urinating. You have not had a bowel movement in 3 days (constipation). Summary Pelvic floor dysfunction results when the muscles and connective tissues in your pelvic floor do not work well. These muscles and their connections form a sling that supports your colon and bladder. In women, they also support the uterus. PFD may be caused by an injury to the pelvic area or by a weakening  of pelvic muscles. PFD causes pelvic floor muscles to be too weak, too tight, or a combination of both. Symptoms may vary from person to person. In most cases, PFD can be treated with physical therapies and medicines. Surgery may be an option if other treatments do not help. This information is not intended to replace advice given to you by your health care provider. Make sure you discuss any questions you have with your health care provider. Document Revised: 12/18/2020 Document Reviewed: 12/18/2020 Elsevier Patient Education  2022 [employer].

## 2024-06-23 ENCOUNTER — Other Ambulatory Visit (HOSPITAL_COMMUNITY): Payer: Self-pay

## 2024-07-03 ENCOUNTER — Other Ambulatory Visit (HOSPITAL_COMMUNITY): Payer: Self-pay

## 2024-07-03 ENCOUNTER — Encounter: Payer: Self-pay | Admitting: Internal Medicine

## 2024-07-03 ENCOUNTER — Other Ambulatory Visit: Payer: Self-pay

## 2024-07-03 ENCOUNTER — Other Ambulatory Visit (HOSPITAL_BASED_OUTPATIENT_CLINIC_OR_DEPARTMENT_OTHER): Payer: Self-pay | Admitting: Obstetrics & Gynecology

## 2024-07-03 DIAGNOSIS — M85851 Other specified disorders of bone density and structure, right thigh: Secondary | ICD-10-CM

## 2024-07-03 MED ORDER — ALENDRONATE SODIUM 70 MG PO TABS
70.0000 mg | ORAL_TABLET | ORAL | 2 refills | Status: AC
  Filled 2024-07-03: qty 12, 84d supply, fill #0
  Filled 2024-08-15 – 2024-09-21 (×3): qty 12, 84d supply, fill #1

## 2024-07-03 MED ORDER — TRAZODONE HCL 150 MG PO TABS
75.0000 mg | ORAL_TABLET | Freq: Every day | ORAL | 1 refills | Status: AC
  Filled 2024-07-03: qty 45, 90d supply, fill #0
  Filled 2024-08-15 – 2024-09-21 (×3): qty 45, 90d supply, fill #1

## 2024-07-03 NOTE — Telephone Encounter (Signed)
 Patient to have a bone scan 02/2025

## 2024-07-06 ENCOUNTER — Other Ambulatory Visit (HOSPITAL_COMMUNITY): Payer: Self-pay

## 2024-07-07 ENCOUNTER — Other Ambulatory Visit: Payer: Self-pay | Admitting: Medical Genetics

## 2024-07-12 ENCOUNTER — Ambulatory Visit
Admission: RE | Admit: 2024-07-12 | Discharge: 2024-07-12 | Disposition: A | Discharge status: Home / Self Care | Source: Ambulatory Visit | Attending: Obstetrics & Gynecology | Admitting: Obstetrics & Gynecology

## 2024-07-12 DIAGNOSIS — Z1231 Encounter for screening mammogram for malignant neoplasm of breast: Secondary | ICD-10-CM | POA: Diagnosis not present

## 2024-08-03 ENCOUNTER — Other Ambulatory Visit

## 2024-08-03 DIAGNOSIS — Z006 Encounter for examination for normal comparison and control in clinical research program: Secondary | ICD-10-CM

## 2024-08-13 LAB — GENECONNECT MOLECULAR SCREEN

## 2024-08-15 ENCOUNTER — Ambulatory Visit: Payer: Self-pay

## 2024-08-15 ENCOUNTER — Other Ambulatory Visit (HOSPITAL_COMMUNITY): Payer: Self-pay

## 2024-08-15 ENCOUNTER — Encounter: Payer: Self-pay | Admitting: Internal Medicine

## 2024-08-15 ENCOUNTER — Other Ambulatory Visit: Payer: Self-pay | Admitting: Family

## 2024-08-15 MED ORDER — OSELTAMIVIR PHOSPHATE 75 MG PO CAPS
75.0000 mg | ORAL_CAPSULE | Freq: Two times a day (BID) | ORAL | 0 refills | Status: DC
  Filled 2024-08-15: qty 10, 5d supply, fill #0

## 2024-08-15 NOTE — Telephone Encounter (Signed)
 FYI Only or Action Required?: Action required by provider: clinical question for provider and Would like to know if something can be prescribed for the flu. Positive home flu test this morning. Next soonest OV at home office not until 12/26 and pt is leaving that day. Pt would like to be called if something can or can't be prescribed. Please advise..  Patient was last seen in primary care on 03/28/2024 by Geofm Glade PARAS, MD.  Called Nurse Triage reporting Influenza.  Symptoms began yesterday.  Interventions attempted: OTC medications: Tylenol  and Prescription medications: Albuterol .  Symptoms are: gradually worsening.  Triage Disposition: Call PCP Within 24 Hours  Patient/caregiver understands and will follow disposition?: Yes   Yesterday onset of cough with clear yellow mucus, body aches, cough, headache, sinus pressure, central chest pain with coughing and feeling feverish. Has not checked temp. Denies SOB. This morning had a positive flu test. Pt wanting to know if something can be prescribed. 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 PCP currently out of the office, noted to have sent mychart message earlier today with positive flu test results asking if something could be prescribed. Has had flu and cold vaccines this year late in the season. Forwarding request to office. Confirmed pts preferred pharmacy is: [id number] - [place], [place] - [address] AT [facility] OF [address]    Copied from CRM [id number]. Topic: Clinical - Red Word Triage >> Aug 15, 2024 12:27 PM Berneda FALCON wrote: Red Word that prompted transfer to Nurse Triage: Positive for the flu Type-A yesterday, patient would like to know if there is something we can call in for her, because she gets a lung infection. States PCP normally would prescribe her an antibiotic for her to help avoid further health decline (she has underlying  conditions which amplifies the sickness) but PCP is out for the Holidays.  Was going to send as a medication question, but patient mentioned that her chest hurts, and she has been wheezing.  Other symptoms include cough, body aches, sneezing, congestion Reason for Disposition  Patient is HIGH RISK (e.g., age > 64 years, pregnant, HIV+, or chronic medical condition)  Answer Assessment - Initial Assessment Questions 1. WORST SYMPTOM: What is your worst symptom? (e.g., cough, runny nose, muscle aches, headache, sore throat, fever)      General aches and pain and cough  2. ONSET: When did your flu symptoms start?      Yesterday afternoon  3. COUGH: How bad is the cough?       Moderate, productive  4. RESPIRATORY DISTRESS: Describe your breathing.      Denies SOB currently, had some rattling last night  5. FEVER: Do you have a fever? If Yes, ask: What is your temperature, how was it measured, and when did it start?     Felt feverish last night, has not checked temp  6. EXPOSURE: Were you exposed to someone with influenza?       Recently went to a large gathering 3-4 days ago  7. FLU VACCINE: Did you get a flu shot this year?     Yes, got it late in the season  8. HIGH RISK DISEASE: Do you have any chronic medical problems? 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  10. OTHER SYMPTOMS: Do you have any other symptoms?  (e.g., runny nose, muscle aches, headache, sore throat)       Headaches, muscle aches, central CP with coughing  Protocols used: Influenza (Flu) - [facility]

## 2024-08-16 ENCOUNTER — Telehealth

## 2024-08-21 ENCOUNTER — Other Ambulatory Visit: Payer: Self-pay

## 2024-08-21 ENCOUNTER — Other Ambulatory Visit (HOSPITAL_COMMUNITY): Payer: Self-pay

## 2024-08-21 MED ORDER — TRIAMCINOLONE ACETONIDE 0.1 % EX CREA
TOPICAL_CREAM | CUTANEOUS | 0 refills | Status: AC
  Filled 2024-08-21 (×2): qty 454, 30d supply, fill #0

## 2024-08-25 ENCOUNTER — Encounter: Payer: Self-pay | Admitting: Internal Medicine

## 2024-08-25 ENCOUNTER — Other Ambulatory Visit: Payer: Self-pay | Admitting: Family

## 2024-08-25 ENCOUNTER — Other Ambulatory Visit (HOSPITAL_COMMUNITY): Payer: Self-pay

## 2024-08-25 ENCOUNTER — Ambulatory Visit: Payer: Self-pay

## 2024-08-25 MED ORDER — AMOXICILLIN-POT CLAVULANATE 875-125 MG PO TABS
1.0000 | ORAL_TABLET | Freq: Two times a day (BID) | ORAL | 0 refills | Status: DC
  Filled 2024-08-25: qty 20, 10d supply, fill #0

## 2024-08-25 NOTE — Telephone Encounter (Signed)
 FYI Only or Action Required?: FYI only for provider: appointment scheduled on 08/28/2024, would like prescription for antibiotic called in.  Patient was last seen in primary care on 03/28/2024 by Geofm Glade PARAS, MD.  Called Nurse Triage reporting Sinusitis.  Symptoms began a week ago.  Interventions attempted: Other: saline nasal mist.  Symptoms are: gradually worsening.  Triage Disposition: See PCP When Office is Open (Within 3 Days)  Patient/caregiver understands and will follow disposition?: Yes  Copied from CRM [id number]. Topic: Clinical - Red Word Triage >> Aug 25, 2024 12:34 PM Alfonso ORN wrote: Red Word that prompted transfer to Nurse Triage: flu  turned to sinus infection, headache getting worse (message sent to pcp asking for antibiotic) Reason for Disposition  [1] Nasal discharge AND [2] present > 10 days  Answer Assessment - Initial Assessment Questions Patient has amoxicillin  875 BID from 2017 at home and would like to know if she should start using this before the appt.    Please contact patient with recommendations Scheduled for 1/5/206    1. LOCATION: Where does it hurt?      Left eye, left temple, and forehead 2. ONSET: When did the sinus pain start?  (e.g., hours, days)      One week ago 3. SEVERITY: How bad is the pain?   (Scale 0-10; or none, mild, moderate or severe)     5/10 with tylenol . 9/10 without medication 4. RECURRENT SYMPTOM: Have you ever had sinus problems before? If Yes, ask: When was the last time? and What happened that time?      Has history of sinus infections, normally required antibiotics to clear 5. NASAL CONGESTION: Is the nose blocked? If Yes, ask: Can you open it or must you breathe through your mouth?     No congestion, saline spray to manage 6. NASAL DISCHARGE: Do you have discharge from your nose? If so ask, What color?     clear 7. FEVER: Do you have a fever? If Yes, ask: What is it, how was it measured, and when  did it start?      100.4 last night. Has not measured today 8. OTHER SYMPTOMS: Do you have any other symptoms? (e.g., sore throat, cough, earache, difficulty breathing)     Has recovered from recent flu  Protocols used: Sinus Pain or Congestion-[facility]

## 2024-08-28 ENCOUNTER — Ambulatory Visit: Admitting: Internal Medicine

## 2024-08-28 ENCOUNTER — Encounter: Payer: Self-pay | Admitting: Internal Medicine

## 2024-08-28 ENCOUNTER — Other Ambulatory Visit (HOSPITAL_COMMUNITY): Payer: Self-pay

## 2024-08-28 VITALS — BP 120/72 | HR 92 | Temp 98.1°F | Ht 60.0 in | Wt 115.0 lb

## 2024-08-28 DIAGNOSIS — J101 Influenza due to other identified influenza virus with other respiratory manifestations: Secondary | ICD-10-CM | POA: Diagnosis not present

## 2024-08-28 DIAGNOSIS — J0110 Acute frontal sinusitis, unspecified: Secondary | ICD-10-CM

## 2024-08-28 MED ORDER — AMOXICILLIN-POT CLAVULANATE 875-125 MG PO TABS
1.0000 | ORAL_TABLET | Freq: Two times a day (BID) | ORAL | 0 refills | Status: DC
  Filled 2024-08-28 – 2024-08-31 (×2): qty 28, 14d supply, fill #0

## 2024-08-28 NOTE — Assessment & Plan Note (Signed)
 Acute Secondary infection-recently had the flu Likely bacterial Has IgA deficiency Has taken a few days of Augmentin  she had at home-needs extended course due to IgA deficiency Start Augmentin  875-125 mg BID x 14 day-will likely only need for 7-10 days and will keep some antibiotic at home so that she can get her to restart if she has another infection otc cold medications Rest, fluid Call if no improvement

## 2024-08-28 NOTE — Progress Notes (Signed)
 "   Subjective:    Patient ID: Jo Gomez, female    DOB: July 22, 1946, 79 y.o.   MRN: [id number]      HPI Jo Gomez is here for  Chief Complaint  Patient presents with   Sinusitis    Patient had flu that turned into sinus infection; sinus congestion and sinus headache    Current sinus symptoms started Saturday-2 days ago.  Flu A 12/23.  She was supposed to travel 12/26-1/2  Discussed the use of AI scribe software for clinical note transcription with the patient, who gave verbal consent to proceed.  History of Present Illness FAELYN Gomez is a 79 year old female who presents with symptoms following a recent flu infection.  She contracted the flu around Christmas Eve, which lasted for about three days. She was prescribed Tamiflu , which helped her recover quickly, with no pain after three days. She had received the flu vaccine three weeks prior to contracting the flu but is unsure of its effectiveness.  Approximately one week after recovering from the flu, she developed headaches and other symptoms. She experiences severe headaches, primarily located in her forehead and around her eyes, with some relief in the afternoons but worsening at night. She started taking leftover amoxicillin -clavulanate (Augmentin ) from a previous prescription and has taken four tablets so far. The headache was severe enough to almost require tramadol , but she opted for trazodone  to help her sleep instead. She uses nasal saline rinses six to seven times a day. She has noticed discolored mucus and some chest involvement, but no shortness of breath or wheezing currently. She experienced wheezing on the first day of the flu. She has had low-grade fevers ranging from 100.66F to 100.105F, which coincide with feeling unwell. No ear pain, but occasional pressure and a sore throat that has since improved.      Medications and allergies reviewed with patient and updated if appropriate.  Medications Ordered Prior  to Encounter[1]  Review of Systems  Constitutional:  Positive for fever.  HENT:  Positive for congestion, ear pain (occ), postnasal drip and sinus pressure. Negative for sore throat.   Respiratory:  Positive for cough. Negative for shortness of breath and wheezing.   Neurological:  Positive for headaches. Negative for dizziness.       Objective:   Vitals:   08/28/24 1510  BP: 120/72  Pulse: 92  Temp: 98.1 F (36.7 C)  SpO2: 99%   BP Readings from Last 3 Encounters:  08/28/24 120/72  06/20/24 110/60  06/19/24 (!) 161/80   Wt Readings from Last 3 Encounters:  08/28/24 115 lb (52.2 kg)  06/20/24 118 lb 3.2 oz (53.6 kg)  06/19/24 112 lb (50.8 kg)   Body mass index is 22.46 kg/m.    Physical Exam Constitutional:      General: She is not in acute distress.    Appearance: Normal appearance. She is not ill-appearing.  HENT:     Head: Normocephalic and atraumatic.     Right Ear: Tympanic membrane, ear canal and external ear normal.     Left Ear: Tympanic membrane, ear canal and external ear normal.     Mouth/Throat:     Mouth: Mucous membranes are moist.     Pharynx: No oropharyngeal exudate or posterior oropharyngeal erythema.  Eyes:     Conjunctiva/sclera: Conjunctivae normal.  Cardiovascular:     Rate and Rhythm: Normal rate and regular rhythm.  Pulmonary:     Effort: Pulmonary effort is normal. No respiratory distress.  Breath sounds: Normal breath sounds. No wheezing or rales.  Musculoskeletal:     Cervical back: Neck supple. No tenderness.  Lymphadenopathy:     Cervical: No cervical adenopathy.  Skin:    General: Skin is warm and dry.  Neurological:     Mental Status: She is alert.            Assessment & Plan:    See Problem List for Assessment and Plan of chronic medical problems.         [1]  Current Outpatient Medications on File Prior to Visit  Medication Sig Dispense Refill   alendronate  (FOSAMAX ) 70 MG tablet Take 1 tablet (70 mg  total) by mouth every 7 (seven) days. Take first thing in am with 6 oz. Water.  Be upright after taking.  Eat nothing for one hour. 12 tablet 2   amoxicillin -clavulanate (AUGMENTIN ) 875-125 MG tablet Take 1 tablet by mouth 2 (two) times daily. 20 tablet 0   clobetasol  cream (TEMOVATE ) 0.05 % Apply 1 Application topically 2 (two) times daily. 60 g 1   cyanocobalamin  (VITAMIN B12) 1000 MCG/ML injection Inject 1 mL (1,000 mcg total) into the skin every 30 (thirty) days. 3 mL 1   EST ESTROGENS -METHYLTEST HS 0.625-1.25 MG tablet Take 1 tablet by mouth 3 (three) times a week. 36 tablet 1   estradiol  (ESTRACE ) 0.1 MG/GM vaginal cream Place 0.5 gram nightly for 2 weeks then use twice weekly as directed 30 g 11   levothyroxine  (SYNTHROID ) 50 MCG tablet Take 1 tablet by mouth 5 days a week and 2 tablets by mouth 2 days a week.  Take 30 minutes prior to breakfast. 114 tablet 3   linaclotide  (LINZESS ) 145 MCG CAPS capsule Take 1 capsule (145 mcg total) by mouth daily before breakfast. (Patient taking differently: Take 145 mcg by mouth as needed.) 90 capsule 1   Tapinarof  (VTAMA ) 1 % CREA Apply topically daily. 60 g 1   traZODone  (DESYREL ) 150 MG tablet Take 0.5 tablets (75 mg total) by mouth at bedtime. 135 tablet 1   triamcinolone  cream (KENALOG ) 0.1 % Apply to itchy areas twice a day as directed. 454 g 0   TUBERCULIN SYR 1CC/25GX5/8 (ULTICARE TUBERCULIN SAFETY SYR) 25G X 5/8 1 ML MISC Use as directed for monthly B12 injections 12 each 1   Vitamin D , Ergocalciferol , (DRISDOL ) 1.25 MG (50000 UNIT) CAPS capsule TAKE 1 CAPSULE BY MOUTH EVERY OTHER WEEK 6 capsule 2   No current facility-administered medications on file prior to visit.   "

## 2024-08-28 NOTE — Assessment & Plan Note (Signed)
 Resolved Symptoms started 08/15/2024-Home test positive and was prescribed Tamiflu  Symptoms improved, but now has a secondary sinus infection She was unable to travel when she was diagnosed with influenza and she did have a trip planned-Will fill out paperwork for insurance company

## 2024-08-28 NOTE — Patient Instructions (Addendum)
      Medications changes include :   Augmentin twice daily        Return if symptoms worsen or fail to improve.

## 2024-08-30 ENCOUNTER — Other Ambulatory Visit (HOSPITAL_COMMUNITY): Payer: Self-pay

## 2024-08-30 ENCOUNTER — Telehealth: Payer: Self-pay

## 2024-08-30 NOTE — Telephone Encounter (Signed)
 Insurance form completed today and patient made aware it is ready for pick up.

## 2024-08-31 ENCOUNTER — Other Ambulatory Visit (HOSPITAL_COMMUNITY): Payer: Self-pay

## 2024-08-31 ENCOUNTER — Ambulatory Visit: Admitting: Internal Medicine

## 2024-09-04 ENCOUNTER — Encounter: Payer: Self-pay | Admitting: *Deleted

## 2024-09-04 ENCOUNTER — Encounter: Payer: Self-pay | Admitting: Internal Medicine

## 2024-09-04 ENCOUNTER — Other Ambulatory Visit (HOSPITAL_COMMUNITY): Payer: Self-pay

## 2024-09-04 ENCOUNTER — Other Ambulatory Visit (HOSPITAL_BASED_OUTPATIENT_CLINIC_OR_DEPARTMENT_OTHER): Payer: Self-pay | Admitting: Obstetrics & Gynecology

## 2024-09-04 DIAGNOSIS — Z9229 Personal history of other drug therapy: Secondary | ICD-10-CM

## 2024-09-04 MED ORDER — CLINDAMYCIN HCL 300 MG PO CAPS
300.0000 mg | ORAL_CAPSULE | Freq: Three times a day (TID) | ORAL | 0 refills | Status: AC
  Filled 2024-09-04: qty 30, 10d supply, fill #0

## 2024-09-05 ENCOUNTER — Other Ambulatory Visit (HOSPITAL_COMMUNITY): Payer: Self-pay

## 2024-09-20 ENCOUNTER — Encounter: Payer: Self-pay | Admitting: Internal Medicine

## 2024-09-20 ENCOUNTER — Other Ambulatory Visit (HOSPITAL_COMMUNITY): Payer: Self-pay

## 2024-09-20 ENCOUNTER — Ambulatory Visit: Payer: Self-pay

## 2024-09-20 NOTE — Progress Notes (Unsigned)
 "   Subjective:    Patient ID: Jo Gomez, female    DOB: 22-Oct-1945, 79 y.o.   MRN: [id number]      HPI Jo Gomez is here for No chief complaint on file.   Chronic sinus infection-she had the flu in December and it seemed to transition into sinus infection.  She started on Augmentin  x 10 days and that did not seem to be working well so we changed her to clarithromycin, which we stopped because of headaches.  Allergies: Levaquin, sulfa, tetracycline, clarithromycin, erythromycin     Medications and allergies reviewed with patient and updated if appropriate.  Medications Ordered Prior to Encounter[1]  Review of Systems     Objective:  There were no vitals filed for this visit. BP Readings from Last 3 Encounters:  08/28/24 120/72  06/20/24 110/60  06/19/24 (!) 161/80   Wt Readings from Last 3 Encounters:  08/28/24 115 lb (52.2 kg)  06/20/24 118 lb 3.2 oz (53.6 kg)  06/19/24 112 lb (50.8 kg)   There is no height or weight on file to calculate BMI.    Physical Exam Constitutional:      General: She is not in acute distress.    Appearance: Normal appearance. She is not ill-appearing.  HENT:     Head: Normocephalic and atraumatic.     Right Ear: Tympanic membrane, ear canal and external ear normal.     Left Ear: Tympanic membrane, ear canal and external ear normal.     Mouth/Throat:     Mouth: Mucous membranes are moist.     Pharynx: No oropharyngeal exudate or posterior oropharyngeal erythema.  Eyes:     Conjunctiva/sclera: Conjunctivae normal.  Cardiovascular:     Rate and Rhythm: Normal rate and regular rhythm.  Pulmonary:     Effort: Pulmonary effort is normal. No respiratory distress.     Breath sounds: Normal breath sounds. No wheezing or rales.  Musculoskeletal:     Cervical back: Neck supple. No tenderness.  Lymphadenopathy:     Cervical: No cervical adenopathy.  Skin:    General: Skin is warm and dry.  Neurological:     Mental Status: She is  alert.            Assessment & Plan:    See Problem List for Assessment and Plan of chronic medical problems.         [1]  Current Outpatient Medications on File Prior to Visit  Medication Sig Dispense Refill   alendronate  (FOSAMAX ) 70 MG tablet Take 1 tablet (70 mg total) by mouth every 7 (seven) days. Take first thing in am with 6 oz. Water.  Be upright after taking.  Eat nothing for one hour. 12 tablet 2   clindamycin  (CLEOCIN ) 300 MG capsule Take 1 capsule (300 mg total) by mouth 3 (three) times daily. 30 capsule 0   clobetasol  cream (TEMOVATE ) 0.05 % Apply 1 Application topically 2 (two) times daily. 60 g 1   cyanocobalamin  (VITAMIN B12) 1000 MCG/ML injection Inject 1 mL (1,000 mcg total) into the skin every 30 (thirty) days. 3 mL 1   Est Estrogens -Methyltest HS 0.625-1.25 MG TABS TAKE 1 TABLET BY MOUTH 3 TIMES A WEEK 36 tablet 0   estradiol  (ESTRACE ) 0.1 MG/GM vaginal cream Place 0.5 gram nightly for 2 weeks then use twice weekly as directed 30 g 11   levothyroxine  (SYNTHROID ) 50 MCG tablet Take 1 tablet by mouth 5 days a week and 2 tablets by mouth 2 days a  week.  Take 30 minutes prior to breakfast. 114 tablet 3   linaclotide  (LINZESS ) 145 MCG CAPS capsule Take 1 capsule (145 mcg total) by mouth daily before breakfast. (Patient taking differently: Take 145 mcg by mouth as needed.) 90 capsule 1   Tapinarof  (VTAMA ) 1 % CREA Apply topically daily. 60 g 1   traZODone  (DESYREL ) 150 MG tablet Take 0.5 tablets (75 mg total) by mouth at bedtime. 135 tablet 1   triamcinolone  cream (KENALOG ) 0.1 % Apply to itchy areas twice a day as directed. 454 g 0   TUBERCULIN SYR 1CC/25GX5/8 (ULTICARE TUBERCULIN SAFETY SYR) 25G X 5/8 1 ML MISC Use as directed for monthly B12 injections 12 each 1   Vitamin D , Ergocalciferol , (DRISDOL ) 1.25 MG (50000 UNIT) CAPS capsule TAKE 1 CAPSULE BY MOUTH EVERY OTHER WEEK 6 capsule 2   No current facility-administered medications on file prior to visit.   "

## 2024-09-20 NOTE — Patient Instructions (Incomplete)
" ° ° ° ° °  Blood work was ordered.       Medications changes include :   None    A CT scan of your sinuses was ordered and someone will call you to schedule an appointment.   You can also call to schedule the appointment-[facility] - [phone]     No follow-ups on file.  "

## 2024-09-20 NOTE — Telephone Encounter (Signed)
 FYI Only or Action Required?: FYI only for provider: appointment scheduled on 09/22/23.  Patient was last seen in primary care on 08/28/2024 by Geofm Glade PARAS, MD.  Called Nurse Triage reporting Sinusitis.  Symptoms began several weeks ago.  Interventions attempted: OTC medications: Tylenol  .  Symptoms are: unchanged.  Triage Disposition: See PCP When Office is Open (Within 3 Days)  Patient/caregiver understands and will follow disposition?: Yes  Message from Alfonso ORN sent at 09/20/2024  4:40 PM EST  Reason for Triage: sinus symptoms came back after completing medication from provider , coughing up green phlegm   Reason for Disposition  [1] Taking antibiotic > 7 days AND [2] nasal discharge not improved  Answer Assessment - Initial Assessment Questions Pt was seen on 08/28/24 for sinusitis and given Augmentin , then got switched to clindamycin  d/t sx not better. Pt felt good for 1 week and then sx are back now. Pt says she has sinus HA and coughing up green mucus. Only taking Tylenol  currently.   1. ANTIBIOTIC: What antibiotic are you taking? How many times a day?     Was taking Augmentin  and then switched to clindamycin   2. ONSET: When was the antibiotic started?     Completed clindamycin  last week  3. PAIN: How bad is the pain?   (Scale 0-10; or none, mild, moderate or severe)     4/10 4. FEVER: Do you have a fever? If Yes, ask: What is it, how was it measured, and when did it start?      no 5. SYMPTOMS: Are there any other symptoms you're concerned about? If Yes, ask: When did it start?     Sinus HA in front  Protocols used: Sinus Infection on Antibiotic Follow-up Call-[facility]

## 2024-09-21 ENCOUNTER — Ambulatory Visit: Admitting: Internal Medicine

## 2024-09-21 ENCOUNTER — Other Ambulatory Visit (HOSPITAL_COMMUNITY): Payer: Self-pay

## 2024-09-21 VITALS — BP 122/66 | HR 71 | Temp 98.8°F | Ht 60.0 in | Wt 115.0 lb

## 2024-09-21 DIAGNOSIS — J0110 Acute frontal sinusitis, unspecified: Secondary | ICD-10-CM

## 2024-09-21 MED ORDER — CEFPODOXIME PROXETIL 200 MG PO TABS
200.0000 mg | ORAL_TABLET | Freq: Two times a day (BID) | ORAL | 0 refills | Status: AC
  Filled 2024-09-21: qty 28, 14d supply, fill #0

## 2024-12-18 ENCOUNTER — Ambulatory Visit (INDEPENDENT_AMBULATORY_CARE_PROVIDER_SITE_OTHER): Admitting: Otolaryngology

## 2025-03-28 ENCOUNTER — Ambulatory Visit

## 2025-03-30 ENCOUNTER — Encounter: Admitting: Internal Medicine
